# Patient Record
Sex: Male | Born: 1935 | Race: White | Hispanic: No | Smoking: Former smoker
Health system: Southern US, Community
[De-identification: ages and names within clinical notes are randomized; demographics above are authoritative.]

## PROBLEM LIST (undated history)

## (undated) DIAGNOSIS — H9190 Unspecified hearing loss, unspecified ear: Secondary | ICD-10-CM

## (undated) DIAGNOSIS — T182XXA Foreign body in stomach, initial encounter: Secondary | ICD-10-CM

## (undated) DIAGNOSIS — K219 Gastro-esophageal reflux disease without esophagitis: Secondary | ICD-10-CM

## (undated) DIAGNOSIS — E43 Unspecified severe protein-calorie malnutrition: Secondary | ICD-10-CM

## (undated) DIAGNOSIS — I429 Cardiomyopathy, unspecified: Secondary | ICD-10-CM

## (undated) DIAGNOSIS — Z86010 Personal history of colonic polyps: Secondary | ICD-10-CM

## (undated) DIAGNOSIS — I4891 Unspecified atrial fibrillation: Secondary | ICD-10-CM

## (undated) DIAGNOSIS — G250 Essential tremor: Secondary | ICD-10-CM

## (undated) DIAGNOSIS — K2990 Gastroduodenitis, unspecified, without bleeding: Secondary | ICD-10-CM

## (undated) DIAGNOSIS — K5730 Diverticulosis of large intestine without perforation or abscess without bleeding: Secondary | ICD-10-CM

## (undated) DIAGNOSIS — I428 Other cardiomyopathies: Secondary | ICD-10-CM

## (undated) DIAGNOSIS — E785 Hyperlipidemia, unspecified: Secondary | ICD-10-CM

## (undated) DIAGNOSIS — R198 Other specified symptoms and signs involving the digestive system and abdomen: Secondary | ICD-10-CM

## (undated) DIAGNOSIS — I10 Essential (primary) hypertension: Secondary | ICD-10-CM

## (undated) DIAGNOSIS — K561 Intussusception: Secondary | ICD-10-CM

## (undated) DIAGNOSIS — G252 Other specified forms of tremor: Secondary | ICD-10-CM

## (undated) DIAGNOSIS — N182 Chronic kidney disease, stage 2 (mild): Secondary | ICD-10-CM

## (undated) DIAGNOSIS — K2970 Gastritis, unspecified, without bleeding: Secondary | ICD-10-CM

## (undated) DIAGNOSIS — Z5189 Encounter for other specified aftercare: Secondary | ICD-10-CM

## (undated) DIAGNOSIS — K589 Irritable bowel syndrome without diarrhea: Secondary | ICD-10-CM

## (undated) DIAGNOSIS — R197 Diarrhea, unspecified: Secondary | ICD-10-CM

## (undated) DIAGNOSIS — Z87891 Personal history of nicotine dependence: Secondary | ICD-10-CM

## (undated) DIAGNOSIS — K2270 Barrett's esophagus without dysplasia: Secondary | ICD-10-CM

## (undated) DIAGNOSIS — I5042 Chronic combined systolic (congestive) and diastolic (congestive) heart failure: Secondary | ICD-10-CM

## (undated) DIAGNOSIS — K449 Diaphragmatic hernia without obstruction or gangrene: Principal | ICD-10-CM

## (undated) DIAGNOSIS — K921 Melena: Secondary | ICD-10-CM

## (undated) DIAGNOSIS — S32020A Wedge compression fracture of second lumbar vertebra, initial encounter for closed fracture: Secondary | ICD-10-CM

## (undated) DIAGNOSIS — E039 Hypothyroidism, unspecified: Secondary | ICD-10-CM

## (undated) DIAGNOSIS — J690 Pneumonitis due to inhalation of food and vomit: Secondary | ICD-10-CM

## (undated) DIAGNOSIS — E876 Hypokalemia: Secondary | ICD-10-CM

## (undated) DIAGNOSIS — B354 Tinea corporis: Secondary | ICD-10-CM

## (undated) HISTORY — DX: Cardiomyopathy, unspecified: I42.9

## (undated) HISTORY — DX: Hypomagnesemia: E83.42

## (undated) HISTORY — DX: Pneumonitis due to inhalation of food and vomit: J69.0

## (undated) HISTORY — DX: Unspecified severe protein-calorie malnutrition: E43

## (undated) HISTORY — DX: Melena: K92.1

## (undated) HISTORY — DX: Hypokalemia: E87.6

## (undated) HISTORY — DX: Other specified forms of tremor: G25.2

## (undated) HISTORY — DX: Diarrhea, unspecified: R19.7

## (undated) HISTORY — DX: Personal history of colonic polyps: Z86.010

## (undated) HISTORY — PX: HERNIA REPAIR: SHX51

## (undated) HISTORY — PX: TONSILLECTOMY: SUR1361

## (undated) HISTORY — DX: Other specified symptoms and signs involving the digestive system and abdomen: R19.8

## (undated) HISTORY — DX: Unspecified atrial fibrillation: I48.91

## (undated) HISTORY — DX: Tinea corporis: B35.4

## (undated) HISTORY — DX: Gastritis, unspecified, without bleeding: K29.70

## (undated) HISTORY — DX: Chronic kidney disease, stage 2 (mild): N18.2

## (undated) HISTORY — DX: Gastro-esophageal reflux disease without esophagitis: K21.9

## (undated) HISTORY — DX: Hyperlipidemia, unspecified: E78.5

## (undated) HISTORY — DX: Wedge compression fracture of second lumbar vertebra, initial encounter for closed fracture: S32.020A

## (undated) HISTORY — DX: Diaphragmatic hernia without obstruction or gangrene: K44.9

## (undated) HISTORY — DX: Encounter for other specified aftercare: Z51.89

## (undated) HISTORY — DX: Intussusception: K56.1

## (undated) HISTORY — DX: Irritable bowel syndrome, unspecified: K58.9

## (undated) HISTORY — DX: Gastroduodenitis, unspecified, without bleeding: K29.90

## (undated) HISTORY — DX: Essential (primary) hypertension: I10

## (undated) HISTORY — DX: Essential tremor: G25.0

## (undated) HISTORY — DX: Diverticulosis of large intestine without perforation or abscess without bleeding: K57.30

## (undated) HISTORY — DX: Personal history of nicotine dependence: Z87.891

## (undated) HISTORY — PX: UMBILICAL HERNIA REPAIR: SHX196

## (undated) HISTORY — PX: CHOLECYSTECTOMY: SHX55

## (undated) HISTORY — DX: Unspecified hearing loss, unspecified ear: H91.90

## (undated) HISTORY — DX: Barrett's esophagus without dysplasia: K22.70

## (undated) HISTORY — PX: VASECTOMY: SHX75

## (undated) HISTORY — DX: Foreign body in stomach, initial encounter: T18.2XXA

## (undated) HISTORY — DX: Hypothyroidism, unspecified: E03.9

---

## 1982-03-26 HISTORY — PX: LUNG SURGERY: SHX703

## 1997-07-08 ENCOUNTER — Other Ambulatory Visit: Admission: RE | Admit: 1997-07-08 | Discharge: 1997-07-08 | Payer: Self-pay | Admitting: *Deleted

## 1997-10-14 ENCOUNTER — Other Ambulatory Visit: Admission: RE | Admit: 1997-10-14 | Discharge: 1997-10-14 | Payer: Self-pay | Admitting: *Deleted

## 1999-02-13 ENCOUNTER — Other Ambulatory Visit: Admission: RE | Admit: 1999-02-13 | Discharge: 1999-02-13 | Payer: Self-pay | Admitting: Gastroenterology

## 1999-02-13 ENCOUNTER — Encounter (INDEPENDENT_AMBULATORY_CARE_PROVIDER_SITE_OTHER): Payer: Self-pay

## 2001-08-25 ENCOUNTER — Ambulatory Visit (HOSPITAL_COMMUNITY): Admission: RE | Admit: 2001-08-25 | Discharge: 2001-08-25 | Payer: Self-pay | Admitting: *Deleted

## 2002-09-02 ENCOUNTER — Ambulatory Visit (HOSPITAL_COMMUNITY): Admission: RE | Admit: 2002-09-02 | Discharge: 2002-09-02 | Payer: Self-pay | Admitting: Family Medicine

## 2002-09-02 ENCOUNTER — Encounter: Payer: Self-pay | Admitting: Family Medicine

## 2004-02-24 ENCOUNTER — Ambulatory Visit: Payer: Self-pay | Admitting: Gastroenterology

## 2004-03-13 ENCOUNTER — Ambulatory Visit: Payer: Self-pay | Admitting: Gastroenterology

## 2004-11-09 ENCOUNTER — Ambulatory Visit: Payer: Self-pay | Admitting: Cardiology

## 2005-01-25 ENCOUNTER — Ambulatory Visit: Payer: Self-pay | Admitting: Gastroenterology

## 2005-03-02 ENCOUNTER — Ambulatory Visit: Payer: Self-pay | Admitting: Gastroenterology

## 2006-01-15 ENCOUNTER — Ambulatory Visit: Payer: Self-pay | Admitting: Gastroenterology

## 2006-02-18 ENCOUNTER — Ambulatory Visit: Payer: Self-pay | Admitting: Gastroenterology

## 2006-02-18 ENCOUNTER — Encounter (INDEPENDENT_AMBULATORY_CARE_PROVIDER_SITE_OTHER): Payer: Self-pay | Admitting: Specialist

## 2006-03-25 ENCOUNTER — Ambulatory Visit: Payer: Self-pay | Admitting: Gastroenterology

## 2006-08-01 ENCOUNTER — Inpatient Hospital Stay (HOSPITAL_COMMUNITY): Admission: EM | Admit: 2006-08-01 | Discharge: 2006-08-07 | Payer: Self-pay | Admitting: Family Medicine

## 2006-08-01 ENCOUNTER — Ambulatory Visit: Payer: Self-pay | Admitting: Family Medicine

## 2007-02-05 ENCOUNTER — Ambulatory Visit: Payer: Self-pay | Admitting: Gastroenterology

## 2007-02-05 ENCOUNTER — Encounter: Payer: Self-pay | Admitting: Gastroenterology

## 2007-10-16 ENCOUNTER — Telehealth: Payer: Self-pay | Admitting: Gastroenterology

## 2007-11-19 DIAGNOSIS — K2970 Gastritis, unspecified, without bleeding: Secondary | ICD-10-CM

## 2007-11-19 DIAGNOSIS — K449 Diaphragmatic hernia without obstruction or gangrene: Secondary | ICD-10-CM

## 2007-11-19 DIAGNOSIS — K2990 Gastroduodenitis, unspecified, without bleeding: Secondary | ICD-10-CM

## 2007-11-19 DIAGNOSIS — K2270 Barrett's esophagus without dysplasia: Secondary | ICD-10-CM

## 2007-11-19 DIAGNOSIS — K219 Gastro-esophageal reflux disease without esophagitis: Secondary | ICD-10-CM

## 2007-11-19 DIAGNOSIS — K5730 Diverticulosis of large intestine without perforation or abscess without bleeding: Secondary | ICD-10-CM

## 2007-11-19 HISTORY — DX: Diverticulosis of large intestine without perforation or abscess without bleeding: K57.30

## 2007-11-19 HISTORY — DX: Gastro-esophageal reflux disease without esophagitis: K21.9

## 2007-11-19 HISTORY — DX: Barrett's esophagus without dysplasia: K22.70

## 2007-11-19 HISTORY — DX: Gastritis, unspecified, without bleeding: K29.70

## 2007-11-19 HISTORY — DX: Diaphragmatic hernia without obstruction or gangrene: K44.9

## 2007-11-19 HISTORY — DX: Gastroduodenitis, unspecified, without bleeding: K29.90

## 2007-11-20 ENCOUNTER — Ambulatory Visit: Payer: Self-pay | Admitting: Gastroenterology

## 2008-02-09 ENCOUNTER — Ambulatory Visit: Payer: Self-pay | Admitting: Internal Medicine

## 2008-02-09 LAB — CONVERTED CEMR LAB
CO2: 30 meq/L (ref 19–32)
Chloride: 104 meq/L (ref 96–112)
Creatinine, Ser: 1.5 mg/dL (ref 0.4–1.5)
Glucose, Bld: 95 mg/dL (ref 70–99)
TSH: 2.11 microintl units/mL (ref 0.35–5.50)

## 2008-02-20 ENCOUNTER — Encounter: Payer: Self-pay | Admitting: Internal Medicine

## 2008-02-20 ENCOUNTER — Ambulatory Visit: Payer: Self-pay

## 2008-04-05 ENCOUNTER — Ambulatory Visit: Payer: Self-pay | Admitting: Internal Medicine

## 2008-04-05 ENCOUNTER — Ambulatory Visit: Payer: Self-pay

## 2008-04-05 LAB — CONVERTED CEMR LAB
AST: 19 units/L (ref 0–37)
Alkaline Phosphatase: 86 units/L (ref 39–117)
Basophils Absolute: 0.1 10*3/uL (ref 0.0–0.1)
Chloride: 109 meq/L (ref 96–112)
Eosinophils Absolute: 0.4 10*3/uL (ref 0.0–0.7)
Eosinophils Relative: 5.5 % — ABNORMAL HIGH (ref 0.0–5.0)
GFR calc non Af Amer: 61 mL/min
HDL: 28.9 mg/dL — ABNORMAL LOW (ref >39.0)
LDL Cholesterol: 97 mg/dL (ref 0–99)
Lymphocytes Relative: 22.2 % (ref 12.0–46.0)
MCHC: 34.4 g/dL (ref 30.0–36.0)
MCV: 94.7 fL (ref 78.0–100.0)
Neutrophils Relative %: 60.4 % (ref 43.0–77.0)
Platelets: 153 10*3/uL (ref 150–400)
Potassium: 4.6 meq/L (ref 3.5–5.1)
Total Bilirubin: 1 mg/dL (ref 0.3–1.2)
VLDL: 22 mg/dL (ref 0–40)
WBC: 7.9 10*3/uL (ref 4.5–10.5)

## 2008-05-06 ENCOUNTER — Ambulatory Visit: Payer: Self-pay | Admitting: Internal Medicine

## 2008-05-18 ENCOUNTER — Ambulatory Visit: Payer: Self-pay | Admitting: Internal Medicine

## 2008-05-18 ENCOUNTER — Ambulatory Visit: Payer: Self-pay

## 2008-05-18 LAB — CONVERTED CEMR LAB
BUN: 20 mg/dL (ref 6–23)
CO2: 31 meq/L (ref 19–32)
Chloride: 105 meq/L (ref 96–112)
Creatinine, Ser: 1.1 mg/dL (ref 0.4–1.5)
Glucose, Bld: 79 mg/dL (ref 70–99)

## 2008-07-01 ENCOUNTER — Ambulatory Visit (HOSPITAL_COMMUNITY): Admission: RE | Admit: 2008-07-01 | Discharge: 2008-07-02 | Payer: Self-pay | Admitting: Surgery

## 2008-07-01 ENCOUNTER — Ambulatory Visit: Payer: Self-pay | Admitting: Internal Medicine

## 2008-07-23 ENCOUNTER — Encounter: Payer: Self-pay | Admitting: Internal Medicine

## 2008-08-02 ENCOUNTER — Ambulatory Visit: Payer: Self-pay | Admitting: Internal Medicine

## 2008-08-02 DIAGNOSIS — I5022 Chronic systolic (congestive) heart failure: Secondary | ICD-10-CM

## 2008-08-10 ENCOUNTER — Ambulatory Visit: Payer: Self-pay | Admitting: Internal Medicine

## 2008-08-10 ENCOUNTER — Telehealth (INDEPENDENT_AMBULATORY_CARE_PROVIDER_SITE_OTHER): Payer: Self-pay | Admitting: *Deleted

## 2008-08-10 DIAGNOSIS — I5043 Acute on chronic combined systolic (congestive) and diastolic (congestive) heart failure: Secondary | ICD-10-CM | POA: Insufficient documentation

## 2008-08-11 ENCOUNTER — Encounter: Payer: Self-pay | Admitting: Internal Medicine

## 2008-08-11 LAB — CONVERTED CEMR LAB
CO2: 33 meq/L — ABNORMAL HIGH (ref 19–32)
Calcium: 9.6 mg/dL (ref 8.4–10.5)
Creatinine, Ser: 1.3 mg/dL (ref 0.4–1.5)
Glucose, Bld: 113 mg/dL — ABNORMAL HIGH (ref 70–99)
HDL: 28.3 mg/dL — ABNORMAL LOW (ref >39.00)
Pro B Natriuretic peptide (BNP): 45 pg/mL (ref 0.0–100.0)
Sodium: 143 meq/L (ref 135–145)
Total CHOL/HDL Ratio: 6

## 2008-08-12 ENCOUNTER — Ambulatory Visit: Payer: Self-pay | Admitting: Internal Medicine

## 2008-08-12 ENCOUNTER — Telehealth: Payer: Self-pay | Admitting: Internal Medicine

## 2008-08-13 LAB — CONVERTED CEMR LAB
Chloride: 103 meq/L (ref 96–112)
Creatinine, Ser: 1.3 mg/dL (ref 0.4–1.5)

## 2008-10-07 ENCOUNTER — Telehealth: Payer: Self-pay | Admitting: Internal Medicine

## 2008-10-07 ENCOUNTER — Ambulatory Visit: Payer: Self-pay | Admitting: Internal Medicine

## 2008-10-08 ENCOUNTER — Ambulatory Visit: Payer: Self-pay | Admitting: Internal Medicine

## 2008-10-11 ENCOUNTER — Telehealth (INDEPENDENT_AMBULATORY_CARE_PROVIDER_SITE_OTHER): Payer: Self-pay | Admitting: *Deleted

## 2008-10-15 ENCOUNTER — Encounter: Payer: Self-pay | Admitting: Internal Medicine

## 2008-10-15 LAB — CONVERTED CEMR LAB
AST: 15 units/L (ref 0–37)
Cholesterol: 122 mg/dL (ref 0–200)
VLDL: 16.6 mg/dL (ref 0.0–40.0)

## 2008-12-24 ENCOUNTER — Telehealth: Payer: Self-pay | Admitting: Internal Medicine

## 2008-12-27 ENCOUNTER — Ambulatory Visit: Payer: Self-pay | Admitting: Internal Medicine

## 2009-01-04 LAB — CONVERTED CEMR LAB
Albumin: 3.4 g/dL — ABNORMAL LOW (ref 3.5–5.2)
Alkaline Phosphatase: 108 units/L (ref 39–117)
HDL: 23.1 mg/dL — ABNORMAL LOW (ref >39.00)
LDL Cholesterol: 61 mg/dL (ref 0–99)
Total CHOL/HDL Ratio: 5
Triglycerides: 126 mg/dL (ref 0.0–149.0)

## 2009-01-05 ENCOUNTER — Telehealth: Payer: Self-pay | Admitting: Internal Medicine

## 2009-01-11 ENCOUNTER — Encounter: Payer: Self-pay | Admitting: Internal Medicine

## 2009-01-31 ENCOUNTER — Ambulatory Visit: Payer: Self-pay | Admitting: Internal Medicine

## 2009-01-31 ENCOUNTER — Telehealth: Payer: Self-pay | Admitting: Gastroenterology

## 2009-01-31 DIAGNOSIS — R197 Diarrhea, unspecified: Secondary | ICD-10-CM

## 2009-01-31 HISTORY — DX: Diarrhea, unspecified: R19.7

## 2009-02-01 ENCOUNTER — Ambulatory Visit: Payer: Self-pay | Admitting: Internal Medicine

## 2009-02-01 ENCOUNTER — Encounter: Payer: Self-pay | Admitting: Gastroenterology

## 2009-02-01 DIAGNOSIS — R198 Other specified symptoms and signs involving the digestive system and abdomen: Secondary | ICD-10-CM

## 2009-02-01 DIAGNOSIS — Z860100 Personal history of colon polyps, unspecified: Secondary | ICD-10-CM | POA: Insufficient documentation

## 2009-02-01 HISTORY — DX: Personal history of colonic polyps: Z86.010

## 2009-02-01 HISTORY — DX: Personal history of colon polyps, unspecified: Z86.0100

## 2009-02-01 HISTORY — DX: Other specified symptoms and signs involving the digestive system and abdomen: R19.8

## 2009-02-07 ENCOUNTER — Telehealth: Payer: Self-pay | Admitting: Nurse Practitioner

## 2009-02-21 ENCOUNTER — Ambulatory Visit: Payer: Self-pay | Admitting: Gastroenterology

## 2009-02-25 ENCOUNTER — Encounter: Payer: Self-pay | Admitting: Gastroenterology

## 2009-02-28 ENCOUNTER — Telehealth: Payer: Self-pay | Admitting: Gastroenterology

## 2009-03-01 ENCOUNTER — Ambulatory Visit: Payer: Self-pay | Admitting: Gastroenterology

## 2009-03-01 LAB — CONVERTED CEMR LAB
Ferritin: 19.1 ng/mL — ABNORMAL LOW (ref 22.0–322.0)
Folate: >20 ng/mL
Iron: 62 ug/dL (ref 42–165)
Vitamin B-12: 460 pg/mL (ref 211–911)

## 2009-03-14 ENCOUNTER — Telehealth: Payer: Self-pay | Admitting: Gastroenterology

## 2009-04-04 ENCOUNTER — Telehealth: Payer: Self-pay | Admitting: Internal Medicine

## 2009-04-13 ENCOUNTER — Telehealth: Payer: Self-pay | Admitting: Internal Medicine

## 2009-04-27 ENCOUNTER — Telehealth: Payer: Self-pay | Admitting: Gastroenterology

## 2009-05-03 ENCOUNTER — Encounter: Payer: Self-pay | Admitting: Internal Medicine

## 2009-05-03 LAB — CONVERTED CEMR LAB
ALT: 15 units/L
AST: 15 units/L
Alkaline Phosphatase: 117 units/L
CO2: 29 meq/L
Calcium: 9.5 mg/dL
Chloride: 101 meq/L
Creatinine, Ser: 1.24 mg/dL
Hgb A1c MFr Bld: 6.7 %
LDL Cholesterol: 99 mg/dL
PSA: 1 ng/mL
Potassium: 4.6 meq/L
Sodium: 139 meq/L
TSH: 1.8656 microintl units/mL
Total Bilirubin: 0.6 mg/dL
Total Protein: 6.5 g/dL

## 2009-06-27 ENCOUNTER — Ambulatory Visit: Payer: Self-pay | Admitting: Internal Medicine

## 2009-06-27 DIAGNOSIS — G252 Other specified forms of tremor: Secondary | ICD-10-CM

## 2009-06-27 DIAGNOSIS — E039 Hypothyroidism, unspecified: Secondary | ICD-10-CM | POA: Insufficient documentation

## 2009-06-27 DIAGNOSIS — E785 Hyperlipidemia, unspecified: Secondary | ICD-10-CM | POA: Insufficient documentation

## 2009-06-27 DIAGNOSIS — G250 Essential tremor: Secondary | ICD-10-CM

## 2009-06-27 DIAGNOSIS — B354 Tinea corporis: Secondary | ICD-10-CM

## 2009-06-27 DIAGNOSIS — I10 Essential (primary) hypertension: Secondary | ICD-10-CM | POA: Insufficient documentation

## 2009-06-27 DIAGNOSIS — Z87891 Personal history of nicotine dependence: Secondary | ICD-10-CM

## 2009-06-27 HISTORY — DX: Tinea corporis: B35.4

## 2009-06-27 HISTORY — DX: Personal history of nicotine dependence: Z87.891

## 2009-06-27 HISTORY — DX: Hyperlipidemia, unspecified: E78.5

## 2009-06-27 HISTORY — DX: Essential tremor: G25.0

## 2009-06-27 HISTORY — DX: Essential tremor: G25.2

## 2009-06-27 HISTORY — DX: Hypothyroidism, unspecified: E03.9

## 2009-06-29 ENCOUNTER — Telehealth: Payer: Self-pay | Admitting: Gastroenterology

## 2009-07-19 ENCOUNTER — Ambulatory Visit: Payer: Self-pay | Admitting: Internal Medicine

## 2009-08-01 ENCOUNTER — Ambulatory Visit: Payer: Self-pay | Admitting: Internal Medicine

## 2009-08-01 DIAGNOSIS — K921 Melena: Secondary | ICD-10-CM

## 2009-08-01 HISTORY — DX: Melena: K92.1

## 2009-08-02 ENCOUNTER — Emergency Department (HOSPITAL_COMMUNITY): Admission: EM | Admit: 2009-08-02 | Discharge: 2009-08-02 | Payer: Self-pay | Admitting: Emergency Medicine

## 2009-08-02 LAB — CONVERTED CEMR LAB
BUN: 25 mg/dL — ABNORMAL HIGH (ref 6–23)
Basophils Absolute: 0 10*3/uL (ref 0.0–0.1)
Basophils Relative: 0.6 % (ref 0.0–3.0)
CO2: 26 meq/L (ref 19–32)
Chloride: 102 meq/L (ref 96–112)
Eosinophils Absolute: 0.1 10*3/uL (ref 0.0–0.7)
Lymphocytes Relative: 18.8 % (ref 12.0–46.0)
MCHC: 34.3 g/dL (ref 30.0–36.0)
Monocytes Relative: 9.7 % (ref 3.0–12.0)
Neutrophils Relative %: 69.3 % (ref 43.0–77.0)
Potassium: 5.1 meq/L (ref 3.5–5.1)
RBC: 4.67 M/uL (ref 4.22–5.81)

## 2009-08-04 ENCOUNTER — Ambulatory Visit: Payer: Self-pay | Admitting: Gastroenterology

## 2009-08-04 LAB — CONVERTED CEMR LAB
Ferritin: 22.5 ng/mL (ref 22.0–322.0)
Iron: 93 ug/dL (ref 42–165)
Transferrin: 244.6 mg/dL (ref 212.0–360.0)
Vitamin B-12: 1285 pg/mL — ABNORMAL HIGH (ref 211–911)

## 2009-08-09 ENCOUNTER — Encounter: Payer: Self-pay | Admitting: Gastroenterology

## 2009-08-09 LAB — CONVERTED CEMR LAB: Metaneph Total, Ur: 392 ug/24hr (ref 224–832)

## 2009-08-13 ENCOUNTER — Telehealth: Payer: Self-pay | Admitting: Gastroenterology

## 2009-08-15 ENCOUNTER — Ambulatory Visit: Payer: Self-pay | Admitting: Internal Medicine

## 2009-08-16 ENCOUNTER — Ambulatory Visit: Payer: Self-pay | Admitting: Gastroenterology

## 2009-08-18 ENCOUNTER — Encounter: Payer: Self-pay | Admitting: Gastroenterology

## 2009-08-18 ENCOUNTER — Ambulatory Visit: Payer: Self-pay | Admitting: Gastroenterology

## 2009-08-19 ENCOUNTER — Telehealth: Payer: Self-pay | Admitting: Gastroenterology

## 2009-08-26 ENCOUNTER — Telehealth: Payer: Self-pay | Admitting: Internal Medicine

## 2009-08-26 ENCOUNTER — Telehealth: Payer: Self-pay | Admitting: Gastroenterology

## 2009-09-13 ENCOUNTER — Encounter: Payer: Self-pay | Admitting: Gastroenterology

## 2009-09-13 ENCOUNTER — Encounter (HOSPITAL_COMMUNITY): Admission: RE | Admit: 2009-09-13 | Discharge: 2009-12-12 | Payer: Self-pay | Admitting: Gastroenterology

## 2009-09-13 ENCOUNTER — Encounter: Payer: Self-pay | Admitting: Internal Medicine

## 2009-09-14 ENCOUNTER — Encounter: Payer: Self-pay | Admitting: Internal Medicine

## 2009-09-15 ENCOUNTER — Telehealth: Payer: Self-pay | Admitting: Gastroenterology

## 2009-09-19 ENCOUNTER — Ambulatory Visit: Payer: Self-pay | Admitting: Internal Medicine

## 2009-09-19 LAB — CONVERTED CEMR LAB
CO2: 30 meq/L (ref 19–32)
Chloride: 103 meq/L (ref 96–112)
Sodium: 141 meq/L (ref 135–145)

## 2009-09-23 ENCOUNTER — Ambulatory Visit: Payer: Self-pay | Admitting: Gastroenterology

## 2009-09-23 ENCOUNTER — Telehealth: Payer: Self-pay | Admitting: Gastroenterology

## 2009-09-23 LAB — CONVERTED CEMR LAB
ALT: 20 units/L (ref 0–53)
AST: 17 units/L (ref 0–37)
Alkaline Phosphatase: 101 units/L (ref 39–117)
Bilirubin, Direct: 0.1 mg/dL (ref 0.0–0.3)
Total Protein: 6.7 g/dL (ref 6.0–8.3)

## 2009-09-29 ENCOUNTER — Telehealth: Payer: Self-pay | Admitting: Gastroenterology

## 2009-10-06 ENCOUNTER — Ambulatory Visit: Payer: Self-pay | Admitting: Internal Medicine

## 2009-10-06 LAB — CONVERTED CEMR LAB
BUN: 24 mg/dL — ABNORMAL HIGH (ref 6–23)
Creatinine, Ser: 1.4 mg/dL (ref 0.4–1.5)
GFR calc non Af Amer: 49.84 mL/min (ref >60)
TSH: 0.96 microintl units/mL (ref 0.35–5.50)

## 2009-10-10 ENCOUNTER — Ambulatory Visit: Payer: Self-pay | Admitting: Internal Medicine

## 2009-10-12 ENCOUNTER — Encounter (INDEPENDENT_AMBULATORY_CARE_PROVIDER_SITE_OTHER): Payer: Self-pay | Admitting: *Deleted

## 2009-10-26 ENCOUNTER — Encounter: Payer: Self-pay | Admitting: Gastroenterology

## 2009-10-26 ENCOUNTER — Encounter: Payer: Self-pay | Admitting: Internal Medicine

## 2009-10-28 ENCOUNTER — Telehealth: Payer: Self-pay | Admitting: Internal Medicine

## 2009-11-23 ENCOUNTER — Encounter: Payer: Self-pay | Admitting: Gastroenterology

## 2009-11-30 ENCOUNTER — Encounter: Payer: Self-pay | Admitting: Internal Medicine

## 2009-12-21 ENCOUNTER — Encounter: Payer: Self-pay | Admitting: Gastroenterology

## 2010-01-12 ENCOUNTER — Telehealth: Payer: Self-pay | Admitting: Internal Medicine

## 2010-02-15 ENCOUNTER — Encounter: Payer: Self-pay | Admitting: Gastroenterology

## 2010-04-05 ENCOUNTER — Encounter: Payer: Self-pay | Admitting: Internal Medicine

## 2010-04-27 ENCOUNTER — Encounter: Payer: Self-pay | Admitting: Internal Medicine

## 2010-04-27 ENCOUNTER — Ambulatory Visit (INDEPENDENT_AMBULATORY_CARE_PROVIDER_SITE_OTHER): Payer: Medicare Other | Admitting: Internal Medicine

## 2010-04-27 ENCOUNTER — Ambulatory Visit: Admit: 2010-04-27 | Payer: Self-pay | Admitting: Internal Medicine

## 2010-04-27 DIAGNOSIS — E7800 Pure hypercholesterolemia, unspecified: Secondary | ICD-10-CM

## 2010-04-27 DIAGNOSIS — I509 Heart failure, unspecified: Secondary | ICD-10-CM

## 2010-04-27 DIAGNOSIS — I10 Essential (primary) hypertension: Secondary | ICD-10-CM

## 2010-04-27 NOTE — Procedures (Signed)
Summary: Capsule Endoscopy   Capsule Endoscopy  Procedure date:  08/18/2009  Findings:      Performing Location:  GI   Ordering Physician: Sheryn Bison, MD  Report created/read by: Claudette Head, MD  Reason for Referral:  77 y/o male with chronic diarrhea, intermittent melena  Procedure Information and Findings:  1) Incomplete study- Capsule did not reach the colon 2) multiple tiny red spots scattered which could represent minutes AVM's 3) one smooth rounded submucosal nodule at 3 hours 54 minutes 4) mild inflammatory changes with shaggy appearance of villi and mild villous erosion in segment at 5 hours 14 minutes.  Summary and Recommendations:  Per Dr Jarold Motto.  The submucosal nodule could concievably represent a carcinoid, and with chronic diarrhea would pursue further evaluation to r/o carcinoid.    This report was created from the original report, which was reviewed and signed by the above listed reading physician.

## 2010-04-27 NOTE — Progress Notes (Signed)
Summary: hospital form   Phone Note Call from Patient Call back at Home Phone [phone]   Caller: Patient Call For: Dr. Jarold Motto Reason for Call: Talk to Nurse Summary of Call: needs help with filling out a form from [facility] Initial call taken by: Vallarie Mare,  September 29, 2009 1:07 PM  Follow-up for Phone Call        Pt has received paper work for his appt [facility].  Pt asks if we can look over papers for him.  He dose not understand some of the questions asked.  He will bring by papers for nurse to look over. Follow-up by: Ashok Cordia RN,  September 29, 2009 3:15 PM  Additional Follow-up for Phone Call Additional follow up Details #1::        [facility] pt questionaire form reviewed.  Dates of proc filled out for pt.  Form at front desk for pt to pick up.  No answer at pt's home number. Additional Follow-up by: Ashok Cordia RN,  October 03, 2009 12:25 PM    Additional Follow-up for Phone Call Additional follow up Details #2::    Pt notified, form ready to be picked up. Follow-up by: Ashok Cordia RN,  October 03, 2009 3:06 PM

## 2010-04-27 NOTE — Progress Notes (Signed)
Summary: rx changed  Medications Added PANTOPRAZOLE SODIUM 40 MG TBEC (PANTOPRAZOLE SODIUM) 1 by mouth once daily       Phone Note Call from Patient Call back at Home Phone [phone]   Caller: Patient Call For: Jarold Motto Reason for Call: Talk to Nurse Summary of Call: Patient wants rx changed from Nexium to generic Initial call taken by: Tawni Levy,  June 29, 2009 11:09 AM  Follow-up for Phone Call        Pt states he has changed insurance plans and he needs to be on generic meds if possible.  Nexium changed to pantoprazole.  Pt req rx be sent to [facility]. Follow-up by: Ashok Cordia RN,  June 29, 2009 12:17 PM    New/Updated Medications: PANTOPRAZOLE SODIUM 40 MG TBEC (PANTOPRAZOLE SODIUM) 1 by mouth once daily Prescriptions: PANTOPRAZOLE SODIUM 40 MG TBEC (PANTOPRAZOLE SODIUM) 1 by mouth once daily  #90 x 3   Entered by:   Ashok Cordia RN   Authorized by:   Mardella Layman MD [facility]   Signed by:   Ashok Cordia RN on 06/29/2009   Method used:   Electronically to        [facility] (mail-order)             ,          Ph: [phone]       Fax: [phone]   RxID:   [id number]

## 2010-04-27 NOTE — Progress Notes (Signed)
Summary: Referral  Phone Note Call from Patient Call back at Home Phone [phone]   Caller: Patient Summary of Call: Pt called requesting referral to [facility] Initial call taken by: Margaret Pyle, CMA,  October 28, 2009 11:37 AM  Follow-up for Phone Call        ok - done Follow-up by: Newt Lukes MD,  October 28, 2009 11:57 AM  Additional Follow-up for Phone Call Additional follow up Details #1::        Pt informed and will expect a call from [facility] with appt info. Additional Follow-up by: Margaret Pyle, CMA,  October 28, 2009 12:07 PM

## 2010-04-27 NOTE — Progress Notes (Signed)
Summary: pt needs PCP wants [place] pls advise   Phone Note Call from Patient Call back at Home Phone [phone]   Caller: Patient Reason for Call: Talk to Nurse Summary of Call: pt is switching insurance and he wants a ref to Primary care MD on [address] Initial call taken by: Omer Jack,  April 04, 2009 10:41 AM  Follow-up for Phone Call        [facility] for call back. Follow-up by: Suzan Garibaldi RN  Additional Follow-up for Phone Call Additional follow up Details #1::        Rene Paci if she is taking medicare Additional Follow-up by: Sherrill Raring, MD, [facility],  April 13, 2009 8:25 AM     Appended Document: pt needs PCP wants [place] pls advise Set up with Dr.Valerie Leschber...patient aware.

## 2010-04-27 NOTE — Letter (Signed)
Summary: [facility]  [facility]   Imported By: Sherian Rein 01/10/2010 11:12:16  _____________________________________________________________________  External Attachment:    Type:   Image     Comment:   External Document

## 2010-04-27 NOTE — Progress Notes (Signed)
Summary: FYI: changing PCP  Phone Note Call from Patient   Caller: Patient Summary of Call: FYI:  pt states he is changing PCP to Dr. Mila Homer on [address] because he was dissatisfied with his care here. Also, Dr. Perrin Maltese is now accepting his insurance.  Initial call taken by: Lanier Prude, [facility]),  January 12, 2010 1:19 PM  Follow-up for Phone Call        ok - we can forward records when requested - thanks Follow-up by: Newt Lukes MD,  January 13, 2010 8:25 AM

## 2010-04-27 NOTE — Progress Notes (Signed)
Summary: Nexium refill   Phone Note Outgoing Call   Summary of Call: Refill requested on Nexium 40 mg #90 form [facility]. Initial call taken by: Ashok Cordia RN,  April 27, 2009 11:53 AM    Prescriptions: NEXIUM 40 MG  CPDR (ESOMEPRAZOLE MAGNESIUM) 1 capsule each day 30 minutes before meal  #90 x 3   Entered by:   Ashok Cordia RN   Authorized by:   Mardella Layman MD [facility]   Signed by:   Ashok Cordia RN on 04/27/2009   Method used:   Electronically to        [facility]* (mail-order)             ,          Ph: [phone]       Fax: [phone]   RxID:   [id number]

## 2010-04-27 NOTE — Assessment & Plan Note (Signed)
Summary: F/U xray, capsule endo/dfs    History of Present Illness Visit Type: Follow-up Visit Primary GI MD: Sheryn Bison MD FACP FAGA Primary Provider: Newt Lukes MD Requesting Provider: na Chief Complaint: F/u from x-ray and capsule endo . Pt states that he feels very good and denies any GI complaints  History of Present Illness:   [name] is a 77 year old Caucasian male followed for many years for IBS and acid reflux problems. He recently has had abdominal cramping, diarrhea, and progressive weight loss. He has mildly elevated 24-hour urine 5-HIAA A. a levels and a markedly elevated serum serotonin level. Small bowel capsule exam suggested a small small bowel nodule, but small bowel series and octreotide scans were unremarkable. He has no history of abnormal liver function test, hepatitis, or pancreatitis. He continued with abdominal cramping, watery diarrhea, mild anorexia and a 10 pound weight loss. 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 He is on multiple medications including pantoprazole, trial of digestive enzymes, probiotics, and Periactin 4 mg t.i.d. without improvement. He denies any specific food intolerances.  He was recently found to be hypokalemic and is on potassium replacement, B12 and multivitamins.   GI Review of Systems      Denies abdominal pain, acid reflux, belching, bloating, chest pain, dysphagia with liquids, dysphagia with solids, heartburn, loss of appetite, nausea, vomiting, vomiting blood, weight loss, and  weight gain.        Denies anal fissure, black tarry stools, change in bowel habit, constipation, diarrhea, diverticulosis, fecal incontinence, heme positive stool, hemorrhoids, irritable bowel syndrome, jaundice, light color stool, liver problems, rectal bleeding, and  rectal pain.    Current Medications (verified): 1)  Pantoprazole Sodium 40 Mg Tbec  (Pantoprazole Sodium) .Marland Kitchen.. 1 By Mouth Once Daily 2)  Armour Thyroid 60 Mg Tabs (Thyroid) .Marland Kitchen.. 1 Capsule Once Daily 3)  Lisinopril-Hydrochlorothiazide 10-12.5 Mg Tabs (Lisinopril-Hydrochlorothiazide) .Marland Kitchen.. 1 Tab Once Daily 4)  Multivitamins  Tabs (Multiple Vitamin) .... Take 1 Tablet By Mouth Once A Day 5)  Zocor 20 Mg Tabs (Simvastatin) .... Take One Tabled By Mouth Once A Day 6)  Flomax 0.4 Mg Xr24h-Cap (Tamsulosin Hcl) .Marland Kitchen.. 1 Tab Once Daily 7)  Fish Oil   Oil (Fish Oil) .Marland Kitchen.. 1 Tab Once Daily 8)  Niaspan 500 Mg Cr-Tabs (Niacin (Antihyperlipidemic)) .Marland Kitchen.. 1 By Mouth Every Day 9)  B-12 (Unknown Dosage) .... Take 1 Tablet By Mouth Once A Day 10)  Vitamin D (Unknown Dosage) .... Take 1 Tablet By Mouth Once A Day 11)  Bilberry 280mg  .... 1 Tab Once Daily 12)  Pure Encapsulations Vitamin .Marland Kitchen.. 1 Daily 13)  Coqmax Dietary Supplement .Marland Kitchen.. 1 Tab Once Daily 14)  Digestzymes .... Take 1 Tablet By Mouth Once A Day 15)  Soulbee Fiber Formular Vitamin .Marland Kitchen.. 1 Daily 16)  Testosterone Cypionate 200 Mg/ml Oil (Testosterone Cypionate) .... Take 1  Injection Q  Week 17)  Zenpep 20000 Unit Cpep (Pancrelipase (Lip-Prot-Amyl)) .Marland Kitchen.. 1 Three Times A Day With Meals 18)  Metoprolol Succinate 25 Mg Xr24h-Tab (Metoprolol Succinate) .Marland Kitchen.. 1 Tab Every Day 19)  Periactin 4 Mg .Marland Kitchen.. 1 By Mouth Three Times A Day 20)  Potassium Chloride 20 Meq Pack (Potassium Chloride) .... Take 2 Tabs By Mouth Today Only.then  Take One Tablet By Mouth  Daily Thereafter.  Allergies (verified): 1)  ! Cipro  Past History:  Past medical, surgical, family and social histories (including risk factors) reviewed for relevance  to current acute and chronic problems.  Past Medical History: Reviewed history from 07/19/2009 and no changes required. Cardiomyopathy  Hyperlipidemia Hypertension IBS hypothyroid GERD with HH hx diverticulosis colon  MD rooster: cards - [facility] - [facility] uro - [facility]  endo - [facility]  Past Surgical  History: Reviewed history from 06/27/2009 and no changes required. Cholecystectomy lung surgery 1984 Umbilical Hernia Repair Tonsillectomy  Family History: Reviewed history from 06/27/2009 and no changes required. Family History of Prostate Cancer:brother Family History of Heart Disease: mother Family History of Colon Cancer:father Family History of Colon Polyps:Father - died age 24  Social History: Reviewed history from 06/27/2009 and no changes required. 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 -stopped in 1964 Alcohol Use - yes-occasional Illicit Drug Use - no Patient gets regular exercise.-walk 3 miles daily  Review of Systems  The patient denies allergy/sinus, anemia, anxiety-new, arthritis/joint pain, back pain, blood in urine, breast changes/lumps, change in vision, confusion, cough, coughing up blood, depression-new, fainting, fatigue, fever, headaches-new, hearing problems, heart murmur, heart rhythm changes, itching, menstrual pain, muscle pains/cramps, night sweats, nosebleeds, pregnancy symptoms, shortness of breath, skin rash, sleeping problems, sore throat, swelling of feet/legs, swollen lymph glands, thirst - excessive , urination - excessive , urination changes/pain, urine leakage, vision changes, and voice change.    Vital Signs:  Patient profile:   77 year old male Height:      67 inches Weight:      148 pounds BMI:     23.26 BSA:     1.78 Temp:     98.2 degrees F oral Pulse rate:   76 / minute Pulse rhythm:   regular BP sitting:   128 / 64  (left arm) Cuff size:   regular  Vitals Entered By: [name] CMA (September 23, 2009 11:26 AM)  Physical Exam  General:  Well developed, well nourished, no acute distress.healthy appearing.   Head:  Normocephalic and atraumatic. Eyes:  PERRLA, no icterus. Lungs:  decreased BS on L and decreased BS on R.   Heart:  Regular rate and rhythm; no murmurs, rubs,  or  bruits. Abdomen:  Soft, nontender and nondistended. No masses, hepatosplenomegaly or hernias noted. Normal bowel sounds. Msk:  Symmetrical with no gross deformities. Normal posture. Extremities:  No clubbing, cyanosis, edema or deformities noted. Neurologic:  Alert and  oriented x4;  grossly normal neurologically. Psych:  Alert and cooperative. Normal mood and affect.   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 He may benefit from small bowel enteroscopy, perhaps surgical laparotomy, or continue cautious observation. However, I'm concerned about his continued diarrhea and weight loss. I have added Colestid 1 g twice a day to his regime with p.r.n. Imodium use pending second opinion from Dr.Koch at [facility] gastroenterology. I have ordered chest x-ray, gastrin level, and liver function tests. Orders: TLB-Hepatic/Liver Function Pnl ([id number]-[id number]) T-Acute Abdomen (2 view w/ PA & Chest ([id number]) T-Gastrin, RIA [phone])  Problem # 2:  MELENA (ICD-578.1) Assessment: Improved No further reported episodes of melena as per previous history. The patient denies abuse of alcohol, cigarettes, or NSAIDs.  Problem # 3:  HYPERTENSION (ICD-401.9) Assessment: Improved blood pressure today 128/64 and is to continue all of his medications as per cardiology.  Problem # 4:  CARDIOMYOPATHY (ICD-425.4) Assessment: Improved  Problem #  5:  GERD (ICD-530.81) Assessment: Improved Continue pantoprazole 40 mg a day. Serum gastrin level ordered.  Patient Instructions: 1)  Get your labs drawn today in the basement and then go directly to radiology in the basement.  2)  Pick up your prescription from your pharmacy.  3)  Use Imodium as needed. 4)  I will contact you about the referral to a tertiary clinic for 2nd opinion.  5)  Copy  sent to : Newt Lukes MD and Dr. Huston Foley and cardiology 6)  The medication list was reviewed and reconciled.  All changed / newly prescribed medications were explained.  A complete medication list was provided to the patient / caregiver. Prescriptions: COLESTID 1 GM TABS (COLESTIPOL HCL) one tablet by mouth once daily  #30 x 11   Entered by:   Christie Nottingham CMA (AAMA)   Authorized by:   Mardella Layman MD [facility]   Signed by:   Christie Nottingham CMA (AAMA) on 09/23/2009   Method used:   Electronically to        [facility]. [phone]* (retail)       [address]       [place], [place]  [address]       Ph: [phone]       Fax: [phone]   RxID:   [id number]   Appended Document: Orders Update    Clinical Lists Changes  Orders: Added new Test order of [facility]) - Signed

## 2010-04-27 NOTE — Procedures (Signed)
Summary: 578.1/dfs  Patient here today for capsule endoscopy for Dr. Jarold Motto .  Pt verbalized understanding of all verbal and written instructions.  Pt tolerated well.  Lot #  [id number]  exp 2012-07 .

## 2010-04-27 NOTE — Miscellaneous (Signed)
Summary: Orders Update  Clinical Lists Changes  Medications: Added new medication of POTASSIUM CHLORIDE 20 MEQ PACK (POTASSIUM CHLORIDE) take 2 tabs by mouth today only.Then  take one tablet by mouth  daily thereafter. - Signed Rx of POTASSIUM CHLORIDE 20 MEQ PACK (POTASSIUM CHLORIDE) take 2 tabs by mouth today only.Then  take one tablet by mouth  daily thereafter.;  #30 x 6;  Signed;  Entered by: Ollen Gross, RN, BSN;  Authorized by: Sherrill Raring, MD, [facility];  Method used: Electronically to [facility]. [phone]*, [address] [place], [place], [place]  [address], Ph: [phone], Fax: [phone]    Prescriptions: POTASSIUM CHLORIDE 20 MEQ PACK (POTASSIUM CHLORIDE) take 2 tabs by mouth today only.Then  take one tablet by mouth  daily thereafter.  #30 x 6   Entered by:   Ollen Gross, RN, BSN   Authorized by:   Sherrill Raring, MD, [facility]   Signed by:   Ollen Gross, RN, BSN on 09/14/2009   Method used:   Electronically to        [facility]. [phone]* (retail)       [address]       [place], [place]  [address]       Ph: [phone]       Fax: [phone]   RxID:   [id number]

## 2010-04-27 NOTE — Progress Notes (Signed)
   Phone Note Call from Patient   Caller: Patient Summary of Call: On call note @1030 . Pt has ongoing diarrhea with flecks of black material. No new symptoms, no change in diarrhea. No fevers, chills, N/V, good appetite, otherwise feels well. He is concerned that he had not received a call [facility] to schedule a CE study. I will notify Dr. Norval Gable nurse. Initial call taken by: Meryl Dare MD Clementeen Graham,  Aug 13, 2009 11:14 AM

## 2010-04-27 NOTE — Assessment & Plan Note (Signed)
Summary: melena x3 days--ch.    History of Present Illness Visit Type: Follow-up Visit Primary GI MD: Sheryn Bison MD FACP FAGA Primary Provider: Newt Lukes MD Requesting Provider: Dietrich Pates, MD Chief Complaint: Melena x 3 days History of Present Illness:   Complicated 77 year old Caucasian male that I followed for many years for acid reflux and irritable bowel syndrome. For the last several months he's had gas, bloating, diarrhea, and symptoms suggestive of malabsorption but his workup has been entirely negative including her lab data, stool exams, colonoscopy with random colon biopsies, and endoscopy with small bowel biopsy.  He does have known Barrett's because of Barrett's mucosa from chronic acid reflux, and is on daily pantoprazole 40 mg.He denies reflux symptoms or dysphasia this time. 5 days ago he had onset of melena and stopped most of his medications. He repeatedly denies use of NSAIDs or salicylates. He was seen her this week in primary care and had a normal CBC but did have guaiac positive stool. He is referred to GI for followup. His stools currently of normal color, and he denies the symptoms of hypovolemia such as palpitations, chest pain, shortness of breath dizziness etc. he is on multiple medicines for hypertension, chronic hypothyroidism, and hypogonadism. He denies abuse of alcohol or cigarettes. In fact, he works out regularly at [employer] and is very fit. He is status post cholecystectomy and also repair of an umbilical hernia.   GI Review of Systems      Denies abdominal pain, acid reflux, belching, bloating, chest pain, dysphagia with liquids, dysphagia with solids, heartburn, loss of appetite, nausea, vomiting, vomiting blood, weight loss, and  weight gain.      Reports black tarry stools.     Denies anal fissure, change in bowel habit, constipation, diarrhea, diverticulosis, fecal incontinence, heme positive stool, hemorrhoids, irritable bowel syndrome,  jaundice, light color stool, liver problems, rectal bleeding, and  rectal pain.    Current Medications (verified): 1)  Pantoprazole Sodium 40 Mg Tbec (Pantoprazole Sodium) .Marland Kitchen.. 1 By Mouth Once Daily 2)  Armour Thyroid 60 Mg Tabs (Thyroid) .Marland Kitchen.. 1 Capsule Once Daily 3)  Lisinopril-Hydrochlorothiazide 10-12.5 Mg Tabs (Lisinopril-Hydrochlorothiazide) .Marland Kitchen.. 1 Tab Once Daily 4)  Multivitamins  Tabs (Multiple Vitamin) .... Take 1 Tablet By Mouth Once A Day 5)  Zocor 20 Mg Tabs (Simvastatin) .... Take One Tabled By Mouth Once A Day 6)  Flomax 0.4 Mg Xr24h-Cap (Tamsulosin Hcl) .Marland Kitchen.. 1 Tab Once Daily 7)  Fish Oil   Oil (Fish Oil) .Marland Kitchen.. 1 Tab Once Daily 8)  Niaspan 500 Mg Cr-Tabs (Niacin (Antihyperlipidemic)) .Marland Kitchen.. 1 By Mouth Every Day 9)  B-12 (Unknown Dosage) .... Take 1 Tablet By Mouth Once A Day 10)  Vitamin D (Unknown Dosage) .... Take 1 Tablet By Mouth Once A Day 11)  Bilberry 280mg  .... 1 Tab Once Daily 12)  Pure Encapsulations Vitamin .Marland Kitchen.. 1 Daily 13)  Coqmax Dietary Supplement .Marland Kitchen.. 1 Tab Once Daily 14)  Digestzymes .... Take 1 Tablet By Mouth Once A Day 15)  Soulbee Fiber Formular Vitamin .Marland Kitchen.. 1 Daily 16)  Testosterone Cypionate 200 Mg/ml Oil (Testosterone Cypionate) .... Take 1  Injection Q  Week  Allergies (verified): 1)  ! Cipro  Past History:  Past medical, surgical, family and social histories (including risk factors) reviewed for relevance to current acute and chronic problems.  Past Medical History: Reviewed history from 07/19/2009 and no changes required. Cardiomyopathy  Hyperlipidemia Hypertension IBS hypothyroid GERD with HH hx diverticulosis colon  MD rooster:  cards - ross GI - Rollan Roger uro - wrenn  endo - watkins  Past Surgical History: Reviewed history from 06/27/2009 and no changes required. Cholecystectomy lung surgery 1984 Umbilical Hernia Repair Tonsillectomy  Family History: Reviewed history from 06/27/2009 and no changes required. Family History of  Prostate Cancer:brother Family History of Heart Disease: mother Family History of Colon Cancer:father Family History of Colon Polyps:Father - died age 57  Social History: Reviewed history from 06/27/2009 and no changes required. 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 -stopped in 1964 Alcohol Use - yes-occasional Illicit Drug Use - no Patient gets regular exercise.-walk 3 miles daily  Review of Systems       The patient complains of cough.  The patient denies allergy/sinus, anemia, anxiety-new, arthritis/joint pain, back pain, blood in urine, breast changes/lumps, change in vision, confusion, coughing up blood, depression-new, fainting, fatigue, fever, headaches-new, hearing problems, heart murmur, heart rhythm changes, itching, muscle pains/cramps, night sweats, nosebleeds, shortness of breath, skin rash, sleeping problems, sore throat, swelling of feet/legs, swollen lymph glands, thirst - excessive, urination - excessive, urination changes/pain, urine leakage, vision changes, and voice change.    Vital Signs:  Patient profile:   77 year old male Height:      67 inches Weight:      156 pounds BMI:     24.52 Pulse rate:   68 / minute Pulse rhythm:   regular BP sitting:   144 / 72  (left arm)  Vitals Entered By: Milford Cage NCMA (Aug 04, 2009 11:04 AM)  Physical Exam  General:  Well developed, well nourished, no acute distress.healthy appearing.   Head:  Normocephalic and atraumatic. Eyes:  PERRLA, no icterus. Neck:  Supple; no masses or thyromegaly. Lungs:  Clear throughout to auscultation. Heart:  Regular rate and rhythm; no murmurs, rubs,  or bruits. Abdomen:  Soft, nontender and nondistended. No masses, hepatosplenomegaly or hernias noted. Normal bowel sounds. Rectal:  Normal exam.Stool is normal color but is trace guaiac positive. Pulses:  Normal pulses noted. Extremities:  No clubbing, cyanosis, edema or deformities  noted. Neurologic:  Alert and  oriented x4;  grossly normal neurologically. Skin:  Intact without significant lesions or rashes. Cervical Nodes:  No significant cervical adenopathy. Psych:  Alert and cooperative. Normal mood and affect.   Impression & Recommendations:  Problem # 1:  MELENA (ICD-578.1) Assessment Improved Unusual presentation and constellation of symptomatology with negative GI workup to date. It is possible that this patient has small bowel malignancy or tumor such as a carcinoid lesion. I have set him up for small bowel camera exam, 24-hour urines for metanephrines and 5-HIAA, repeat labs, and we'll restart Protonix with avoidance of aspirin and salicylates and NSAIDs. Also I have empirically placed him on pancreatic extract therapy for his symptoms suggestive of malabsorption-maldigestion. There is no history of recurrent pancreatitis or hepatitis. He also does not have significant coronary artery disease or peripheral vascular disease to suggest ischemic bowel problems.He is followed by Dr. Lorenza Evangelist in cardiologyfor apparently a nonischemic cardiomyopathy. Orders: TLB-B12, Serum-Total ONLY (60454-U98) TLB-Ferritin (82728-FER) TLB-Folic Acid (Folate) (82746-FOL) TLB-IBC Pnl (Iron/FE;Transferrin) (83550-IBC) TLB-CRP-High Sensitivity (C-Reactive Protein) (86140-FCRP) TLB-Sedimentation Rate (ESR) (85652-ESR) T-Urine 24 Hr. 5 HIAA ([id number]) T-Urine 24 Hr. Metanephrines [phone])  Problem # 2:  HYPERTENSION (ICD-401.9) Assessment: Improved blood pressure today is 144/72 and pulse is 68 and regular. He is to resume his cardiac medications as listed.  Problem # 3:  CARDIOMYOPATHY (ICD-425.4) Assessment: Improved  Problem # 4:  DIVERTICULOSIS, COLON (ICD-562.10) Assessment: Unchanged Endoscopy and colonoscopy 6 months ago unremarkable except for chronic Barrett's mucosa associated with GERD. Examination for Helicobacter was negative.  Problem # 5:  GERD  (ICD-530.81) Assessment: Improved Resume daily PPI therapy.  Patient Instructions: 1)  Please go to the basement for lab work. 2)  Resume protonix. 3)  Being Zenpep before meals. 4)  Do not take any aspirin or other arthritis medications. 5)  You are scheduled for pill camera. 6)  The medication list was reviewed and reconciled.  All changed / newly prescribed medications were explained.  A complete medication list was provided to the patient / caregiver. 7)  Copy sent to : Dr. Dietrich Pates and Dr. Rene Paci 8)  Please continue current medications.   Appended Document: melena x3 days--ch.    Clinical Lists Changes  Medications: Added new medication of ZENPEP 20000 UNIT CPEP (PANCRELIPASE (LIP-PROT-AMYL)) 1 three times a day with meals - Signed Rx of ZENPEP 20000 UNIT CPEP (PANCRELIPASE (LIP-PROT-AMYL)) 1 three times a day with meals;  #90 x 6;  Signed;  Entered by: Ashok Cordia RN;  Authorized by: Mardella Layman MD [facility];  Method used: Electronically to [facility] [phone]*, [address] [place], [place]  [address], Ph: [phone], Fax: [phone] Orders: Added new Test order of Capsule Endoscopy (Capsule Endoscopy) - Signed    Prescriptions: ZENPEP 20000 UNIT CPEP (PANCRELIPASE (LIP-PROT-AMYL)) 1 three times a day with meals  #90 x 6   Entered by:   Ashok Cordia RN   Authorized by:   Mardella Layman MD [facility]   Signed by:   Ashok Cordia RN on 08/04/2009   Method used:   Electronically to        [facility] [address] [phone]* (retail)       [address] [place], [place]  [address]       Ph: [phone]       Fax: [phone]   RxID:   [id number]

## 2010-04-27 NOTE — Letter (Signed)
Summary: GI/[facility]  GI/[facility]   Imported By: Lester [place] 12/08/2009 11:36:06  _____________________________________________________________________  External Attachment:    Type:   Image     Comment:   External Document

## 2010-04-27 NOTE — Progress Notes (Signed)
Summary: Condition update   Phone Note Call from Patient Call back at Home Phone [phone]   Caller: Patient Call For: Dr. Jarold Motto Reason for Call: Talk to Nurse Summary of Call: Update: Pt. passed the capsule through his BM Initial call taken by: Karna Christmas,  Aug 19, 2009 9:11 AM  Follow-up for Phone Call        LM for pt to call.  Lupita Leash Surface RN  Aug 19, 2009 10:18 AM   Pt just reporting that he passed capsule.    No problems. Follow-up by: Ashok Cordia RN,  Aug 19, 2009 12:38 PM

## 2010-04-27 NOTE — Progress Notes (Signed)
Summary: wrong meds   Phone Note Call from Patient Call back at Home Phone [phone]   Caller: Patient Call For: Jarold Motto Reason for Call: Talk to Nurse Summary of Call: Patient wants to speak to nurse regarding wrong rx sent to the pharmacy, states that he was given colesterol meds. Initial call taken by: Tawni Levy,  September 23, 2009 2:52 PM  Follow-up for Phone Call        I explained to the patient that colestid was filled for diarrhea.  Patient  verbalized understanding and he will return to the pharmacy to pick it up. Follow-up by: Darcey Nora RN, CGRN,  September 23, 2009 3:01 PM

## 2010-04-27 NOTE — Assessment & Plan Note (Signed)
Summary: 3-6 MTH FU  STC   Vital Signs:  Patient profile:   77 year old male Height:      67 inches (170.18 cm) Weight:      150.0 pounds (68.18 kg) O2 Sat:      90 % on Room air Temp:     98.7 degrees F (37.06 degrees C) oral Pulse rate:   73 / minute BP sitting:   110 / 60  (left arm) Cuff size:   regular  Vitals Entered By: Orlan Leavens (October 06, 2009 8:15 AM)  O2 Flow:  Room air CC: 3 month follow-up Is Patient Diabetic? No Pain Assessment Patient in pain? no        Primary Care Provider:  Newt Lukes MD  CC:  3 month follow-up.  History of Present Illness: here for followup -  diarrhea - now dx with carcinoid syndrome - ongoing eval by GI with pending appt at [facility] 10/26/09 as unable to localize tumor to date - loose stool has improved  hypothyroid - reports compliance with ongoing medical treatment and no changes in medication dose or frequency. denies adverse side effects related to current therapy.   HTN - reports compliance with ongoing medical treatment and no changes in medication dose or frequency. denies adverse side effects related to current therapy. no CP, Ha or vision changes  hx CHF/CM - sees cards for same - no SOB or edema - no med changes - planning preop clearance for poss carcinoid syndrome  dyslipidemia - no longer taking fish oil, otherwise, reports compliance with ongoing medical treatment and no changes in medication dose or frequency. denies adverse side effects related to current therapy. no n/v or muscle pain/weakness    Clinical Review Panels:  CBC   WBC:  7.9 (08/01/2009)   RBC:  4.67 (08/01/2009)   Hgb:  14.4 (08/01/2009)   Hct:  42.0 (08/01/2009)   Platelets:  240.0 (08/01/2009)   MCV  89.9 (08/01/2009)   MCHC  34.3 (08/01/2009)   RDW  14.7 (08/01/2009)   PMN:  69.3 (08/01/2009)   Lymphs:  18.8 (08/01/2009)   Monos:  9.7 (08/01/2009)   Eosinophils:  1.6 (08/01/2009)   Basophil:  0.6 (08/01/2009)  Complete Metabolic  Panel   Glucose:  94 (09/19/2009)   Sodium:  141 (09/19/2009)   Potassium:  3.6 (09/19/2009)   Chloride:  103 (09/19/2009)   CO2:  30 (09/19/2009)   BUN:  26 (09/19/2009)   Creatinine:  1.4 (09/19/2009)   Albumin:  3.4 (09/23/2009)   Total Protein:  6.7 (09/23/2009)   Calcium:  8.7 (09/19/2009)   Total Bili:  0.9 (09/23/2009)   Alk Phos:  101 (09/23/2009)   SGPT (ALT):  20 (09/23/2009)   SGOT (AST):  17 (09/23/2009)   Current Medications (verified): 1)  Pantoprazole Sodium 40 Mg Tbec (Pantoprazole Sodium) .Marland Kitchen.. 1 By Mouth Once Daily 2)  Armour Thyroid 60 Mg Tabs (Thyroid) .Marland Kitchen.. 1 Capsule Once Daily 3)  Lisinopril-Hydrochlorothiazide 10-12.5 Mg Tabs (Lisinopril-Hydrochlorothiazide) .Marland Kitchen.. 1 Tab Once Daily 4)  Multivitamins  Tabs (Multiple Vitamin) .... Take 1 Tablet By Mouth Once A Day 5)  Zocor 20 Mg Tabs (Simvastatin) .... Take One Tabled By Mouth Once A Day 6)  Flomax 0.4 Mg Xr24h-Cap (Tamsulosin Hcl) .Marland Kitchen.. 1 Tab Once Daily 7)  Fish Oil   Oil (Fish Oil) .Marland Kitchen.. 1 Tab Once Daily 8)  Niaspan 500 Mg Cr-Tabs (Niacin (Antihyperlipidemic)) .Marland Kitchen.. 1 By Mouth Every Day 9)  B-12 (Unknown Dosage) .... Take  1 Tablet By Mouth Once A Day 10)  Vitamin D (Unknown Dosage) .... Take 1 Tablet By Mouth Once A Day 11)  Bilberry 280mg  .... 1 Tab Once Daily 12)  Pure Encapsulations Vitamin .Marland Kitchen.. 1 Daily 13)  Coqmax Dietary Supplement .Marland Kitchen.. 1 Tab Once Daily 14)  Digestzymes .... Take 1 Tablet By Mouth Once A Day 15)  Soulbee Fiber Formular Vitamin .Marland Kitchen.. 1 Daily 16)  Testosterone Cypionate 200 Mg/ml Oil (Testosterone Cypionate) .... Take 1  Injection Q  Week 17)  Zenpep 20000 Unit Cpep (Pancrelipase (Lip-Prot-Amyl)) .Marland Kitchen.. 1 Three Times A Day With Meals 18)  Metoprolol Succinate 25 Mg Xr24h-Tab (Metoprolol Succinate) .Marland Kitchen.. 1 Tab Every Day 19)  Periactin 4 Mg .Marland Kitchen.. 1 By Mouth Three Times A Day 20)  Potassium Chloride 20 Meq Pack (Potassium Chloride) .... Take 2 Tabs By Mouth Today Only.then  Take One Tablet By Mouth   Daily Thereafter. 21)  Colestid 1 Gm Tabs (Colestipol Hcl) .... One Tablet By Mouth Once Daily 22)  Imodium A-D 2 Mg Tabs (Loperamide Hcl) .... One Tablet By Mouth As Needed  Allergies (verified): 1)  ! Cipro  Past History:  Past Medical History: carcinoid syndrome (diarrhea), tumor location unclear - pending [facility] eval 10/26/09 Cardiomyopathy  Hyperlipidemia Hypertension IBS hypothyroid GERD with HH hx diverticulosis colon  MD roster: cards - ross GI - patterson uro - wrenn  endo - watkins  Review of Systems       The patient complains of depression.  The patient denies anorexia, fever, chest pain, syncope, dyspnea on exertion, and headaches.    Physical Exam  General:  thin, fit, alert, well-developed, well-nourished, and cooperative to examination.   nontoxic Lungs:  normal respiratory effort, no intercostal retractions or use of accessory muscles; normal breath sounds bilaterally - no crackles and no wheezes.    Heart:  normal rate, regular rhythm, no murmur, and no rub. BLE without edema. Abdomen:  firm but non-tender, normal bowel sounds, no distention; no masses and no appreciable hepatomegaly or splenomegaly.   Psych:  Oriented X3, memory intact for recent and remote, normally interactive but flat and subdued affect, good eye contact, not anxious appearing, mildly depressed appearing, but not agitated.      Impression & Recommendations:  Problem # 1:  CARCINOID SYNDROME (ICD-259.2)  He has carcinoid syndrome probably from a lung or liver but have not been able to localize to date.  continue cautious observation until eval at [facility] complete. concerned about his continued diarrhea and weight loss.  continue Colestid and p.r.n. Imodium use pending second opinion from Dr.Koch at [facility] gastroenterology.  I have reviewed chest x-ray, gastrin level, and liver function tests and other imaging and labs since last OV with me.  Problem # 2:  HYPERTENSION  (ICD-401.9)  His updated medication list for this problem includes:    Lisinopril-hydrochlorothiazide 10-12.5 Mg Tabs (Lisinopril-hydrochlorothiazide) .Marland Kitchen... 1 tab once daily    Metoprolol Succinate 25 Mg Xr24h-tab (Metoprolol succinate) .Marland Kitchen... 1 tab every day  Orders: TLB-BMP (Basic Metabolic Panel-BMET) (80048-METABOL)  BP today: 110/60 Prior BP: 128/64 (09/23/2009)  Labs Reviewed: K+: 3.6 (09/19/2009) Creat: : 1.4 (09/19/2009)   Chol: 145 (05/03/2009)   HDL: 31 (05/03/2009)   LDL: 99 (05/03/2009)   TG: 95 (05/03/2009)  Problem # 3:  HYPOTHYROIDISM (ICD-244.9)  His updated medication list for this problem includes:    Armour Thyroid 60 Mg Tabs (Thyroid) .Marland Kitchen... 1 capsule once daily  Orders: TLB-BMP (Basic Metabolic Panel-BMET) (80048-METABOL) TLB-TSH (Thyroid Stimulating  Hormone) (84443-TSH)  Labs Reviewed: TSH: 1.8656 (05/03/2009)    HgBA1c: 6.7 (05/03/2009) Chol: 145 (05/03/2009)   HDL: 31 (05/03/2009)   LDL: 99 (05/03/2009)   TG: 95 (05/03/2009)  Problem # 4:  HYPERLIPIDEMIA (ICD-272.4)  His updated medication list for this problem includes:    Zocor 20 Mg Tabs (Simvastatin) .Marland Kitchen... Take one tabled by mouth once a day    Niaspan 500 Mg Cr-tabs (Niacin (antihyperlipidemic)) .Marland Kitchen... 1 by mouth every day    Colestid 1 Gm Tabs (Colestipol hcl) ..... One tablet by mouth once daily  Labs Reviewed: SGOT: 17 (09/23/2009)   SGPT: 20 (09/23/2009)   HDL:31 (05/03/2009), 23.10 (12/27/2008)  LDL:99 (05/03/2009), 61 (12/27/2008)  Chol:145 (05/03/2009), 109 (12/27/2008)  Trig:95 (05/03/2009), 126.0 (12/27/2008)  Problem # 5:  CHF (ICD-428.0)  His updated medication list for this problem includes:    Lisinopril-hydrochlorothiazide 10-12.5 Mg Tabs (Lisinopril-hydrochlorothiazide) .Marland Kitchen... 1 tab once daily    Metoprolol Succinate 25 Mg Xr24h-tab (Metoprolol succinate) .Marland Kitchen... 1 tab every day  Probable nonischemic cardiomyopathy.  Doing well   for followup next week also for clearance  anticipating poss surg at [facility] -   Echocardiogram:   SUMMARY   -  Overall left ventricular systolic function was markedly         decreased. Left ventricular ejection fraction was estimated ,         range being 25 % to 30 % in the setting of frequent ectopy.         Although no diagnostic left ventricular regional wall motion         abnormality was identified, this possibility cannot be         completely excluded on the basis of this study. There was         moderate diffuse left ventricular hypokinesis. Left         ventricular wall thickness was mildly increased. Features         were consistent with mild diastolic dysfunction.   -  The aortic valve was mildly calcified.   -  There was mild aortic root dilatation.   -  There was mild mitral valvular regurgitation.   -  The left atrium was mildly dilated.   -  Right ventricular systolic function was reduced.    ---------------------------------------------------------------  (02/20/2008)  Complete Medication List: 1)  Pantoprazole Sodium 40 Mg Tbec (Pantoprazole sodium) .Marland Kitchen.. 1 by mouth once daily 2)  Armour Thyroid 60 Mg Tabs (Thyroid) .Marland Kitchen.. 1 capsule once daily 3)  Lisinopril-hydrochlorothiazide 10-12.5 Mg Tabs (Lisinopril-hydrochlorothiazide) .Marland Kitchen.. 1 tab once daily 4)  Multivitamins Tabs (Multiple vitamin) .... Take 1 tablet by mouth once a day 5)  Zocor 20 Mg Tabs (Simvastatin) .... Take one tabled by mouth once a day 6)  Flomax 0.4 Mg Xr24h-cap (Tamsulosin hcl) .Marland Kitchen.. 1 tab once daily 7)  Fish Oil Oil (Fish oil) .Marland Kitchen.. 1 tab once daily 8)  Niaspan 500 Mg Cr-tabs (Niacin (antihyperlipidemic)) .Marland Kitchen.. 1 by mouth every day 9)  Bilberry 280mg   .... 1 tab once daily 10)  Testosterone Cypionate 200 Mg/ml Oil (Testosterone cypionate) .... Take 1 injection every other week 11)  Zenpep 20000 Unit Cpep (Pancrelipase (lip-prot-amyl)) .Marland Kitchen.. 1 three times a day with meals 12)  Metoprolol Succinate 25 Mg Xr24h-tab (Metoprolol succinate) .Marland Kitchen.. 1  tab every day 13)  Potassium Chloride 20 Meq Pack (Potassium chloride) .... One tablet by mouth once daily 14)  Colestid 1 Gm Tabs (Colestipol hcl) .... One tablet by mouth once daily 15)  Imodium A-d 2 Mg Tabs (Loperamide hcl) .... One tablet by mouth as needed  Patient Instructions: 1)  it was good to see you today.  2)  test(s) ordered today - your results will be posted on the phone tree for review in 48-72 hours from the time of test completion; call [phone] and enter your 9 digit MRN (listed above on this page, just below your name); if any changes need to be made or there are abnormal results, you will be contacted directly.  3)  good luck with your evaluation at [facility] and any possible surgery - let us know if any information is needed from [place] for this! 4)  medications reviewed - no changes in your prescriptions - continue to use the immodium as needed  5)  Please schedule a follow-up appointment in 3 months, sooner if problems.

## 2010-04-27 NOTE — Consult Note (Signed)
Summary: Gastroenterology/[facility]  Gastroenterology[facility]   Imported By: Sherian Rein 11/11/2009 10:48:30  _____________________________________________________________________  External Attachment:    Type:   Image     Comment:   External Document

## 2010-04-27 NOTE — Assessment & Plan Note (Signed)
Summary: dark stool/patient has sample in hand/lb   Vital Signs:  Patient profile:   77 year old male Height:      67 inches (170.18 cm) Weight:      152.4 pounds (69.27 kg) BMI:     23.96 O2 Sat:      94 % on Room air Temp:     97.5 degrees F (36.39 degrees C) oral Pulse rate:   62 / minute BP sitting:   128 / 70  (left arm) Cuff size:   regular  Vitals Entered By: Orlan Leavens (Aug 01, 2009 10:45 AM)  O2 Flow:  Room air CC: Blood in stool, Diarrhea Is Patient Diabetic? No Pain Assessment Patient in pain? no        Primary Care Provider:  Newt Lukes MD  CC:  Blood in stool and Diarrhea.  History of Present Illness:  Diarrhea      This is an 77 year old man who presents with Diarrhea.  The symptoms began 3 ago.  The severity is described as moderate.  black thin stools preceeded by extreme gas and bloating 0.  The patient reports 3 stools or less per day, blood in stool, malodorous stools, fecal soiling, and bloating.  The patient denies fever, abdominal pain, nausea, vomiting, lightheadedness, and weight loss.  The symptoms are worse with any food and specific medication use.  The symptoms are better with fasting and medication discontinuation.  Patient's risk factors for diarrhea include laxative use, recent antibiotic use, and international travel.  Patient has a  history of diverticulitis.    Clinical Review Panels:  Prevention   Last Colonoscopy:  DONE (02/21/2009)   Last PSA:  1.00 (05/03/2009)  CBC   WBC:  7.9 (04/05/2008)   RBC:  4.90 (04/05/2008)   Hgb:  16.0 (04/05/2008)   Hct:  46.4 (04/05/2008)   Platelets:  153 (04/05/2008)   MCV  94.7 (04/05/2008)   MCHC  34.4 (04/05/2008)   RDW  12.9 (04/05/2008)   PMN:  60.4 (04/05/2008)   Lymphs:  22.2 (04/05/2008)   Monos:  10.9 (04/05/2008)   Eosinophils:  5.5 (04/05/2008)   Basophil:  1.0 (04/05/2008)   Current Medications (verified): 1)  Pantoprazole Sodium 40 Mg Tbec (Pantoprazole Sodium) .Marland Kitchen.. 1  By Mouth Once Daily 2)  Armour Thyroid 60 Mg Tabs (Thyroid) .Marland Kitchen.. 1 Capsule Once Daily 3)  Lisinopril-Hydrochlorothiazide 10-12.5 Mg Tabs (Lisinopril-Hydrochlorothiazide) .Marland Kitchen.. 1 Tab Once Daily 4)  Multivitamins  Tabs (Multiple Vitamin) .... Take 1 Tablet By Mouth Once A Day 5)  Zocor 20 Mg Tabs (Simvastatin) .... Take One Tabled By Mouth Once A Day 6)  Flomax 0.4 Mg Xr24h-Cap (Tamsulosin Hcl) .Marland Kitchen.. 1 Tab Once Daily 7)  Fish Oil   Oil (Fish Oil) .Marland Kitchen.. 1 Tab Once Daily 8)  Niaspan 500 Mg Cr-Tabs (Niacin (Antihyperlipidemic)) .Marland Kitchen.. 1 By Mouth Every Day 9)  B-12 (Unknown Dosage) .... Take 1 Tablet By Mouth Once A Day 10)  Vitamin D (Unknown Dosage) .... Take 1 Tablet By Mouth Once A Day 11)  Bilberry 280mg  .... 1 Tab Once Daily 12)  Pure Encapsulations Vitamin .Marland Kitchen.. 1 Daily 13)  Coqmax Dietary Supplement .Marland Kitchen.. 1 Tab Once Daily 14)  Digestzymes .... Take 1 Tablet By Mouth Once A Day 15)  Soulbee Fiber Formular Vitamin .Marland Kitchen.. 1 Daily 16)  Testosterone Cypionate 200 Mg/ml Oil (Testosterone Cypionate) .... Take 1  Injection Q  Week 17)  Imodium A-D 2 Mg Tabs (Loperamide Hcl) .... Take 1-2 By  Mouth Once Daily As Needed 18)  Lomotil 2.5-0.025 Mg Tabs (Diphenoxylate-Atropine) .Marland Kitchen.. 1 By Mouth Every 4 Hours As Needed For Diarrhea - Max 8/24h  Allergies (verified): 1)  ! Cipro  Past History:  Past Medical History: Reviewed history from 07/19/2009 and no changes required. Cardiomyopathy  Hyperlipidemia Hypertension IBS hypothyroid GERD with HH hx diverticulosis colon  MD rooster: cards - [facility] - [facility] - wrenn  endo - [facility]  Review of Systems       The patient complains of melena and hematochezia.  The patient denies anorexia, weight loss, chest pain, headaches, and severe indigestion/heartburn.    Physical Exam  General:  thin, fit, alert, well-developed, well-nourished, and cooperative to examination.   nontoxic Lungs:  normal respiratory effort, no intercostal retractions or use  of accessory muscles; normal breath sounds bilaterally - no crackles and no wheezes.    Heart:  normal rate, regular rhythm, no murmur, and no rub. BLE without edema. Abdomen:  firm but non-tender, normal bowel sounds, no distention; no masses and no appreciable hepatomegaly or splenomegaly.   Rectal:  no external abnormalities, no hemorrhoids, normal sphincter tone, no masses, and stool positive for occult blood.  (melena)   Impression & Recommendations:  Problem # 1:  MELENA (ICD-578.1)  onset 3 days ago - preceeded by long hx of diarrhea for which we tx with lomotil last OV - no fever, nontox and HD stable - not on NSAIDs or blood thinners labs now and refer back to GI -  Orders: TLB-CBC Platelet - w/Differential (85025-CBCD) TLB-BMP (Basic Metabolic Panel-BMET) (80048-METABOL) Hemoccult Guaiac-1 spec.(in office) (78295) Gastroenterology Referral (GI)  Complete Medication List: 1)  Pantoprazole Sodium 40 Mg Tbec (Pantoprazole sodium) .Marland Kitchen.. 1 by mouth once daily 2)  Armour Thyroid 60 Mg Tabs (Thyroid) .Marland Kitchen.. 1 capsule once daily 3)  Lisinopril-hydrochlorothiazide 10-12.5 Mg Tabs (Lisinopril-hydrochlorothiazide) .Marland Kitchen.. 1 tab once daily 4)  Multivitamins Tabs (Multiple vitamin) .... Take 1 tablet by mouth once a day 5)  Zocor 20 Mg Tabs (Simvastatin) .... Take one tabled by mouth once a day 6)  Flomax 0.4 Mg Xr24h-cap (Tamsulosin hcl) .Marland Kitchen.. 1 tab once daily 7)  Fish Oil Oil (Fish oil) .Marland Kitchen.. 1 tab once daily 8)  Niaspan 500 Mg Cr-tabs (Niacin (antihyperlipidemic)) .Marland Kitchen.. 1 by mouth every day 9)  B-12 (unknown Dosage)  .... Take 1 tablet by mouth once a day 10)  Vitamin D (unknown Dosage)  .... Take 1 tablet by mouth once a day 11)  Bilberry 280mg   .... 1 tab once daily 12)  Pure Encapsulations Vitamin  .Marland Kitchen.. 1 daily 13)  Coqmax Dietary Supplement  .Marland Kitchen.. 1 tab once daily 14)  Digestzymes  .... Take 1 tablet by mouth once a day 15)  Soulbee Fiber Formular Vitamin  .Marland Kitchen.. 1 daily 16)   Testosterone Cypionate 200 Mg/ml Oil (Testosterone cypionate) .... Take 1  injection q  week 17)  Imodium A-d 2 Mg Tabs (Loperamide hcl) .... Take 1-2 by mouth once daily as needed 18)  Lomotil 2.5-0.025 Mg Tabs (Diphenoxylate-atropine) .Marland Kitchen.. 1 by mouth every 4 hours as needed for diarrhea - max 8/24h  Patient Instructions: 1)  it was good to see you today. 2)  test(s) ordered today - your results will be posted on the phone tree for review in 48-72 hours from the time of test completion; call [phone] and enter your 9 digit MRN (listed above on this page, just below your name); if any changes need to be made or there  are abnormal results, you will be contacted directly.  3)  we'll make referral to GI ([facility]). Our office will contact you regarding this appointment once made.  4)  if your symptoms continue to worsen (bleeding, abdminal pain, fever, etc), or if you are unable take anything by mouth (pills, fluids, etc), you should go to the emergency room for further evaluation and treatment.

## 2010-04-27 NOTE — Letter (Signed)
Summary: [facility]   Imported By: Sherian Rein 01/10/2010 12:10:51  _____________________________________________________________________  External Attachment:    Type:   Image     Comment:   External Document

## 2010-04-27 NOTE — Progress Notes (Signed)
Summary: med question   Phone Note Call from Patient Call back at Home Phone [phone]   Caller: Patient Reason for Call: Talk to Nurse Summary of Call: request to speak to nurse about meds Initial call taken by: Migdalia Dk,  August 26, 2009 11:20 AM  Follow-up for Phone Call        Called patient back ..he wanted to know if taking Metoprolol would cause his stool to be black in color. Advised him probably not and he should call GI and report this finding to them. He verbalized understanding. Follow-up by: Suzan Garibaldi RN

## 2010-04-27 NOTE — Letter (Signed)
[facility]: [facility]   Imported By: Sherian Rein 12/06/2009 09:46:40  _____________________________________________________________________  External Attachment:    Type:   Image     Comment:   External Document

## 2010-04-27 NOTE — Progress Notes (Signed)
Summary: Triage   Phone Note Call from Patient Call back at Home Phone [phone]   Caller: Patient Call For: Dr. Jarold Motto Reason for Call: Talk to Nurse, Lab or Test Results Summary of Call: pt. is asking to speak directly to nurse Initial call taken by: Karna Christmas,  September 15, 2009 2:14 PM  Follow-up for Phone Call        Pt asks if his problems could be coming form C-diff.  Pt has follow up appt next week.  He will discuss this with Dr. Jarold Motto at [facility]. FYI Follow-up by: Ashok Cordia RN,  September 15, 2009 2:43 PM  Additional Follow-up for Phone Call Additional follow up Details #1::       Additional Follow-up by: Mardella Layman MD Clementeen Graham,  September 16, 2009 8:27 AM

## 2010-04-27 NOTE — Assessment & Plan Note (Signed)
Summary: IRRIT BOWEL  SYNDROME---STC   Vital Signs:  Patient profile:   77 year old male Height:      67 inches (170.18 cm) Weight:      152.4 pounds (69.27 kg) O2 Sat:      93 % on Room air Temp:     97.7 degrees F (36.50 degrees C) rectal Pulse rate:   76 / minute BP sitting:   120 / 62  (left arm) Cuff size:   regular  Vitals Entered By: Orlan Leavens (July 19, 2009 1:23 PM)  O2 Flow:  Room air CC: Irritable bowel flare-up, Diarrhea Is Patient Diabetic? No Pain Assessment Patient in pain? no        Primary Care Provider:  Newt Lukes MD  CC:  Irritable bowel flare-up and Diarrhea.  History of Present Illness:  Diarrhea      This is an 77 year old man who presents with Diarrhea.  The symptoms began 3 months ago.  The severity is described as severe.  The patient reports 4-6 stools per day, watery/unformed stools, bloating, and gassiness, but denies >6 stools per day, blood in stool, mucus in stool, greasy stools, malodorous stools, fecal soiling, alternating diarrhea/constipation, nocturnal diarrhea, fasting diarrhea, and abrupt onset of symptoms.  The patient denies fever, abdominal pain, abdominal cramps, nausea, vomiting, weight loss, and mouth ulcers.  The symptoms are worse with any food.  The symptoms are better with fasting and hypomotility agents.  Patient's risk factors for diarrhea include laxative use.  Patient has a  history of irritable bowel syndrome.    Clinical Review Panels:  Prevention   Last Colonoscopy:  DONE (02/21/2009)   Last PSA:  1.00 (05/03/2009)  Lipid Management   Cholesterol:  145 (05/03/2009)   LDL (bad choesterol):  99 (05/03/2009)   HDL (good cholesterol):  31 (05/03/2009)   Triglycerides:  95 (05/03/2009)  CBC   WBC:  7.9 (04/05/2008)   RBC:  4.90 (04/05/2008)   Hgb:  16.0 (04/05/2008)   Hct:  46.4 (04/05/2008)   Platelets:  153 (04/05/2008)   MCV  94.7 (04/05/2008)   MCHC  34.4 (04/05/2008)   RDW  12.9 (04/05/2008)  PMN:  60.4 (04/05/2008)   Lymphs:  22.2 (04/05/2008)   Monos:  10.9 (04/05/2008)   Eosinophils:  5.5 (04/05/2008)   Basophil:  1.0 (04/05/2008)  Complete Metabolic Panel   Glucose:  97 (05/03/2009)   Sodium:  139 (05/03/2009)   Potassium:  4.6 (05/03/2009)   Chloride:  101 (05/03/2009)   CO2:  29 (05/03/2009)   BUN:  18 (05/03/2009)   Creatinine:  1.24 (05/03/2009)   Albumin:  3.9 (05/03/2009)   Total Protein:  6.5 (05/03/2009)   Calcium:  9.5 (05/03/2009)   Total Bili:  0.6 (05/03/2009)   Alk Phos:  117 (05/03/2009)   SGPT (ALT):  15 (05/03/2009)   SGOT (AST):  15 (05/03/2009)   -  Date:  05/03/2009    BG Random: 97    BUN: 18    Creatinine: 1.24    Sodium: 139    Potassium: 4.6    Chloride: 101    CO2 Total: 29    SGOT (AST): 15    SGPT (ALT): 15    T. Bilirubin: 0.6    Alk Phos: 117    Calcium: 9.5    Total Protein: 6.5    Albumin: 3.9    Cholesterol: 145    LDL: 99    HDL: 31    Triglycerides: 95  HgbA1c: 6.7    TSH: 1.8656    PSA 1.00  Current Medications (verified): 1)  Pantoprazole Sodium 40 Mg Tbec (Pantoprazole Sodium) .Marland Kitchen.. 1 By Mouth Once Daily 2)  Armour Thyroid 60 Mg Tabs (Thyroid) .Marland Kitchen.. 1 Capsule Once Daily 3)  Lisinopril-Hydrochlorothiazide 10-12.5 Mg Tabs (Lisinopril-Hydrochlorothiazide) .Marland Kitchen.. 1 Tab Once Daily 4)  Multivitamins  Tabs (Multiple Vitamin) .... Take 1 Tablet By Mouth Once A Day 5)  Zocor 20 Mg Tabs (Simvastatin) .... Take One Tabled By Mouth Once A Day 6)  Flomax 0.4 Mg Xr24h-Cap (Tamsulosin Hcl) .Marland Kitchen.. 1 Tab Once Daily 7)  Fish Oil   Oil (Fish Oil) .Marland Kitchen.. 1 Tab Once Daily 8)  Niaspan 500 Mg Cr-Tabs (Niacin (Antihyperlipidemic)) .Marland Kitchen.. 1 By Mouth Every Day 9)  B-12 (Unknown Dosage) .... Take 1 Tablet By Mouth Once A Day 10)  Vitamin D (Unknown Dosage) .... Take 1 Tablet By Mouth Once A Day 11)  Bilberry 280mg  .... 1 Tab Once Daily 12)  Pure Encapsulations Vitamin .Marland Kitchen.. 1 Daily 13)  Coqmax Dietary Supplement .Marland Kitchen.. 1 Tab Once Daily 14)   Digestzymes .... Take 1 Tablet By Mouth Once A Day 15)  Soulbee Fiber Formular Vitamin .Marland Kitchen.. 1 Daily 16)  Testosterone Cypionate 200 Mg/ml Oil (Testosterone Cypionate) .... Take 1  Injection Q  Week 17)  Imodium A-D 2 Mg Tabs (Loperamide Hcl) .... Take 1-2 By Mouth Once Daily As Needed  Allergies (verified): 1)  ! Cipro  Past History:  Past Medical History: Cardiomyopathy  Hyperlipidemia Hypertension IBS hypothyroid GERD with HH hx diverticulosis colon  MD rooster: cards - [facility] - [facility] - wrenn  endo - [facility]  Review of Systems  The patient denies weight loss, chest pain, syncope, headaches, melena, hematochezia, and severe indigestion/heartburn.    Physical Exam  General:  thin, fit, alert, well-developed, well-nourished, and cooperative to examination.    Lungs:  normal respiratory effort, no intercostal retractions or use of accessory muscles; normal breath sounds bilaterally - no crackles and no wheezes.    Heart:  normal rate, regular rhythm, no murmur, and no rub. BLE without edema. Abdomen:  soft, non-tender, normal bowel sounds, no distention; no masses and no appreciable hepatomegaly or splenomegaly.     Impression & Recommendations:  Problem # 1:  DIARRHEA (ICD-787.91) add lomtil and stop fiber supplements - f/u GI if not resolving  His updated medication list for this problem includes:    Imodium A-d 2 Mg Tabs (Loperamide hcl) .Marland Kitchen... Take 1-2 by mouth once daily as needed    Lomotil 2.5-0.025 Mg Tabs (Diphenoxylate-atropine) .Marland Kitchen... 1 by mouth every 4 hours as needed for diarrhea - max 8/24h  prior GI notes from 02/2009 reviewed: I suspect he has bacterial overgrowth syndrome and low-grade malabsorption. I will treat him with metronidazole 250 mg t.i.d. for 10 days and continue probiotic therapy. Screening labs are been ordered. She not respond to this treatment, we'll consider empiric trial of pancreatic extract therapy. I've asked him to hold his  Niaspan. Celiac profile has been ordered despite a negative biopsy of the small bowel Orders: TLB-B12, Serum-Total ONLY (16109-U04) TLB-Ferritin (82728-FER) TLB-Folic Acid (Folate) (82746-FOL) TLB-IBC Pnl (Iron/FE;Transferrin) (83550-IBC) T-Beta Carotene [phone]) T-Sprue Panel (Celiac Disease Aby Eval) (83516x3/[address]) T-Tissue Transglutamase Ab IgA (78295-62130) TLB-IgA (Immunoglobulin A) (82784-IGA)  Complete Medication List: 1)  Pantoprazole Sodium 40 Mg Tbec (Pantoprazole sodium) .Marland Kitchen.. 1 by mouth once daily 2)  Armour Thyroid 60 Mg Tabs (Thyroid) .Marland Kitchen.. 1 capsule once daily 3)  Lisinopril-hydrochlorothiazide 10-12.5 Mg  Tabs (Lisinopril-hydrochlorothiazide) .Marland Kitchen.. 1 tab once daily 4)  Multivitamins Tabs (Multiple vitamin) .... Take 1 tablet by mouth once a day 5)  Zocor 20 Mg Tabs (Simvastatin) .... Take one tabled by mouth once a day 6)  Flomax 0.4 Mg Xr24h-cap (Tamsulosin hcl) .Marland Kitchen.. 1 tab once daily 7)  Fish Oil Oil (Fish oil) .Marland Kitchen.. 1 tab once daily 8)  Niaspan 500 Mg Cr-tabs (Niacin (antihyperlipidemic)) .Marland Kitchen.. 1 by mouth every day 9)  B-12 (unknown Dosage)  .... Take 1 tablet by mouth once a day 10)  Vitamin D (unknown Dosage)  .... Take 1 tablet by mouth once a day 11)  Bilberry 280mg   .... 1 tab once daily 12)  Pure Encapsulations Vitamin  .Marland Kitchen.. 1 daily 13)  Coqmax Dietary Supplement  .Marland Kitchen.. 1 tab once daily 14)  Digestzymes  .... Take 1 tablet by mouth once a day 15)  Soulbee Fiber Formular Vitamin  .Marland Kitchen.. 1 daily 16)  Testosterone Cypionate 200 Mg/ml Oil (Testosterone cypionate) .... Take 1  injection q  week 17)  Imodium A-d 2 Mg Tabs (Loperamide hcl) .... Take 1-2 by mouth once daily as needed 18)  Lomotil 2.5-0.025 Mg Tabs (Diphenoxylate-atropine) .Marland Kitchen.. 1 by mouth every 4 hours as needed for diarrhea - max 8/24h  Patient Instructions: 1)  it was good to see you today. 2)  stop the fiber and Align - 3)  continue the Immodium and diet shake supplements 4)  Add Lomitil as  needed - prescription provided 5)  followup with Dr. Jarold Motto if continued diarrhea and gas - Prescriptions: LOMOTIL 2.5-0.025 MG TABS (DIPHENOXYLATE-ATROPINE) 1 by mouth every 4 hours as needed for diarrhea - max 8/24h  #40 x 1   Entered and Authorized by:   Newt Lukes MD   Signed by:   Newt Lukes MD on 07/19/2009   Method used:   Print then Give to Patient   RxID:   [id number]

## 2010-04-27 NOTE — Assessment & Plan Note (Signed)
Summary: NEW PT / [employer] MEDICARE / [place]   Vital Signs:  Patient profile:   77 year old male Height:      67 inches (170.18 cm) Weight:      154.6 pounds (70.27 kg) O2 Sat:      93 % on Room air Temp:     98.1 degrees F (36.72 degrees C) oral Pulse rate:   60 / minute BP sitting:   138 / 62  (left arm) Cuff size:   regular  Vitals Entered By: Orlan Leavens (June 27, 2009 9:20 AM)  O2 Flow:  Room air CC: New patient Is Patient Diabetic? No Pain Assessment Patient in pain? no        Primary Care Provider:  Newt Lukes MD  CC:  New patient.  History of Present Illness: new pt to me and our division - her to est care prev followed with dr. Perrin Maltese at [facility] care - also multiple specialists reviewed  1) hypothyroid - reports compliance with ongoing medical treatment and no changes in medication dose or frequency. denies adverse side effects related to current therapy.   2) concern about RUE tremor - ?if parkinsons -  opnset >2 years ago, gradually progressive course - symptoms not present everyday but most days symptoms worst when writing, holiding object in right hand - no weakness -  no head tremor or leg symptoms - no palpitations, no trouble walking or talking/swallowing  3) notes discoloration of inner thighs - no rash, no itch, no burn no new sexual contacts or skin exposures- no other skin areas affected by discoloration - not improved with use of OTC "lotion" -  4) HTN - reports compliance with ongoing medical treatment and no changes in medication dose or frequency. denies adverse side effects related to current therapy. no CP, Ha or vision changes  5) hx CHF/CM - sees cards for same - no SOB or edema - no med changes   Preventive Screening-Counseling & Management  Alcohol-Tobacco     Alcohol drinks/day: 0     Alcohol Counseling: not indicated; patient does not drink     Smoking Status: quit > 6 months     Year Quit: 1961     Tobacco Counseling: to remain  off tobacco products  Caffeine-Diet-Exercise     Does Patient Exercise: yes     Type of exercise: walking     Depression Counseling: not indicated; screening negative for depression  Safety-Violence-Falls     Seat Belt Use: yes     Firearms in the Home: no firearms in the home     Violence in the Home: no risk noted     Fall Risk Counseling: not indicated; no significant falls noted  Clinical Review Panels:  Prevention   Last Colonoscopy:  DONE (02/21/2009)  Immunizations   Last Tetanus Booster:  Historical (03/27/2007)   Last Flu Vaccine:  Historical (02/23/2009)   Last Pneumovax:  Historical (03/27/2007)  Lipid Management   Cholesterol:  109 (12/27/2008)   LDL (bad choesterol):  61 (12/27/2008)   HDL (good cholesterol):  23.10 (12/27/2008)  CBC   WBC:  7.9 (04/05/2008)   RBC:  4.90 (04/05/2008)   Hgb:  16.0 (04/05/2008)   Hct:  46.4 (04/05/2008)   Platelets:  153 (04/05/2008)   MCV  94.7 (04/05/2008)   MCHC  34.4 (04/05/2008)   RDW  12.9 (04/05/2008)   PMN:  60.4 (04/05/2008)   Lymphs:  22.2 (04/05/2008)   Monos:  10.9 (04/05/2008)  Eosinophils:  5.5 (04/05/2008)   Basophil:  1.0 (04/05/2008)  Complete Metabolic Panel   Glucose:  121 (08/12/2008)   Sodium:  139 (08/12/2008)   Potassium:  4.8 (08/12/2008)   Chloride:  103 (08/12/2008)   CO2:  29 (08/12/2008)   BUN:  25 (08/12/2008)   Creatinine:  1.3 (08/12/2008)   Albumin:  3.4 (12/27/2008)   Total Protein:  6.3 (12/27/2008)   Calcium:  9.5 (08/12/2008)   Total Bili:  1.0 (12/27/2008)   Alk Phos:  108 (12/27/2008)   SGPT (ALT):  16 (12/27/2008)   SGOT (AST):  18 (12/27/2008)   Current Medications (verified): 1)  Nexium 40 Mg  Cpdr (Esomeprazole Magnesium) .Marland Kitchen.. 1 Capsule Each Day 30 Minutes Before Meal 2)  Armour Thyroid 60 Mg Tabs (Thyroid) .Marland Kitchen.. 1 Capsule Once Daily 3)  Lisinopril-Hydrochlorothiazide 10-12.5 Mg Tabs (Lisinopril-Hydrochlorothiazide) .Marland Kitchen.. 1 Tab Once Daily 4)  Multivitamins  Tabs  (Multiple Vitamin) .... Take 1 Tablet By Mouth Once A Day 5)  Zocor 20 Mg Tabs (Simvastatin) .... Take One Tabled By Mouth Once A Day 6)  Flomax 0.4 Mg Xr24h-Cap (Tamsulosin Hcl) .Marland Kitchen.. 1 Tab Once Daily 7)  Fish Oil   Oil (Fish Oil) .Marland Kitchen.. 1 Tab Once Daily 8)  Niaspan 500 Mg Cr-Tabs (Niacin (Antihyperlipidemic)) .Marland Kitchen.. 1 By Mouth Every Day 9)  B-12 (Unknown Dosage) .... Take 1 Tablet By Mouth Once A Day 10)  Vitamin D (Unknown Dosage) .... Take 1 Tablet By Mouth Once A Day 11)  Bilberry 280mg  .... 1 Tab Once Daily 12)  Pure Encapsulations Vitamin .Marland Kitchen.. 1 Daily 13)  Coqmax Dietary Supplement .Marland Kitchen.. 1 Tab Once Daily 14)  Digestzymes .... Take 1 Tablet By Mouth Once A Day 15)  Soulbee Fiber Formular Vitamin .Marland Kitchen.. 1 Daily 16)  Testosterone Cypionate 200 Mg/ml Oil (Testosterone Cypionate) .... Take 1  Injection Q  Week  Allergies (verified): 1)  ! Cipro  Past History:  Past medical, surgical, family and social histories (including risk factors) reviewed, and no changes noted (except as noted below).  Past Medical History: Cardiomyopathy  Hyperlipidemia Hypertension IBS hypothyroid GERD with HH hx diverticulosis colon   MD rooster: cards - ross GI - patterson uro - wrenn  endo - watkins  Past Surgical History: Cholecystectomy lung surgery 1984 Umbilical Hernia Repair Tonsillectomy  Family History: Reviewed history from 02/01/2009 and no changes required. Family History of Prostate Cancer:brother Family History of Heart Disease: mother Family History of Colon Cancer:father Family History of Colon Polyps:Father - died age 72  Social History: Reviewed history from 02/01/2009 and no changes required. 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 -stopped in 1964 Alcohol Use - yes-occasional Illicit Drug Use - no Patient gets regular exercise.-walk 3 miles daily Smoking Status:  quit > 6 months Seat Belt Use:  yes  Review of  Systems       see HPI above. I have reviewed all other systems and they were negative.   Physical Exam  General:  thin, fit, alert, well-developed, well-nourished, and cooperative to examination.    Eyes:  vision grossly intact; pupils equal, round and reactive to light.  conjunctiva and lids normal.    Ears:  normal pinnae bilaterally, without erythema, swelling, or tenderness to palpation. TMs clear, without effusion, or cerumen impaction. Hearing grossly normal bilaterally  Mouth:  teeth and gums in good repair; mucous membranes moist, without lesions or ulcers. oropharynx clear without exudate, no erythema.  Neck:  supple, full ROM,  no masses, no thyromegaly; no thyroid nodules or tenderness. no JVD or carotid bruits.   Lungs:  normal respiratory effort, no intercostal retractions or use of accessory muscles; normal breath sounds bilaterally - no crackles and no wheezes.    Heart:  normal rate, regular rhythm, no murmur, and no rub. BLE without edema. normal DP pulses and normal cap refill in all 4 extremities    Abdomen:  soft, non-tender, normal bowel sounds, no distention; no masses and no appreciable hepatomegaly or splenomegaly.   Genitalia:  Testes bilaterally descended without nodularity, tenderness or masses. No scrotal masses or lesions. No penis lesions or urethral discharge. Prostate:  defer to uro Msk:  No deformity or scoliosis noted of thoracic or lumbar spine.   Neurologic:  alert & oriented X3 and cranial nerves II-XII symetrically intact.  strength normal in all extremities, sensation intact to light touch, and gait normal. speech fluent without dysarthria or aphasia; follows commands with good comprehension. benign intention tremor of right hand when extended Skin:  tinea changes bilateral groin R>L, not imflammed - otherwise, no rashes, vesicles, ulcers, or erythema. No nodules or irregularity to palpation.  Psych:  Oriented X3, memory intact for recent and remote, normally  interactive, good eye contact, not anxious appearing, not depressed appearing, and not agitated.      Impression & Recommendations:  Problem # 1:  TINEA CORPORIS (ICD-110.5) dx and information provided -  offered rx cream/lotion and pt declines tx- "its not bothering me" -  Problem # 2:  HYPERTENSION (ICD-401.9)  His updated medication list for this problem includes:    Lisinopril-hydrochlorothiazide 10-12.5 Mg Tabs (Lisinopril-hydrochlorothiazide) .Marland Kitchen... 1 tab once daily  BP today: 138/62 Prior BP: 108/62 (03/01/2009)  Labs Reviewed: K+: 4.8 (08/12/2008) Creat: : 1.3 (08/12/2008)   Chol: 109 (12/27/2008)   HDL: 23.10 (12/27/2008)   LDL: 61 (12/27/2008)   TG: 126.0 (12/27/2008)  Problem # 3:  HYPERLIPIDEMIA (ICD-272.4)  His updated medication list for this problem includes:    Zocor 20 Mg Tabs (Simvastatin) .Marland Kitchen... Take one tabled by mouth once a day    Niaspan 500 Mg Cr-tabs (Niacin (antihyperlipidemic)) .Marland Kitchen... 1 by mouth every day  Labs Reviewed: SGOT: 18 (12/27/2008)   SGPT: 16 (12/27/2008)   HDL:23.10 (12/27/2008), 25.30 (10/08/2008)  LDL:61 (12/27/2008), 80 (10/08/2008)  Chol:109 (12/27/2008), 122 (10/08/2008)  Trig:126.0 (12/27/2008), 83.0 (10/08/2008)  Problem # 4:  CHF (ICD-428.0)  His updated medication list for this problem includes:    Lisinopril-hydrochlorothiazide 10-12.5 Mg Tabs (Lisinopril-hydrochlorothiazide) .Marland Kitchen... 1 tab once daily  continue cardiac medications as per Dr. Huston Foley  Problem # 5:  CARDIOMYOPATHY (ICD-425.4) Assessment: Unchanged  Followed by Dr. Tenny Craw  Problem # 6:  GERD (ICD-530.81)  His updated medication list for this problem includes:    Nexium 40 Mg Cpdr (Esomeprazole magnesium) .Marland Kitchen... 1 capsule each day 30 minutes before meal  EGD: DONE (02/21/2009)  Labs Reviewed: Hgb: 16.0 (04/05/2008)   Hct: 46.4 (04/05/2008)  Problem # 7:  TREMOR, ESSENTIAL (ICD-333.1) reassurance and dz provided -   Time spent with patient (45) minutes,  more than 50% of this time was spent counseling patient on his skin rash, tremor and review of medications including supplements - will send for ROI and records from prior PCP/endo to review re: thryoid status but no labs ordered by me today  Complete Medication List: 1)  Nexium 40 Mg Cpdr (Esomeprazole magnesium) .Marland Kitchen.. 1 capsule each day 30 minutes before meal 2)  Armour Thyroid 60 Mg Tabs (Thyroid) .Marland KitchenMarland KitchenMarland Kitchen  1 capsule once daily 3)  Lisinopril-hydrochlorothiazide 10-12.5 Mg Tabs (Lisinopril-hydrochlorothiazide) .Marland Kitchen.. 1 tab once daily 4)  Multivitamins Tabs (Multiple vitamin) .... Take 1 tablet by mouth once a day 5)  Zocor 20 Mg Tabs (Simvastatin) .... Take one tabled by mouth once a day 6)  Flomax 0.4 Mg Xr24h-cap (Tamsulosin hcl) .Marland Kitchen.. 1 tab once daily 7)  Fish Oil Oil (Fish oil) .Marland Kitchen.. 1 tab once daily 8)  Niaspan 500 Mg Cr-tabs (Niacin (antihyperlipidemic)) .Marland Kitchen.. 1 by mouth every day 9)  B-12 (unknown Dosage)  .... Take 1 tablet by mouth once a day 10)  Vitamin D (unknown Dosage)  .... Take 1 tablet by mouth once a day 11)  Bilberry 280mg   .... 1 tab once daily 12)  Pure Encapsulations Vitamin  .Marland Kitchen.. 1 daily 13)  Coqmax Dietary Supplement  .Marland Kitchen.. 1 tab once daily 14)  Digestzymes  .... Take 1 tablet by mouth once a day 15)  Soulbee Fiber Formular Vitamin  .Marland Kitchen.. 1 daily 16)  Testosterone Cypionate 200 Mg/ml Oil (Testosterone cypionate) .... Take 1  injection q  week  Patient Instructions: 1)  it was good to see you today.  2)  will send for blood work results from dr. watkin's office to review and incorporate into our records - 3)  medications reviewed - no recomendations for changes- 4)  If the groin rash becomes bothersome, please let us know and we can treat this with a prescription cream 5)  Please schedule a follow-up appointment 3-6 months, sooner if problems.    Immunization History:  Tetanus/Td Immunization History:    Tetanus/Td:  historical (03/27/2007)  Influenza Immunization  History:    Influenza:  historical (02/23/2009)  Pneumovax Immunization History:    Pneumovax:  historical (03/27/2007)

## 2010-04-27 NOTE — Miscellaneous (Signed)
Summary: update meds  Clinical Lists Changes  Medications: Changed medication from POTASSIUM CHLORIDE 20 MEQ PACK (POTASSIUM CHLORIDE) one tablet by mouth once daily to POTASSIUM CHLORIDE CRYS CR 20 MEQ CR-TABS (POTASSIUM CHLORIDE CRYS CR) Take one tablet by mouth daily Changed medication from ARMOUR THYROID 60 MG TABS (THYROID) 1 capsule once daily to ARMOUR THYROID 60 MG TABS (THYROID) Take 1 tablet by mouth once a day

## 2010-04-27 NOTE — Assessment & Plan Note (Signed)
Summary: per check out/sf    Visit Type:  Follow-up Referring Provider:  na Primary Provider:  Newt Lukes MD  CC:  No complains.  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  I In the interval he has undergone extensive eval for diarrhea ahd is being evaulated for carcinoid syndrome. From a cardiac standpoint his breathing is OK.  He is not doing as much becuase of diarrhea.  No chest pain.  NO PND.  Current Medications (verified): 1)  Pantoprazole Sodium 40 Mg Tbec (Pantoprazole Sodium) .Marland Kitchen.. 1 By Mouth Once Daily 2)  Armour Thyroid 60 Mg Tabs (Thyroid) .Marland Kitchen.. 1 Capsule Once Daily 3)  Lisinopril-Hydrochlorothiazide 10-12.5 Mg Tabs (Lisinopril-Hydrochlorothiazide) .Marland Kitchen.. 1 Tab Once Daily 4)  Multivitamins  Tabs (Multiple Vitamin) .... Take 1 Tablet By Mouth Once A Day 5)  Zocor 20 Mg Tabs (Simvastatin) .... Take One Tabled By Mouth Once A Day 6)  Flomax 0.4 Mg Xr24h-Cap (Tamsulosin Hcl) .Marland Kitchen.. 1 Tab Once Daily 7)  Fish Oil   Oil (Fish Oil) .Marland Kitchen.. 1 Tab Once Daily 8)  Niaspan 500 Mg Cr-Tabs (Niacin (Antihyperlipidemic)) .Marland Kitchen.. 1 By Mouth Every Day 9)  Bilberry 280mg  .... 1 Tab Once Daily 10)  Testosterone Cypionate 200 Mg/ml Oil (Testosterone Cypionate) .... Take 1 Injection Every Other Week 11)  Zenpep 20000 Unit Cpep (Pancrelipase (Lip-Prot-Amyl)) .Marland Kitchen.. 1 Three Times A Day With Meals 12)  Metoprolol Succinate 25 Mg Xr24h-Tab (Metoprolol Succinate) .Marland Kitchen.. 1 Tab Every Day 13)  Potassium Chloride 20 Meq Pack (Potassium Chloride) .... One Tablet By Mouth Once Daily 14)  Colestid 1 Gm Tabs (Colestipol Hcl) .... One Tablet By Mouth Once Daily 15)  Imodium A-D 2 Mg Tabs (Loperamide Hcl) .... One Tablet By Mouth As Needed  Allergies: 1)  ! Cipro  Past History:  Past medical, surgical, family and social histories (including risk factors) reviewed, and no  changes noted (except as noted below).  Past Medical History: Reviewed history from 10/06/2009 and no changes required. carcinoid syndrome (diarrhea), tumor location unclear - pending [facility] eval 10/26/09 Cardiomyopathy  Hyperlipidemia Hypertension IBS hypothyroid GERD with HH hx diverticulosis colon  MD roster: cards - Sanae Willetts GI - patterson uro - wrenn  endo - watkins  Past Surgical History: Reviewed history from 06/27/2009 and no changes required. Cholecystectomy lung surgery 1984 Umbilical Hernia Repair Tonsillectomy  Family History: Reviewed history from 06/27/2009 and no changes required. Family History of Prostate Cancer:brother Family History of Heart Disease: mother Family History of Colon Cancer:father Family History of Colon Polyps:Father - died age 71  Social History: Reviewed history from 06/27/2009 and no changes required. 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 -stopped in 1964 Alcohol Use - yes-occasional Illicit Drug Use - no Patient gets regular exercise.-walk 3 miles daily  Vital Signs:  Patient profile:   77 year old male Height:      67 inches Weight:      150.50 pounds BMI:     23.66 Pulse rate:   59 / minute Pulse rhythm:   regular Resp:     18 per minute BP sitting:   110 / 66  (left arm) Cuff size:   large  Vitals Entered By: Vikki Ports (October 10, 2009 3:27 PM)  Physical Exam  Additional Exam:  Patient is in NAD HEENT:  Normocephalic, atraumatic.  EOMI, PERRLA.  Neck: JVP is normal. No thyromegaly. No bruits.  Lungs: clear to auscultation. No rales no wheezes.  Heart: Regular rate and rhythm. Normal S1, S2. No S3.   No significant murmurs. PMI not displaced.  Abdomen:  Supple, nontender. Normal bowel sounds. No masses. No hepatomegaly.  Extremities:   Good distal pulses throughout. No lower extremity edema.  Musculoskeletal :moving all extremities.  Neuro:   alert and oriented  x3.    Impression & Recommendations:  Problem # 1:  CARDIOMYOPATHY (ICD-425.4) Patient continues to do well from a volume standpoint.  He has a presumed nonischemic cardiomyopathy as a myoview showed no ischemia or scar.  I would keep im on the same regimen.  From a cardiac standpoint he is at an acceptably low risk for possible surgery and is ok to proceed without further testing.  Problem # 2:  HYPERTENSION (ICD-401.9) Continue meds.  Problem # 3:  HYPERLIPIDEMIA (ICD-272.4) Keep on meds fro nowe. His updated medication list for this problem includes:    Zocor 20 Mg Tabs (Simvastatin) .Marland Kitchen... Take one tabled by mouth once a day    Niaspan 500 Mg Cr-tabs (Niacin (antihyperlipidemic)) .Marland Kitchen... 1 by mouth every day    Colestid 1 Gm Tabs (Colestipol hcl) ..... One tablet by mouth once daily  Other Orders: EKG w/ Interpretation (93000) Prescriptions: ARMOUR THYROID 60 MG TABS (THYROID) 1 capsule once daily  #90 x 3   Entered by:   Layne Benton, RN, BSN   Authorized by:   Sherrill Raring, MD, [facility]   Signed by:   Layne Benton, RN, BSN on 10/10/2009   Method used:   Faxed to ...       [facility] (mail-order)             , [place]         Ph: [phone]       Fax: [phone]   RxID:   [id number] POTASSIUM CHLORIDE 20 MEQ PACK (POTASSIUM CHLORIDE) one tablet by mouth once daily  #90 x 3   Entered by:   Layne Benton, RN, BSN   Authorized by:   Sherrill Raring, MD, [facility]   Signed by:   Layne Benton, RN, BSN on 10/10/2009   Method used:   Faxed to ...       [facility] (mail-order)             , [place]         Ph: [phone]       Fax: [phone]   RxID:   [id number] METOPROLOL SUCCINATE 25 MG XR24H-TAB (METOPROLOL SUCCINATE) 1 tab every day  #90 x 3   Entered by:   Layne Benton, RN, BSN   Authorized by:   Sherrill Raring, MD, [facility]   Signed by:   Layne Benton, RN, BSN on 10/10/2009   Method used:   Faxed to ...       [facility] (mail-order)             , [place]          Ph: [phone]       Fax: [phone]   RxID:   [id number] NIASPAN 500 MG CR-TABS (NIACIN (ANTIHYPERLIPIDEMIC)) 1 by mouth every day  #90 x 3   Entered by:   Layne Benton, RN, BSN   Authorized by:   Sherrill Raring, MD, [facility]   Signed by:   Layne Benton, RN, BSN on 10/10/2009   Method used:   Arneta Cliche  to .Marland KitchenMarland Kitchen       [facility] (mail-order)             , [place]         Ph: [phone]       Fax: [phone]   RxID:   [id number] ZOCOR 20 MG TABS (SIMVASTATIN) take one tabled by mouth once a day  #90 x 3   Entered by:   Layne Benton, RN, BSN   Authorized by:   Sherrill Raring, MD, [facility]   Signed by:   Layne Benton, RN, BSN on 10/10/2009   Method used:   Faxed to ...       [facility] (mail-order)             , [place]         Ph: [phone]       Fax: [phone]   RxID:   [id number] LISINOPRIL-HYDROCHLOROTHIAZIDE 10-12.5 MG TABS (LISINOPRIL-HYDROCHLOROTHIAZIDE) 1 tab once daily  #90 x 3   Entered by:   Layne Benton, RN, BSN   Authorized by:   Sherrill Raring, MD, [facility]   Signed by:   Layne Benton, RN, BSN on 10/10/2009   Method used:   Faxed to ...       [facility] (mail-order)             , [place]         Ph: [phone]       Fax: [phone]   RxID:   [id number]   Appended Document: per check out/sf EKG:  Normal sinus bradycardia.  59 bpm.  LAFB.

## 2010-04-27 NOTE — Letter (Signed)
Summary: [facility]  [facility]   Imported By: Lester Garber 02/27/2010 09:22:06  _____________________________________________________________________  External Attachment:    Type:   Image     Comment:   External Document

## 2010-04-27 NOTE — Progress Notes (Signed)
Summary: black stools   Phone Note Call from Patient Call back at Home Phone [phone]   Caller: Patient Call For: Jarold Motto Reason for Call: Talk to Nurse Summary of Call: Patient wants ot speak to nurse because stools are black again Initial call taken by: Tawni Levy,  August 26, 2009 4:19 PM  Follow-up for Phone Call        Pt states noticed stool were darker than usual today.  He believes it is from something he ate.  States he feels fine.  Per Dr. Jarold Motto observe for now.  GO to Er if worsens.  Pt given these instructions.   Follow-up by: Ashok Cordia RN,  August 26, 2009 4:38 PM

## 2010-04-27 NOTE — Assessment & Plan Note (Signed)
Summary: rov in june/sl  Medications Added METOPROLOL SUCCINATE 25 MG XR24H-TAB (METOPROLOL SUCCINATE) 1 tab every day      Allergies Added:   Visit Type:  Follow-up Referring Provider:  Dietrich Pates, MD Primary Provider:  Newt Lukes MD  CC:  no complaints.  History of Present Illness:  Mr. Pfarr is a 77 year old gentleman with a history of presumed nonischemic cardiomyopathy. I last saw him in November.  since then he denies chest pains, no shortness of breath, no palpitations.  he is undergoing an extensive GI eval for possible melena.  Due to have pill endoscopy later this wk  Current Medications (verified): 1)  Pantoprazole Sodium 40 Mg Tbec (Pantoprazole Sodium) .Marland Kitchen.. 1 By Mouth Once Daily 2)  Armour Thyroid 60 Mg Tabs (Thyroid) .Marland Kitchen.. 1 Capsule Once Daily 3)  Lisinopril-Hydrochlorothiazide 10-12.5 Mg Tabs (Lisinopril-Hydrochlorothiazide) .Marland Kitchen.. 1 Tab Once Daily 4)  Multivitamins  Tabs (Multiple Vitamin) .... Take 1 Tablet By Mouth Once A Day 5)  Zocor 20 Mg Tabs (Simvastatin) .... Take One Tabled By Mouth Once A Day 6)  Flomax 0.4 Mg Xr24h-Cap (Tamsulosin Hcl) .Marland Kitchen.. 1 Tab Once Daily 7)  Fish Oil   Oil (Fish Oil) .Marland Kitchen.. 1 Tab Once Daily 8)  Niaspan 500 Mg Cr-Tabs (Niacin (Antihyperlipidemic)) .Marland Kitchen.. 1 By Mouth Every Day 9)  B-12 (Unknown Dosage) .... Take 1 Tablet By Mouth Once A Day 10)  Vitamin D (Unknown Dosage) .... Take 1 Tablet By Mouth Once A Day 11)  Bilberry 280mg  .... 1 Tab Once Daily 12)  Pure Encapsulations Vitamin .Marland Kitchen.. 1 Daily 13)  Coqmax Dietary Supplement .Marland Kitchen.. 1 Tab Once Daily 14)  Digestzymes .... Take 1 Tablet By Mouth Once A Day 15)  Soulbee Fiber Formular Vitamin .Marland Kitchen.. 1 Daily 16)  Testosterone Cypionate 200 Mg/ml Oil (Testosterone Cypionate) .... Take 1  Injection Q  Week 17)  Zenpep 20000 Unit Cpep (Pancrelipase (Lip-Prot-Amyl)) .Marland Kitchen.. 1 Three Times A Day With Meals  Allergies (verified): 1)  ! Cipro  Past History:  Past medical, surgical, family and  social histories (including risk factors) reviewed, and no changes noted (except as noted below).  Past Medical History: Reviewed history from 07/19/2009 and no changes required. Cardiomyopathy  Hyperlipidemia Hypertension IBS hypothyroid GERD with HH hx diverticulosis colon  MD rooster: cards - Sherial Ebrahim GI - patterson uro - wrenn  endo - watkins  Past Surgical History: Reviewed history from 06/27/2009 and no changes required. Cholecystectomy lung surgery 1984 Umbilical Hernia Repair Tonsillectomy  Family History: Reviewed history from 06/27/2009 and no changes required. Family History of Prostate Cancer:brother Family History of Heart Disease: mother Family History of Colon Cancer:father Family History of Colon Polyps:Father - died age 63  Social History: Reviewed history from 06/27/2009 and no changes required. 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 -stopped in 1964 Alcohol Use - yes-occasional Illicit Drug Use - no Patient gets regular exercise.-walk 3 miles daily  Vital Signs:  Patient profile:   77 year old male Height:      67 inches Weight:      154 pounds BMI:     24.21 Pulse rate:   71 / minute BP sitting:   126 / 76  (left arm)  Vitals Entered By: Burnett Kanaris, CNA (Aug 15, 2009 2:45 PM)  Physical Exam  Additional Exam:  Patient is in NAD> HEENT:  Normocephalic, atraumatic. EOMI, PERRLA.  Neck: JVP is normal. No thyromegaly. No bruits.  Lungs: clear to auscultation. No  rales no wheezes.  Heart: Regular rate and rhythm. Normal S1, S2. No S3.   No significant murmurs. PMI not displaced.  Abdomen:  Supple, nontender. Normal bowel sounds. No masses. No hepatomegaly.  Extremities:   Good distal pulses throughout. No lower extremity edema.  Musculoskeletal :moving all extremities.  Neuro:   alert and oriented x3.    Impression & Recommendations:  Problem # 1:  CHF (ICD-428.0) Probable nonischemic  cardiomyopathy.  Doing well   I would add low dose b blocker (toprol XL 12.5 to 25).  See how tolerates.  F/U later thiss summer.  Problem # 2:  HYPERLIPIDEMIA (ICD-272.4) Continue. His updated medication list for this problem includes:    Zocor 20 Mg Tabs (Simvastatin) .Marland Kitchen... Take one tabled by mouth once a day    Niaspan 500 Mg Cr-tabs (Niacin (antihyperlipidemic)) .Marland Kitchen... 1 by mouth every day  Patient Instructions: 1)  Your physician recommends that you schedule a follow-up appointment in: 6 to 7 weeks Prescriptions: METOPROLOL SUCCINATE 25 MG XR24H-TAB (METOPROLOL SUCCINATE) 1 tab every day  #90 x 3   Entered by:   Layne Benton, RN, BSN   Authorized by:   Sherrill Raring, MD, [facility]   Signed by:   Layne Benton, RN, BSN on 08/15/2009   Method used:   Electronically to        [facility]* (mail-order)             ,          Ph: [phone]       Fax: [phone]   RxID:   [id number] LISINOPRIL-HYDROCHLOROTHIAZIDE 10-12.5 MG TABS (LISINOPRIL-HYDROCHLOROTHIAZIDE) 1 tab once daily  #90 x 3   Entered by:   Layne Benton, RN, BSN   Authorized by:   Sherrill Raring, MD, [facility]   Signed by:   Layne Benton, RN, BSN on 08/15/2009   Method used:   Electronically to        [facility]* (mail-order)             ,          Ph: [phone]       Fax: [phone]   RxID:   [id number]

## 2010-04-27 NOTE — Progress Notes (Signed)
Summary: PT RETURNING CALL   Phone Note Call from Patient Call back at [facility] Phone [phone]   Caller: Patient Complaint: Urinary/GYN Problems Summary of Call: PT ERTURNING CALL Initial call taken by: Judie Grieve,  April 13, 2009 11:35 AM  Follow-up for Phone Call        Called patient back and set him up with Dr.Valerie Felicity Coyer to establish as a new patient. Follow-up by: Suzan Garibaldi RN

## 2010-05-02 ENCOUNTER — Other Ambulatory Visit: Payer: Self-pay | Admitting: Internal Medicine

## 2010-05-02 ENCOUNTER — Encounter (INDEPENDENT_AMBULATORY_CARE_PROVIDER_SITE_OTHER): Payer: Self-pay | Admitting: *Deleted

## 2010-05-02 ENCOUNTER — Other Ambulatory Visit: Payer: Medicare Other

## 2010-05-02 DIAGNOSIS — E782 Mixed hyperlipidemia: Secondary | ICD-10-CM

## 2010-05-02 DIAGNOSIS — I509 Heart failure, unspecified: Secondary | ICD-10-CM

## 2010-05-02 LAB — LIPID PANEL: VLDL: 20.4 mg/dL (ref 0.0–40.0)

## 2010-05-02 LAB — BASIC METABOLIC PANEL
CO2: 28 mEq/L (ref 19–32)
Calcium: 8.9 mg/dL (ref 8.4–10.5)
Chloride: 105 mEq/L (ref 96–112)
Sodium: 142 mEq/L (ref 135–145)

## 2010-05-02 LAB — AST: AST: 18 U/L (ref 0–37)

## 2010-05-03 NOTE — Assessment & Plan Note (Signed)
Summary: per check out, F6M/SF/PE   Vital Signs:  Patient profile:   77 year old male Height:      67 inches Weight:      161.50 pounds BMI:     25.39 Pulse rate:   50 / minute Pulse rhythm:   regular Resp:     18 per minute BP sitting:   130 / 68  (left arm) Cuff size:   large  Vitals Entered By: Vikki Ports (April 27, 2010 9:30 AM)  Visit Type:  6 months follow up Referring Provider:  na Primary Provider:  Newt Lukes MD  CC:  No complaints.  History of Present Illness: Patient is an 77 year old with a history of presumed NICM (norml perfusion on myoview), hypertension and dyslipidemia.  I saw him last in July SInce I saw him he has been doing fairly well.  He walks 1 1/2 miles 3x per wk.  Works out at [employer] with [employer].  Notes some slowing over the past year which he attrib to age.  Denies CP.   Was seen by C. Guest in Dec.  LAsix every other day was added with K for some LE edema.  No record. He denies excess SOB. Diarrheaa is gone.  Due to bacterial overgrowth Notes son 11 had cardiac arrest.  Resuscitated.  Stent.  Neuro is good. 2nd son after hearing about this had a cath.  Also has a stent  Current Medications (verified): 1)  Pantoprazole Sodium 40 Mg Tbec (Pantoprazole Sodium) .Marland Kitchen.. 1 By Mouth Once Daily 2)  Armour Thyroid 60 Mg Tabs (Thyroid) .... Take 1 Tablet By Mouth Once A Day 3)  Lisinopril-Hydrochlorothiazide 10-12.5 Mg Tabs (Lisinopril-Hydrochlorothiazide) .Marland Kitchen.. 1 Tab Once Daily 4)  Multivitamins  Tabs (Multiple Vitamin) .... Take 1 Tablet By Mouth Once A Day 5)  Zocor 20 Mg Tabs (Simvastatin) .... Take One Tabled By Mouth Once A Day 6)  Flomax 0.4 Mg Xr24h-Cap (Tamsulosin Hcl) .Marland Kitchen.. 1 Tab Once Daily 7)  Fish Oil   Oil (Fish Oil) .Marland Kitchen.. 1 Tab Once Daily 8)  Bilberry 280mg  .... 1 Tab Once Daily 9)  Testosterone Cypionate 200 Mg/ml Oil (Testosterone Cypionate) .... Take 1 Injection Every Other Week 10)  Metoprolol Succinate 25 Mg Xr24h-Tab  (Metoprolol Succinate) .Marland Kitchen.. 1 Tab Every Day 11)  Imodium A-D 2 Mg Tabs (Loperamide Hcl) .... One Tablet By Mouth As Needed 12)  Furosemide 20 Mg Tabs (Furosemide) .... Every Other Day 13)  Soluble Fiber Formula .Marland Kitchen.. 1 Tsp Daily  Allergies: 1)  ! Cipro  Past History:  Past medical, surgical, family and social histories (including risk factors) reviewed, and no changes noted (except as noted below).  Past Medical History:  Cardiomyopathy  Hyperlipidemia Hypertension IBS hypothyroid GERD with HH hx diverticulosis colon Hx diarrhea  MD roster: cards - Daneka Lantigua GI - patterson uro - wrenn  endo - watkins  Past Surgical History: Reviewed history from 06/27/2009 and no changes required. Cholecystectomy lung surgery 1984 Umbilical Hernia Repair Tonsillectomy  Family History: Reviewed history from 06/27/2009 and no changes required. Family History of Prostate Cancer:brother Family History of Heart Disease: mother Family History of Colon Cancer:father Family History of Colon Polyps:Father - died age 92  Social History: Reviewed history from 06/27/2009 and no changes required. 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 -stopped in 1964 Alcohol Use - yes-occasional Illicit Drug Use - no Patient gets regular exercise.-walk 3 miles daily  Review of  Systems       Systems reviewed.  Neg tothe above problme except as noted above.  Physical Exam  Additional Exam:  Patient is in NAD HEENT:  Normocephalic, atraumatic. EOMI, PERRLA.  Neck: JVP is normal. No thyromegaly. No bruits.  Lungs: clear to auscultation. No rales no wheezes.  Heart: Regular rate and rhythm. Normal S1, S2. No S3.   No significant murmurs. PMI not displaced.  Abdomen:  Supple, nontender. Normal bowel sounds. No masses. No hepatomegaly.  Extremities:   Good distal pulses throughout. Trace. lower extremity edema.  Musculoskeletal :moving all extremities.    Neuro:   alert and oriented x3.    Impression & Recommendations:  Problem # 1:  CHF (ICD-428.0) Volume status looks good.  Is on a K supplement.   Will get records for C Guest.   Will check labs.   Discussed sons.  I am not convinced pt has signif CAD.  Active.  No signif problems.  Would need severe 3 V dz to explain LV decrease and would not be able to do the things he does if he had this.  Myoview is normal.  Will review but not sched anything else for now. Stay active.  Call if symptoms change. His updated medication list for this problem includes:    Lisinopril-hydrochlorothiazide 10-12.5 Mg Tabs (Lisinopril-hydrochlorothiazide) .Marland Kitchen... 1 tab once daily    Metoprolol Succinate 25 Mg Xr24h-tab (Metoprolol succinate) .Marland Kitchen... 1 tab every day    Furosemide 20 Mg Tabs (Furosemide) ..... Every other day  Problem # 2:  HYPERLIPIDEMIA (ICD-272.4) Chek lipids. The following medications were removed from the medication list:    Niaspan 500 Mg Cr-tabs (Niacin (antihyperlipidemic)) .Marland Kitchen... 1 by mouth every day    Colestid 1 Gm Tabs (Colestipol hcl) ..... One tablet by mouth once daily His updated medication list for this problem includes:    Zocor 20 Mg Tabs (Simvastatin) .Marland Kitchen... Take one tabled by mouth once a day  Problem # 3:  HYPERTENSION (ICD-401.9) Adequate. His updated medication list for this problem includes:    Lisinopril-hydrochlorothiazide 10-12.5 Mg Tabs (Lisinopril-hydrochlorothiazide) .Marland Kitchen... 1 tab once daily    Metoprolol Succinate 25 Mg Xr24h-tab (Metoprolol succinate) .Marland Kitchen... 1 tab every day    Furosemide 20 Mg Tabs (Furosemide) ..... Every other day  Other Orders: EKG w/ Interpretation (93000)  Patient Instructions: 1)  Your physician recommends that you return for a FASTING lipid profile: lipid ast bmet bnp 428.0 272.2 schedule on Tuesday 2/7 at [facility]. 2)  Your physician wants you to follow-up in:6 months   You will receive a reminder letter in the mail two months in  advance. If you don't receive a letter, please call our office to schedule the follow-up appointment.   Orders Added: 1)  EKG w/ Interpretation [93000]  Appended Document: per check out, F6M/SF/PE EKG:  SB  51 bpm.  LAFB.  LVH.

## 2010-05-09 ENCOUNTER — Telehealth: Payer: Self-pay | Admitting: Internal Medicine

## 2010-05-17 NOTE — Progress Notes (Signed)
Summary: blood work results  Phone Note Call from Patient Call back at [employer] [phone]   Caller: Patient Reason for Call: Talk to Nurse Summary of Call: pt wants to know if we can fax blood work results to dr. guess office at [phone].  Initial call taken by: Roe Coombs,  May 09, 2010 11:44 AM  Follow-up for Phone Call        pt aware results faxed to Dr Perrin Maltese as requested Follow-up by: Charolotte Capuchin, RN,  May 09, 2010 12:30 PM

## 2010-06-26 ENCOUNTER — Other Ambulatory Visit: Payer: Self-pay | Admitting: Diagnostic Neuroimaging

## 2010-06-26 DIAGNOSIS — J3489 Other specified disorders of nose and nasal sinuses: Secondary | ICD-10-CM

## 2010-06-27 ENCOUNTER — Ambulatory Visit
Admission: RE | Admit: 2010-06-27 | Discharge: 2010-06-27 | Disposition: A | Discharge status: Home / Self Care | Payer: Medicare Other | Source: Ambulatory Visit | Attending: Diagnostic Neuroimaging | Admitting: Diagnostic Neuroimaging

## 2010-06-27 DIAGNOSIS — J3489 Other specified disorders of nose and nasal sinuses: Secondary | ICD-10-CM

## 2010-07-05 LAB — COMPREHENSIVE METABOLIC PANEL
ALT: 22 U/L (ref 0–53)
AST: 23 U/L (ref 0–37)
Albumin: 3.7 g/dL (ref 3.5–5.2)
Alkaline Phosphatase: 125 U/L — ABNORMAL HIGH (ref 39–117)
BUN: 19 mg/dL (ref 6–23)
GFR calc Af Amer: >60 mL/min (ref >60)
Potassium: 5.1 mEq/L (ref 3.5–5.1)
Sodium: 142 mEq/L (ref 135–145)
Total Protein: 6.3 g/dL (ref 6.0–8.3)

## 2010-07-05 LAB — CBC
MCV: 93.2 fL (ref 78.0–100.0)
RBC: 4.73 MIL/uL (ref 4.22–5.81)
WBC: 8.5 10*3/uL (ref 4.0–10.5)

## 2010-07-05 LAB — DIFFERENTIAL
Basophils Relative: 1 % (ref 0–1)
Eosinophils Relative: 2 % (ref 0–5)
Monocytes Absolute: 0.8 10*3/uL (ref 0.1–1.0)
Monocytes Relative: 9 % (ref 3–12)
Neutro Abs: 6 10*3/uL (ref 1.7–7.7)

## 2010-07-05 LAB — URINALYSIS, ROUTINE W REFLEX MICROSCOPIC
Hgb urine dipstick: NEGATIVE
Specific Gravity, Urine: 1.03 (ref 1.005–1.030)
Urobilinogen, UA: 0.2 mg/dL (ref 0.0–1.0)

## 2010-07-06 ENCOUNTER — Other Ambulatory Visit: Payer: Self-pay | Admitting: *Deleted

## 2010-07-06 ENCOUNTER — Other Ambulatory Visit: Payer: Self-pay | Admitting: Gastroenterology

## 2010-07-06 MED ORDER — PANTOPRAZOLE SODIUM 40 MG PO TBEC: 40.0000 mg | DELAYED_RELEASE_TABLET | Freq: Every day | ORAL | Status: DC

## 2010-07-06 NOTE — Telephone Encounter (Signed)
rx sent to [facility] 

## 2010-08-08 NOTE — H&P (Signed)
[name], SCOTT NO.:  [id number]   MEDICAL RECORD NO.:  [id number]          PATIENT TYPE:  INP   LOCATION:  3302                         FACILITY:  [facility]   PHYSICIAN:  Alanda Amass, M.D.   DATE OF BIRTH:  1921/04/14   DATE OF ADMISSION:  08/01/2006  DATE OF DISCHARGE:                              HISTORY & PHYSICAL   PRIMARY CARE PHYSICIAN:  [facility], Dr. Perrin Maltese.   The patient's wife's contact information is [phone].  Her name is  [place].   CHIEF COMPLAINT:  Fevers and chills.   HISTORY OF PRESENT ILLNESS:  The patient is an 77 year old previously  healthy male admitted from [place] with pneumonia, UTI, and failed  outpatient treatment with ceftriaxone x2 injections and Cipro.  He was  healthy until Jul 26, 2006, when he developed fevers and chills and  shakes.  He went to [place] on Jul 28, 2006, and had a UA suspicious for a  UTI.  Culture showed less than 10,000 colonies and mixture of general  flora at that time.  White blood cell count was 14.3 with an ANC of  11.5, a left shift, 80.5% neutrophils.  He also had a normal PSA.  He  had a creatinine of 1.53.  He had some slightly increased LFTs.  He was  started on Cipro 2 times a day, as well as given an injection of  ceftriaxone at this time and another one 24 hours later.  He then went  to Dr. Annabell Howells, the urologist, today and demanded a referral through  [facility] to see him.  He had an ultrasound of his bladder and kidneys that  was normal, but his creatinine was increased at 3.8.  His BUN was 75.  AST was 41, ALT was 46, ALP was 218, which these were better LFTs than  prior.  He was then sent back to [place], given 1500 mL bolus of IV  fluids because of his increased creatinine.  Repeat UA was done, showing  moderate LE, trace bacteria, 20-25 white blood cells, specific gravity  1.030, trace blood, 0-2 red blood cells, small bili, protein 100.  White  blood cell count was 12.5, ANC was 9.1.  A  chest x-ray was done, which  showed pulmonary infiltrates.  Also, the patient had some decreased  blood pressure at 89/52 prior to the fluids.  That improved to 108/62.  Heart rate was 70s.  The patient had a morbilliform rash on his chest  and back, which was also noticed at [place] today.  He denies any itching  or pain of this rash.  Denies any cough.  No chest pain.  No shortness  of breath.  No congestion.  Has been complaining of dribbling urine for  1 week.  There has been some improvement since he started the  antibiotics.  He is not having fevers now, and he does feel a little bit  better.   PAST MEDICAL HISTORY:  The patient goes to see Dr. Boris Sharper on [facility],  who is, I believe, a homeopathic doctor of some sort.  He does  not  remember the medications he prescribes to him, and last saw this  physician in March.   Medications that he does remember include:  1. Cipro 500 mg 1 tablet p.o. b.i.d. was started on Jul 28, 2006.  2. Tylenol and ibuprofen, which he has been alternating for fevers and      pretty much scheduling this dose.  3. Nexium 1 tablet p.o. daily, but has not taken any all week.  4. Some kind of red rice supplement for cholesterol.  5. Multivitamin.  6. Takes a lot of supplements he does not remember.  7. Some kind of thyroid medication.  He has not taken it in 2 weeks.   ALLERGIES:  No known drug allergies.   PAST MEDICAL HISTORY:  1. Umbilical hernia.  2. History of thyroid problems.  3. Hiatal hernia/reflux.  4. He sees Dr. Jarold Motto with GI.  He had a normal colonoscopy 2 years      ago.   FAMILY HISTORY:  Dad had a colon cancer.  Died at 20 years old.  Mom  died in 8.  No problems.  Younger brother:  Prostate cancer, which  led to bone cancer.  Died at 20 years old.  Sisters:  Arthritis in one.   SOCIAL HISTORY:  Not smoking now.  Stopped 40 years ago.  Smoked for 24  years greater than 3-1/2 packs per day.  Alcohol:  One-half bottle of  beer  about 1-2 times per week, 2 bottles per week total.  No illegal  drugs.  Lives in [place] with his wife.  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  Exercises daily, routine stuff around the  house, and walks at the [facility].   REVIEW OF SYSTEMS:  Positive for fevers.  Some decreased weight,  approximately 10 pounds in the last week/month, but he has been trying  to lose some weight.  Decreased appetite for 1 week.  Cold at nights,  but leaves the windows open.  No nausea and vomiting now.  Did have a  slight bit of vomiting earlier this week.  No dysuria.  No hematuria.  No bright red blood per rectum.  Does have some weakness that was mainly  in his legs before.  No back pain.  Some decreased urine output, as  well.  Wife feels like she has also noticed a change in his temperament,  especially given the fact he was demanding to see the urologist.   PHYSICAL EXAM:  VITAL SIGNS:  Temperature is 98.8.  Respiratory rate 20.  Blood pressure 107/54.  Heart rate 76, and normal sinus rhythm; 94% on  room air.  GENERAL:  The patient is alert, in no acute distress.  HEENT:  Pupils equal, round and reactive to light and accommodation.  Extraocular muscles intact.  Oropharynx without exudates or erythema.  Dry mucous membranes and tongue.  NECK:  No lymphadenopathy.  CARDIOVASCULAR:  Heart regular rate and rhythm.  No murmurs, rubs or  gallops.  PULMONARY:  Crackles, right greater than left, posterior fields.  No  accessory muscle use.  No retractions.  No increased work of breathing.  ABDOMEN:  Soft, nondistended, nontender.  No masses.  Positive for bowel  sounds.  EXTREMITIES:  Radial and pedal pulses, +2.  No edema.  Strength 5/5.  SKIN:  Red, confluent, raised, morbilliform rash on chest and back.  Decreased skin turgor.  Cap refill greater than 3 seconds.   LABS/STUDIES:  Chest x-ray:  Infiltrate  possibly in the left lower lobe. Blunting of the costophrenic angles.  Patchy  infiltrates throughout.   ASSESSMENT AND PLAN:  The patient is an 77 year old male with pneumonia  likely and probable urinary tract infection and dehydration and acute  renal insufficiency who failed an outpatient treatment of urinary tract  infection.  Also, with a past history of thyroid problems.  1. Urinary tract infection:  We will check another UA here, Gram stain      and a culture since there was no identification in the last urine      culture at [facility].  The patient is not complaining of any dysuria,      back pain, and his bladder and kidney scans were normal.  We will      give Zosyn to cover Gram negatives, as well as his pneumonia.  We      are treating him for a urinary tract infection since his urine did      appear very much like there was an infection on his UA, as well as      in the setting of fevers and chills.  Given his history of      dribbling urine, as he is unable to urinate, we will place a Foley.  2. Pneumonia, community acquired:  Since not adequately treated with      Cipro and ceftriaxone, we will start Zosyn to broaden coverage to      get Gram-negative and -positive organisms and cover the urinary      tract infection, as well, with this.  We will also add azithromycin      to cover atypical bugs.  We will repeat a chest x-ray in the      morning since hydration may change the appearance of this.  We will      also follow CBCs and his fever curve.  We will give Tylenol p.r.n.      for fever.  3. Rash:  We will discontinue his ceftriaxone and his Cipro since this      could be a drug reaction to these.  4. Acute renal insufficiency/dehydration:  The patient appears very      dry and is likely having some acute renal insufficiency due to      this.  We will hydrate him with 500 mL of normal saline bolus, and      then run it 200 mL per hour of D-5 half normal saline.  His kidney      ultrasound looked okay.  It is still possible that his urinary       tract infection could have caused some kidney issues, as well as      urinary retention could also be causing this.  5. History of thyroid troubles:  We will check a TSH, a free T3 and a      free T4, as this thyroid problem could also have to do with his hot      and cold flashes.  6. Decreased blood pressure:  The patient is stable and being given      hydration.  We will continue to monitor this.  7. Reflux:  We will continue him on Protonix.  8. Prophylaxis:  Sequential compression devices if in bed.  9. Increased LFTs:  These are resolving.  We will recheck a CMP in the      morning.  10.Disposition:  We will likely transition him to a floor bed in the  morning if his blood pressure improves with hydration.           ______________________________  Alanda Amass, M.D.     JH/MEDQ  D:  08/02/2006  T:  08/02/2006  Job:  [id number]

## 2010-08-08 NOTE — Assessment & Plan Note (Signed)
[facility]                            CARDIOLOGY OFFICE NOTE   SIE, FORMISANO                      MRN:          [id number]  DATE:05/06/2008                            DOB:          1921-10-29    IDENTIFICATION:  Mr. Gladu is an 77 year old gentleman.  I last saw him  back in November for skips and edema.  He was referred by Dr. Perrin Maltese.   When I saw the patient, I had scheduled an echocardiogram.  This was  done on November 27, it showed an LVEF of 25-30%.  Note, there were  frequent skips during this study.  He also had mild mitral  regurgitation.   With his decreased LVEF and his age, I scheduled a Myoview study.  He  exercised on the Bruce protocol for 5-1/2 minutes.  There were frequent  PVCs noted, couplets.  There was normal perfusion with a very small  apical defect, scar versus thinning.  No ischemia.  Blood work at that  time also was unremarkable.  TSHs have been done previously in December  was normal.  His lipids did show an LDL of 97, but his HDL was low at  29.  BMP was minimally increased at 289.   The patient comes in for today for return.  Note, there is some  consideration to have abdominal ventral hernia repair.   CURRENT MEDICINES:  1. Niaspan 500 daily.  2. HCTZ 12.5.  3. Nexium 40.  4. Flomax 0.4.  5. Synthroid 60 daily.  6. __________ plus B12.  7. Multivitamin.  8. Vitamin D.  9. Glucan nutritional supplements.  10.Red yeast rice.  11.DHEA enzymes?   PHYSICAL EXAMINATION:  GENERAL:  On exam, the patient is in no distress.  VITAL SIGNS:  Blood pressure is elevated 170/95 on my check 164/70,  pulse 45 and irregular, weight 163 up 3 pounds.  NECK:  JVP appears normal.  LUNGS:  Clear.  CARDIAC:  Regular rate and rhythm with frequent skips S1-S2.  No  definite S3.  ABDOMEN:  Benign.  EXTREMITIES:  No edema.   A 12-lead EKG was done that showed sinus rhythm 60s with PVCs on  monitor.  There were some  skips.   IMPRESSION:  Mr. Mccauley is an 77 year old gentleman with probable  nonischemic cardiomyopathy, clinically doing very well.  He is active,  continues to go with [employer] few times per week.  On exam, his fluid status  overall looks pretty good.  Blood pressure that was high.   What I would recommend is beginning lisinopril 10 mg daily and have him  set up for BMET and the blood pressure check.  He is due to go on a  cruise and I do not want to push things further reluctant to add a beta  blockers as his resting pulse is low, though I think it is missed  because of the skips.  This can be assessed later.   I talked to the patient about his cardiomyopathy, again most likely  nonischemic.  I would not pursue cardiac catheterization.  I discussed  with  him possible risk for malignant arrhythmias and ICD.  We will  rediscuss this when I see him again, he is reflecting.  For now continue  medical therapy.   I think it is okay to go ahead with his planned cruise.  I will see him  after.  I also think from a surgical standpoint, he is at low risk for  major cardiac event, though he has had some risk for increased volume  and this will need to be watched during the ventral hernia repair.  Again, Myoview showed no ischemia.     Pricilla Riffle, MD, [facility]  Electronically Signed    PVR/MedQ  DD: 05/06/2008  DT: 05/07/2008  Job #: [id number]

## 2010-08-08 NOTE — Consult Note (Signed)
[name], [name]               ACCOUNT NO.:  [id number]   MEDICAL RECORD NO.:  [id number]          PATIENT TYPE:  OIB   LOCATION:  2603                         FACILITY:  [facility]   PHYSICIAN:  Hillis Range, MD       DATE OF BIRTH:  March 29, 1921   DATE OF CONSULTATION:  07/01/2008  DATE OF DISCHARGE:                                 CONSULTATION   REQUESTING PHYSICIAN:  Currie Paris, MD   REASON FOR CONSULTATION:  Bradycardia.   HISTORY OF PRESENT ILLNESS:  [name] is a pleasant 77 year old  gentleman with a nonischemic cardiomyopathy (ejection fraction 25-30%),  New York Heart Association class I/II heart failure, and chronic benign  premature ventricular contractions who is recovering status post  umbilical hernia repair earlier today.  During the postop recovery  phase, he was noted to have some bradycardia with heart rates as low as  42 beats per minute.  He remained hemodynamically stable with a blood  pressure in 140s/50s throughout.  He has done well since that time.  The  patient's heart rate is now improved to the 60s though he continues to  have occasional premature ventricular contractions.  He has longstanding  PVCs for which he has been followed in the office by Dr. Tenny Craw in the  past.  He presently is recovering nicely from his procedure.  He denies  chest pain, short of breath, palpitations, presyncope, syncope, fatigue,  or any other symptoms.   PAST MEDICAL HISTORY:  1. Nonischemic cardiomyopathy.  2. New York Heart Association class I/II heart failure.  3. PVCs.  4. GERD.  5. BPH.  6. Hypothyroidism.  7. Umbilical hernia status post repair today.   ALLERGIES:  No known drug allergies.   HOME MEDICATIONS:  1. Nexium 40 mg daily.  2. Flomax 0.4 mg daily.  3. Lisinopril 10 mg daily.  4. Vitamin B12.  5. Multivitamin daily.  6. Fish oil daily.  7. Vitamin D daily.  8. Aspirin 81 mg daily.   SOCIAL HISTORY:  The patient lives in [place].  He  has a history of  tobacco but quit 40 years ago.  He drinks one-half bottle of beer  several times per week.  He denies drug use.   FAMILY HISTORY:  Colon cancer and prostate cancer.   REVIEW OF SYSTEMS:  All systems are reviewed and negative except as  outlined in the HPI above.   PHYSICAL EXAMINATION:  VITAL SIGNS:  Blood pressure 165/73, heart rate  59, respirations 18, saturations 95% on room air, and afebrile.  GENERAL:  The patient is a well-appearing gentleman in no acute  distress.  He is alert and oriented x3.  HEENT:  Normocephalic and atraumatic.  Sclerae clear.  Conjunctivae  pink.  Oropharynx is clear.  NECK:  Supple.  No JVD, thyromegaly, or bruits.  LUNGS:  Clear to auscultation bilaterally.  HEART:  Regular rate and rhythm.  No murmurs, rubs, or gallops.  GI:  Soft, nontender, and nondistended.  Positive bowel sounds.  EXTREMITIES:  No clubbing, cyanosis, or edema.  NEUROLOGIC:  Strength and sensation are  intact.  SKIN:  No ecchymosis or lacerations.  MUSCULOSKELETAL:  No deformity or atrophy.  PSYCH:  Euthymic mood.  Full affect.   Telemetry reveals sinus rhythm at 62 beats per minute with occasional  PVCs.   Labs are reviewed from April 6.   IMPRESSION:  [name] is a pleasant 77 year old gentleman who is  recovering nicely status post umbilical hernia repair.  He was noted to  have asymptomatic sinus bradycardia in the postoperative phase which  appears to be resolving.  He has also had occasional premature  ventricular contractions which are his baseline.  The patient is  completely without any symptoms of ischemia, heart failure, or  arrhythmia at this time.  I therefore do not feel that further cardiac  evaluation is necessary.  I will continue the patient on his home  medications at the time of discharge.  Should any new problems arise,  please feel free to call.      Hillis Range, MD  Electronically Signed     JA/MEDQ  D:  07/01/2008  T:   07/02/2008  Job:  [id number]   cc:   Pricilla Riffle, MD, [facility]  Currie Paris, M.D.

## 2010-08-08 NOTE — Op Note (Signed)
[name], [name]               ACCOUNT NO.:  [id number]   MEDICAL RECORD NO.:  [id number]          PATIENT TYPE:  OIB   LOCATION:  2603                         FACILITY:  [facility]   PHYSICIAN:  Currie Paris, M.D.DATE OF BIRTH:  [birth year]   DATE OF PROCEDURE:  DATE OF DISCHARGE:                               OPERATIVE REPORT   PREOPERATIVE DIAGNOSIS:  Umbilical hernia.   POSTOPERATIVE DIAGNOSIS:  Umbilical hernia.   OPERATION:  Repair of umbilical hernia with mesh.   SURGEON:  Currie Paris, MD   ANESTHESIA:  General endotracheal.   CLINICAL HISTORY:  This is an 77 year old gentleman who has an umbilical  hernia that has been getting larger and has developed some very small  ischemic changes of the overlying skin.  We elected to proceed to repair  because of the impending skin changes.   DESCRIPTION OF PROCEDURE:  The patient was seen in the holding area and  he had no further questions.  We confirmed umbilical hernia repair as  the planned procedure.   The patient was taken to the operating room, and after satisfactory  general endotracheal anesthesia had been obtained, the abdomen was  prepped and draped.  The time-out was done.   To help with postoperative pain control, I injected about 10 mL of 0.25%  plain Marcaine into the skin and subcutaneous tissues around the  umbilical hernia.  I then made a curvilinear incision inferior to the  umbilicus.   The skin was elevated and the hernia sac entered.  There was omentum  protruding out.  This was freed up, and the hernia sac identified  circumferentially and measured about 1.5 cm across.  I was able to free  up the omentum where it was stuck to the peritoneum just underneath so I  could put a piece of mesh in.   We then placed the small PROCEED ventral patch in and this laid nicely  and covered the defect well.  The straps were sutured to the fascia with  0 Prolene.  Then, the fascia was closed over the  mesh with 0 Prolene  interrupted sutures.   Everything appeared to be dry.  I put some additional local into the  deeper tissues.  I closed the subcu with 3-0 Vicryl and the skin with 4-  0 Monocryl subcuticular plus Dermabond.   The patient tolerated the procedure well.  There were no operative  complications.  All counts were correct.      Currie Paris, M.D.  Electronically Signed     CJS/MEDQ  D:  07/01/2008  T:  07/01/2008  Job:  [id number]   cc:   Jonita Albee, M.D.  Pricilla Riffle, MD, [facility]

## 2010-08-08 NOTE — Assessment & Plan Note (Signed)
[facility]                            CARDIOLOGY OFFICE NOTE   DARVELL, MONTEFORTE                      MRN:          [id number]  DATE:02/09/2008                            DOB:          Aug 22, 1921    IDENTIFICATION:  Mr. Hedberg is an 77 year old gentleman who was referred  for evaluation of edema and also skips.   HISTORY OF PRESENT ILLNESS:  The patient has no known history of  coronary artery disease.  On talking to him, he said he was active until  his wife got sick.  He started caring for her.  She died several weeks  ago.  Since that time, he has begun increasing his physical activity.  [place] is now going to [employer], he walks a mile, bikes.  He is also swimming four  lengths at the pole.  With all this, he denies chest pain.  He does get  some short of breath, but he attributed that to being out of shape.   He was [facility] by Dr. Perrin Maltese,  complained about some ankle swelling.  EKG was done that also showed  some PVCs and he was referred here.   He denies again any chest pain.  Denies any palpitations, dizziness, or  syncope.   ALLERGIES:  None.   CURRENT MEDICATIONS:  1. Niaspan 500.  2. Hydrochlorothiazide 12.5 daily (just started by Dr. Perrin Maltese.)  3. Nexium 40 daily.  4. Flomax 0.4.  5. Thyroid hormone 60 mg.  6. Lutein plus B12.  7. Multivitamin.  8. Vitamin D.  9. Glyconutritional supplement.  10.Red yeast rice.  11.THEA.  12.__________.   PAST MEDICAL HISTORY:  History of Barrett esophagus.  He has been seen  in GI in the past.   SOCIAL HISTORY:  The patient is recently widowed, quit tobacco in 1968,  and does not drink.   FAMILY HISTORY:  Not remarkable for premature CAD.   REVIEW OF SYSTEMS:  All systems reviewed, negative to the above problem  except as noted does note some reflux.   PHYSICAL EXAMINATION:  GENERAL:  The patient is in no acute distress.  VITAL SIGNS:  Blood pressure is  142/78, pulse 65 and regular, and weight  160.  HEENT:  Normocephalic and atraumatic.  EOMI.  PERRL.  NECK:  JVP is normal.  No bruits.  LUNGS:  Clear.  No wheezes or rales.  CARDIAC:  Regular rate and rhythm.  S1 and S2.  No S3, S4, murmurs, or  clicks.  ABDOMEN:  Supple and nontender.  No hepatomegaly.  EXTREMITIES:  Good distal pulses throughout.  Trivial edema with sock  line showing, 2+ pulses.   A 12-lead EKG, normal sinus rhythm, 65 beats per minute, left anterior  fascicular block.  LVH.  ST-T wave changes, cannot exclude strain.  PAC  and occasional PVC noted.   IMPRESSION:  Mr. Wieman is an 77 year old gentleman who was seen for  lower extremity swelling noted to have some ectopy.  On examination  today, I did not see any evidence of significant volume overload.  I  will check a BMET given that he was just placed on hydrochlorothiazide.  We will also set him up for an echocardiogram.   If the echocardiogram shows normal left ventricular function, he is  asymptomatic, I would follow.  He is doing well physically denying any  symptoms.   I have encouraged him to stay active and I will be in touch with him  regarding the test results.     Pricilla Riffle, MD, [facility]  Electronically Signed    PVR/MedQ  DD: 02/09/2008  DT: 02/10/2008  Job #: [id number]   cc:   Jonita Albee, M.D.

## 2010-08-11 NOTE — Assessment & Plan Note (Signed)
[facility]                           GASTROENTEROLOGY OFFICE NOTE   BLADIMIR, AUMAN                      MRN:          [id number]  DATE:01/15/2006                            DOB:          1921-08-27    Sheikh is having no general medical or GI problems except for excessive  belching.  I had previously placed him on Reglan at bedtime which he  apparently is not taking at this time, but he is taking Nexium 40 mg a day.  On reviewing his chart, I cannot see why he did not stick with Reglan  previously.  He has no Barrett's mucosa.  His last endoscopy 2 years ago and  he is in need for followup exam.  He is status post cholecystectomy.   He denies any general medical, cardiac or pulmonary complaints.  He is age  77.  He is up-to-date on his colonoscopy exam which he has had on routine  screening per recurrent colon polyps.   Vital signs today are all normal with blood pressure 120/76.  Pulse was 60  and regular.  General physical exam was not performed.   RECOMMENDATIONS:  1. Outpatient endoscopy and dysplasia screening biopsies.  2. Continue Nexium 40 mg q.a.m. for the first meal of the day, along with      Reglan 10 mg at bedtime as tolerated.  3. Colonoscopy followup in 2 years' time.       Vania Rea. Jarold Motto, MD, [facility], FAGA      DRP/MedQ  DD:  01/15/2006  DT:  01/16/2006  Job #:  [id number]   cc:   Jonita Albee, M.D.

## 2010-08-11 NOTE — Discharge Summary (Signed)
[name], [name]               ACCOUNT NO.:  [id number]   MEDICAL RECORD NO.:  [id number]          PATIENT TYPE:  INP   LOCATION:  5524                         FACILITY:  [facility]   PHYSICIAN:  Levander Campion, M.D.  DATE OF BIRTH:  [birth year]   DATE OF ADMISSION:  08/01/2006  DATE OF DISCHARGE:  08/07/2006                               DISCHARGE SUMMARY   PRIMARY CARE PHYSICIAN:  Dr. Perrin Maltese at [facility].   DISCHARGE DIAGNOSES:  1. Urinary tract infection/prostatitis.  2. Pneumonia versus bronchiectasis.  3. Thyroid nodule.  4. Lung scarring and pulmonary  lymphadenopathy.  5. Atypical lymphocytes.  6. Fecal occult blood positive.  7. Acute renal failure.  8. Urinary retention.  9. Gastroesophageal reflux disease.   DISCHARGE MEDICATIONS:  1. Nexium 40 mg daily.  2. Armour Thyroid 30 mg daily.  3. Flomax 0.4 mg daily.   CONSULTS:  Urology.   PROCEDURES:  1. Postvoid residual bladder scan done after a void of 100, showed a      residual of 192 mL.  2. A renal ultrasound that showed no hydronephrosis.  3. A chest CT without contrast that showed possible asbestos related      pleural disease with extensive bilateral calcified pleurae plaques.  4. Bronchiectasis in the left lower lobe scarring throughout both      lungs.  No convincing evidence of acute cardiopulmonary disease.  5. Mediastinal and right hilar  lymphadenopathy.  Some of the right      hilar nodes are calcified.  This is nonspecific, but may also be      related to the asbestos exposure in the past.  6. A 2 cm thyroid isthmus nodule.  7. Small hiatal hernia.  8. Old compression deformities at the upper end plate of T7 and T12.   ADMISSION LABORATORIES:  Include a white blood count of 11.7, a  hemoglobin of 11.5, a hematocrit of 33.9 with an MCV of 88.5, platelet  count of 237 and an ANC of 8.8.  A CMP was significant for a sodium of  132, potassium of 3.4, chloride of 103, bicarb of 20, glucose of  134,  BUN of 67, creatinine of 3.86, an AST of 199, an ALT of 52, a total  protein of 4.8, albumin of 2.0 and calcium of 7.5.  Urinalysis with a  specific gravity of 1.021, small blood, 30 protein, negative nitrites  and large leukocytes with 11-20 white blood cells, 0-2 red blood cells  and rare bacteria.  Patient was fecal occult blood positive, 1/3.  TSH  was within normal limits at 1.291.  Free T3 was low at 1.5 and a free T4  was normal at 1.25.  Saphena was 0.8.  An initial blood smear showed  atypical lymphocytes and upon pathologist review showed platelet clumps  noted on smear with toxic granulation, present with a mild left shift  with no evidence of malignancy.  A GGT was elevated at 111.  A protein  electrophoresis was negative for an inspike and negative for Bence-Jones  proteins.   DISCHARGE LABORATORIES:  Included a sodium  of 139, a potassium of 5.2, a  chloride of 108, a bicarb of 23, a glucose of 129, a BUN of 23 and a  creatinine of 2.03 and a calcium of 8.6.  A CBC with a white blood count  of 14.1, a hemoglobin of 12.8, a hematocrit of 38.3 and a platelet count  of 336.  A urine culture was no growth, final.   HOSPITAL COURSE:  [name] is an 77 year old male who was previously  healthy, who was admitted from [facility] with a several week  history of fever and chills.  On Jul 28, 2006 he had a UA suspicious for  a UTI and he was begun on Cipro and had ceftriaxone injections x2.  A  culture, at that time, showed less than 10,000 colonies with a general  urine floor at that time.  At that time, he also had a creatinine of  1.53.  He also had a PSA that was normal at that time; however, his  fevers and chills continued and he presented to Dr. Annabell Howells, urologist, on  Aug 01, 2006.  He had an ultrasound of his bladder and kidneys that were  normal, but on a CMET, he was found to have a creatinine of 3.8, a BUN  of 75 and elevated alkaline phosphatase at 218.  He  was sent back to  [facility] from the urologist's office, given a 1500 mL bolus of  fluids where a urinalysis was done that showed moderate LE, trace  bacteria, 20-25 white blood cells and a specific gravity of 1.03, trace  blood and 0-2 red blood cells, small bili and protein of 100.  Chest x-  ray was done at this time, which showed bilateral pulmonary infiltrates.  Also, the patient had hypotension at 89/52 prior to his IV fluids.  Blood pressure improved to 108/62.  He was asymptomatic from the lung  infiltrates.  He denied any cough, chest pain, shortness of breath or  congestion.  He did have a complaint of dribbling urine for about 1  week.   1. Fever and chills.  The patient was admitted and begun on IV Zosyn      for treatment of both presumed UTI that failed outpatient treatment      with Cipro and possible pneumonia.  However, he did not have any      symptoms of pneumonia and his urinalysis did not grow any      organisms.  He had no documented fevers while in the hospital,      although his white count remained consistently elevated in the 12.0-      14.0 range.  A smear did show atypical lymphocytes that showed      toxic granulation.  It is possible that his fever and chills and      dehydration were secondary to viral illness.  This would also      account for atypical lymphocytes on smear.  The patient had no      respiratory distress or pulmonary symptoms or urinary symptoms      while in the hospital and finished a full course of treatment for      presumed UTIs and community acquired pneumonia.  Also, chest CT      showed mild bronchiectasis, which could be responsible for fever      and chills, although unlikely because the patient did not have any      sputum change or any sputum  at all or any pulmonary symptoms and      was afebrile throughout the hospital stay.  Additionally, the     patient could have had a prostatitis; however, he was afebrile       during the hospital course, was not tender on rectal exam and      finished a sufficient course of antibiotics.  2. Abnormal chest CT.  We did obtain a chest CT after seeing the      atypical appearance of his infiltrates on chest x-ray.  This did      show extensive scarring in both lung lobes and extensive plaques of      the pleura, consistent with possible asbestos exposure in the past.      Also showed a mediastinal and perihilar  lymphadenopathy, some      calcified that could also be related to asbestos exposure.  The      patient stated that he has had a biopsy of his scarring of his      lungs about 20 years ago that also showed scarring.  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  3. Thyroid nodule.  A thyroid nodule was incidentally found on chest      CT and measured 2 cm in the isthmus.  The patient had normal      thyroid studies during this hospitalization and was asymptomatic.      This will need to be followed up as an outpatient.  4. Acute renal failure.  The patient presented with a creatinine of      3.86 with saphena of 0.8, indicating a prerenal etiology, which is      supported by the fact that the patient was hypotensive on admission      with significant dehydration.  The patient was started on      aggressive IV fluid hydration and over the course of the hospital      admission, his creatinine dropped from 3.8 to 2.0 and was trending      down at the time of discharge.  This will need to be followed up as      an outpatient.  5. Urinary retention.  The patient was complaining of some urinary      dribbling and difficulty emptying his bladder completely.  Postvoid      residual  bladder scan showed a retention of 192 mL of urine after a      void of 100.  The renal ultrasound was negative for hydronephrosis.      The patient was started on Flomax 0.4 mg daily for likely BPH and      urology was consulted and they will follow up with the patient in      their office.  6. GERD.  The patient was continued on Protonix in the hospital      without problems.   FOLLOWUP:  The patient is to follow up with Dr. Perrin Maltese on May 15 from  anytime between 8 and 3 p.m.  Patient is also to follow up with Dr.  Annabell Howells, the  urologist, on May 22 at 11 a.m.           ______________________________  Levander Campion, M.D.     JH/MEDQ  D:  08/13/2006  T:  08/13/2006  Job:  [id number]   cc:   Jonita Albee, M.D.  Excell Seltzer. Annabell Howells, M.D.

## 2010-08-11 NOTE — Assessment & Plan Note (Signed)
[facility]                         GASTROENTEROLOGY OFFICE NOTE   [name], [name]                      MRN:          [id number]  DATE:03/25/2006                            DOB:          09-03-1921    [name] continues to complain of belching and burping unresponsive to a 2-  week trial of Reglan 10 mg at bedtime.  He denies dysphagia or other  gastrointestinal problems.   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  He currently is taking  Nexium 40 mg a day and denies reflux symptoms.  Additional medications  are aspirin 325 mg a half a tablet a day, multivitamins and garlic  tablets.   His weight today is 176 pounds and blood pressure is 130/78.  Pulse was  82 and regular.  General physical exam was not repeated at this time.   ASSESSMENT:  [name] has a hiatal hernia with acid reflux and  Barrett's mucosa.  His belching and burping seem refractory to medical  therapy, is really not a major problem for him at this time.   RECOMMENDATIONS:  1. Yearly endoscopy with biopsies for his dysplasia.  2. Nexium 40 mg a day and twice a day as needed.  3. Information concerning abdominal gas and belching which he is to      review and make appropriate changes.  4. Continue other medications as per his primary care physician, Dr.      Robert Bellow.   ADDENDUM:  The patient is status post cholecystectomy and his  colonoscopy is due in October 2009.     Vania Rea. Jarold Motto, MD, Caleen Essex, FAGA  Electronically Signed    DRP/MedQ  DD: 03/25/2006  DT: 03/25/2006  Job #: [id number]   cc:   Jonita Albee, M.D.

## 2010-09-18 ENCOUNTER — Encounter: Payer: Self-pay | Admitting: Internal Medicine

## 2010-09-18 DIAGNOSIS — K449 Diaphragmatic hernia without obstruction or gangrene: Secondary | ICD-10-CM

## 2010-10-23 ENCOUNTER — Telehealth: Payer: Self-pay | Admitting: Internal Medicine

## 2010-10-23 DIAGNOSIS — E782 Mixed hyperlipidemia: Secondary | ICD-10-CM

## 2010-10-23 NOTE — Telephone Encounter (Signed)
Pt wants to get lab work done prior to appt

## 2010-10-23 NOTE — Telephone Encounter (Signed)
Called patient back. He wanted to schedule his follow up appointment with Dr.Ross and set up fasting labs.

## 2010-11-21 ENCOUNTER — Other Ambulatory Visit (INDEPENDENT_AMBULATORY_CARE_PROVIDER_SITE_OTHER): Payer: Medicare Other | Admitting: *Deleted

## 2010-11-21 DIAGNOSIS — I428 Other cardiomyopathies: Secondary | ICD-10-CM

## 2010-11-21 DIAGNOSIS — E782 Mixed hyperlipidemia: Secondary | ICD-10-CM

## 2010-11-21 LAB — BASIC METABOLIC PANEL
BUN: 34 mg/dL — ABNORMAL HIGH (ref 6–23)
Calcium: 9.2 mg/dL (ref 8.4–10.5)
GFR: 58.06 mL/min — ABNORMAL LOW (ref >60.00)
Glucose, Bld: 101 mg/dL — ABNORMAL HIGH (ref 70–99)
Sodium: 141 mEq/L (ref 135–145)

## 2010-11-21 LAB — LIPID PANEL
Cholesterol: 173 mg/dL (ref 0–200)
LDL Cholesterol: 112 mg/dL — ABNORMAL HIGH (ref 0–99)
Triglycerides: 140 mg/dL (ref 0.0–149.0)
VLDL: 28 mg/dL (ref 0.0–40.0)

## 2010-11-23 ENCOUNTER — Ambulatory Visit (INDEPENDENT_AMBULATORY_CARE_PROVIDER_SITE_OTHER): Payer: Medicare Other | Admitting: Internal Medicine

## 2010-11-23 ENCOUNTER — Encounter: Payer: Self-pay | Admitting: Internal Medicine

## 2010-11-23 DIAGNOSIS — I10 Essential (primary) hypertension: Secondary | ICD-10-CM

## 2010-11-23 DIAGNOSIS — E785 Hyperlipidemia, unspecified: Secondary | ICD-10-CM

## 2010-11-23 DIAGNOSIS — I509 Heart failure, unspecified: Secondary | ICD-10-CM

## 2010-11-23 DIAGNOSIS — I2510 Atherosclerotic heart disease of native coronary artery without angina pectoris: Secondary | ICD-10-CM

## 2010-11-23 LAB — CBC WITH DIFFERENTIAL/PLATELET
Basophils Absolute: 0 10*3/uL (ref 0.0–0.1)
Lymphocytes Relative: 24.4 % (ref 12.0–46.0)
Monocytes Relative: 13.6 % — ABNORMAL HIGH (ref 3.0–12.0)
Neutrophils Relative %: 59 % (ref 43.0–77.0)
Platelets: 187 10*3/uL (ref 150.0–400.0)
RDW: 13.3 % (ref 11.5–14.6)

## 2010-11-23 LAB — TSH: TSH: 0.97 u[IU]/mL (ref 0.35–5.50)

## 2010-11-23 LAB — BASIC METABOLIC PANEL
Chloride: 106 mEq/L (ref 96–112)
Creatinine, Ser: 1.4 mg/dL (ref 0.4–1.5)
Potassium: 4.1 mEq/L (ref 3.5–5.1)

## 2010-11-23 MED ORDER — METOPROLOL SUCCINATE ER 25 MG PO TB24: 25.0000 mg | ORAL_TABLET | Freq: Every day | ORAL | Status: DC

## 2010-11-23 MED ORDER — LISINOPRIL-HYDROCHLOROTHIAZIDE 10-12.5 MG PO TABS: 1.0000 | ORAL_TABLET | Freq: Every day | ORAL | Status: DC

## 2010-11-23 NOTE — Patient Instructions (Signed)
Lab work today We will call you with results.  Your physician wants you to follow-up in: 6 months You will receive a reminder letter in the mail two months in advance. If you don't receive a letter, please call our office to schedule the follow-up appointment.  

## 2010-11-23 NOTE — Progress Notes (Signed)
HPI Patient is an 77 year old with a history of presumed NICM (norml perfusion on myoview), hypertension and dyslipidemia. I saw him last in February. Since I saw him he has done OK. He denies signif SOB>  No edema.  No chest pains.  He is not swimmiing like he did but is walking. He does say he is cold a lot.   Allergies  Allergen Reactions  . Ciprofloxacin     REACTION: rash    Current Outpatient Prescriptions  Medication Sig Dispense Refill  . esomeprazole (NEXIUM) 40 MG capsule Take 40 mg by mouth daily before breakfast.        . lisinopril-hydrochlorothiazide (PRINZIDE,ZESTORETIC) 10-12.5 MG per tablet Take 1 tablet by mouth daily.        . metoprolol succinate (TOPROL-XL) 25 MG 24 hr tablet Take 25 mg by mouth daily.        . pantoprazole (PROTONIX) 40 MG tablet Take 1 tablet (40 mg total) by mouth daily.  90 tablet  3  . thyroid (ARMOUR) 60 MG tablet Take 60 mg by mouth daily.        . Bilberry 150 MG CAPS Take by mouth daily.        . furosemide (LASIX) 20 MG tablet Take 20 mg by mouth every other day.        . Multiple Vitamin (MULTIVITAMIN) tablet Take 1 tablet by mouth daily.        . Omega-3 Fatty Acids (FISH OIL) 1000 MG CAPS Take by mouth daily.        . simvastatin (ZOCOR) 20 MG tablet Take 20 mg by mouth daily.        . Tamsulosin HCl (FLOMAX) 0.4 MG CAPS Take by mouth daily.        Marland Kitchen testosterone cypionate (DEPO-TESTOSTERONE) 200 MG/ML injection Inject into the muscle once a week.          Past Medical History  Diagnosis Date  . BARRETTS ESOPHAGUS 11/19/2007  . CARDIOMYOPATHY 08/02/2008  . CHANGE IN BOWELS 02/01/2009  . CHF 08/10/2008  . Diarrhea 01/31/2009  . DIVERTICULOSIS, COLON 11/19/2007  . GASTRITIS 11/19/2007  . GERD 11/19/2007  . HIATAL HERNIA 11/19/2007  . HYPERLIPIDEMIA 06/27/2009  . HYPOTHYROIDISM 06/27/2009  . MELENA 08/01/2009  . PERSONAL HX COLONIC POLYPS 02/01/2009  . TINEA CORPORIS 06/27/2009  . TOBACCO USE, QUIT 06/27/2009  . TREMOR, ESSENTIAL 06/27/2009     Past Surgical History  Procedure Date  . Cholecystectomy   . Lung surgery 1984  . Hernia repair   . Tonsillectomy     Family History  Problem Relation Age of Onset  . Heart disease Mother   . Colon cancer Father   . Prostate cancer Brother     History   Social History  . Marital Status: Widowed    Spouse Name: N/A    Number of Children: N/A  . Years of Education: N/A   Occupational History  . Not on file.   Social History Main Topics  . Smoking status: Former Smoker    Types: Cigarettes    Quit date: 03/26/1962  . Smokeless tobacco: Not on file   Comment: Widowed x 2, has lady friend  . Alcohol Use: Yes     occassional  . Drug Use: No  . Sexually Active:    Other Topics Concern  . Not on file   Social History Narrative  . No narrative on file    Review of Systems:  All systems reviewed.  They are negative to the above problem except as previously stated.  Vital Signs: BP 124/62  Pulse 58  Ht 5\' 7"  (1.702 m)  Wt 161 lb (73.029 kg)  BMI 25.22 kg/m2  Physical Exam  HEENT:  Normocephalic, atraumatic. EOMI, PERRLA.  Neck: JVP is normal. No thyromegaly. No bruits.  Lungs: clear to auscultation. No rales no wheezes.  Heart: Regular rate and rhythm. Normal S1, S2. No S3.   No significant murmurs. PMI not displaced.  Abdomen:  Supple, nontender. Normal bowel sounds. No masses. No hepatomegaly.  Extremities:   Good distal pulses throughout. No lower extremity edema.  Musculoskeletal :moving all extremities.  Neuro:   alert and oriented x3.  CN II-XII grossly intact.  EKG:  SInus bradycardia.  58 bpm.  Assessment and Plan:

## 2010-11-28 ENCOUNTER — Telehealth: Payer: Self-pay | Admitting: Internal Medicine

## 2010-11-28 DIAGNOSIS — E039 Hypothyroidism, unspecified: Secondary | ICD-10-CM

## 2010-11-28 DIAGNOSIS — E782 Mixed hyperlipidemia: Secondary | ICD-10-CM

## 2010-11-28 MED ORDER — THYROID 60 MG PO TABS: 60.0000 mg | ORAL_TABLET | Freq: Every day | ORAL | Status: DC

## 2010-11-28 MED ORDER — SIMVASTATIN 20 MG PO TABS: 20.0000 mg | ORAL_TABLET | Freq: Every day | ORAL | Status: DC

## 2010-11-28 NOTE — Telephone Encounter (Signed)
Called patient back. He wanted to know about his lab results from last week. I gave him the results. He wanted [place] to send in refills to [facility] for Simvastatin and Thyroid medication. Will send in for a 90 day supply.

## 2010-11-28 NOTE — Telephone Encounter (Signed)
Pt has a question about medication he was told to pick up from [facility] last Thursday at office visit. He would like results from his labwork.

## 2010-12-04 ENCOUNTER — Encounter (INDEPENDENT_AMBULATORY_CARE_PROVIDER_SITE_OTHER): Payer: Medicare Other | Admitting: Ophthalmology

## 2010-12-04 DIAGNOSIS — H43819 Vitreous degeneration, unspecified eye: Secondary | ICD-10-CM

## 2010-12-04 DIAGNOSIS — H3530 Unspecified macular degeneration: Secondary | ICD-10-CM

## 2010-12-04 NOTE — Assessment & Plan Note (Signed)
Adequate control. 

## 2010-12-04 NOTE — Assessment & Plan Note (Signed)
Admits to taking statin erratically.  Encouraged compliance.  Goal to prevent future events.

## 2010-12-04 NOTE — Assessment & Plan Note (Signed)
Volume status looks good.  I would recomm continuing current regimen.  Recomm he continue to stay activie.

## 2010-12-06 ENCOUNTER — Encounter (INDEPENDENT_AMBULATORY_CARE_PROVIDER_SITE_OTHER): Payer: Medicare Other | Admitting: Ophthalmology

## 2010-12-06 DIAGNOSIS — H3530 Unspecified macular degeneration: Secondary | ICD-10-CM

## 2010-12-11 ENCOUNTER — Encounter (INDEPENDENT_AMBULATORY_CARE_PROVIDER_SITE_OTHER): Payer: Medicare Other | Admitting: Ophthalmology

## 2010-12-11 DIAGNOSIS — H353290 Exudative age-related macular degeneration, unspecified eye, stage unspecified: Secondary | ICD-10-CM

## 2010-12-11 DIAGNOSIS — H3530 Unspecified macular degeneration: Secondary | ICD-10-CM

## 2010-12-11 DIAGNOSIS — H43819 Vitreous degeneration, unspecified eye: Secondary | ICD-10-CM

## 2010-12-12 ENCOUNTER — Telehealth: Payer: Self-pay | Admitting: *Deleted

## 2010-12-12 NOTE — Telephone Encounter (Signed)
Pt called to ask when his next COLON and EGD are due; pt thinks it may be this year.  Informed pt his last EGD was in November 2010 and the report stated his recall should be in Nov., 2013. His COLON was in November 2010, but a recall date was not mentioned. Pt was referred to [facility] but stated he didn't think they were helping him, so he doesn't go there anymore. He stated he is doing better on fiber; the med they gave him at [facility] didn't help him. Informed pt I will check to see if his procedures will be paid for and let him know; pt stated understanding.

## 2010-12-19 NOTE — Telephone Encounter (Signed)
lmom for pt to call back. I spoke with Morrie Sheldon in pre cert who stated a COLON or ECL will not be covered if pt isn't having any symptoms.

## 2010-12-19 NOTE — Telephone Encounter (Signed)
Notified pt that his procedure will not be covered for this year unless he's having problems; pt will call for further problems and stated understanding.

## 2011-01-03 ENCOUNTER — Encounter (INDEPENDENT_AMBULATORY_CARE_PROVIDER_SITE_OTHER): Payer: Medicare Other | Admitting: Ophthalmology

## 2011-01-03 DIAGNOSIS — H353290 Exudative age-related macular degeneration, unspecified eye, stage unspecified: Secondary | ICD-10-CM

## 2011-01-03 DIAGNOSIS — H43819 Vitreous degeneration, unspecified eye: Secondary | ICD-10-CM

## 2011-01-03 DIAGNOSIS — H3530 Unspecified macular degeneration: Secondary | ICD-10-CM

## 2011-01-31 ENCOUNTER — Encounter (INDEPENDENT_AMBULATORY_CARE_PROVIDER_SITE_OTHER): Payer: Medicare Other | Admitting: Ophthalmology

## 2011-01-31 DIAGNOSIS — H43819 Vitreous degeneration, unspecified eye: Secondary | ICD-10-CM

## 2011-01-31 DIAGNOSIS — H353290 Exudative age-related macular degeneration, unspecified eye, stage unspecified: Secondary | ICD-10-CM

## 2011-01-31 DIAGNOSIS — H3530 Unspecified macular degeneration: Secondary | ICD-10-CM

## 2011-02-21 ENCOUNTER — Encounter (INDEPENDENT_AMBULATORY_CARE_PROVIDER_SITE_OTHER): Payer: Medicare Other | Admitting: Ophthalmology

## 2011-03-04 ENCOUNTER — Ambulatory Visit (INDEPENDENT_AMBULATORY_CARE_PROVIDER_SITE_OTHER): Payer: Medicare Other

## 2011-03-04 DIAGNOSIS — K589 Irritable bowel syndrome without diarrhea: Secondary | ICD-10-CM

## 2011-03-04 DIAGNOSIS — B9789 Other viral agents as the cause of diseases classified elsewhere: Secondary | ICD-10-CM

## 2011-03-07 ENCOUNTER — Encounter (INDEPENDENT_AMBULATORY_CARE_PROVIDER_SITE_OTHER): Payer: Medicare Other | Admitting: Ophthalmology

## 2011-03-07 DIAGNOSIS — H3530 Unspecified macular degeneration: Secondary | ICD-10-CM

## 2011-03-07 DIAGNOSIS — H353290 Exudative age-related macular degeneration, unspecified eye, stage unspecified: Secondary | ICD-10-CM

## 2011-03-07 DIAGNOSIS — H43819 Vitreous degeneration, unspecified eye: Secondary | ICD-10-CM

## 2011-04-18 ENCOUNTER — Encounter (INDEPENDENT_AMBULATORY_CARE_PROVIDER_SITE_OTHER): Payer: Medicare Other | Admitting: Ophthalmology

## 2011-04-18 DIAGNOSIS — H26499 Other secondary cataract, unspecified eye: Secondary | ICD-10-CM

## 2011-04-18 DIAGNOSIS — H353290 Exudative age-related macular degeneration, unspecified eye, stage unspecified: Secondary | ICD-10-CM

## 2011-04-18 DIAGNOSIS — H3530 Unspecified macular degeneration: Secondary | ICD-10-CM

## 2011-04-18 DIAGNOSIS — H43819 Vitreous degeneration, unspecified eye: Secondary | ICD-10-CM

## 2011-05-23 ENCOUNTER — Encounter (INDEPENDENT_AMBULATORY_CARE_PROVIDER_SITE_OTHER): Payer: Medicare Other | Admitting: Ophthalmology

## 2011-05-23 DIAGNOSIS — H3530 Unspecified macular degeneration: Secondary | ICD-10-CM

## 2011-05-23 DIAGNOSIS — H43819 Vitreous degeneration, unspecified eye: Secondary | ICD-10-CM

## 2011-05-23 DIAGNOSIS — H10219 Acute toxic conjunctivitis, unspecified eye: Secondary | ICD-10-CM

## 2011-05-23 DIAGNOSIS — H353290 Exudative age-related macular degeneration, unspecified eye, stage unspecified: Secondary | ICD-10-CM

## 2011-05-23 DIAGNOSIS — H26499 Other secondary cataract, unspecified eye: Secondary | ICD-10-CM

## 2011-07-02 ENCOUNTER — Encounter: Payer: Self-pay | Admitting: Internal Medicine

## 2011-07-02 ENCOUNTER — Ambulatory Visit (INDEPENDENT_AMBULATORY_CARE_PROVIDER_SITE_OTHER): Payer: Medicare Other | Admitting: Internal Medicine

## 2011-07-02 VITALS — BP 140/79 | HR 61 | Temp 97.8°F | Resp 16 | Ht 66.0 in | Wt 163.0 lb

## 2011-07-02 DIAGNOSIS — Z0000 Encounter for general adult medical examination without abnormal findings: Secondary | ICD-10-CM

## 2011-07-02 DIAGNOSIS — E785 Hyperlipidemia, unspecified: Secondary | ICD-10-CM

## 2011-07-02 DIAGNOSIS — E7889 Other lipoprotein metabolism disorders: Secondary | ICD-10-CM

## 2011-07-02 DIAGNOSIS — I10 Essential (primary) hypertension: Secondary | ICD-10-CM

## 2011-07-02 DIAGNOSIS — Z7189 Other specified counseling: Secondary | ICD-10-CM

## 2011-07-02 DIAGNOSIS — Z79899 Other long term (current) drug therapy: Secondary | ICD-10-CM

## 2011-07-02 DIAGNOSIS — I429 Cardiomyopathy, unspecified: Secondary | ICD-10-CM

## 2011-07-02 LAB — CBC WITH DIFFERENTIAL/PLATELET
Lymphs Abs: 1.4 10*3/uL (ref 0.7–4.0)
MCH: 29.8 pg (ref 26.0–34.0)
Neutrophils Relative %: 66 % (ref 43–77)
Platelets: 229 10*3/uL (ref 150–400)
RBC: 5.03 MIL/uL (ref 4.22–5.81)
RDW: 13.9 % (ref 11.5–15.5)

## 2011-07-02 LAB — COMPREHENSIVE METABOLIC PANEL
ALT: 14 U/L (ref 0–53)
Albumin: 4.1 g/dL (ref 3.5–5.2)
Alkaline Phosphatase: 116 U/L (ref 39–117)
CO2: 28 mEq/L (ref 19–32)
Glucose, Bld: 108 mg/dL — ABNORMAL HIGH (ref 70–99)
Potassium: 4 mEq/L (ref 3.5–5.3)
Sodium: 140 mEq/L (ref 135–145)
Total Protein: 6.9 g/dL (ref 6.0–8.3)

## 2011-07-02 LAB — POCT URINALYSIS DIPSTICK
Bilirubin, UA: NEGATIVE
Blood, UA: NEGATIVE
Glucose, UA: NEGATIVE
Ketones, UA: NEGATIVE
Spec Grav, UA: 1.025

## 2011-07-02 LAB — POCT UA - MICROSCOPIC ONLY: RBC, urine, microscopic: NEGATIVE

## 2011-07-02 MED ORDER — FUROSEMIDE 20 MG PO TABS: 20.0000 mg | ORAL_TABLET | ORAL | Status: DC

## 2011-07-02 NOTE — Progress Notes (Signed)
  Subjective:    Patient ID: [name], male    DOB: 21-Jun-1921, 78 y.o.   MRN: [id number]  HPI CPE, has new macular degeneration working with Dr. Jerolyn Center. See scanned hx, walks everyday and works out daily. HTN , lipid disorder, all controlled.  Review of Systems See scanned ROS    Objective:   Physical Exam  Constitutional: He is oriented to person, place, and time. He appears well-developed and well-nourished. No distress.  HENT:  Head: Normocephalic.  Mouth/Throat: Oropharynx is clear and moist.  Eyes: EOM are normal. Pupils are equal, round, and reactive to light. Left eye exhibits no discharge.  Neck: Normal range of motion. Neck supple. No thyromegaly present.  Cardiovascular: Normal rate, regular rhythm, normal heart sounds and intact distal pulses.   No murmur heard. Pulmonary/Chest: Effort normal and breath sounds normal.  Abdominal: Soft. Bowel sounds are normal.  Genitourinary: Penis normal.  Musculoskeletal: Normal range of motion. He exhibits no edema.  Lymphadenopathy:    He has no cervical adenopathy.  Neurological: He is alert and oriented to person, place, and time.  Skin: Skin is warm and dry.  Psychiatric: He has a normal mood and affect.   Results for orders placed in visit on 07/02/11  POCT UA - MICROSCOPIC ONLY      Component Value Range   WBC, Ur, HPF, POC 0-1     RBC, urine, microscopic neg     Bacteria, U Microscopic neg     Mucus, UA trace     Epithelial cells, urine per micros 0-1     Crystals, Ur, HPF, POC neg     Casts, Ur, LPF, POC neg     Yeast, UA neg    POCT URINALYSIS DIPSTICK      Component Value Range   Color, UA yellow     Clarity, UA clear     Glucose, UA neg     Bilirubin, UA neg     Ketones, UA neg     Spec Grav, UA 1.025     Blood, UA neg     pH, UA 5.5     Protein, UA neg     Urobilinogen, UA 0.2     Nitrite, UA neg     Leukocytes, UA Negative            Assessment & Plan:  Healthy Refill meds 1 year

## 2011-07-03 ENCOUNTER — Other Ambulatory Visit (INDEPENDENT_AMBULATORY_CARE_PROVIDER_SITE_OTHER): Payer: Medicare Other | Admitting: Ophthalmology

## 2011-07-03 DIAGNOSIS — H3530 Unspecified macular degeneration: Secondary | ICD-10-CM

## 2011-07-03 DIAGNOSIS — H353290 Exudative age-related macular degeneration, unspecified eye, stage unspecified: Secondary | ICD-10-CM

## 2011-07-03 DIAGNOSIS — H43819 Vitreous degeneration, unspecified eye: Secondary | ICD-10-CM

## 2011-07-03 DIAGNOSIS — H26499 Other secondary cataract, unspecified eye: Secondary | ICD-10-CM

## 2011-07-04 ENCOUNTER — Encounter: Payer: Self-pay | Admitting: *Deleted

## 2011-07-05 ENCOUNTER — Encounter (INDEPENDENT_AMBULATORY_CARE_PROVIDER_SITE_OTHER): Payer: Medicare Other | Admitting: Ophthalmology

## 2011-07-05 DIAGNOSIS — H3530 Unspecified macular degeneration: Secondary | ICD-10-CM

## 2011-07-05 DIAGNOSIS — H353290 Exudative age-related macular degeneration, unspecified eye, stage unspecified: Secondary | ICD-10-CM

## 2011-07-06 ENCOUNTER — Telehealth: Payer: Self-pay | Admitting: Internal Medicine

## 2011-07-06 ENCOUNTER — Other Ambulatory Visit: Payer: Self-pay | Admitting: Internal Medicine

## 2011-07-06 LAB — POC HEMOCCULT BLD/STL (HOME/3-CARD/SCREEN): Fecal Occult Blood, POC: NEGATIVE

## 2011-07-06 NOTE — Telephone Encounter (Signed)
Patient states that his insurance does not cover the Armor Thyroid medication anymore. Patient was made aware that he needs to call his PCP for this medication, pt verbalized understanding. A  6 months F/U visit was made for pt on 09/17/11 at 3:15 PM with Dr. Tenny Craw. Patient aware.

## 2011-07-06 NOTE — Progress Notes (Signed)
Addended by: Johnnette Litter on: 07/06/2011 04:07 PM   Modules accepted: Orders

## 2011-07-06 NOTE — Telephone Encounter (Signed)
  Patient request return call regarding thyroid medication, he can be reached at [phone].  Patient would like to discuss medication change.

## 2011-07-06 NOTE — Telephone Encounter (Signed)
Pt calling back to make sure pt gets a call back today

## 2011-07-25 ENCOUNTER — Ambulatory Visit (INDEPENDENT_AMBULATORY_CARE_PROVIDER_SITE_OTHER): Payer: Medicare Other | Admitting: Family Medicine

## 2011-07-25 ENCOUNTER — Ambulatory Visit: Payer: Medicare Other

## 2011-07-25 ENCOUNTER — Encounter: Payer: Self-pay | Admitting: Family Medicine

## 2011-07-25 VITALS — BP 144/69 | HR 62 | Temp 98.1°F | Resp 16

## 2011-07-25 DIAGNOSIS — M79609 Pain in unspecified limb: Secondary | ICD-10-CM

## 2011-07-25 DIAGNOSIS — S9781XA Crushing injury of right foot, initial encounter: Secondary | ICD-10-CM

## 2011-07-25 NOTE — Progress Notes (Signed)
This is an 78 year old gentleman who comes in with right foot pain, having [place] [employer] [place] right dorsal foot this morning at 9 AM. He's had some progressive swelling in that area although he is able to move his toes normally.  Objective: Mildly antalgic gait which is stable  Inspection of right foot: No ecchymosis, minimal swelling, diffuse onychomycosis  Palpation: Tenderness over dorsal mid foot, good pules pulses are   range of motion: Excellent ankle and toe range of motion  [facility] reading (PRIMARY) by  Dr. Milus Glazier:  Right foot.

## 2011-07-25 NOTE — Patient Instructions (Signed)
Crush Injury, Fingers or Toes A crush injury means the fingers or toes are hurt by being squeezed (compressed). HOME CARE  Raise (elevate) the injured part above the level of your heart. Do this as much as you can for the first few days.   Put ice on the injured area.   Put ice in a plastic bag.   Place a towel between your skin and the bag.   Leave the ice on for 15 to 20 minutes, 3 to 4 times a day for the first 2 days.   Only take medicine as told by your doctor.   Use the injured part only as told by your doctor.   Change bandages (dressings) as told by your doctor.   Keep all doctor visits as told.  GET HELP RIGHT AWAY IF:   There is redness, puffiness (swelling), or more pain in the injured finger or toe.   Yellowish-white fluid (pus) comes from the wound.   You have a fever.   A bad smell comes from the wound or bandage.   The wound breaks open.   You cannot move the injured finger or toe.  MAKE SURE YOU:   Understand these instructions.   Will watch your condition.   Will get help right away if you are not doing well or get worse.  Document Released: 08/30/2009 Document Revised: 03/01/2011 Document Reviewed: 07/28/2010 Exit[employer] Patient Information 2012 [employer]. 

## 2011-08-16 ENCOUNTER — Encounter (INDEPENDENT_AMBULATORY_CARE_PROVIDER_SITE_OTHER): Payer: Medicare Other | Admitting: Ophthalmology

## 2011-08-16 DIAGNOSIS — H353290 Exudative age-related macular degeneration, unspecified eye, stage unspecified: Secondary | ICD-10-CM

## 2011-08-16 DIAGNOSIS — I10 Essential (primary) hypertension: Secondary | ICD-10-CM

## 2011-08-16 DIAGNOSIS — H3530 Unspecified macular degeneration: Secondary | ICD-10-CM

## 2011-08-16 DIAGNOSIS — H43819 Vitreous degeneration, unspecified eye: Secondary | ICD-10-CM

## 2011-09-13 ENCOUNTER — Other Ambulatory Visit: Payer: Self-pay

## 2011-09-13 DIAGNOSIS — I2510 Atherosclerotic heart disease of native coronary artery without angina pectoris: Secondary | ICD-10-CM

## 2011-09-13 MED ORDER — METOPROLOL SUCCINATE ER 25 MG PO TB24: 25.0000 mg | ORAL_TABLET | Freq: Every day | ORAL | Status: DC

## 2011-09-17 ENCOUNTER — Encounter: Payer: Self-pay | Admitting: Internal Medicine

## 2011-09-17 ENCOUNTER — Ambulatory Visit (INDEPENDENT_AMBULATORY_CARE_PROVIDER_SITE_OTHER): Payer: Medicare Other | Admitting: Internal Medicine

## 2011-09-17 VITALS — BP 126/76 | HR 69 | Ht 66.0 in | Wt 162.0 lb

## 2011-09-17 DIAGNOSIS — E782 Mixed hyperlipidemia: Secondary | ICD-10-CM

## 2011-09-17 NOTE — Patient Instructions (Signed)
Lab work today We will call you with results. 

## 2011-09-17 NOTE — Progress Notes (Signed)
HPIPatient is an 78 year old with a history of presumed NICM (norml perfusion on myoview), hypertension and dyslipidemia I saw him in clinic last summer. Since seen he has done well from a cardiac standpoint.  Breathing is OK.  He still works out at [employer] several times per week with some wt lifting and he walks a mile each time. He has had 3 episodes of light CP.  Not associated to a particular activity.  Last one was 1 month ago. No edema.   Allergies  Allergen Reactions  . Ciprofloxacin     REACTION: rash    Current Outpatient Prescriptions  Medication Sig Dispense Refill  . lisinopril-hydrochlorothiazide (PRINZIDE,ZESTORETIC) 10-12.5 MG per tablet Take 1 tablet by mouth daily.  90 tablet  3  . metoprolol succinate (TOPROL-XL) 25 MG 24 hr tablet Take 1 tablet (25 mg total) by mouth daily.  90 tablet  3  . Multiple Vitamins-Minerals (PRESERVISION AREDS 2 PO) Take by mouth. 1 TAB TWICE A DAY      . Omega-3 Fatty Acids (FISH OIL) 1000 MG CAPS Take by mouth daily.        . Probiotic Product (ALIGN PO) Take by mouth. 1 TAB DAILY      . simvastatin (ZOCOR) 20 MG tablet Take 1 tablet (20 mg total) by mouth daily.  90 tablet  3  . Tamsulosin HCl (FLOMAX) 0.4 MG CAPS Take by mouth daily.        . pantoprazole (PROTONIX) 40 MG tablet Take 1 tablet (40 mg total) by mouth daily.  90 tablet  3    Past Medical History  Diagnosis Date  . BARRETTS ESOPHAGUS 11/19/2007  . CARDIOMYOPATHY 08/02/2008  . CHANGE IN BOWELS 02/01/2009  . CHF 08/10/2008  . Diarrhea 01/31/2009  . DIVERTICULOSIS, COLON 11/19/2007  . GASTRITIS 11/19/2007  . GERD 11/19/2007  . HIATAL HERNIA 11/19/2007  . HYPERLIPIDEMIA 06/27/2009  . HYPOTHYROIDISM 06/27/2009  . MELENA 08/01/2009  . PERSONAL HX COLONIC POLYPS 02/01/2009  . TINEA CORPORIS 06/27/2009  . TOBACCO USE, QUIT 06/27/2009  . TREMOR, ESSENTIAL 06/27/2009    Past Surgical History  Procedure Date  . Cholecystectomy   . Lung surgery 1984  . Hernia repair   . Tonsillectomy      Family History  Problem Relation Age of Onset  . Heart disease Mother   . Colon cancer Father   . Prostate cancer Brother     History   Social History  . Marital Status: Widowed    Spouse Name: N/A    Number of Children: N/A  . Years of Education: N/A   Occupational History  . Not on file.   Social History Main Topics  . Smoking status: Former Smoker    Types: Cigarettes    Quit date: 03/26/1962  . Smokeless tobacco: Not on file   Comment: Widowed x 2, has lady friend  . Alcohol Use: No     occassional  . Drug Use: No  . Sexually Active: Not on file   Other Topics Concern  . Not on file   Social History Narrative  . No narrative on file    Review of Systems:  All systems reviewed.  They are negative to the above problem except as previously stated.  Vital Signs: BP 126/76  Pulse 69  Ht 5\' 6"  (1.676 m)  Wt 162 lb (73.483 kg)  BMI 26.15 kg/m2  Physical Exam Patient is in NAD HEENT:  Normocephalic, atraumatic. EOMI, PERRLA.  Neck: JVP is  normal. No thyromegaly. No bruits.  Lungs: clear to auscultation. No rales no wheezes.  Heart: Regular rate and rhythm. Normal S1, S2. No S3.   No significant murmurs. PMI not displaced.  Abdomen:  Supple, nontender. Normal bowel sounds. No masses. No hepatomegaly.  Extremities:   Good distal pulses throughout. No lower extremity edema.  Musculoskeletal :moving all extremities.  Neuro:   alert and oriented x3.  CN II-XII grossly intact.  EKG:  SB.  59 bpm.  Low voltage.  Assessment and Plan: 1.  NICM.  Normal myoview in past.Volume status looks good.  Does not appear to be limited by symptoms 2.  CP  I am not convinced cardiac.  Atypical.  Follow.  Call if recurs 3.  HTN.  Good control   4.  Dyslipidemia  Will check lipids today.  He ate 2 apples in early AM

## 2011-09-18 LAB — LIPID PANEL
Cholesterol: 135 mg/dL (ref 0–200)
HDL: 32 mg/dL — ABNORMAL LOW (ref >39.00)
Triglycerides: 155 mg/dL — ABNORMAL HIGH (ref 0.0–149.0)
VLDL: 31 mg/dL (ref 0.0–40.0)

## 2011-09-19 ENCOUNTER — Telehealth: Payer: Self-pay | Admitting: Internal Medicine

## 2011-09-19 DIAGNOSIS — I2510 Atherosclerotic heart disease of native coronary artery without angina pectoris: Secondary | ICD-10-CM

## 2011-09-19 NOTE — Telephone Encounter (Signed)
Patient needs refill for Prinizied sent in to [facility].

## 2011-09-20 ENCOUNTER — Other Ambulatory Visit: Payer: Self-pay | Admitting: Gastroenterology

## 2011-09-20 MED ORDER — LISINOPRIL-HYDROCHLOROTHIAZIDE 10-12.5 MG PO TABS: 1.0000 | ORAL_TABLET | Freq: Every day | ORAL | Status: DC

## 2011-09-20 NOTE — Addendum Note (Signed)
Addended by: Meda Klinefelter D on: 09/20/2011 08:25 AM   Modules accepted: Orders

## 2011-09-20 NOTE — Telephone Encounter (Signed)
Patient advised that we can not give him a prescription without him seeing Dr Jarold Motto in the office first. He has scheduled a follow up appointment with Dr Jarold Motto.

## 2011-09-20 NOTE — Telephone Encounter (Signed)
Patient called was told prinzide prescription sent to [facility].

## 2011-09-21 ENCOUNTER — Other Ambulatory Visit: Payer: Self-pay | Admitting: *Deleted

## 2011-09-21 DIAGNOSIS — E782 Mixed hyperlipidemia: Secondary | ICD-10-CM

## 2011-09-21 DIAGNOSIS — I2510 Atherosclerotic heart disease of native coronary artery without angina pectoris: Secondary | ICD-10-CM

## 2011-09-21 MED ORDER — SIMVASTATIN 20 MG PO TABS: 20.0000 mg | ORAL_TABLET | Freq: Every day | ORAL | Status: DC

## 2011-09-21 MED ORDER — LISINOPRIL-HYDROCHLOROTHIAZIDE 10-12.5 MG PO TABS: 1.0000 | ORAL_TABLET | Freq: Every day | ORAL | Status: DC

## 2011-09-24 ENCOUNTER — Encounter (INDEPENDENT_AMBULATORY_CARE_PROVIDER_SITE_OTHER): Payer: Medicare Other | Admitting: Ophthalmology

## 2011-09-24 DIAGNOSIS — H43819 Vitreous degeneration, unspecified eye: Secondary | ICD-10-CM

## 2011-09-24 DIAGNOSIS — H35039 Hypertensive retinopathy, unspecified eye: Secondary | ICD-10-CM

## 2011-09-24 DIAGNOSIS — H43399 Other vitreous opacities, unspecified eye: Secondary | ICD-10-CM

## 2011-09-24 DIAGNOSIS — I10 Essential (primary) hypertension: Secondary | ICD-10-CM

## 2011-09-24 DIAGNOSIS — H3530 Unspecified macular degeneration: Secondary | ICD-10-CM

## 2011-09-24 DIAGNOSIS — H353290 Exudative age-related macular degeneration, unspecified eye, stage unspecified: Secondary | ICD-10-CM

## 2011-09-24 DIAGNOSIS — H35369 Drusen (degenerative) of macula, unspecified eye: Secondary | ICD-10-CM

## 2011-09-24 DIAGNOSIS — H3540 Unspecified peripheral retinal degeneration: Secondary | ICD-10-CM

## 2011-10-18 ENCOUNTER — Ambulatory Visit (INDEPENDENT_AMBULATORY_CARE_PROVIDER_SITE_OTHER): Payer: Medicare Other | Admitting: Gastroenterology

## 2011-10-18 ENCOUNTER — Other Ambulatory Visit: Payer: Medicare Other

## 2011-10-18 ENCOUNTER — Encounter: Payer: Self-pay | Admitting: Gastroenterology

## 2011-10-18 VITALS — BP 128/60 | HR 68 | Ht 65.0 in | Wt 165.4 lb

## 2011-10-18 DIAGNOSIS — R197 Diarrhea, unspecified: Secondary | ICD-10-CM

## 2011-10-18 MED ORDER — COLESEVELAM HCL 625 MG PO TABS: 1875.0000 mg | ORAL_TABLET | Freq: Every day | ORAL | Status: DC

## 2011-10-18 NOTE — Progress Notes (Signed)
This is a difficult and complex 78 year old white male who is never satisfied with his medical care. I have sent him to [facility] for GI evaluation, and he quit going because of again his unhappiness with his medical care. He has chronic, chronic GI complaints of gas, bloating, periodic diarrhea and urgency, gas, bloating, acid reflux, etc. Etc. Multiple workups have failed to uncover any specific diagnosis. Workup for carcinoid syndrome is been entirely negative. He currently is on Prevacid 30 mg a day for acid reflux, daily align, Zocor, Flomax, and hypertension medications he's had no anorexia, weight loss, hepatobiliary or general medical symptoms otherwise such as fever chills. He is status post cholecystectomy. He denies a previous history of pancreatitis, hepatitis, and apparently does not abuse alcohol or cigarettes.  Current Medications, Allergies, Past Medical History, Past Surgical History, Family History and Social History were reviewed in [employer] record.  Pertinent Review of Systems Negative  Allergies  Allergen Reactions  . Ciprofloxacin     REACTION: rash   Outpatient Prescriptions Prior to Visit  Medication Sig Dispense Refill  . lisinopril-hydrochlorothiazide (PRINZIDE,ZESTORETIC) 10-12.5 MG per tablet Take 1 tablet by mouth daily.  90 tablet  3  . metoprolol succinate (TOPROL-XL) 25 MG 24 hr tablet Take 1 tablet (25 mg total) by mouth daily.  90 tablet  3  . Multiple Vitamins-Minerals (PRESERVISION AREDS 2 PO) Take by mouth. 1 TAB TWICE A DAY      . Probiotic Product (ALIGN PO) Take by mouth. 1 TAB DAILY      . simvastatin (ZOCOR) 20 MG tablet Take 1 tablet (20 mg total) by mouth daily.  90 tablet  3  . Tamsulosin HCl (FLOMAX) 0.4 MG CAPS Take by mouth daily.        . Omega-3 Fatty Acids (FISH OIL) 1000 MG CAPS Take by mouth daily.        . pantoprazole (PROTONIX) 40 MG tablet Take 1 tablet (40 mg total) by mouth daily.  90 tablet  3    Past Medical History  Diagnosis Date  . BARRETTS ESOPHAGUS 11/19/2007  . CARDIOMYOPATHY 08/02/2008  . CHANGE IN BOWELS 02/01/2009  . CHF 08/10/2008  . Diarrhea 01/31/2009  . DIVERTICULOSIS, COLON 11/19/2007  . GASTRITIS 11/19/2007  . GERD 11/19/2007  . HIATAL HERNIA 11/19/2007  . HYPERLIPIDEMIA 06/27/2009  . HYPOTHYROIDISM 06/27/2009  . MELENA 08/01/2009  . PERSONAL HX COLONIC POLYPS 02/01/2009  . TINEA CORPORIS 06/27/2009  . TOBACCO USE, QUIT 06/27/2009  . TREMOR, ESSENTIAL 06/27/2009  . HTN (hypertension)   . IBS (irritable bowel syndrome)    Past Surgical History  Procedure Date  . Cholecystectomy   . Lung surgery 1984  . Umbilical hernia repair   . Tonsillectomy    History   Social History  . Marital Status: Widowed    Spouse Name: N/A    Number of Children: N/A  . Years of Education: N/A   Occupational History  . retired Personnel officer    Social History Main Topics  . Smoking status: Former Smoker    Types: Cigarettes    Quit date: 03/26/1962  . Smokeless tobacco: Never Used   Comment: Widowed x 2, has lady friend  . Alcohol Use: Yes     occassional  . Drug Use: No  . Sexually Active: None   Other Topics Concern  . None   Social History Narrative  . None   Family History  Problem Relation Age of Onset  . Heart disease Mother   .  Colon cancer Father   . Prostate cancer Brother        Physical Exam: Blood pressure 120/60, pulse 60 and regular, weight under and 65 pounds with BMI of 27.52. I cannot appreciate stigmata of chronic liver disease. Chest is clear and he is in a regular rhythm without murmurs gallops or rubs. There is no hepatosplenomegaly, abdominal masses, tenderness, and bowel sounds are normal. Mental status is normal and peripheral extremities are unremarkable.    Assessment and Plan: Evaluation at [facility] did not confirm any evidence of carcinoid syndrome. He in the past has been treated for bacterial overgrowth with temporary response. I  suspect he has IBS with an element of bile salt enteropathy related to his cholecystectomy. I have placed him on Welchol to bind up excessive bile salts,FODMAP-IBS diet, and have ordered celiac profile and stool Elastase-1 analysis. I do not think repeated extensive GI evaluation is indicated at this time. This patient very time consuming, repetitive, and always seems somewhat nonchalant about multiple physicians attempts to help his problems. After known this patient for several years, I doubt we will see much positive improvement in his symptomatology.  Please copy her primary care physician, referring physician, and pertinent subspecialists. Encounter Diagnosis  Name Primary?  . Diarrhea Yes

## 2011-10-18 NOTE — Patient Instructions (Addendum)
Your physician has requested that you go to the basement for the following lab work before leaving today: Pancreatic fecal elastase, Celiac panel, Gastrin level. We have sent the following medications to your pharmacy for you to pick up at your convenience:Welchol. We have also given you a Fodmap diet. cc: Robert Bellow, MD

## 2011-10-19 ENCOUNTER — Other Ambulatory Visit: Payer: Medicare Other

## 2011-10-19 DIAGNOSIS — R197 Diarrhea, unspecified: Secondary | ICD-10-CM

## 2011-10-19 LAB — CELIAC PANEL 10
Endomysial Screen: NEGATIVE
IgA: 249 mg/dL (ref 68–379)

## 2011-11-01 ENCOUNTER — Ambulatory Visit (INDEPENDENT_AMBULATORY_CARE_PROVIDER_SITE_OTHER): Payer: Medicare Other | Admitting: Internal Medicine

## 2011-11-01 ENCOUNTER — Telehealth: Payer: Self-pay | Admitting: Gastroenterology

## 2011-11-01 VITALS — BP 148/82 | HR 81 | Temp 97.8°F | Resp 20 | Ht 65.75 in | Wt 158.6 lb

## 2011-11-01 DIAGNOSIS — Z719 Counseling, unspecified: Secondary | ICD-10-CM

## 2011-11-01 DIAGNOSIS — R197 Diarrhea, unspecified: Secondary | ICD-10-CM

## 2011-11-01 DIAGNOSIS — E161 Other hypoglycemia: Secondary | ICD-10-CM

## 2011-11-01 DIAGNOSIS — E164 Increased secretion of gastrin: Secondary | ICD-10-CM

## 2011-11-01 LAB — PANCREATIC ELASTASE, FECAL: Pancreatic Elastase-1, Stool: 318 mcg/g

## 2011-11-01 MED ORDER — COLESEVELAM HCL 625 MG PO TABS: 625.0000 mg | ORAL_TABLET | Freq: Every day | ORAL | Status: DC

## 2011-11-01 NOTE — Progress Notes (Signed)
  Subjective:    Patient ID: [name], male    DOB: [birth year], 78 y.o.   MRN: [id number]  HPI Needs advice. Pt to discuss abnormal high level of gastrin. Has been experiencing regular episodes of diarrhea.  Has decreased food intake, coffee intake has changed to water.  Experiences regular diarrhea.  Will be seeing david patterson at [facility] for abnormally high gastrin results to determine plan for these results.  No weight loss, no anorexia. Dr. Jarold Motto has been his Gi over 10 years.Gastrin of 770 needs to be explained and treated .  Review of Systems     Objective:   Physical Exam  Nursing note and vitals reviewed. Constitutional: He is oriented to person, place, and time. He appears well-developed and well-nourished.  Neck: No thyromegaly present.  Abdominal: Soft. Bowel sounds are normal. He exhibits no mass. There is no tenderness.  Neurological: He is alert and oriented to person, place, and time.  Skin: Skin is warm and dry.   Normal and unchanged       Assessment & Plan:  GI Dr. Jarold Motto to further evaluate and tx Gastrin 770. To f/up with me after GI eval

## 2011-11-01 NOTE — Telephone Encounter (Signed)
Advised pharmacist that per paper order, script should be for welchol 1 tablet daily. Will send corrected rx.

## 2011-11-05 ENCOUNTER — Encounter (INDEPENDENT_AMBULATORY_CARE_PROVIDER_SITE_OTHER): Payer: Medicare Other | Admitting: Ophthalmology

## 2011-11-05 DIAGNOSIS — H43819 Vitreous degeneration, unspecified eye: Secondary | ICD-10-CM

## 2011-11-05 DIAGNOSIS — H3530 Unspecified macular degeneration: Secondary | ICD-10-CM

## 2011-11-05 DIAGNOSIS — I10 Essential (primary) hypertension: Secondary | ICD-10-CM

## 2011-11-05 DIAGNOSIS — H35039 Hypertensive retinopathy, unspecified eye: Secondary | ICD-10-CM

## 2011-11-05 DIAGNOSIS — H353290 Exudative age-related macular degeneration, unspecified eye, stage unspecified: Secondary | ICD-10-CM

## 2011-11-07 ENCOUNTER — Telehealth: Payer: Self-pay | Admitting: Gastroenterology

## 2011-11-07 NOTE — Telephone Encounter (Signed)
Informed pt he is to f/u when he has GI issues or questions; pt stated understanding.

## 2011-12-17 ENCOUNTER — Encounter (INDEPENDENT_AMBULATORY_CARE_PROVIDER_SITE_OTHER): Payer: Medicare Other | Admitting: Ophthalmology

## 2011-12-17 DIAGNOSIS — H3530 Unspecified macular degeneration: Secondary | ICD-10-CM

## 2011-12-17 DIAGNOSIS — I10 Essential (primary) hypertension: Secondary | ICD-10-CM

## 2011-12-17 DIAGNOSIS — H43819 Vitreous degeneration, unspecified eye: Secondary | ICD-10-CM

## 2011-12-17 DIAGNOSIS — H353290 Exudative age-related macular degeneration, unspecified eye, stage unspecified: Secondary | ICD-10-CM

## 2011-12-17 DIAGNOSIS — H35039 Hypertensive retinopathy, unspecified eye: Secondary | ICD-10-CM

## 2011-12-31 ENCOUNTER — Ambulatory Visit: Payer: Medicare Other | Admitting: Internal Medicine

## 2012-02-04 ENCOUNTER — Ambulatory Visit: Payer: Medicare Other

## 2012-02-04 ENCOUNTER — Ambulatory Visit (INDEPENDENT_AMBULATORY_CARE_PROVIDER_SITE_OTHER): Payer: Medicare Other | Admitting: Internal Medicine

## 2012-02-04 ENCOUNTER — Encounter: Payer: Self-pay | Admitting: Internal Medicine

## 2012-02-04 VITALS — BP 110/68 | HR 86 | Temp 98.1°F | Resp 16 | Ht 65.0 in | Wt 155.0 lb

## 2012-02-04 DIAGNOSIS — Z23 Encounter for immunization: Secondary | ICD-10-CM

## 2012-02-04 DIAGNOSIS — I429 Cardiomyopathy, unspecified: Secondary | ICD-10-CM

## 2012-02-04 DIAGNOSIS — I10 Essential (primary) hypertension: Secondary | ICD-10-CM

## 2012-02-04 DIAGNOSIS — N400 Enlarged prostate without lower urinary tract symptoms: Secondary | ICD-10-CM | POA: Insufficient documentation

## 2012-02-04 DIAGNOSIS — Z79899 Other long term (current) drug therapy: Secondary | ICD-10-CM

## 2012-02-04 DIAGNOSIS — E789 Disorder of lipoprotein metabolism, unspecified: Secondary | ICD-10-CM

## 2012-02-04 LAB — BASIC METABOLIC PANEL
CO2: 27 mEq/L (ref 19–32)
Calcium: 9 mg/dL (ref 8.4–10.5)
Chloride: 103 mEq/L (ref 96–112)
Potassium: 4.1 mEq/L (ref 3.5–5.3)
Sodium: 137 mEq/L (ref 135–145)

## 2012-02-04 NOTE — Patient Instructions (Signed)

## 2012-02-04 NOTE — Progress Notes (Signed)
  Subjective:    Patient ID: [name], male    DOB: 12/10/1921, 78 y.o.   MRN: [id number]  HPI Doing well All issues stable  See problem list No meds problems Phx heavy smoker, quit 40 years Exercises regularly, hx of lung surgery long ago Dr. Nedra Hai   Review of Systems unchanged    Objective:   Physical Exam  Constitutional: He is oriented to person, place, and time. He appears well-developed and well-nourished. No distress.  HENT:  Right Ear: External ear normal.  Left Ear: External ear normal.  Nose: Nose normal.  Mouth/Throat: Oropharynx is clear and moist.  Eyes: EOM are normal. No scleral icterus.  Neck: Neck supple. No tracheal deviation present.  Cardiovascular: Normal rate, regular rhythm and normal heart sounds.   Pulmonary/Chest: Effort normal. No accessory muscle usage. Not tachypneic and not bradypneic. He has no decreased breath sounds. He has no wheezes. He has no rhonchi. He has no rales.  Abdominal: Soft. He exhibits no mass. There is no tenderness.  Musculoskeletal: Normal range of motion.  Lymphadenopathy:    He has no cervical adenopathy.  Neurological: He is alert and oriented to person, place, and time. No cranial nerve deficit. Coordination normal.  Psychiatric: He has a normal mood and affect. His behavior is normal. Judgment and thought content normal.    [facility] reading (PRIMARY) by  Dr Perrin Maltese chronic changes        Assessment & Plan:  RF meds 1 yr

## 2012-02-11 ENCOUNTER — Encounter (INDEPENDENT_AMBULATORY_CARE_PROVIDER_SITE_OTHER): Payer: Medicare Other | Admitting: Ophthalmology

## 2012-02-11 DIAGNOSIS — H3530 Unspecified macular degeneration: Secondary | ICD-10-CM

## 2012-02-11 DIAGNOSIS — H35039 Hypertensive retinopathy, unspecified eye: Secondary | ICD-10-CM

## 2012-02-11 DIAGNOSIS — H43819 Vitreous degeneration, unspecified eye: Secondary | ICD-10-CM

## 2012-02-11 DIAGNOSIS — H353290 Exudative age-related macular degeneration, unspecified eye, stage unspecified: Secondary | ICD-10-CM

## 2012-02-11 DIAGNOSIS — I10 Essential (primary) hypertension: Secondary | ICD-10-CM

## 2012-04-03 ENCOUNTER — Telehealth: Payer: Self-pay | Admitting: Internal Medicine

## 2012-04-03 NOTE — Telephone Encounter (Signed)
Called patient back. Not having any problems. States he received a letter in the mail stating he needs to set up an appointment for Feb. To see Dr.Ross. He is leaving for a long trip on 04/28/2012. Set appointment for 2/3 at 2 pm.

## 2012-04-03 NOTE — Telephone Encounter (Signed)
New Problem:    Patient called in wanting to speak with you in reference to his last visit.  Please call back.

## 2012-04-21 ENCOUNTER — Encounter (INDEPENDENT_AMBULATORY_CARE_PROVIDER_SITE_OTHER): Payer: Medicare Other | Admitting: Ophthalmology

## 2012-04-21 DIAGNOSIS — H35039 Hypertensive retinopathy, unspecified eye: Secondary | ICD-10-CM

## 2012-04-21 DIAGNOSIS — H43819 Vitreous degeneration, unspecified eye: Secondary | ICD-10-CM

## 2012-04-21 DIAGNOSIS — H3530 Unspecified macular degeneration: Secondary | ICD-10-CM

## 2012-04-21 DIAGNOSIS — H353290 Exudative age-related macular degeneration, unspecified eye, stage unspecified: Secondary | ICD-10-CM

## 2012-04-21 DIAGNOSIS — I10 Essential (primary) hypertension: Secondary | ICD-10-CM

## 2012-04-28 ENCOUNTER — Encounter: Payer: Self-pay | Admitting: Internal Medicine

## 2012-04-28 ENCOUNTER — Ambulatory Visit (INDEPENDENT_AMBULATORY_CARE_PROVIDER_SITE_OTHER): Payer: Medicare Other | Admitting: Internal Medicine

## 2012-04-28 VITALS — BP 138/75 | HR 57 | Ht 65.0 in | Wt 163.8 lb

## 2012-04-28 DIAGNOSIS — I10 Essential (primary) hypertension: Secondary | ICD-10-CM

## 2012-04-28 NOTE — Progress Notes (Signed)
HPI HPIPatient is an 79 year old with a history of presumed NICM (norml perfusion on myoview), hypertension and dyslipidemia  I saw him in clinic last summer. Since seen he denies CP  NO signif SOB  Active walking daily. Going on cruise in 1 wk. ] Allergies  Allergen Reactions  . Ciprofloxacin     REACTION: rash    Current Outpatient Prescriptions  Medication Sig Dispense Refill  . lisinopril-hydrochlorothiazide (PRINZIDE,ZESTORETIC) 10-12.5 MG per tablet Take 1 tablet by mouth daily.  90 tablet  3  . metoprolol succinate (TOPROL-XL) 25 MG 24 hr tablet Take 1 tablet (25 mg total) by mouth daily.  90 tablet  3  . Multiple Vitamins-Minerals (PRESERVISION AREDS 2 PO) Take by mouth. 1 TAB TWICE A DAY      . pantoprazole (PROTONIX) 40 MG tablet Take 40 mg by mouth daily.      . Probiotic Product (ALIGN PO) Take by mouth. 1 TAB DAILY      . simvastatin (ZOCOR) 20 MG tablet Take 1 tablet (20 mg total) by mouth daily.  90 tablet  3  . Tamsulosin HCl (FLOMAX) 0.4 MG CAPS Take by mouth daily.          Past Medical History  Diagnosis Date  . BARRETTS ESOPHAGUS 11/19/2007  . CARDIOMYOPATHY 08/02/2008  . CHANGE IN BOWELS 02/01/2009  . CHF 08/10/2008  . Diarrhea 01/31/2009  . DIVERTICULOSIS, COLON 11/19/2007  . GASTRITIS 11/19/2007  . GERD 11/19/2007  . HIATAL HERNIA 11/19/2007  . HYPERLIPIDEMIA 06/27/2009  . HYPOTHYROIDISM 06/27/2009  . MELENA 08/01/2009  . PERSONAL HX COLONIC POLYPS 02/01/2009  . TINEA CORPORIS 06/27/2009  . TOBACCO USE, QUIT 06/27/2009  . TREMOR, ESSENTIAL 06/27/2009  . HTN (hypertension)   . IBS (irritable bowel syndrome)     Past Surgical History  Procedure Date  . Cholecystectomy   . Lung surgery 1984  . Umbilical hernia repair   . Tonsillectomy     Family History  Problem Relation Age of Onset  . Heart disease Mother   . Colon cancer Father   . Prostate cancer Brother     History   Social History  . Marital Status: Widowed    Spouse Name: N/A    Number of  Children: N/A  . Years of Education: N/A   Occupational History  . retired Personnel officer    Social History Main Topics  . Smoking status: Former Smoker    Types: Cigarettes    Quit date: 03/26/1962  . Smokeless tobacco: Never Used     Comment: Widowed x 2, has lady friend  . Alcohol Use: Yes     Comment: occassional  . Drug Use: No  . Sexually Active: Not on file   Other Topics Concern  . Not on file   Social History Narrative  . No narrative on file    Review of Systems:  All systems reviewed.  They are negative to the above problem except as previously stated.  Vital Signs: BP 138/75  Pulse 57  Ht 5\' 5"  (1.651 m)  Wt 163 lb 12.8 oz (74.299 kg)  BMI 27.26 kg/m2  Physical Exam Patient is in NAD HEENT:  Normocephalic, atraumatic. EOMI, PERRLA.  Neck: JVP is normal.  No bruits.  Lungs: clear to auscultation. No rales no wheezes.  Heart: Regular rate and rhythm. Normal S1, S2. No S3.   No significant murmurs. PMI not displaced.  Abdomen:  Supple, nontender. Normal bowel sounds. No masses. No hepatomegaly.  Extremities:  Good distal pulses throughout. No lower extremity edema.  Musculoskeletal :moving all extremities.  Neuro:   alert and oriented x3.  CN II-XII grossly intact.  EKG  SB 57 bpm.   Assessment and Plan:   1  Cardiomyopathy  Volume status looks good  I would keep on same regimen  Remains active  2.  HTN  BP is adequate.  3.  HL  LDL was 95 on last check.    Stay active  F/U in Fall.

## 2012-04-28 NOTE — Patient Instructions (Signed)
Your physician wants you to follow-up in: November 2014 with Dr. Tenny Craw. You will receive a reminder letter in the mail two months in advance. If you don't receive a letter, please call our office to schedule the follow-up appointment.

## 2012-06-24 ENCOUNTER — Encounter (INDEPENDENT_AMBULATORY_CARE_PROVIDER_SITE_OTHER): Payer: Medicare Other | Admitting: Ophthalmology

## 2012-06-26 ENCOUNTER — Encounter (INDEPENDENT_AMBULATORY_CARE_PROVIDER_SITE_OTHER): Payer: Medicare Other | Admitting: Ophthalmology

## 2012-06-26 DIAGNOSIS — H3530 Unspecified macular degeneration: Secondary | ICD-10-CM

## 2012-06-26 DIAGNOSIS — I10 Essential (primary) hypertension: Secondary | ICD-10-CM

## 2012-06-26 DIAGNOSIS — H35039 Hypertensive retinopathy, unspecified eye: Secondary | ICD-10-CM

## 2012-06-26 DIAGNOSIS — H353290 Exudative age-related macular degeneration, unspecified eye, stage unspecified: Secondary | ICD-10-CM

## 2012-06-26 DIAGNOSIS — H43819 Vitreous degeneration, unspecified eye: Secondary | ICD-10-CM

## 2012-07-12 ENCOUNTER — Ambulatory Visit (INDEPENDENT_AMBULATORY_CARE_PROVIDER_SITE_OTHER): Payer: Medicare Other | Admitting: Family Medicine

## 2012-07-12 ENCOUNTER — Ambulatory Visit: Payer: Medicare Other

## 2012-07-12 VITALS — BP 137/69 | HR 61 | Temp 97.6°F | Resp 16 | Ht 66.0 in | Wt 165.0 lb

## 2012-07-12 DIAGNOSIS — M79609 Pain in unspecified limb: Secondary | ICD-10-CM

## 2012-07-12 DIAGNOSIS — M25559 Pain in unspecified hip: Secondary | ICD-10-CM

## 2012-07-12 DIAGNOSIS — M79604 Pain in right leg: Secondary | ICD-10-CM

## 2012-07-12 DIAGNOSIS — J029 Acute pharyngitis, unspecified: Secondary | ICD-10-CM

## 2012-07-12 DIAGNOSIS — M25551 Pain in right hip: Secondary | ICD-10-CM

## 2012-07-12 MED ORDER — METHOCARBAMOL 500 MG PO TABS: 500.0000 mg | ORAL_TABLET | Freq: Three times a day (TID) | ORAL | Status: DC

## 2012-07-12 MED ORDER — MELOXICAM 7.5 MG PO TABS: 7.5000 mg | ORAL_TABLET | Freq: Every day | ORAL | Status: DC

## 2012-07-12 NOTE — Patient Instructions (Addendum)
Take Zyrtec (citirazine) one every evening for allergy  Take the muscle relaxant one 3 times daily as needed for the upper thigh pain for a muscle relaxant.   Take the meloxicam one daily with food for pain and inflammation. You may take Tylenol in addition to this if you're hurting more.

## 2012-07-12 NOTE — Progress Notes (Signed)
Subjective: Patient is here for a couple of things he has had a sore throat in the mornings. He has some postnasal drainage. He has a little cough. He used to smoke many years ago. He also has been having pain in his right upper thigh. He feels like it's tightness in the muscles. Has been going on for some time. Bothers him some days worse than others   objective: Throat clear. Neck supple without significant nodes. Chest clear. Heart regular without murmurs. Right hip is a little tighter on range of motion than the left, no point tenderness. No real tenderness of the upper thigh the muscles do feel tight  Assessment: Allergic pharyngitis and cough Right hip pain  Plan: [facility] reading (PRIMARY) by  Dr. Alwyn Ren Minimal arthritic changes of hip, essentially normal  Mobic 7.5 one daily for 10 days Robaxin 500 3 times a day when necessary for hip tightness Zyrtec one each evening  Return if not doing better.

## 2012-08-04 ENCOUNTER — Ambulatory Visit (INDEPENDENT_AMBULATORY_CARE_PROVIDER_SITE_OTHER): Payer: Medicare Other | Admitting: Internal Medicine

## 2012-08-04 ENCOUNTER — Encounter: Payer: Self-pay | Admitting: Internal Medicine

## 2012-08-04 VITALS — BP 152/73 | HR 54 | Temp 97.5°F | Resp 16 | Ht 66.0 in | Wt 162.0 lb

## 2012-08-04 DIAGNOSIS — Z79899 Other long term (current) drug therapy: Secondary | ICD-10-CM

## 2012-08-04 DIAGNOSIS — Z0000 Encounter for general adult medical examination without abnormal findings: Secondary | ICD-10-CM

## 2012-08-04 DIAGNOSIS — I10 Essential (primary) hypertension: Secondary | ICD-10-CM

## 2012-08-04 DIAGNOSIS — E789 Disorder of lipoprotein metabolism, unspecified: Secondary | ICD-10-CM

## 2012-08-04 LAB — COMPREHENSIVE METABOLIC PANEL
Alkaline Phosphatase: 115 U/L (ref 39–117)
BUN: 27 mg/dL — ABNORMAL HIGH (ref 6–23)
Creat: 1.16 mg/dL (ref 0.50–1.35)
Glucose, Bld: 92 mg/dL (ref 70–99)
Total Bilirubin: 0.8 mg/dL (ref 0.3–1.2)

## 2012-08-04 LAB — POCT UA - MICROSCOPIC ONLY
Bacteria, U Microscopic: NEGATIVE
Casts, Ur, LPF, POC: NEGATIVE
Crystals, Ur, HPF, POC: NEGATIVE
Mucus, UA: NEGATIVE

## 2012-08-04 LAB — LIPID PANEL
Cholesterol: 156 mg/dL (ref 0–200)
Total CHOL/HDL Ratio: 5.2 Ratio
Triglycerides: 149 mg/dL (ref <150)
VLDL: 30 mg/dL (ref 0–40)

## 2012-08-04 LAB — CBC WITH DIFFERENTIAL/PLATELET
Basophils Relative: 0 % (ref 0–1)
Eosinophils Absolute: 0.2 10*3/uL (ref 0.0–0.7)
Eosinophils Relative: 3 % (ref 0–5)
HCT: 44.6 % (ref 39.0–52.0)
Hemoglobin: 15.1 g/dL (ref 13.0–17.0)
MCH: 30.1 pg (ref 26.0–34.0)
MCHC: 33.9 g/dL (ref 30.0–36.0)
MCV: 89 fL (ref 78.0–100.0)
Monocytes Absolute: 0.7 10*3/uL (ref 0.1–1.0)
Monocytes Relative: 11 % (ref 3–12)
Neutrophils Relative %: 70 % (ref 43–77)

## 2012-08-04 LAB — POCT URINALYSIS DIPSTICK
Bilirubin, UA: NEGATIVE
Blood, UA: NEGATIVE
Ketones, UA: NEGATIVE
pH, UA: 5.5

## 2012-08-04 LAB — TSH: TSH: 2.906 u[IU]/mL (ref 0.350–4.500)

## 2012-08-04 NOTE — Progress Notes (Signed)
  Subjective:    Patient ID: [name], male    DOB: 1921/07/31, 79 y.o.   MRN: [id number]  HPI Doing well. UTD on shots. HTN, Lipids, BPH all controlled. Very active, happy, has girl friend and lives in his home.  Review of Systems  Constitutional: Negative.   HENT: Positive for hearing loss and tinnitus.   Eyes: Negative.   Respiratory: Negative.   Cardiovascular: Positive for leg swelling.  Gastrointestinal: Positive for diarrhea.  Endocrine: Negative.   Genitourinary: Positive for difficulty urinating.  Musculoskeletal: Positive for arthralgias.  Skin: Negative.   Allergic/Immunologic: Negative.   Neurological: Negative.   Hematological: Negative.   Psychiatric/Behavioral: Negative.        Objective:   Physical Exam  Vitals reviewed. Constitutional: He is oriented to person, place, and time. He appears well-developed and well-nourished.  HENT:  Right Ear: External ear normal.  Left Ear: External ear normal.  Nose: Nose normal.  Mouth/Throat: Oropharynx is clear and moist.  Eyes: Conjunctivae and EOM are normal. Pupils are equal, round, and reactive to light.  Neck: Normal range of motion. Neck supple. No tracheal deviation present. No thyromegaly present.  Cardiovascular: Normal rate, regular rhythm, normal heart sounds and intact distal pulses.  Exam reveals no gallop.   No murmur heard. Pulmonary/Chest: Effort normal and breath sounds normal.  Abdominal: Soft. Bowel sounds are normal. He exhibits no mass.  Genitourinary: Rectum normal, prostate normal and penis normal.  Musculoskeletal: Normal range of motion. He exhibits no edema and no tenderness.  Lymphadenopathy:    He has no cervical adenopathy.  Neurological: He is alert and oriented to person, place, and time. He has normal reflexes. No cranial nerve deficit. He exhibits normal muscle tone. Coordination normal.  Skin: Skin is warm. No rash noted.  Psychiatric: He has a normal mood and affect. His  behavior is normal. Judgment and thought content normal.   Results for orders placed in visit on 08/04/12  POCT URINALYSIS DIPSTICK      Result Value Range   Color, UA yellow     Clarity, UA clear     Glucose, UA neg     Bilirubin, UA neg     Ketones, UA neg     Spec Grav, UA <=1.005     Blood, UA neg     pH, UA 5.5     Protein, UA neg     Urobilinogen, UA 0.2     Nitrite, UA neg     Leukocytes, UA Negative    POCT UA - MICROSCOPIC ONLY      Result Value Range   WBC, Ur, HPF, POC neg     RBC, urine, microscopic 0-1     Bacteria, U Microscopic neg     Mucus, UA neg     Epithelial cells, urine per micros 0-4     Crystals, Ur, HPF, POC neg     Casts, Ur, LPF, POC neg     Yeast, UA neg            Assessment & Plan:  Healthy rf meds 1 yr

## 2012-08-12 ENCOUNTER — Encounter: Payer: Self-pay | Admitting: *Deleted

## 2012-09-04 ENCOUNTER — Encounter (INDEPENDENT_AMBULATORY_CARE_PROVIDER_SITE_OTHER): Payer: Medicare Other | Admitting: Ophthalmology

## 2012-09-04 DIAGNOSIS — H43819 Vitreous degeneration, unspecified eye: Secondary | ICD-10-CM

## 2012-09-04 DIAGNOSIS — H353290 Exudative age-related macular degeneration, unspecified eye, stage unspecified: Secondary | ICD-10-CM

## 2012-09-04 DIAGNOSIS — H3530 Unspecified macular degeneration: Secondary | ICD-10-CM

## 2012-09-04 DIAGNOSIS — H35039 Hypertensive retinopathy, unspecified eye: Secondary | ICD-10-CM

## 2012-09-04 DIAGNOSIS — I10 Essential (primary) hypertension: Secondary | ICD-10-CM

## 2012-09-29 ENCOUNTER — Telehealth: Payer: Self-pay | Admitting: Internal Medicine

## 2012-09-29 DIAGNOSIS — E782 Mixed hyperlipidemia: Secondary | ICD-10-CM

## 2012-09-29 DIAGNOSIS — I2510 Atherosclerotic heart disease of native coronary artery without angina pectoris: Secondary | ICD-10-CM

## 2012-09-29 NOTE — Telephone Encounter (Signed)
Refill Request     METOPROLOL, SIMVASTATIN, and LISINOPRIL

## 2012-10-01 ENCOUNTER — Telehealth: Payer: Self-pay

## 2012-10-01 MED ORDER — SIMVASTATIN 20 MG PO TABS: 20.0000 mg | ORAL_TABLET | Freq: Every day | ORAL | Status: DC

## 2012-10-01 MED ORDER — LISINOPRIL-HYDROCHLOROTHIAZIDE 10-12.5 MG PO TABS: 1.0000 | ORAL_TABLET | Freq: Every day | ORAL | Status: DC

## 2012-10-01 MED ORDER — METOPROLOL SUCCINATE ER 25 MG PO TB24: 25.0000 mg | ORAL_TABLET | Freq: Every day | ORAL | Status: DC

## 2012-10-01 NOTE — Telephone Encounter (Signed)
Waiting for pt to return call to verify what phar -pt uses.Marland Kitchen

## 2012-10-01 NOTE — Telephone Encounter (Signed)
Pt requesting rx refills from [facility]. For lisinopril/hctz-simvastation-toprol xl . I sent these rx's in but pt will be leaving to go to [place]   on July 17,2014 . So if pt has not received his meds . He is to call the office the day  before the his trip  So we can call it into a local pharm

## 2012-10-06 ENCOUNTER — Telehealth: Payer: Self-pay

## 2012-11-13 ENCOUNTER — Encounter (INDEPENDENT_AMBULATORY_CARE_PROVIDER_SITE_OTHER): Payer: Medicare Other | Admitting: Ophthalmology

## 2012-11-13 DIAGNOSIS — H3530 Unspecified macular degeneration: Secondary | ICD-10-CM

## 2012-11-13 DIAGNOSIS — H353290 Exudative age-related macular degeneration, unspecified eye, stage unspecified: Secondary | ICD-10-CM

## 2012-11-13 DIAGNOSIS — H35039 Hypertensive retinopathy, unspecified eye: Secondary | ICD-10-CM

## 2012-11-13 DIAGNOSIS — I10 Essential (primary) hypertension: Secondary | ICD-10-CM

## 2012-12-24 ENCOUNTER — Encounter: Payer: Self-pay | Admitting: Gastroenterology

## 2013-01-20 ENCOUNTER — Encounter: Payer: Self-pay | Admitting: Gastroenterology

## 2013-01-20 ENCOUNTER — Ambulatory Visit (INDEPENDENT_AMBULATORY_CARE_PROVIDER_SITE_OTHER): Payer: Medicare Other | Admitting: Gastroenterology

## 2013-01-20 VITALS — BP 120/70 | HR 59 | Ht 65.0 in | Wt 158.2 lb

## 2013-01-20 DIAGNOSIS — K2270 Barrett's esophagus without dysplasia: Secondary | ICD-10-CM

## 2013-01-20 DIAGNOSIS — K589 Irritable bowel syndrome without diarrhea: Secondary | ICD-10-CM

## 2013-01-20 DIAGNOSIS — Z9049 Acquired absence of other specified parts of digestive tract: Secondary | ICD-10-CM

## 2013-01-20 DIAGNOSIS — Z9089 Acquired absence of other organs: Secondary | ICD-10-CM

## 2013-01-20 DIAGNOSIS — K219 Gastro-esophageal reflux disease without esophagitis: Secondary | ICD-10-CM

## 2013-01-20 MED ORDER — ESOMEPRAZOLE MAGNESIUM 40 MG PO CPDR: 40.0000 mg | DELAYED_RELEASE_CAPSULE | Freq: Every day | ORAL | Status: DC

## 2013-01-20 NOTE — Patient Instructions (Addendum)
It has been recommended to you by your physician that you have a(n) Endoscopy  completed. Per your request, we did not schedule the procedure(s) today. Please contact our office at [phone] should you decide to have the procedure completed.  We have given you samples of the following medication to take: Nexium 40 mg and a prescription was sent to [facility]

## 2013-01-20 NOTE — Progress Notes (Signed)
This is a 79 year old Caucasian male who has chronic diarrhea.  He was felt that he possibly had carcinoid syndrome, but there are [facility] was negative.  His had several extensive workups for his diarrhea, he currently is doing well on homeopathic therapy consisting of ginger, yogurt, and a variety of other fruits and vegetables.  His physician is Dr.Wayneck.  The patient has Barrett's esophagus and is on lansoprazole 15 mg a day.  He denies dysphagia or any significant acid reflux symptoms, but wants his Barrett's screening done.  He has one to 2 formed bowel movements a day without melena or hematochezia, anorexia or weight loss or other lower GI or hepatobiliary or systemic complaints.  Current Medications, Allergies, Past Medical History, Past Surgical History, Family History and Social History were reviewed in [employer] record.  ROS: All systems were reviewed and are negative unless otherwise stated in the HPI.          Physical Exam: Blood pressure 120/70, pulse 59 and regular and weight 158 with a BMI of 26.3.  I cannot appreciate stigmata of chronic liver disease.  His chest is clear he appears to be in a regular rhythm without murmurs gallops or rubs.  There is no organomegaly, abdominal masses or tenderness.  Bowel sounds are normal.  Peripheral extremities are unremarkable and mental status is normal.    Assessment and Plan: Chronic IBS diarrhea controlled with dietary adjustments.  He has chronic GERD, amylase and over-the-counter Prevacid does not take care of his symptoms, therefore we will try Dexilant 60 mg a day.  I have scheduled followup endoscopy with mucosal biopsies.  This patient is up-to-date on his colonoscopy exam and does not need colonoscopy at this time.  Labs done in May by Dr. Robert Bellow are entirely normal including CBC, metabolic profile, TSH and PSA.  He is status post cholecystectomy.  I've asked continue his other  medications as per his other multiple physicians.

## 2013-01-22 ENCOUNTER — Encounter (INDEPENDENT_AMBULATORY_CARE_PROVIDER_SITE_OTHER): Payer: Medicare Other | Admitting: Ophthalmology

## 2013-01-22 DIAGNOSIS — I10 Essential (primary) hypertension: Secondary | ICD-10-CM

## 2013-01-22 DIAGNOSIS — H35039 Hypertensive retinopathy, unspecified eye: Secondary | ICD-10-CM

## 2013-01-22 DIAGNOSIS — H3530 Unspecified macular degeneration: Secondary | ICD-10-CM

## 2013-01-22 DIAGNOSIS — H353290 Exudative age-related macular degeneration, unspecified eye, stage unspecified: Secondary | ICD-10-CM

## 2013-01-22 DIAGNOSIS — H43819 Vitreous degeneration, unspecified eye: Secondary | ICD-10-CM

## 2013-01-23 ENCOUNTER — Ambulatory Visit: Payer: Medicare Other | Admitting: Internal Medicine

## 2013-01-30 ENCOUNTER — Ambulatory Visit (INDEPENDENT_AMBULATORY_CARE_PROVIDER_SITE_OTHER): Payer: Medicare Other | Admitting: Internal Medicine

## 2013-01-30 VITALS — BP 148/68 | HR 59 | Ht 65.0 in | Wt 159.0 lb

## 2013-01-30 DIAGNOSIS — I509 Heart failure, unspecified: Secondary | ICD-10-CM

## 2013-01-30 DIAGNOSIS — I5022 Chronic systolic (congestive) heart failure: Secondary | ICD-10-CM

## 2013-01-30 DIAGNOSIS — I10 Essential (primary) hypertension: Secondary | ICD-10-CM

## 2013-01-30 NOTE — Patient Instructions (Signed)
Your physician wants you to follow-up in: 9 months You will receive a reminder letter in the mail two months in advance. If you don't receive a letter, please call our office to schedule the follow-up appointment.  

## 2013-01-30 NOTE — Progress Notes (Signed)
HPI HPIPatient is an 79 year old with a history of presumed NICM (norml perfusion on myoview), hypertension and dyslipidemia  Since he was last seen he has done well  No CP  Active  No SOB No edema  Walks 1 mile in 15 min.   Walks daily  No longer swimming   ] Allergies  Allergen Reactions  . Ciprofloxacin     REACTION: rash    Current Outpatient Prescriptions  Medication Sig Dispense Refill  . lansoprazole (PREVACID) 15 MG capsule Take 15 mg by mouth daily.      Marland Kitchen lisinopril-hydrochlorothiazide (PRINZIDE,ZESTORETIC) 10-12.5 MG per tablet Take 1 tablet by mouth daily.  90 tablet  3  . metoprolol succinate (TOPROL-XL) 25 MG 24 hr tablet Take 1 tablet (25 mg total) by mouth daily.  90 tablet  3  . Multiple Vitamins-Minerals (PRESERVISION AREDS 2 PO) Take by mouth. 1 TAB TWICE A DAY      . Probiotic Product (ALIGN PO) Take by mouth. 1 TAB DAILY      . simvastatin (ZOCOR) 20 MG tablet Take 1 tablet (20 mg total) by mouth daily.  90 tablet  3  . Tamsulosin HCl (FLOMAX) 0.4 MG CAPS Take by mouth daily.         No current facility-administered medications for this visit.    Past Medical History  Diagnosis Date  . BARRETTS ESOPHAGUS 11/19/2007  . CARDIOMYOPATHY 08/02/2008  . CHANGE IN BOWELS 02/01/2009  . CHF 08/10/2008  . Diarrhea 01/31/2009  . DIVERTICULOSIS, COLON 11/19/2007  . GASTRITIS 11/19/2007  . GERD 11/19/2007  . HIATAL HERNIA 11/19/2007  . HYPERLIPIDEMIA 06/27/2009  . HYPOTHYROIDISM 06/27/2009  . MELENA 08/01/2009  . PERSONAL HX COLONIC POLYPS 02/01/2009  . TINEA CORPORIS 06/27/2009  . TOBACCO USE, QUIT 06/27/2009  . TREMOR, ESSENTIAL 06/27/2009  . HTN (hypertension)   . IBS (irritable bowel syndrome)   . Heart murmur   . Blood transfusion without reported diagnosis     Past Surgical History  Procedure Laterality Date  . Cholecystectomy    . Lung surgery  1984  . Umbilical hernia repair    . Tonsillectomy    . Vasectomy      Family History  Problem Relation Age of Onset  .  Heart disease Mother   . Colon cancer Father   . Prostate cancer Brother     History   Social History  . Marital Status: Widowed    Spouse Name: N/A    Number of Children: N/A  . Years of Education: N/A   Occupational History  . retired Personnel officer    Social History Main Topics  . Smoking status: Former Smoker    Types: Cigarettes    Quit date: 03/26/1962  . Smokeless tobacco: Never Used     Comment: Widowed x 2, has lady friend  . Alcohol Use: No     Comment: occassional  . Drug Use: No  . Sexual Activity: No   Other Topics Concern  . Not on file   Social History Narrative  . No narrative on file    Review of Systems:  All systems reviewed.  They are negative to the above problem except as previously stated.  Vital Signs: BP 148/68  Pulse 59  Ht 5\' 5"  (1.651 m)  Wt 159 lb (72.122 kg)  BMI 26.46 kg/m2  Physical Exam Patient is in NAD HEENT:  Normocephalic, atraumatic. EOMI, PERRLA.  Neck: JVP is normal.  No bruits.  Lungs: clear to auscultation. No  rales no wheezes.  Heart: Regular rate and rhythm. Normal S1, S2. No S3.   No significant murmurs. PMI not displaced.  Abdomen:  Supple, nontender. Normal bowel sounds. No masses. No hepatomegaly.  Extremities:   Good distal pulses throughout. No lower extremity edema.  Musculoskeletal :moving all extremities.  Neuro:   alert and oriented x3.  CN II-XII grossly intact.  EKG  SB 57 bpm.   Assessment and Plan:   1  Cardiomyopathy  Volume status looks good  I would keep on same regimen  Remains active  2.  HTN  BP is adequate for his age  66.  HL  LDL was 95 on last check.    Encouraged him to keep walking  Follow up next summer

## 2013-02-09 ENCOUNTER — Ambulatory Visit (INDEPENDENT_AMBULATORY_CARE_PROVIDER_SITE_OTHER): Payer: Medicare Other | Admitting: Internal Medicine

## 2013-02-09 ENCOUNTER — Encounter: Payer: Self-pay | Admitting: Internal Medicine

## 2013-02-09 VITALS — BP 140/70 | HR 61 | Temp 97.8°F | Resp 16 | Ht 65.5 in | Wt 156.0 lb

## 2013-02-09 DIAGNOSIS — Z23 Encounter for immunization: Secondary | ICD-10-CM

## 2013-02-09 DIAGNOSIS — R141 Gas pain: Secondary | ICD-10-CM

## 2013-02-09 DIAGNOSIS — Z79899 Other long term (current) drug therapy: Secondary | ICD-10-CM

## 2013-02-09 DIAGNOSIS — K219 Gastro-esophageal reflux disease without esophagitis: Secondary | ICD-10-CM

## 2013-02-09 DIAGNOSIS — I10 Essential (primary) hypertension: Secondary | ICD-10-CM

## 2013-02-09 DIAGNOSIS — R142 Eructation: Secondary | ICD-10-CM

## 2013-02-09 NOTE — Progress Notes (Signed)
  Subjective:    Patient ID: [name], male    DOB: 1922-01-20, 79 y.o.   MRN: [id number]  HPI Doing well no problems with meds. Has lipoma on back no problems. Exercises daily. Tolerates meds.   Review of Systems stable    Objective:   Physical Exam  Vitals reviewed. Constitutional: He is oriented to person, place, and time. He appears well-developed and well-nourished. No distress.  HENT:  Head: Normocephalic.  Mouth/Throat: Oropharynx is clear and moist.  Eyes: EOM are normal.  Neck: Normal range of motion. No thyromegaly present.  Cardiovascular: Normal rate, regular rhythm and normal heart sounds.   Pulmonary/Chest: Effort normal and breath sounds normal.  Abdominal: Soft. Bowel sounds are normal. He exhibits no distension and no mass. There is no tenderness. There is no rebound and no guarding.  Musculoskeletal: Normal range of motion.  Lymphadenopathy:    He has no cervical adenopathy.  Neurological: He is alert and oriented to person, place, and time. He exhibits normal muscle tone. Coordination normal.  Psychiatric: He has a normal mood and affect. His behavior is normal.          Assessment & Plan:  Healthy Controlled HTN/GERD

## 2013-02-17 ENCOUNTER — Telehealth: Payer: Self-pay | Admitting: *Deleted

## 2013-02-17 NOTE — Telephone Encounter (Signed)
Spoke with pt and scheduled him to see Dr Rhea Belton on 03/25/13 as a new pt. Pt will need an EGD at [facility] in the future for Barrett's surveillance. Mailed pt a reminder. Pt stated understanding.

## 2013-02-17 NOTE — Telephone Encounter (Signed)
Left message for pt and pt's son to call back to discuss need for rescheduling Endoscopy at hospital

## 2013-02-17 NOTE — Telephone Encounter (Signed)
Dr Jarold Motto: pt is scheduled for EGD on Monday 12/1 for Barrett's surveillance.  Pt is 79 years old and his last EF in 2009 was 25 to 30%.  I talked with Cathlyn Parsons and he said pt would need to have procedure at hospital.  Please advise.  Thanks, Olegario Messier

## 2013-02-17 NOTE — Telephone Encounter (Signed)
Office visit with me 1st at some point in the next several months is appropriate

## 2013-02-17 NOTE — Telephone Encounter (Signed)
Rescheduled for March in hospital with Dr.Pyrtle for one of his hospital days

## 2013-02-17 NOTE — Telephone Encounter (Signed)
Cynthia: Talked with pt.  Explained that Dr Rhea Belton may be the doctor to perform Endoscopy and that procedure would need to be at hospital.  Pt understands that Endoscopy for 12/1 with Dr. Jarold Motto has been cancelled.  I also explained that Dr. Rhea Belton may want to see him in the office before scheduling procedure.  Thanks, Olegario Messier

## 2013-02-23 ENCOUNTER — Encounter: Payer: Medicare Other | Admitting: Gastroenterology

## 2013-03-17 ENCOUNTER — Encounter: Payer: Self-pay | Admitting: Internal Medicine

## 2013-03-25 ENCOUNTER — Encounter: Payer: Self-pay | Admitting: Internal Medicine

## 2013-03-25 ENCOUNTER — Ambulatory Visit (INDEPENDENT_AMBULATORY_CARE_PROVIDER_SITE_OTHER): Payer: Medicare Other | Admitting: Internal Medicine

## 2013-03-25 VITALS — BP 136/60 | HR 64 | Ht 65.0 in | Wt 161.1 lb

## 2013-03-25 DIAGNOSIS — K219 Gastro-esophageal reflux disease without esophagitis: Secondary | ICD-10-CM

## 2013-03-25 DIAGNOSIS — I428 Other cardiomyopathies: Secondary | ICD-10-CM

## 2013-03-25 DIAGNOSIS — K2270 Barrett's esophagus without dysplasia: Secondary | ICD-10-CM

## 2013-03-25 NOTE — Progress Notes (Signed)
Patient ID: [name], male   DOB: [birth year], 79 y.o.   MRN: [id number] HPI: [name] is an 79 yo male with PMH of GERD with Barrett's esophagus, chronic diarrhea felt to be IBS related, cardiomyopathy with EF of approximately 25%, hyperlipidemia, hypothyroidism, hypertension who is seen to discuss repeat EGD for Barrett surveillance. He is here alone today. He was previously seen by Dr. Jarold Motto. He reports he is feeling well and is without specific complaint. He reports his heartburn is well controlled on daily Nexium. He denies dysphagia or odynophagia. He has a history of diarrhea, which was previously worked up at [facility] and reportedly carcinoid was excluded. He has been doing well on homeopathic therapy consisting of ginger, yogurt, and fiber. He is having mostly formed stools now. He denies blood in his stool or melena. He also denies anorexia or weight loss. No fevers or chills. He is still driving and living alone. He reports his wife died 5 years ago but he has become involved with a "lady friend", which makes him very happy.  He is very much instructed and repeat EGD for Barrett surveillance. He reports he's read about Barrett's esophagus on like to know and make sure this is not progressing. He is aware of the low but possible risk of esophageal cancer associated with Barrett  Past Medical History  Diagnosis Date  . BARRETTS ESOPHAGUS 11/19/2007  . CARDIOMYOPATHY 08/02/2008  . CHANGE IN BOWELS 02/01/2009  . CHF 08/10/2008  . Diarrhea 01/31/2009  . DIVERTICULOSIS, COLON 11/19/2007  . GASTRITIS 11/19/2007  . GERD 11/19/2007  . HIATAL HERNIA 11/19/2007  . HYPERLIPIDEMIA 06/27/2009  . HYPOTHYROIDISM 06/27/2009  . MELENA 08/01/2009  . PERSONAL HX COLONIC POLYPS 02/01/2009  . TINEA CORPORIS 06/27/2009  . TOBACCO USE, QUIT 06/27/2009  . TREMOR, ESSENTIAL 06/27/2009  . HTN (hypertension)   . IBS (irritable bowel syndrome)   . Heart murmur   . Blood transfusion without reported  diagnosis     Past Surgical History  Procedure Laterality Date  . Cholecystectomy    . Lung surgery  1984  . Umbilical hernia repair    . Tonsillectomy    . Vasectomy      Current Outpatient Prescriptions  Medication Sig Dispense Refill  . esomeprazole (NEXIUM) 40 MG capsule Take 40 mg by mouth daily at 12 noon.      Marland Kitchen lisinopril-hydrochlorothiazide (PRINZIDE,ZESTORETIC) 10-12.5 MG per tablet Take 1 tablet by mouth daily.  90 tablet  3  . metoprolol succinate (TOPROL-XL) 25 MG 24 hr tablet Take 1 tablet (25 mg total) by mouth daily.  90 tablet  3  . Multiple Vitamins-Minerals (PRESERVISION AREDS 2 PO) Take by mouth. 1 TAB TWICE A DAY      . Probiotic Product (ALIGN PO) Take by mouth. 1 TAB DAILY      . simvastatin (ZOCOR) 20 MG tablet Take 1 tablet (20 mg total) by mouth daily.  90 tablet  3  . Tamsulosin HCl (FLOMAX) 0.4 MG CAPS Take by mouth daily.         No current facility-administered medications for this visit.    Allergies  Allergen Reactions  . Ciprofloxacin     REACTION: rash    Family History  Problem Relation Age of Onset  . Heart disease Mother   . Colon cancer Father   . Prostate cancer Brother     History  Substance Use Topics  . Smoking status: Former Smoker    Types: Cigarettes  Quit date: 03/26/1962  . Smokeless tobacco: Never Used     Comment: Widowed x 2, has lady friend  . Alcohol Use: No     Comment: occassional    ROS: As per history of present illness, otherwise negative  BP 136/60  Pulse 64  Ht 5\' 5"  (1.651 m)  Wt 161 lb 2 oz (73.086 kg)  BMI 26.81 kg/m2 Constitutional: Well-developed and well-nourished. No distress. HEENT: Normocephalic and atraumatic. Oropharynx is clear and moist. No oropharyngeal exudate. Conjunctivae are normal.  No scleral icterus. Pinpoint pupils Neck: Neck supple. Trachea midline. Cardiovascular: Normal rate, regular rhythm and intact distal pulses. 2/6 sem Pulmonary/chest: Effort normal and breath sounds  normal. No wheezing, rales or rhonchi. Abdominal: Soft, nontender, nondistended. Bowel sounds active throughout.  Extremities: no clubbing, cyanosis, or edema Neurological: Alert and oriented to person place and time. Skin: Skin is warm and dry. No rashes noted. Psychiatric: Normal mood and affect. Behavior is normal.  RELEVANT LABS AND IMAGING: CBC    Component Value Date/Time   WBC 6.9 08/04/2012 0921   RBC 5.01 08/04/2012 0921   HGB 15.1 08/04/2012 0921   HCT 44.6 08/04/2012 0921   PLT 255 08/04/2012 0921   MCV 89.0 08/04/2012 0921   MCH 30.1 08/04/2012 0921   MCHC 33.9 08/04/2012 0921   RDW 13.5 08/04/2012 0921   LYMPHSABS 1.1 08/04/2012 0921   MONOABS 0.7 08/04/2012 0921   EOSABS 0.2 08/04/2012 0921   BASOSABS 0.0 08/04/2012 0921    CMP     Component Value Date/Time   NA 138 08/04/2012 0921   K 4.4 08/04/2012 0921   CL 103 08/04/2012 0921   CO2 24 08/04/2012 0921   GLUCOSE 92 08/04/2012 0921   BUN 27* 08/04/2012 0921   CREATININE 1.16 08/04/2012 0921   CREATININE 1.4 11/23/2010 0935   CALCIUM 9.3 08/04/2012 0921   PROT 6.7 08/04/2012 0921   ALBUMIN 4.0 08/04/2012 0921   AST 11 08/04/2012 0921   ALT 9 08/04/2012 0921   ALKPHOS 115 08/04/2012 0921   BILITOT 0.8 08/04/2012 0921   GFRNONAA 49.84 10/06/2009 0856   GFRAA  Value: >60        The eGFR has been calculated using the MDRD equation. This calculation has not been validated in all clinical situations. eGFR's persistently <60 mL/min signify possible Chronic Kidney Disease. 06/29/2008 1003   Labs reviewed performed on 03/03/2013 by Dr. Coral Spikes, MD Iron studies normal, CBC normal, CMP normal except for mild elevation in BUN at 26 and alkaline phosphatase at 128 TSH normal, free T4 and T3 normal A1c 6.0 A.m. cortisol 10.8 Vitamin D normal  EGD -- Nov 2010 Barrett's esophagus was found. DISTAL5CM. BIOPSIED.NO ACUTE  LESIONS,STRICTURE,ETC. NOTED A hiatal hernia was found. 5CM. HH  NOTED. Normal duodenal folds were noted. SMALL  BOWEL BIOPSIES  DONE. The stomach was entered and closely examined. The antrum,  angularis, and lesser curvature were well visualized, including a  retroflexed view of the cardia and fundus. The stomach wall was  normally distensable. The scope passed easily through the pylorus  into the duodenum. Retroflexed views revealed a hiatal hernia.  The scope was then withdrawn from the patient and the procedure  completed.  COMPLICATIONS: None  ENDOSCOPIC IMPRESSION:  1) Barrett's esophagus  2) Hiatal hernia  3) Normal duodenal folds  4) Normal stomach  1.R/O DYSPLASIA  2.R/O CELIAC DISEASE.DOUBT.  3.PROBABLE CHRONIC MALABSORPTION ?? ETIOLOGY.  Path - Barrett's without dysplasia  ASSESSMENT/PLAN: 79 yo male with  PMH of GERD with Barrett's esophagus, chronic diarrhea felt to be IBS related, cardiomyopathy with EF of approximately 25%, hyperlipidemia, hypothyroidism, hypertension who is seen to discuss repeat EGD for Barrett surveillance.  1.  GERD with Barrett's -- he is very much interested and desires repeat upper endoscopy to examine his Barrett's. We have discussed the low rate of progression for nondysplastic Barrett's and the possibility of stopping surveillance, but he prefers to proceed with repeat EGD.  Based on his EF and cardiomyopathy, this test needs to be performed in the outpatient hospital setting. He understands this recommendation and the test will be performed with monitored anesthesia care. We discussed the test today including the risks and benefits and is agreeable to proceed. Surveillance biopsies are planned.  We discussed recommendation for indefinite PPI in the setting of Barrett's esophagus and he understands this recommendation. He will continue Nexium 40 mg daily. It is well-controlling his GERD symptoms.

## 2013-03-25 NOTE — Patient Instructions (Signed)
You have been scheduled for an endoscopy with propofol. Please follow written instructions given to you at your visit today. If you use inhalers (even only as needed), please bring them with you on the day of your procedure. Your physician has requested that you go to [email or web address] and enter the access code given to you at your visit today. This web site gives a general overview about your procedure. However, you should still follow specific instructions given to you by our office regarding your preparation for the procedure.                                                We are excited to introduce MyChart, a new best-in-class service that provides you online access to important information in your electronic medical record. We want to make it easier for you to view your health information - all in one secure location - when and where you need it. We expect MyChart will enhance the quality of care and service we provide.  When you register for MyChart, you can:    View your test results.    Request appointments and receive appointment reminders via email.    Request medication renewals.    View your medical history, allergies, medications and immunizations.    Communicate with your physician's office through a password-protected site.    Conveniently print information such as your medication lists.  To find out if MyChart is right for you, please talk to a member of our clinical staff today. We will gladly answer your questions about this free health and wellness tool.  If you are age 18 or older and want a member of your family to have access to your record, you must provide written consent by completing a proxy form available at our office. Please speak to our clinical staff about guidelines regarding accounts for patients younger than age 18.  As you activate your MyChart account and need any technical assistance, please call the MyChart technical support line at [phone]  ([phone]) or email your question to [email or web address]. If you email your question(s), please include your name, a return phone number and the best time to reach you.  If you have non-urgent health-related questions, you can send a message to our office through MyChart at [email or web address]. If you have a medical emergency, call 911.  Thank you for using MyChart as your new health and wellness resource!   MyChart licensed from [employer],  1999-2010. Patents Pending.   

## 2013-04-02 ENCOUNTER — Encounter (INDEPENDENT_AMBULATORY_CARE_PROVIDER_SITE_OTHER): Payer: Medicare Other | Admitting: Ophthalmology

## 2013-04-02 DIAGNOSIS — H35039 Hypertensive retinopathy, unspecified eye: Secondary | ICD-10-CM

## 2013-04-02 DIAGNOSIS — I10 Essential (primary) hypertension: Secondary | ICD-10-CM

## 2013-04-02 DIAGNOSIS — H43819 Vitreous degeneration, unspecified eye: Secondary | ICD-10-CM

## 2013-04-02 DIAGNOSIS — H3530 Unspecified macular degeneration: Secondary | ICD-10-CM

## 2013-04-02 DIAGNOSIS — H353290 Exudative age-related macular degeneration, unspecified eye, stage unspecified: Secondary | ICD-10-CM

## 2013-04-06 ENCOUNTER — Encounter (HOSPITAL_COMMUNITY): Payer: Self-pay | Admitting: Pharmacy Technician

## 2013-04-13 ENCOUNTER — Ambulatory Visit (INDEPENDENT_AMBULATORY_CARE_PROVIDER_SITE_OTHER): Payer: Medicare Other | Admitting: Internal Medicine

## 2013-04-13 ENCOUNTER — Ambulatory Visit: Payer: Medicare Other

## 2013-04-13 ENCOUNTER — Encounter (HOSPITAL_COMMUNITY): Payer: Self-pay | Admitting: *Deleted

## 2013-04-13 VITALS — BP 120/70 | HR 58 | Temp 97.9°F | Resp 16 | Ht 65.0 in | Wt 158.0 lb

## 2013-04-13 DIAGNOSIS — R059 Cough, unspecified: Secondary | ICD-10-CM

## 2013-04-13 DIAGNOSIS — T7840XA Allergy, unspecified, initial encounter: Secondary | ICD-10-CM

## 2013-04-13 DIAGNOSIS — R05 Cough: Secondary | ICD-10-CM

## 2013-04-13 LAB — POCT CBC
Granulocyte percent: 71.2 %G (ref 37–80)
HCT, POC: 45 % (ref 43.5–53.7)
Hemoglobin: 14 g/dL — AB (ref 14.1–18.1)
Lymph, poc: 1.7 (ref 0.6–3.4)
MCH: 29.7 pg (ref 27–31.2)
MCHC: 31.1 g/dL — AB (ref 31.8–35.4)
MCV: 95.6 fL (ref 80–97)
MID (CBC): 0.9 (ref 0–0.9)
MPV: 9.4 fL (ref 0–99.8)
PLATELET COUNT, POC: 263 10*3/uL (ref 142–424)
POC Granulocyte: 6.3 (ref 2–6.9)
POC LYMPH PERCENT: 19.2 %L (ref 10–50)
POC MID %: 9.6 %M (ref 0–12)
RBC: 4.71 M/uL (ref 4.69–6.13)
RDW, POC: 14.2 %
WBC: 8.9 10*3/uL (ref 4.6–10.2)

## 2013-04-13 LAB — POCT SEDIMENTATION RATE: POCT SED RATE: 35 mm/hr — AB (ref 0–22)

## 2013-04-13 MED ORDER — AZITHROMYCIN 500 MG PO TABS: 500.0000 mg | ORAL_TABLET | Freq: Every day | ORAL | Status: DC

## 2013-04-13 MED ORDER — CETIRIZINE HCL 10 MG PO TABS: 10.0000 mg | ORAL_TABLET | Freq: Every day | ORAL | Status: DC

## 2013-04-13 MED ORDER — HYDROCODONE-ACETAMINOPHEN 7.5-325 MG/15ML PO SOLN: 5.0000 mL | Freq: Four times a day (QID) | ORAL | Status: DC | PRN

## 2013-04-13 NOTE — Progress Notes (Signed)
   Subjective:    Patient ID: [name], male    DOB: [birth year], 80 y.o.   MRN: [id number]  HPI    Review of Systems     Objective:   Physical Exam        Assessment & Plan:

## 2013-04-13 NOTE — Progress Notes (Signed)
   Subjective:    Patient ID: [name], male    DOB: 29-May-1921, 80 y.o.   MRN: [id number]  HPI pt here c/o of cough for 1 month that comes and go, with phlegm (dark yellow color). Has cough, persistent  Denies drainage, otalgia, body aches, nausea or fever.  With some wheezing.  No medicine.      Review of Systems     Objective:   Physical Exam  Vitals reviewed. Constitutional: He is oriented to person, place, and time. He appears well-developed and well-nourished. No distress.  HENT:  Head: Normocephalic.  Right Ear: External ear normal.  Left Ear: External ear normal.  Nose: Mucosal edema, rhinorrhea and sinus tenderness present. Right sinus exhibits no maxillary sinus tenderness and no frontal sinus tenderness. Left sinus exhibits no maxillary sinus tenderness and no frontal sinus tenderness.  Mouth/Throat: Oropharynx is clear and moist.  Eyes: Conjunctivae are normal. Pupils are equal, round, and reactive to light.  Neck: Normal range of motion. Neck supple.  Cardiovascular: Normal rate, regular rhythm and normal heart sounds.   Pulmonary/Chest: Effort normal. Not tachypneic. He has no decreased breath sounds. He has no wheezes. He has rhonchi. He has no rales.  Musculoskeletal: Normal range of motion.  Lymphadenopathy:    He has no cervical adenopathy.  Neurological: He is alert and oriented to person, place, and time. No cranial nerve deficit. He exhibits normal muscle tone. Coordination normal.  Psychiatric: He has a normal mood and affect.   Results for orders placed in visit on 04/13/13  POCT CBC      Result Value Range   WBC 8.9  4.6 - 10.2 K/uL   Lymph, poc 1.7  0.6 - 3.4   POC LYMPH PERCENT 19.2  10 - 50 %L   MID (cbc) 0.9  0 - 0.9   POC MID % 9.6  0 - 12 %M   POC Granulocyte 6.3  2 - 6.9   Granulocyte percent 71.2  37 - 80 %G   RBC 4.71  4.69 - 6.13 M/uL   Hemoglobin 14.0 (*) 14.1 - 18.1 g/dL   HCT, POC 45.0  43.5 - 53.7 %   MCV 95.6  80 - 97 fL   MCH, POC 29.7  27 - 31.2 pg   MCHC 31.1 (*) 31.8 - 35.4 g/dL   RDW, POC 14.2     Platelet Count, POC 263  142 - 424 K/uL   MPV 9.4  0 - 99.8 fL   [facility] reading (PRIMARY) by  Dr.Kalan Yeley cxr no change, no acute process         Assessment & Plan:  Cough Remove feather piullows Zithromax 500mg /Lortab Cepacol lozenges/zyrtec

## 2013-04-13 NOTE — Patient Instructions (Signed)
Allergic Rhinitis Allergic rhinitis is when the mucous membranes in the nose respond to allergens. Allergens are particles in the air that cause your body to have an allergic reaction. This causes you to release allergic antibodies. Through a chain of events, these eventually cause you to release histamine into the blood stream. Although meant to protect the body, it is this release of histamine that causes your discomfort, such as frequent sneezing, congestion, and an itchy, runny nose.  CAUSES  Seasonal allergic rhinitis (hay fever) is caused by pollen allergens that may come from grasses, trees, and weeds. Year-round allergic rhinitis (perennial allergic rhinitis) is caused by allergens such as house dust mites, pet dander, and mold spores.  SYMPTOMS   Nasal stuffiness (congestion).  Itchy, runny nose with sneezing and tearing of the eyes. DIAGNOSIS  Your health care provider can help you determine the allergen or allergens that trigger your symptoms. If you and your health care provider are unable to determine the allergen, skin or blood testing may be used. TREATMENT  Allergic Rhinitis does not have a cure, but it can be controlled by:  Medicines and allergy shots (immunotherapy).  Avoiding the allergen. Hay fever may often be treated with antihistamines in pill or nasal spray forms. Antihistamines block the effects of histamine. There are over-the-counter medicines that may help with nasal congestion and swelling around the eyes. Check with your health care provider before taking or giving this medicine.  If avoiding the allergen or the medicine prescribed do not work, there are many new medicines your health care provider can prescribe. Stronger medicine may be used if initial measures are ineffective. Desensitizing injections can be used if medicine and avoidance does not work. Desensitization is when a patient is given ongoing shots until the body becomes less sensitive to the allergen.  Make sure you follow up with your health care provider if problems continue. HOME CARE INSTRUCTIONS It is not possible to completely avoid allergens, but you can reduce your symptoms by taking steps to limit your exposure to them. It helps to know exactly what you are allergic to so that you can avoid your specific triggers. SEEK MEDICAL CARE IF:   You have a fever.  You develop a cough that does not stop easily (persistent).  You have shortness of breath.  You start wheezing.  Symptoms interfere with normal daily activities. Document Released: 12/05/2000 Document Revised: 12/31/2012 Document Reviewed: 11/17/2012 [employer] Patient Information 2014 [employer].  Cough, Adult  A cough is a reflex that helps clear your throat and airways. It can help heal the body or may be a reaction to an irritated airway. A cough may only last 2 or 3 weeks (acute) or may last more than 8 weeks (chronic).  CAUSES Acute cough:  Viral or bacterial infections. Chronic cough:  Infections.  Allergies.  Asthma.  Post-nasal drip.  Smoking.  Heartburn or acid reflux.  Some medicines.  Chronic lung problems (COPD).  Cancer. SYMPTOMS   Cough.  Fever.  Chest pain.  Increased breathing rate.  High-pitched whistling sound when breathing (wheezing).  Colored mucus that you cough up (sputum). TREATMENT   A bacterial cough may be treated with antibiotic medicine.  A viral cough must run its course and will not respond to antibiotics.  Your caregiver may recommend other treatments if you have a chronic cough. HOME CARE INSTRUCTIONS   Only take over-the-counter or prescription medicines for pain, discomfort, or fever as directed by your caregiver. Use cough suppressants only   as directed by your caregiver.  Use a cold steam vaporizer or humidifier in your bedroom or home to help loosen secretions.  Sleep in a semi-upright position if your cough is worse at night.  Rest as  needed.  Stop smoking if you smoke. SEEK IMMEDIATE MEDICAL CARE IF:   You have pus in your sputum.  Your cough starts to worsen.  You cannot control your cough with suppressants and are losing sleep.  You begin coughing up blood.  You have difficulty breathing.  You develop pain which is getting worse or is uncontrolled with medicine.  You have a fever. MAKE SURE YOU:   Understand these instructions.  Will watch your condition.  Will get help right away if you are not doing well or get worse. Document Released: 09/08/2010 Document Revised: 06/04/2011 Document Reviewed: 09/08/2010 Exit[employer] Patient Information 2014 [employer].  

## 2013-04-28 ENCOUNTER — Encounter (HOSPITAL_COMMUNITY)
Admission: RE | Disposition: A | Discharge status: Home / Self Care | Payer: Self-pay | Source: Ambulatory Visit | Attending: Internal Medicine

## 2013-04-28 ENCOUNTER — Encounter (HOSPITAL_COMMUNITY): Payer: Self-pay

## 2013-04-28 ENCOUNTER — Ambulatory Visit (HOSPITAL_COMMUNITY): Payer: Medicare Other | Admitting: Anesthesiology

## 2013-04-28 ENCOUNTER — Encounter (HOSPITAL_COMMUNITY): Payer: Medicare Other | Admitting: Anesthesiology

## 2013-04-28 ENCOUNTER — Ambulatory Visit (HOSPITAL_COMMUNITY)
Admission: RE | Admit: 2013-04-28 | Discharge: 2013-04-28 | Disposition: A | Discharge status: Home / Self Care | Payer: Medicare Other | Source: Ambulatory Visit | Attending: Internal Medicine | Admitting: Internal Medicine

## 2013-04-28 DIAGNOSIS — K219 Gastro-esophageal reflux disease without esophagitis: Secondary | ICD-10-CM | POA: Insufficient documentation

## 2013-04-28 DIAGNOSIS — K449 Diaphragmatic hernia without obstruction or gangrene: Secondary | ICD-10-CM | POA: Insufficient documentation

## 2013-04-28 DIAGNOSIS — K589 Irritable bowel syndrome without diarrhea: Secondary | ICD-10-CM | POA: Insufficient documentation

## 2013-04-28 DIAGNOSIS — I10 Essential (primary) hypertension: Secondary | ICD-10-CM | POA: Insufficient documentation

## 2013-04-28 DIAGNOSIS — D131 Benign neoplasm of stomach: Secondary | ICD-10-CM | POA: Insufficient documentation

## 2013-04-28 DIAGNOSIS — Z860100 Personal history of colon polyps, unspecified: Secondary | ICD-10-CM | POA: Insufficient documentation

## 2013-04-28 DIAGNOSIS — Z87891 Personal history of nicotine dependence: Secondary | ICD-10-CM | POA: Insufficient documentation

## 2013-04-28 DIAGNOSIS — E785 Hyperlipidemia, unspecified: Secondary | ICD-10-CM | POA: Insufficient documentation

## 2013-04-28 DIAGNOSIS — E039 Hypothyroidism, unspecified: Secondary | ICD-10-CM | POA: Insufficient documentation

## 2013-04-28 DIAGNOSIS — I509 Heart failure, unspecified: Secondary | ICD-10-CM | POA: Insufficient documentation

## 2013-04-28 DIAGNOSIS — K2270 Barrett's esophagus without dysplasia: Secondary | ICD-10-CM | POA: Insufficient documentation

## 2013-04-28 DIAGNOSIS — K5710 Diverticulosis of small intestine without perforation or abscess without bleeding: Secondary | ICD-10-CM | POA: Insufficient documentation

## 2013-04-28 DIAGNOSIS — I428 Other cardiomyopathies: Secondary | ICD-10-CM | POA: Insufficient documentation

## 2013-04-28 DIAGNOSIS — Z9089 Acquired absence of other organs: Secondary | ICD-10-CM | POA: Insufficient documentation

## 2013-04-28 HISTORY — PX: ESOPHAGOGASTRODUODENOSCOPY: SHX5428

## 2013-04-28 SURGERY — EGD (ESOPHAGOGASTRODUODENOSCOPY)
Anesthesia: Monitor Anesthesia Care

## 2013-04-28 SURGERY — Surgical Case
Anesthesia: *Unknown

## 2013-04-28 MED ORDER — PROPOFOL 10 MG/ML IV BOLUS
INTRAVENOUS | Status: AC
  Filled 2013-04-28: qty 20

## 2013-04-28 MED ORDER — KETAMINE HCL 10 MG/ML IJ SOLN
INTRAMUSCULAR | Status: AC
  Filled 2013-04-28: qty 1

## 2013-04-28 MED ORDER — KETAMINE HCL 10 MG/ML IJ SOLN
INTRAMUSCULAR | Status: DC | PRN
  Administered 2013-04-28: 5 mg via INTRAVENOUS
  Administered 2013-04-28: 15 mg via INTRAVENOUS

## 2013-04-28 MED ORDER — BUTAMBEN-TETRACAINE-BENZOCAINE 2-2-14 % EX AERO
INHALATION_SPRAY | CUTANEOUS | Status: DC | PRN
  Administered 2013-04-28: 2 via TOPICAL

## 2013-04-28 MED ORDER — SODIUM CHLORIDE 0.9 % IV SOLN: INTRAVENOUS | Status: DC

## 2013-04-28 MED ORDER — LACTATED RINGERS IV SOLN
INTRAVENOUS | Status: DC
  Administered 2013-04-28: 09:00:00 via INTRAVENOUS

## 2013-04-28 MED ORDER — PROPOFOL INFUSION 10 MG/ML OPTIME
INTRAVENOUS | Status: DC | PRN
  Administered 2013-04-28: 75 ug/kg/min via INTRAVENOUS

## 2013-04-28 MED ORDER — ONDANSETRON HCL 4 MG/2ML IJ SOLN
INTRAMUSCULAR | Status: DC | PRN
  Administered 2013-04-28 (×2): 2 mg via INTRAVENOUS

## 2013-04-28 NOTE — Discharge Instructions (Signed)
YOU HAD AN ENDOSCOPIC PROCEDURE TODAY: Refer to the procedure report that was given to you for any specific questions about what was found during the examination.  If the procedure report does not answer your questions, please call your gastroenterologist to clarify.  YOU SHOULD EXPECT: Some feelings of bloating in the abdomen. Passage of more gas than usual.  Walking can help get rid of the air that was put into your GI tract during the procedure and reduce the bloating. If you had a lower endoscopy (such as a colonoscopy or flexible sigmoidoscopy) you may notice spotting of blood in your stool or on the toilet paper.   DIET: Your first meal following the procedure should be a light meal and then it is ok to progress to your normal diet.  A half-sandwich or bowl of soup is an example of a good first meal.  Heavy or fried foods are harder to digest and may make you feel nasueas or bloated.  Drink plenty of fluids but you should avoid alcoholic beverages for 24 hours.  ACTIVITY: Your care partner should take you home directly after the procedure.  You should plan to take it easy, moving slowly for the rest of the day.  You can resume normal activity the day after the procedure however you should NOT DRIVE or use heavy machinery for 24 hours (because of the sedation medicines used during the test).    SYMPTOMS TO REPORT IMMEDIATELY  A gastroenterologist can be reached at any hour.  Please call your doctor's office for any of the following symptoms:   Following lower endoscopy (colonoscopy, flexible sigmoidoscopy)  Excessive amounts of blood in the stool  Significant tenderness, worsening of abdominal pains  Swelling of the abdomen that is new, acute  Fever of 100 or higher  Following upper endoscopy (EGD, EUS, ERCP)  Vomiting of blood or coffee ground material  New, significant abdominal pain  New, significant chest pain or pain under the shoulder blades  Painful or persistently difficult  swallowing  New shortness of breath  Black, tarry-looking stools  FOLLOW UP: If any biopsies were taken you will be contacted by phone or by letter within the next 1-3 weeks.  Call your gastroenterologist if you have not heard about the biopsies in 3 weeks.  Please also call your gastroenterologist's office with any specific questions about appointments or follow up tests. Esophagogastroduodenoscopy Care After Refer to this sheet in the next few weeks. These instructions provide you with information on caring for yourself after your procedure. Your caregiver may also give you more specific instructions. Your treatment has been planned according to current medical practices, but problems sometimes occur. Call your caregiver if you have any problems or questions after your procedure.  HOME CARE INSTRUCTIONS  Do not eat or drink anything until the numbing medicine (local anesthetic) has worn off and your gag reflex has returned. You will know that the local anesthetic has worn off when you can swallow comfortably.  Do not drive for 12 hours after the procedure or as directed by your caregiver.  Only take medicines as directed by your caregiver. SEEK MEDICAL CARE IF:   You cannot stop coughing.  You are not urinating at all or less than usual. SEEK IMMEDIATE MEDICAL CARE IF:  You have difficulty swallowing.  You cannot eat or drink.  You have worsening throat or chest pain.  You have dizziness, lightheadedness, or you faint.  You have nausea or vomiting.  You have chills.  You have a fever.  You have severe abdominal pain.  You have black, tarry, or bloody stools. Document Released: 02/27/2012 Document Reviewed: 02/27/2012 [facility] Patient Information 2014 [place], [place]. Barrett's Esophagus Barrett's esophagus occurs when the lining of the esophagus is damaged. The esophagus is the tube that carries food from the mouth to the stomach. With Barrett's esophagus, the lining of  the esophagus gets replaced by material that is similar to the lining in the intestines. This process is called intestinal metaplasia. A small number of people with Barrett's esophagus develop esophageal cancer. CAUSES  The exact cause of Barrett's esophagus is unknown. SYMPTOMS  Most people with Barrett's esophagus do not have symptoms. However, many patients also have gastroesophageal reflux disease (GERD). GERD can cause heartburn, trouble swallowing, and a dry cough. DIAGNOSIS Barrett's esophagus is diagnosed by an exam called upper gastrointestinal endoscopy. A thin, flexible tube (endoscope) is passed down the esophagus. The endoscope has a light and camera on the end. Your caregiver uses the endoscope to view the inside of the esophagus. A tissue sample may also be taken and examined under a microscope (biopsy). If cancer cells are found during the biopsy, this condition is called dysplasia. TREATMENT  If you have no dysplasia or low-grade dysplasia, your caregiver may recommend no treatment or only taking medicines to treat GERD. Sometimes, taking acid-blocking drugs to treat GERD helps improve the tissue affected by Barrett's esophagus. Your caregiver may also recommend periodic esophageal exams. If you have high-grade dysplasia, treatment may include removing the damaged parts of the esophagus. This can be done by heating, freezing, or surgically removing the tissue. In some cases, surgery may be done to remove most of the esophagus. The stomach is then attached to the remaining portion of the esophagus. HOME CARE INSTRUCTIONS  Take acid-blocking drugs for GERD if recommended by your caregiver.  Keep all follow-up appointments as directed by your caregiver. You may need periodic esophageal exams. SEEK IMMEDIATE MEDICAL CARE IF:  You have chest pain.  You have trouble swallowing.  You vomit blood or material that looks like coffee grounds.  Your stools are bright red or  dark. Document Released: 06/02/2003 Document Revised: 09/11/2011 Document Reviewed: 05/22/2011 [facility] Patient Information 2014 [place].

## 2013-04-28 NOTE — H&P (Signed)
HPI: [name] is an 80 yo male with PMH of GERD with Barrett's esophagus, chronic diarrhea felt to be IBS related, cardiomyopathy with EF of approximately 25%, hyperlipidemia, hypothyroidism, hypertension who is here today for surveillance EGD. He was seen in the office in late December 2014. We discussed the procedure in detail and he very much wanted to proceed. Anal PPI. He does report frequent belching but denies heartburn, dysphagia or odynophagia. He denies nausea or vomiting. No abdominal pain   He is here today with his son   Past Medical History  Diagnosis Date  . BARRETTS ESOPHAGUS 11/19/2007  . CARDIOMYOPATHY 08/02/2008  . CHANGE IN BOWELS 02/01/2009  . CHF 08/10/2008  . Diarrhea 01/31/2009  . DIVERTICULOSIS, COLON 11/19/2007  . GASTRITIS 11/19/2007  . GERD 11/19/2007  . HIATAL HERNIA 11/19/2007  . HYPERLIPIDEMIA 06/27/2009  . HYPOTHYROIDISM 06/27/2009  . MELENA 08/01/2009  . PERSONAL HX COLONIC POLYPS 02/01/2009  . TINEA CORPORIS 06/27/2009  . TOBACCO USE, QUIT 06/27/2009  . TREMOR, ESSENTIAL 06/27/2009  . HTN (hypertension)   . IBS (irritable bowel syndrome)   . Heart murmur   . Blood transfusion without reported diagnosis     Past Surgical History  Procedure Laterality Date  . Cholecystectomy    . Lung surgery  1984  . Umbilical hernia repair    . Tonsillectomy    . Vasectomy      Current Facility-Administered Medications  Medication Dose Route Frequency Provider Last Rate Last Dose  . 0.9 %  sodium chloride infusion   Intravenous Continuous Jerene Bears, MD      . lactated ringers infusion   Intravenous Continuous Ayesha Mohair, MD        Allergies  Allergen Reactions  . Ciprofloxacin     REACTION: rash  . Penicillins Rash    Family History  Problem Relation Age of Onset  . Heart disease Mother   . Colon cancer Father   . Prostate cancer Brother     History  Substance Use Topics  . Smoking status: Former Smoker -- 3.00 packs/day for 24 years    Types:  Cigarettes    Quit date: 03/27/1943  . Smokeless tobacco: Never Used     Comment: Widowed x 2, has lady friend  . Alcohol Use: No     Comment: occassional    ROS: As per history of present illness, otherwise negative  BP 148/75  Temp(Src) 95 F (35 C) (Axillary)  Resp 18  Ht $R'5\' 5"'bR$  (1.651 m)  Wt 160 lb (72.576 kg)  BMI 26.63 kg/m2  SpO2 97% Gen: awake, alert, NAD HEENT: anicteric, op clear CV: RRR Pulm: CTA b/l Abd: soft, NT/ND, +BS throughout Ext: no c/c/e Neuro: nonfocal  RELEVANT LABS AND IMAGING: CBC    Component Value Date/Time   WBC 8.9 04/13/2013 0938   WBC 6.9 08/04/2012 0921   RBC 4.71 04/13/2013 0938   RBC 5.01 08/04/2012 0921   HGB 14.0* 04/13/2013 0938   HGB 15.1 08/04/2012 0921   HCT 45.0 04/13/2013 0938   HCT 44.6 08/04/2012 0921   PLT 255 08/04/2012 0921   MCV 95.6 04/13/2013 0938   MCV 89.0 08/04/2012 0921   MCH 29.7 04/13/2013 0938   MCH 30.1 08/04/2012 0921   MCHC 31.1* 04/13/2013 0938   MCHC 33.9 08/04/2012 0921   RDW 13.5 08/04/2012 0921   LYMPHSABS 1.1 08/04/2012 0921   MONOABS 0.7 08/04/2012 0921   EOSABS 0.2 08/04/2012 0921   BASOSABS 0.0 08/04/2012 1245  CMP     Component Value Date/Time   NA 138 08/04/2012 0921   K 4.4 08/04/2012 0921   CL 103 08/04/2012 0921   CO2 24 08/04/2012 0921   GLUCOSE 92 08/04/2012 0921   BUN 27* 08/04/2012 0921   CREATININE 1.16 08/04/2012 0921   CREATININE 1.4 11/23/2010 0935   CALCIUM 9.3 08/04/2012 0921   PROT 6.7 08/04/2012 0921   ALBUMIN 4.0 08/04/2012 0921   AST 11 08/04/2012 0921   ALT 9 08/04/2012 0921   ALKPHOS 115 08/04/2012 0921   BILITOT 0.8 08/04/2012 0921   GFRNONAA 49.84 10/06/2009 0856   GFRAA  Value: >60        The eGFR has been calculated using the MDRD equation. This calculation has not been validated in all clinical situations. eGFR's persistently <60 mL/min signify possible Chronic Kidney Disease. 06/29/2008 1003    ASSESSMENT/PLAN: 80 yo male with PMH of GERD with Barrett's esophagus, chronic diarrhea  felt to be IBS related, cardiomyopathy with EF of approximately 25%, hyperlipidemia, hypothyroidism, hypertension who is here today for surveillance EGD.  1.  Barrett's esophagus -- here today for Barrett's surveillance. The nature of the procedure, as well as the risks, benefits, and alternatives were carefully and thoroughly reviewed with the patient. Ample time for discussion and questions allowed. The patient understood, was satisfied, and agreed to proceed. He will continue PPI

## 2013-04-28 NOTE — Op Note (Signed)
[facility] [place], [address]   ENDOSCOPY PROCEDURE REPORT  PATIENT: [name], [name]  MR#: [id number] BIRTHDATE: [birth year] , 89  yrs. old GENDER: Male ENDOSCOPIST: Jerene Bears, MD REFERRED BY:  Lou Miner, M.D. PROCEDURE DATE:  04/28/2013 PROCEDURE:  EGD w/ biopsy ASA CLASS:     Class III INDICATIONS:  history of Barrett's esophagus.   follow up of Barrett's esophagus. MEDICATIONS: MAC sedation, administered by CRNA and See Anesthesia Report. TOPICAL ANESTHETIC: Cetacaine Spray  DESCRIPTION OF PROCEDURE: After the risks benefits and alternatives of the procedure were thoroughly explained, informed consent was obtained.  The [place] [id number] endoscope was introduced through the mouth and advanced to the second portion of the duodenum. Without limitations.  The instrument was slowly withdrawn as the mucosa was fully examined.     ESOPHAGUS: There was evidence of Barrett's esophagus beginning 30 cm from the incisors, and extending to the top of the gastric folds located at 35 cm from the incisors.  The circumferential segment was approximately 2 cm with tongues of Barrett's mucosa extending the additional 3 cm.  There was one very small island of Barrett's mucosa above 1 tongue.  (Prague criteria C2, M5). Multiple, 4 quadrant, biopsies were performed using cold forceps (3 jars). Sample sent for histology.   A 4 cm hiatal hernia was noted, extending to the diaphragmatic hiatus at 39 cm.  STOMACH: A few small, benign-appearing, sessile polyps, likely fundic gland in nature, were found in the cardia and gastric fundus.   The stomach otherwise appeared normal.  DUODENUM: A large diverticulum was found in the 2nd part of the duodenum near the ampulla. There was some retained food debris in the diverticulum.  The duodenal mucosa showed no abnormalities in the bulb and second portion of the duodenum.  Retroflexed views revealed a  hiatal hernia as previously described.. The scope was then withdrawn from the patient and the procedure completed.  COMPLICATIONS: There were no complications.       ENDOSCOPIC IMPRESSION: 1.   There was evidence of Barrett's esophagus; multiple biopsies 2.   4 cm hiatal hernia 3.   Few small sessile polyps were found in the cardia and gastric fundus 4.   The stomach otherwise appeared normal 5.   Diverticulum was found in the 2nd part of the duodenum 6.   The duodenal mucosa showed no abnormalities in the bulb and second portion of the duodenum  RECOMMENDATIONS: 1.  Await biopsy results 2.  Continue taking your PPI (antiacid medicine) once daily.  It is best to be taken 20-30 minutes prior to breakfast meal.  eSigned:  Jerene Bears, MD 04/28/2013 11:37 AM   CC:The Patient and Lou Miner, MD  PATIENT NAME:  [name], [name] MR#: [id number]

## 2013-04-28 NOTE — Anesthesia Preprocedure Evaluation (Signed)
Anesthesia Evaluation  Patient identified by MRN, date of birth, ID band Patient awake    Reviewed: Allergy & Precautions, H&P , NPO status , Patient's Chart, lab work & pertinent test results  Airway Mallampati: II TM Distance: >3 FB Neck ROM: Full    Dental  (+) Edentulous Upper and Dental Advisory Given   Pulmonary former smoker,  breath sounds clear to auscultation  Pulmonary exam normal       Cardiovascular hypertension, +CHF + Valvular Problems/Murmurs Rhythm:Regular Rate:Normal  Cardiomyopathy   Neuro/Psych  Neuromuscular disease negative neurological ROS  negative psych ROS   GI/Hepatic Neg liver ROS, GERD-  ,  Endo/Other  Hypothyroidism   Renal/GU negative Renal ROS  negative genitourinary   Musculoskeletal negative musculoskeletal ROS (+)   Abdominal   Peds  Hematology negative hematology ROS (+)   Anesthesia Other Findings   Reproductive/Obstetrics negative OB ROS                           Anesthesia Physical Anesthesia Plan  ASA: III  Anesthesia Plan: MAC   Post-op Pain Management:    Induction: Intravenous  Airway Management Planned:   Additional Equipment:   Intra-op Plan:   Post-operative Plan: Extubation in OR  Informed Consent: I have reviewed the patients History and Physical, chart, labs and discussed the procedure including the risks, benefits and alternatives for the proposed anesthesia with the patient or authorized representative who has indicated his/her understanding and acceptance.     Plan Discussed with: Anesthesiologist  Anesthesia Plan Comments:         Anesthesia Quick Evaluation

## 2013-04-28 NOTE — Transfer of Care (Signed)
Immediate Anesthesia Transfer of Care Note  Patient: [name]  Procedure(s) Performed: Procedure(s): ESOPHAGOGASTRODUODENOSCOPY (EGD) (N/A)  Patient Location: PACU  Anesthesia Type:MAC  Level of Consciousness: awake, alert  and patient cooperative  Airway & Oxygen Therapy: Patient Spontanous Breathing and Patient connected to nasal cannula oxygen  Post-op Assessment: Report given to PACU RN and Post -op Vital signs reviewed and stable  Post vital signs: stable  Complications: No apparent anesthesia complications

## 2013-04-29 ENCOUNTER — Encounter (HOSPITAL_COMMUNITY): Payer: Self-pay | Admitting: Internal Medicine

## 2013-04-29 NOTE — Anesthesia Postprocedure Evaluation (Signed)
Anesthesia Post Note  Patient: [name]  Procedure(s) Performed: Procedure(s) (LRB): ESOPHAGOGASTRODUODENOSCOPY (EGD) (N/A)  Anesthesia type: MAC  Patient location: PACU  Post pain: Pain level controlled  Post assessment: Post-op Vital signs reviewed  Last Vitals:  Filed Vitals:   04/28/13 1235  BP: 127/95  Pulse:   Temp:   Resp: 14    Post vital signs: Reviewed  Level of consciousness: sedated  Complications: No apparent anesthesia complications

## 2013-05-03 ENCOUNTER — Encounter: Payer: Self-pay | Admitting: Internal Medicine

## 2013-05-18 ENCOUNTER — Ambulatory Visit (INDEPENDENT_AMBULATORY_CARE_PROVIDER_SITE_OTHER): Payer: Medicare Other

## 2013-05-18 ENCOUNTER — Ambulatory Visit (INDEPENDENT_AMBULATORY_CARE_PROVIDER_SITE_OTHER): Payer: Medicare Other | Admitting: Internal Medicine

## 2013-05-18 VITALS — BP 148/70 | HR 80 | Temp 98.1°F | Resp 16 | Ht 65.0 in | Wt 158.0 lb

## 2013-05-18 DIAGNOSIS — R300 Dysuria: Secondary | ICD-10-CM

## 2013-05-18 DIAGNOSIS — N390 Urinary tract infection, site not specified: Secondary | ICD-10-CM

## 2013-05-18 DIAGNOSIS — R309 Painful micturition, unspecified: Secondary | ICD-10-CM

## 2013-05-18 DIAGNOSIS — IMO0001 Reserved for inherently not codable concepts without codable children: Secondary | ICD-10-CM

## 2013-05-18 DIAGNOSIS — R350 Frequency of micturition: Secondary | ICD-10-CM

## 2013-05-18 LAB — POCT URINALYSIS DIPSTICK
Bilirubin, UA: NEGATIVE
Glucose, UA: NEGATIVE
KETONES UA: NEGATIVE
NITRITE UA: POSITIVE
Protein, UA: 30
Spec Grav, UA: 1.02
Urobilinogen, UA: 0.2
pH, UA: 5

## 2013-05-18 LAB — POCT UA - MICROSCOPIC ONLY
Crystals, Ur, HPF, POC: NEGATIVE
Epithelial cells, urine per micros: NEGATIVE
MUCUS UA: POSITIVE
Yeast, UA: NEGATIVE

## 2013-05-18 LAB — POCT CBC
Granulocyte percent: 71.4 %G (ref 37–80)
HCT, POC: 43.4 % — AB (ref 43.5–53.7)
HEMOGLOBIN: 13.3 g/dL — AB (ref 14.1–18.1)
LYMPH, POC: 2 (ref 0.6–3.4)
MCH, POC: 29.4 pg (ref 27–31.2)
MCHC: 30.6 g/dL — AB (ref 31.8–35.4)
MCV: 95.9 fL (ref 80–97)
MID (CBC): 1.3 — AB (ref 0–0.9)
MPV: 10 fL (ref 0–99.8)
PLATELET COUNT, POC: 227 10*3/uL (ref 142–424)
POC Granulocyte: 8.2 — AB (ref 2–6.9)
POC LYMPH PERCENT: 17.5 %L (ref 10–50)
POC MID %: 11.1 %M (ref 0–12)
RBC: 4.53 M/uL — AB (ref 4.69–6.13)
RDW, POC: 14.5 %
WBC: 11.5 10*3/uL — AB (ref 4.6–10.2)

## 2013-05-18 MED ORDER — SULFAMETHOXAZOLE-TRIMETHOPRIM 800-160 MG PO TABS: 1.0000 | ORAL_TABLET | Freq: Two times a day (BID) | ORAL | Status: DC

## 2013-05-18 NOTE — Progress Notes (Signed)
Subjective:    Patient ID: [name], male    DOB: [birth year], 80 y.o.   MRN: [id number]  HPI Pt states he fell at home in the middle of the night about a week ago. He states he had to crawl down the hall back to bed. He believes he fell because he lost some "flexibility" and control with walking. His son wanted to bring him in to seen. He states he started to gain control of his mobility, he believes this resolved because he had a "good bowel movement". He believes his unsteady gait was due to constipation. This problem is resolved.  He also wants to discuss the cough he was seen for about a month ago. He was prescribed zyrtec as well as an antibiotic. He wants to know if he should continue the zyrtec. He states the cough and congestion has resolved completely.    He states he started having trouble starting urination, this has been going on for 2 days. He was afraid he had something going on with his prostate, and is very concerned. He is also having some dysuria, he describes as "burning". He states he is experiencing a lot of urinary frequency. No fever or chills.    Review of Systems    no new sxs Objective:   Physical Exam  Constitutional: He appears well-developed and well-nourished. No distress.  HENT:  Head: Normocephalic.  Eyes: EOM are normal. Pupils are equal, round, and reactive to light. No scleral icterus.  Neck: Normal range of motion.  Cardiovascular: Normal rate, regular rhythm and normal heart sounds.   Pulmonary/Chest: Effort normal and breath sounds normal.  Abdominal: Soft. He exhibits no mass. There is no tenderness.  Genitourinary: Rectum normal, prostate normal and penis normal.  Musculoskeletal: Normal range of motion.  Neurological: He is alert. He exhibits normal muscle tone. Coordination normal.  Skin: He is not diaphoretic.  Psychiatric: He has a normal mood and affect. His behavior is normal. Thought content normal.     Results for orders  placed in visit on 05/18/13  POCT CBC      Result Value Ref Range   WBC 11.5 (*) 4.6 - 10.2 K/uL   Lymph, poc 2.0  0.6 - 3.4   POC LYMPH PERCENT 17.5  10 - 50 %L   MID (cbc) 1.3 (*) 0 - 0.9   POC MID % 11.1  0 - 12 %M   POC Granulocyte 8.2 (*) 2 - 6.9   Granulocyte percent 71.4  37 - 80 %G   RBC 4.53 (*) 4.69 - 6.13 M/uL   Hemoglobin 13.3 (*) 14.1 - 18.1 g/dL   HCT, POC 43.4 (*) 43.5 - 53.7 %   MCV 95.9  80 - 97 fL   MCH, POC 29.4  27 - 31.2 pg   MCHC 30.6 (*) 31.8 - 35.4 g/dL   RDW, POC 14.5     Platelet Count, POC 227  142 - 424 K/uL   MPV 10.0  0 - 99.8 fL  POCT UA - MICROSCOPIC ONLY      Result Value Ref Range   WBC, Ur, HPF, POC 10-15     RBC, urine, microscopic 5-7     Bacteria, U Microscopic 3+     Mucus, UA pos     Epithelial cells, urine per micros neg     Crystals, Ur, HPF, POC neg     Casts, Ur, LPF, POC broad     Yeast, UA neg  POCT URINALYSIS DIPSTICK      Result Value Ref Range   Color, UA yellow     Clarity, UA slightly cloudy     Glucose, UA neg     Bilirubin, UA neg     Ketones, UA neg     Spec Grav, UA 1.020     Blood, UA small     pH, UA 5.0     Protein, UA 30     Urobilinogen, UA 0.2     Nitrite, UA pos     Leukocytes, UA small (1+)     CS urine     Assessment & Plan:  UTI Bactrimds

## 2013-05-18 NOTE — Patient Instructions (Signed)
Urinary Tract Infection  Urinary tract infections (UTIs) can develop anywhere along your urinary tract. Your urinary tract is your body's drainage system for removing wastes and extra water. Your urinary tract includes two kidneys, two ureters, a bladder, and a urethra. Your kidneys are a pair of bean-shaped organs. Each kidney is about the size of your fist. They are located below your ribs, one on each side of your spine.  CAUSES  Infections are caused by microbes, which are microscopic organisms, including fungi, viruses, and bacteria. These organisms are so small that they can only be seen through a microscope. Bacteria are the microbes that most commonly cause UTIs.  SYMPTOMS   Symptoms of UTIs may vary by age and gender of the patient and by the location of the infection. Symptoms in young women typically include a frequent and intense urge to urinate and a painful, burning feeling in the bladder or urethra during urination. Older women and men are more likely to be tired, shaky, and weak and have muscle aches and abdominal pain. A fever may mean the infection is in your kidneys. Other symptoms of a kidney infection include pain in your back or sides below the ribs, nausea, and vomiting.  DIAGNOSIS  To diagnose a UTI, your caregiver will ask you about your symptoms. Your caregiver also will ask to provide a urine sample. The urine sample will be tested for bacteria and white blood cells. White blood cells are made by your body to help fight infection.  TREATMENT   Typically, UTIs can be treated with medication. Because most UTIs are caused by a bacterial infection, they usually can be treated with the use of antibiotics. The choice of antibiotic and length of treatment depend on your symptoms and the type of bacteria causing your infection.  HOME CARE INSTRUCTIONS   If you were prescribed antibiotics, take them exactly as your caregiver instructs you. Finish the medication even if you feel better after you  have only taken some of the medication.   Drink enough water and fluids to keep your urine clear or pale yellow.   Avoid caffeine, tea, and carbonated beverages. They tend to irritate your bladder.   Empty your bladder often. Avoid holding urine for long periods of time.   Empty your bladder before and after sexual intercourse.   After a bowel movement, women should cleanse from front to back. Use each tissue only once.  SEEK MEDICAL CARE IF:    You have back pain.   You develop a fever.   Your symptoms do not begin to resolve within 3 days.  SEEK IMMEDIATE MEDICAL CARE IF:    You have severe back pain or lower abdominal pain.   You develop chills.   You have nausea or vomiting.   You have continued burning or discomfort with urination.  MAKE SURE YOU:    Understand these instructions.   Will watch your condition.   Will get help right away if you are not doing well or get worse.  Document Released: 12/20/2004 Document Revised: 09/11/2011 Document Reviewed: 04/20/2011  [employer] Patient Information 2014 [employer].

## 2013-05-21 ENCOUNTER — Telehealth: Payer: Self-pay

## 2013-05-21 LAB — URINE CULTURE: Colony Count: >=100000

## 2013-05-21 NOTE — Telephone Encounter (Signed)
Dr. Elder Cyphers - Patient changed insurances and needs to start using a new mail order service for his prescriptions. Please call him to discuss [phone].  Patient is also coming to see you Friday so you can wait until he comes in .

## 2013-05-22 ENCOUNTER — Ambulatory Visit (INDEPENDENT_AMBULATORY_CARE_PROVIDER_SITE_OTHER): Payer: Medicare Other | Admitting: Internal Medicine

## 2013-05-22 VITALS — BP 140/68 | HR 61 | Temp 97.4°F | Resp 16 | Ht 66.0 in | Wt 162.0 lb

## 2013-05-22 DIAGNOSIS — N390 Urinary tract infection, site not specified: Secondary | ICD-10-CM

## 2013-05-22 LAB — POCT URINALYSIS DIPSTICK
Bilirubin, UA: NEGATIVE
Glucose, UA: NEGATIVE
Ketones, UA: NEGATIVE
Nitrite, UA: NEGATIVE
PH UA: 5.5
Protein, UA: NEGATIVE
RBC UA: NEGATIVE
Spec Grav, UA: 1.015
UROBILINOGEN UA: 0.2

## 2013-05-22 LAB — POCT UA - MICROSCOPIC ONLY
BACTERIA, U MICROSCOPIC: NEGATIVE
CASTS, UR, LPF, POC: NEGATIVE
CRYSTALS, UR, HPF, POC: NEGATIVE
Mucus, UA: NEGATIVE
Yeast, UA: NEGATIVE

## 2013-05-22 NOTE — Progress Notes (Signed)
   Subjective:    Patient ID: [name], male    DOB: March 12, 1922, 80 y.o.   MRN: [id number]  HPI Pt here for a follow up on a uti.  UTI was affecting his thinking last week.The symptoms have improved a lot.  Denies lower back pain, abdominal pain or dysuria. Feels back to 100%.   Review of Systems     Objective:   Physical Exam  Vitals reviewed. Constitutional: He is oriented to person, place, and time. He appears well-developed and well-nourished. No distress.  HENT:  Head: Normocephalic.  Eyes: EOM are normal. Pupils are equal, round, and reactive to light.  Neck: Normal range of motion.  Pulmonary/Chest: Effort normal.  Neurological: He is alert and oriented to person, place, and time. He exhibits normal muscle tone. Coordination normal.  Psychiatric: He has a normal mood and affect. His behavior is normal.    [facility] Policy for Prescribing Controlled Substances (Revised 01/2012) 1. Prescriptions for controlled substances will be filled by ONE provider at [facility] with whom you have established and developed a plan for your care, including follow-up. 2. You are encouraged to schedule an appointment with your prescriber at our appointment center for follow-up visits whenever possible. 3. If you request a prescription for the controlled substance while at [facility] for an acute problem (with someone other than your regular prescriber), you MAY be given a ONE-TIME prescription for a 30-day supply of the controlled substance, to allow time for you to return to see your regular prescriber for additional prescriptions.  Results for orders placed in visit on 05/22/13  POCT UA - MICROSCOPIC ONLY      Result Value Ref Range   WBC, Ur, HPF, POC 0-2     RBC, urine, microscopic 1-2     Bacteria, U Microscopic neg     Mucus, UA neg     Epithelial cells, urine per micros 0-1     Crystals, Ur, HPF, POC neg     Casts, Ur, LPF, POC neg     Yeast, UA neg    POCT URINALYSIS DIPSTICK      Result  Value Ref Range   Color, UA yellow     Clarity, UA clear     Glucose, UA neg     Bilirubin, UA neg     Ketones, UA neg     Spec Grav, UA 1.015     Blood, UA neg     pH, UA 5.5     Protein, UA neg     Urobilinogen, UA 0.2     Nitrite, UA neg     Leukocytes, UA Trace           Assessment & Plan:  Resolving E.Coli UTI Finish bactrim ds

## 2013-05-27 ENCOUNTER — Telehealth: Payer: Self-pay | Admitting: Internal Medicine

## 2013-05-27 ENCOUNTER — Other Ambulatory Visit: Payer: Self-pay

## 2013-05-27 MED ORDER — SIMVASTATIN 20 MG PO TABS: 20.0000 mg | ORAL_TABLET | Freq: Every day | ORAL | Status: DC

## 2013-05-27 MED ORDER — METOPROLOL SUCCINATE ER 25 MG PO TB24: 25.0000 mg | ORAL_TABLET | Freq: Every day | ORAL | Status: DC

## 2013-05-27 MED ORDER — LISINOPRIL-HYDROCHLOROTHIAZIDE 10-12.5 MG PO TABS: 1.0000 | ORAL_TABLET | Freq: Every day | ORAL | Status: DC

## 2013-05-28 ENCOUNTER — Other Ambulatory Visit: Payer: Self-pay | Admitting: Gastroenterology

## 2013-05-28 ENCOUNTER — Encounter (INDEPENDENT_AMBULATORY_CARE_PROVIDER_SITE_OTHER): Payer: Medicare Other | Admitting: Ophthalmology

## 2013-05-28 DIAGNOSIS — H3530 Unspecified macular degeneration: Secondary | ICD-10-CM

## 2013-05-28 DIAGNOSIS — H35039 Hypertensive retinopathy, unspecified eye: Secondary | ICD-10-CM

## 2013-05-28 DIAGNOSIS — H43819 Vitreous degeneration, unspecified eye: Secondary | ICD-10-CM

## 2013-05-28 DIAGNOSIS — H353290 Exudative age-related macular degeneration, unspecified eye, stage unspecified: Secondary | ICD-10-CM

## 2013-05-28 DIAGNOSIS — I10 Essential (primary) hypertension: Secondary | ICD-10-CM

## 2013-05-28 MED ORDER — ESOMEPRAZOLE MAGNESIUM 40 MG PO CPDR: 40.0000 mg | DELAYED_RELEASE_CAPSULE | Freq: Every day | ORAL | Status: DC

## 2013-05-28 NOTE — Telephone Encounter (Signed)
Spoke to pt. Sent in a new Rx for Esomeprazole (nexium). im sure we will get a prior auth notification, i warned the pt this might happen, and explained that process, and I told him I will call him once I know whats going on. Pt. Verbalized understanding. Also gave him two weeks of samples

## 2013-06-17 ENCOUNTER — Ambulatory Visit (INDEPENDENT_AMBULATORY_CARE_PROVIDER_SITE_OTHER): Payer: Medicare Other | Admitting: Family Medicine

## 2013-06-17 VITALS — BP 102/60 | HR 63 | Temp 97.3°F | Resp 20 | Ht 66.0 in | Wt 162.2 lb

## 2013-06-17 DIAGNOSIS — R309 Painful micturition, unspecified: Secondary | ICD-10-CM

## 2013-06-17 DIAGNOSIS — R300 Dysuria: Secondary | ICD-10-CM

## 2013-06-17 DIAGNOSIS — IMO0001 Reserved for inherently not codable concepts without codable children: Secondary | ICD-10-CM

## 2013-06-17 DIAGNOSIS — N390 Urinary tract infection, site not specified: Secondary | ICD-10-CM

## 2013-06-17 LAB — POCT URINALYSIS DIPSTICK
Bilirubin, UA: NEGATIVE
Glucose, UA: NEGATIVE
Ketones, UA: NEGATIVE
NITRITE UA: POSITIVE
PROTEIN UA: 30
Spec Grav, UA: 1.02
Urobilinogen, UA: 0.2
pH, UA: 5.5

## 2013-06-17 LAB — COMPREHENSIVE METABOLIC PANEL
ALK PHOS: 109 U/L (ref 39–117)
ALT: 10 U/L (ref 0–53)
AST: 13 U/L (ref 0–37)
Albumin: 3.8 g/dL (ref 3.5–5.2)
BUN: 17 mg/dL (ref 6–23)
CO2: 28 mEq/L (ref 19–32)
CREATININE: 1.04 mg/dL (ref 0.50–1.35)
Calcium: 8.8 mg/dL (ref 8.4–10.5)
Chloride: 106 mEq/L (ref 96–112)
Glucose, Bld: 79 mg/dL (ref 70–99)
Potassium: 4.6 mEq/L (ref 3.5–5.3)
Sodium: 142 mEq/L (ref 135–145)
Total Bilirubin: 0.7 mg/dL (ref 0.2–1.2)
Total Protein: 6.3 g/dL (ref 6.0–8.3)

## 2013-06-17 LAB — POCT CBC
GRANULOCYTE PERCENT: 66.8 % (ref 37–80)
HCT, POC: 41.2 % — AB (ref 43.5–53.7)
Hemoglobin: 13 g/dL — AB (ref 14.1–18.1)
Lymph, poc: 1.7 (ref 0.6–3.4)
MCH: 29.4 pg (ref 27–31.2)
MCHC: 31.6 g/dL — AB (ref 31.8–35.4)
MCV: 93.1 fL (ref 80–97)
MID (CBC): 0.9 (ref 0–0.9)
MPV: 10.2 fL (ref 0–99.8)
PLATELET COUNT, POC: 213 10*3/uL (ref 142–424)
POC Granulocyte: 5.1 (ref 2–6.9)
POC LYMPH PERCENT: 22 %L (ref 10–50)
POC MID %: 11.2 % (ref 0–12)
RBC: 4.42 M/uL — AB (ref 4.69–6.13)
RDW, POC: 14.8 %
WBC: 7.6 10*3/uL (ref 4.6–10.2)

## 2013-06-17 LAB — POCT UA - MICROSCOPIC ONLY
CRYSTALS, UR, HPF, POC: NEGATIVE
Casts, Ur, LPF, POC: NEGATIVE
EPITHELIAL CELLS, URINE PER MICROSCOPY: NEGATIVE
Mucus, UA: NEGATIVE
Yeast, UA: NEGATIVE

## 2013-06-17 MED ORDER — SULFAMETHOXAZOLE-TRIMETHOPRIM 800-160 MG PO TABS: 1.0000 | ORAL_TABLET | Freq: Two times a day (BID) | ORAL | Status: DC

## 2013-06-17 NOTE — Patient Instructions (Addendum)
Continue to drink plenty of fluids  Take the sulfa pill pertussis Bactrim) one twice daily with food for the urinary tract infection  Return if worse  Urinary Tract Infection Urinary tract infections (UTIs) can develop anywhere along your urinary tract. Your urinary tract is your body's drainage system for removing wastes and extra water. Your urinary tract includes two kidneys, two ureters, a bladder, and a urethra. Your kidneys are a pair of bean-shaped organs. Each kidney is about the size of your fist. They are located below your ribs, one on each side of your spine. CAUSES Infections are caused by microbes, which are microscopic organisms, including fungi, viruses, and bacteria. These organisms are so small that they can only be seen through a microscope. Bacteria are the microbes that most commonly cause UTIs. SYMPTOMS  Symptoms of UTIs may vary by age and gender of the patient and by the location of the infection. Symptoms in young women typically include a frequent and intense urge to urinate and a painful, burning feeling in the bladder or urethra during urination. Older women and men are more likely to be tired, shaky, and weak and have muscle aches and abdominal pain. A fever may mean the infection is in your kidneys. Other symptoms of a kidney infection include pain in your back or sides below the ribs, nausea, and vomiting. DIAGNOSIS To diagnose a UTI, your caregiver will ask you about your symptoms. Your caregiver also will ask to provide a urine sample. The urine sample will be tested for bacteria and white blood cells. White blood cells are made by your body to help fight infection. TREATMENT  Typically, UTIs can be treated with medication. Because most UTIs are caused by a bacterial infection, they usually can be treated with the use of antibiotics. The choice of antibiotic and length of treatment depend on your symptoms and the type of bacteria causing your infection. HOME CARE  INSTRUCTIONS  If you were prescribed antibiotics, take them exactly as your caregiver instructs you. Finish the medication even if you feel better after you have only taken some of the medication.  Drink enough water and fluids to keep your urine clear or pale yellow.  Avoid caffeine, tea, and carbonated beverages. They tend to irritate your bladder.  Empty your bladder often. Avoid holding urine for long periods of time.  Empty your bladder before and after sexual intercourse.  After a bowel movement, women should cleanse from front to back. Use each tissue only once. SEEK MEDICAL CARE IF:   You have back pain.  You develop a fever.  Your symptoms do not begin to resolve within 3 days. SEEK IMMEDIATE MEDICAL CARE IF:   You have severe back pain or lower abdominal pain.  You develop chills.  You have nausea or vomiting.  You have continued burning or discomfort with urination. MAKE SURE YOU:   Understand these instructions.  Will watch your condition.  Will get help right away if you are not doing well or get worse. Document Released: 12/20/2004 Document Revised: 09/11/2011 Document Reviewed: 04/20/2011 [employer] Patient Information 2014 [employer].

## 2013-06-17 NOTE — Progress Notes (Signed)
Subjective: 80 year old patient known to me who is here with urinary symptoms. He has been having frequent trips to the bathroom, at least every 3 hours. He has been on Flonase for 3 years for his prostate. He thinks that the frequency is draining him. He is getting right to go on a trip to [place] to visit his son, and would like to be well. He has not been running a fever. No other major complaint  Objective: Pleasant and alert though hard of hearing. Throat clear. Neck supple without nodes. Chest clear. Heart regular without this. Abdomen soft and nontender. No CVA tenderness.   Results for orders placed in visit on 06/17/13  POCT UA - MICROSCOPIC ONLY      Result Value Ref Range   WBC, Ur, HPF, POC 33-40     RBC, urine, microscopic 3-5     Bacteria, U Microscopic 2+     Mucus, UA neg     Epithelial cells, urine per micros neg     Crystals, Ur, HPF, POC neg     Casts, Ur, LPF, POC neg     Yeast, UA neg    POCT URINALYSIS DIPSTICK      Result Value Ref Range   Color, UA yellow     Clarity, UA cloudy     Glucose, UA neg     Bilirubin, UA neg     Ketones, UA neg     Spec Grav, UA 1.020     Blood, UA mod     pH, UA 5.5     Protein, UA 30     Urobilinogen, UA 0.2     Nitrite, UA positive     Leukocytes, UA large (3+)     Assessment: Urinary tract infection History of BPH  Plan: Bactrim DS twice a day Drink plenty of fluids Return if worse. Check CBC and C. met occurs of his infection and his upcoming travels.  Results for orders placed in visit on 06/17/13  POCT UA - MICROSCOPIC ONLY      Result Value Ref Range   WBC, Ur, HPF, POC 33-40     RBC, urine, microscopic 3-5     Bacteria, U Microscopic 2+     Mucus, UA neg     Epithelial cells, urine per micros neg     Crystals, Ur, HPF, POC neg     Casts, Ur, LPF, POC neg     Yeast, UA neg    POCT URINALYSIS DIPSTICK      Result Value Ref Range   Color, UA yellow     Clarity, UA cloudy     Glucose, UA neg     Bilirubin, UA neg     Ketones, UA neg     Spec Grav, UA 1.020     Blood, UA mod     pH, UA 5.5     Protein, UA 30     Urobilinogen, UA 0.2     Nitrite, UA positive     Leukocytes, UA large (3+)    POCT CBC      Result Value Ref Range   WBC 7.6  4.6 - 10.2 K/uL   Lymph, poc 1.7  0.6 - 3.4   POC LYMPH PERCENT 22.0  10 - 50 %L   MID (cbc) 0.9  0 - 0.9   POC MID % 11.2  0 - 12 %M   POC Granulocyte 5.1  2 - 6.9   Granulocyte percent 66.8  37 -  80 %G   RBC 4.42 (*) 4.69 - 6.13 M/uL   Hemoglobin 13.0 (*) 14.1 - 18.1 g/dL   HCT, POC 41.2 (*) 43.5 - 53.7 %   MCV 93.1  80 - 97 fL   MCH, POC 29.4  27 - 31.2 pg   MCHC 31.6 (*) 31.8 - 35.4 g/dL   RDW, POC 14.8     Platelet Count, POC 213  142 - 424 K/uL   MPV 10.2  0 - 99.8 fL

## 2013-06-17 NOTE — Addendum Note (Signed)
Addended by: Jethro Bolus A on: 06/17/2013 04:13 PM   Modules accepted: Orders

## 2013-06-18 ENCOUNTER — Encounter: Payer: Self-pay | Admitting: *Deleted

## 2013-06-19 LAB — URINE CULTURE: Colony Count: >=100000

## 2013-07-08 ENCOUNTER — Emergency Department (HOSPITAL_COMMUNITY): Payer: Medicare Other

## 2013-07-08 ENCOUNTER — Encounter (HOSPITAL_COMMUNITY): Payer: Self-pay | Admitting: Emergency Medicine

## 2013-07-08 ENCOUNTER — Ambulatory Visit: Payer: Medicare Other

## 2013-07-08 ENCOUNTER — Ambulatory Visit (INDEPENDENT_AMBULATORY_CARE_PROVIDER_SITE_OTHER): Payer: Medicare Other | Admitting: Family Medicine

## 2013-07-08 ENCOUNTER — Inpatient Hospital Stay (HOSPITAL_COMMUNITY)
Admission: EM | Admit: 2013-07-08 | Discharge: 2013-07-15 | DRG: 871 | Disposition: A | Discharge status: Home Health | Payer: Medicare Other | Attending: Internal Medicine | Admitting: Internal Medicine

## 2013-07-08 VITALS — BP 132/60 | HR 68 | Temp 97.2°F | Resp 16 | Ht 65.25 in | Wt 145.0 lb

## 2013-07-08 DIAGNOSIS — R5383 Other fatigue: Secondary | ICD-10-CM

## 2013-07-08 DIAGNOSIS — G250 Essential tremor: Secondary | ICD-10-CM | POA: Diagnosis present

## 2013-07-08 DIAGNOSIS — K589 Irritable bowel syndrome without diarrhea: Secondary | ICD-10-CM | POA: Diagnosis present

## 2013-07-08 DIAGNOSIS — E860 Dehydration: Secondary | ICD-10-CM

## 2013-07-08 DIAGNOSIS — N400 Enlarged prostate without lower urinary tract symptoms: Secondary | ICD-10-CM | POA: Diagnosis present

## 2013-07-08 DIAGNOSIS — J189 Pneumonia, unspecified organism: Secondary | ICD-10-CM

## 2013-07-08 DIAGNOSIS — G9340 Encephalopathy, unspecified: Secondary | ICD-10-CM | POA: Diagnosis present

## 2013-07-08 DIAGNOSIS — R6520 Severe sepsis without septic shock: Secondary | ICD-10-CM

## 2013-07-08 DIAGNOSIS — K219 Gastro-esophageal reflux disease without esophagitis: Secondary | ICD-10-CM | POA: Diagnosis present

## 2013-07-08 DIAGNOSIS — E46 Unspecified protein-calorie malnutrition: Secondary | ICD-10-CM | POA: Diagnosis present

## 2013-07-08 DIAGNOSIS — Z87891 Personal history of nicotine dependence: Secondary | ICD-10-CM

## 2013-07-08 DIAGNOSIS — IMO0002 Reserved for concepts with insufficient information to code with codable children: Secondary | ICD-10-CM

## 2013-07-08 DIAGNOSIS — M549 Dorsalgia, unspecified: Secondary | ICD-10-CM

## 2013-07-08 DIAGNOSIS — G9349 Other encephalopathy: Secondary | ICD-10-CM | POA: Diagnosis present

## 2013-07-08 DIAGNOSIS — Z79899 Other long term (current) drug therapy: Secondary | ICD-10-CM

## 2013-07-08 DIAGNOSIS — A419 Sepsis, unspecified organism: Principal | ICD-10-CM

## 2013-07-08 DIAGNOSIS — I5043 Acute on chronic combined systolic (congestive) and diastolic (congestive) heart failure: Secondary | ICD-10-CM

## 2013-07-08 DIAGNOSIS — R0902 Hypoxemia: Secondary | ICD-10-CM

## 2013-07-08 DIAGNOSIS — E119 Type 2 diabetes mellitus without complications: Secondary | ICD-10-CM | POA: Diagnosis present

## 2013-07-08 DIAGNOSIS — K2270 Barrett's esophagus without dysplasia: Secondary | ICD-10-CM | POA: Diagnosis present

## 2013-07-08 DIAGNOSIS — Z881 Allergy status to other antibiotic agents status: Secondary | ICD-10-CM

## 2013-07-08 DIAGNOSIS — R634 Abnormal weight loss: Secondary | ICD-10-CM

## 2013-07-08 DIAGNOSIS — D473 Essential (hemorrhagic) thrombocythemia: Secondary | ICD-10-CM

## 2013-07-08 DIAGNOSIS — J40 Bronchitis, not specified as acute or chronic: Secondary | ICD-10-CM

## 2013-07-08 DIAGNOSIS — E861 Hypovolemia: Secondary | ICD-10-CM | POA: Diagnosis present

## 2013-07-08 DIAGNOSIS — Z87440 Personal history of urinary (tract) infections: Secondary | ICD-10-CM

## 2013-07-08 DIAGNOSIS — N182 Chronic kidney disease, stage 2 (mild): Secondary | ICD-10-CM | POA: Diagnosis present

## 2013-07-08 DIAGNOSIS — I129 Hypertensive chronic kidney disease with stage 1 through stage 4 chronic kidney disease, or unspecified chronic kidney disease: Secondary | ICD-10-CM | POA: Diagnosis present

## 2013-07-08 DIAGNOSIS — G252 Other specified forms of tremor: Secondary | ICD-10-CM

## 2013-07-08 DIAGNOSIS — E039 Hypothyroidism, unspecified: Secondary | ICD-10-CM | POA: Diagnosis present

## 2013-07-08 DIAGNOSIS — D75839 Thrombocytosis, unspecified: Secondary | ICD-10-CM

## 2013-07-08 DIAGNOSIS — N390 Urinary tract infection, site not specified: Secondary | ICD-10-CM | POA: Diagnosis present

## 2013-07-08 DIAGNOSIS — E785 Hyperlipidemia, unspecified: Secondary | ICD-10-CM | POA: Diagnosis present

## 2013-07-08 DIAGNOSIS — I428 Other cardiomyopathies: Secondary | ICD-10-CM | POA: Diagnosis present

## 2013-07-08 DIAGNOSIS — I509 Heart failure, unspecified: Secondary | ICD-10-CM | POA: Diagnosis present

## 2013-07-08 DIAGNOSIS — D72829 Elevated white blood cell count, unspecified: Secondary | ICD-10-CM

## 2013-07-08 DIAGNOSIS — N179 Acute kidney failure, unspecified: Secondary | ICD-10-CM

## 2013-07-08 DIAGNOSIS — I10 Essential (primary) hypertension: Secondary | ICD-10-CM | POA: Diagnosis present

## 2013-07-08 DIAGNOSIS — Z880 Allergy status to penicillin: Secondary | ICD-10-CM

## 2013-07-08 DIAGNOSIS — E872 Acidosis, unspecified: Secondary | ICD-10-CM | POA: Diagnosis present

## 2013-07-08 DIAGNOSIS — R6521 Severe sepsis with septic shock: Secondary | ICD-10-CM

## 2013-07-08 DIAGNOSIS — I4891 Unspecified atrial fibrillation: Secondary | ICD-10-CM

## 2013-07-08 DIAGNOSIS — R011 Cardiac murmur, unspecified: Secondary | ICD-10-CM | POA: Diagnosis present

## 2013-07-08 DIAGNOSIS — E43 Unspecified severe protein-calorie malnutrition: Secondary | ICD-10-CM

## 2013-07-08 LAB — POCT URINALYSIS DIPSTICK
Bilirubin, UA: NEGATIVE
Blood, UA: NEGATIVE
Glucose, UA: NEGATIVE
Ketones, UA: NEGATIVE
Nitrite, UA: NEGATIVE
Protein, UA: NEGATIVE
Spec Grav, UA: 1.01
Urobilinogen, UA: 0.2
pH, UA: 5.5

## 2013-07-08 LAB — POCT CBC
Granulocyte percent: 79.6 % (ref 37–80)
HCT, POC: 41.7 % — AB (ref 43.5–53.7)
Hemoglobin: 13.7 g/dL — AB (ref 14.1–18.1)
Lymph, poc: 2.4 (ref 0.6–3.4)
MCH, POC: 29.2 pg (ref 27–31.2)
MCHC: 32.9 g/dL (ref 31.8–35.4)
MCV: 89 fL (ref 80–97)
MID (cbc): 0.8 (ref 0–0.9)
MPV: 9.1 fL (ref 0–99.8)
POC Granulocyte: 12.3 — AB (ref 2–6.9)
POC LYMPH PERCENT: 15.4 %L (ref 10–50)
POC MID %: 5 % (ref 0–12)
Platelet Count, POC: 650 10*3/uL — AB (ref 142–424)
RBC: 4.69 M/uL (ref 4.69–6.13)
RDW, POC: 14.7 %
WBC: 15.5 10*3/uL — AB (ref 4.6–10.2)

## 2013-07-08 LAB — POCT UA - MICROSCOPIC ONLY
Bacteria, U Microscopic: NEGATIVE
Casts, Ur, LPF, POC: NEGATIVE
Crystals, Ur, HPF, POC: NEGATIVE
Mucus, UA: NEGATIVE
RBC, urine, microscopic: NEGATIVE
Yeast, UA: NEGATIVE

## 2013-07-08 LAB — URINALYSIS, ROUTINE W REFLEX MICROSCOPIC
Bilirubin Urine: NEGATIVE
GLUCOSE, UA: NEGATIVE mg/dL
Ketones, ur: NEGATIVE mg/dL
LEUKOCYTES UA: NEGATIVE
Nitrite: NEGATIVE
Protein, ur: NEGATIVE mg/dL
Specific Gravity, Urine: 1.011 (ref 1.005–1.030)
Urobilinogen, UA: 0.2 mg/dL (ref 0.0–1.0)
pH: 5 (ref 5.0–8.0)

## 2013-07-08 LAB — COMPREHENSIVE METABOLIC PANEL
ALT: 17 U/L (ref 0–53)
AST: 18 U/L (ref 0–37)
BUN: 85 mg/dL — ABNORMAL HIGH (ref 6–23)
Calcium: 8.9 mg/dL (ref 8.4–10.5)
Chloride: 97 mEq/L (ref 96–112)
Creat: 3.11 mg/dL — ABNORMAL HIGH (ref 0.50–1.35)
Total Bilirubin: 0.5 mg/dL (ref 0.2–1.2)

## 2013-07-08 LAB — TSH: TSH: 2.658 u[IU]/mL (ref 0.350–4.500)

## 2013-07-08 LAB — CBC
HCT: 30.2 % — ABNORMAL LOW (ref 39.0–52.0)
HEMOGLOBIN: 10.6 g/dL — AB (ref 13.0–17.0)
MCH: 29.7 pg (ref 26.0–34.0)
MCHC: 35.1 g/dL (ref 30.0–36.0)
MCV: 84.6 fL (ref 78.0–100.0)
Platelets: 429 10*3/uL — ABNORMAL HIGH (ref 150–400)
RBC: 3.57 MIL/uL — ABNORMAL LOW (ref 4.22–5.81)
RDW: 14.2 % (ref 11.5–15.5)
WBC: 23.8 10*3/uL — AB (ref 4.0–10.5)

## 2013-07-08 LAB — URINE MICROSCOPIC-ADD ON

## 2013-07-08 LAB — I-STAT CG4 LACTIC ACID, ED: Lactic Acid, Venous: 0.64 mmol/L (ref 0.5–2.2)

## 2013-07-08 LAB — COMPREHENSIVE METABOLIC PANEL WITH GFR
Albumin: 3.3 g/dL — ABNORMAL LOW (ref 3.5–5.2)
Alkaline Phosphatase: 95 U/L (ref 39–117)
CO2: 18 meq/L — ABNORMAL LOW (ref 19–32)
Glucose, Bld: 78 mg/dL (ref 70–99)
Potassium: 4.7 meq/L (ref 3.5–5.3)
Sodium: 131 meq/L — ABNORMAL LOW (ref 135–145)
Total Protein: 6.8 g/dL (ref 6.0–8.3)

## 2013-07-08 LAB — I-STAT TROPONIN, ED: Troponin i, poc: 0.03 ng/mL (ref 0.00–0.08)

## 2013-07-08 LAB — GLUCOSE, POCT (MANUAL RESULT ENTRY): POC Glucose: 91 mg/dL (ref 70–99)

## 2013-07-08 LAB — PRO B NATRIURETIC PEPTIDE: Pro B Natriuretic peptide (BNP): 3764 pg/mL — ABNORMAL HIGH (ref 0–450)

## 2013-07-08 MED ORDER — DEXTROSE 5 % IV SOLN: 1.0000 g | Freq: Every day | INTRAVENOUS | Status: DC

## 2013-07-08 MED ORDER — DEXTROSE 5 % IV SOLN
500.0000 mg | Freq: Once | INTRAVENOUS | Status: AC
  Administered 2013-07-08: 500 mg via INTRAVENOUS

## 2013-07-08 MED ORDER — SODIUM CHLORIDE 0.9 % IV SOLN
1000.0000 mL | Freq: Once | INTRAVENOUS | Status: AC
  Administered 2013-07-08: 1000 mL via INTRAVENOUS

## 2013-07-08 MED ORDER — CEFTRIAXONE SODIUM 1 G IJ SOLR
1.0000 g | Freq: Once | INTRAMUSCULAR | Status: AC
  Administered 2013-07-08: 1 g via INTRAMUSCULAR

## 2013-07-08 MED ORDER — CEPHALEXIN 500 MG PO CAPS: 500.0000 mg | ORAL_CAPSULE | Freq: Two times a day (BID) | ORAL | Status: DC

## 2013-07-08 MED ORDER — DEXTROSE 5 % IV SOLN: 500.0000 mg | Freq: Every day | INTRAVENOUS | Status: DC

## 2013-07-08 MED ORDER — CEFTRIAXONE SODIUM 1 G IJ SOLR
1.0000 g | Freq: Once | INTRAMUSCULAR | Status: AC
  Administered 2013-07-08: 1 g via INTRAVENOUS
  Filled 2013-07-08: qty 10

## 2013-07-08 MED ORDER — SODIUM CHLORIDE 0.9 % IV SOLN
1000.0000 mL | INTRAVENOUS | Status: DC
  Administered 2013-07-09: 1000 mL via INTRAVENOUS

## 2013-07-08 NOTE — ED Notes (Signed)
Pt denies pain.

## 2013-07-08 NOTE — ED Notes (Signed)
MD aware of Hypotension. BNP ordered.  Pt will get 555mL of last Bolus

## 2013-07-08 NOTE — ED Notes (Signed)
Raquel Sarna (daughter in [phone]. Please call if any change in status.

## 2013-07-08 NOTE — ED Notes (Addendum)
Pt from home via [facility] c/o of weakness since 4/1. He was seen at his PCP today Dx with UTI and was given IM injection of Rocephin , no RX of PO meds. Pt alert and oriented x 4. Pt has 500 mL NS enroute.

## 2013-07-08 NOTE — ED Notes (Signed)
Bed: [facility]70 Expected date:  Expected time:  Means of arrival:  Comments: EMS/weakness/hx UTI

## 2013-07-08 NOTE — Patient Instructions (Signed)
Urinary Tract Infection  Urinary tract infections (UTIs) can develop anywhere along your urinary tract. Your urinary tract is your body's drainage system for removing wastes and extra water. Your urinary tract includes two kidneys, two ureters, a bladder, and a urethra. Your kidneys are a pair of bean-shaped organs. Each kidney is about the size of your fist. They are located below your ribs, one on each side of your spine.  CAUSES  Infections are caused by microbes, which are microscopic organisms, including fungi, viruses, and bacteria. These organisms are so small that they can only be seen through a microscope. Bacteria are the microbes that most commonly cause UTIs.  SYMPTOMS   Symptoms of UTIs may vary by age and gender of the patient and by the location of the infection. Symptoms in young women typically include a frequent and intense urge to urinate and a painful, burning feeling in the bladder or urethra during urination. Older women and men are more likely to be tired, shaky, and weak and have muscle aches and abdominal pain. A fever may mean the infection is in your kidneys. Other symptoms of a kidney infection include pain in your back or sides below the ribs, nausea, and vomiting.  DIAGNOSIS  To diagnose a UTI, your caregiver will ask you about your symptoms. Your caregiver also will ask to provide a urine sample. The urine sample will be tested for bacteria and white blood cells. White blood cells are made by your body to help fight infection.  TREATMENT   Typically, UTIs can be treated with medication. Because most UTIs are caused by a bacterial infection, they usually can be treated with the use of antibiotics. The choice of antibiotic and length of treatment depend on your symptoms and the type of bacteria causing your infection.  HOME CARE INSTRUCTIONS   If you were prescribed antibiotics, take them exactly as your caregiver instructs you. Finish the medication even if you feel better after you  have only taken some of the medication.   Drink enough water and fluids to keep your urine clear or pale yellow.   Avoid caffeine, tea, and carbonated beverages. They tend to irritate your bladder.   Empty your bladder often. Avoid holding urine for long periods of time.   Empty your bladder before and after sexual intercourse.   After a bowel movement, women should cleanse from front to back. Use each tissue only once.  SEEK MEDICAL CARE IF:    You have back pain.   You develop a fever.   Your symptoms do not begin to resolve within 3 days.  SEEK IMMEDIATE MEDICAL CARE IF:    You have severe back pain or lower abdominal pain.   You develop chills.   You have nausea or vomiting.   You have continued burning or discomfort with urination.  MAKE SURE YOU:    Understand these instructions.   Will watch your condition.   Will get help right away if you are not doing well or get worse.  Document Released: 12/20/2004 Document Revised: 09/11/2011 Document Reviewed: 04/20/2011  [employer] Patient Information 2014 [employer].

## 2013-07-08 NOTE — ED Provider Notes (Signed)
CSN: [id number]     Arrival date & time 07/08/13  2132 History   First MD Initiated Contact with Patient 07/08/13 2148     Chief Complaint  Patient presents with  . Weakness  . Urinary Tract Infection     (Consider location/radiation/quality/duration/timing/severity/associated sxs/prior Treatment) Patient is a 80 y.o. male presenting with weakness, urinary tract infection, and cough. The history is provided by the patient.  Weakness This is a new problem. The current episode started more than 1 week ago. The problem occurs constantly. The problem has not changed since onset.Pertinent negatives include no chest pain and no shortness of breath. Nothing aggravates the symptoms. Nothing relieves the symptoms. He has tried nothing for the symptoms.  Urinary Tract Infection Pertinent negatives include no chest pain and no shortness of breath.  Cough Cough characteristics:  Productive Severity:  Moderate Onset quality:  Gradual Timing:  Constant Progression:  Worsening Chronicity:  New Associated symptoms: no chest pain, no chills, no fever and no shortness of breath     Past Medical History  Diagnosis Date  . BARRETTS ESOPHAGUS 11/19/2007  . CARDIOMYOPATHY 08/02/2008  . CHANGE IN BOWELS 02/01/2009  . CHF 08/10/2008  . Diarrhea 01/31/2009  . DIVERTICULOSIS, COLON 11/19/2007  . GASTRITIS 11/19/2007  . GERD 11/19/2007  . HIATAL HERNIA 11/19/2007  . HYPERLIPIDEMIA 06/27/2009  . HYPOTHYROIDISM 06/27/2009  . MELENA 08/01/2009  . PERSONAL HX COLONIC POLYPS 02/01/2009  . TINEA CORPORIS 06/27/2009  . TOBACCO USE, QUIT 06/27/2009  . TREMOR, ESSENTIAL 06/27/2009  . HTN (hypertension)   . IBS (irritable bowel syndrome)   . Heart murmur   . Blood transfusion without reported diagnosis    Past Surgical History  Procedure Laterality Date  . Cholecystectomy    . Lung surgery  1984  . Umbilical hernia repair    . Tonsillectomy    . Vasectomy    . Esophagogastroduodenoscopy N/A 04/28/2013    Procedure:  ESOPHAGOGASTRODUODENOSCOPY (EGD);  Surgeon: Jerene Bears, MD;  Location: [facility];  Service: Gastroenterology;  Laterality: N/A;   Family History  Problem Relation Age of Onset  . Heart disease Mother   . Colon cancer Father   . Prostate cancer Brother    History  Substance Use Topics  . Smoking status: Former Smoker -- 3.00 packs/day for 24 years    Types: Cigarettes    Quit date: 03/27/1943  . Smokeless tobacco: Never Used     Comment: Widowed x 2, has lady friend  . Alcohol Use: No     Comment: occassional    Review of Systems  Constitutional: Negative for fever and chills.  Respiratory: Positive for cough. Negative for shortness of breath.   Cardiovascular: Negative for chest pain and leg swelling.  Gastrointestinal: Negative for vomiting and diarrhea.  Neurological: Positive for weakness.  All other systems reviewed and are negative.     Allergies  Ciprofloxacin and Penicillins  Home Medications   Prior to Admission medications   Medication Sig Start Date End Date Taking? Authorizing Provider  cephALEXin (KEFLEX) 500 MG capsule Take 1 capsule (500 mg total) by mouth 2 (two) times daily. 07/08/13   Thao P Le, DO  esomeprazole (NEXIUM) 40 MG capsule Take 1 capsule (40 mg total) by mouth daily at 12 noon. 05/28/13   Jerene Bears, MD  HYDROcodone-acetaminophen (HYCET) 7.5-325 mg/15 ml solution Take 5 mLs by mouth every 6 (six) hours as needed (or cough). 04/13/13   Orma Flaming, MD  lisinopril-hydrochlorothiazide (PRINZIDE,ZESTORETIC) 10-12.5 MG  per tablet Take 1 tablet by mouth daily with breakfast. 05/27/13   Fay Records, MD  metoprolol succinate (TOPROL-XL) 25 MG 24 hr tablet Take 1 tablet (25 mg total) by mouth daily with breakfast. 05/27/13   Fay Records, MD  Multiple Vitamins-Minerals (PRESERVISION AREDS 2 PO) Take 1 tablet by mouth 2 (two) times daily. 1 TAB TWICE A DAY    Historical Provider, MD  Probiotic Product (ALIGN PO) Take 1 tablet by mouth daily.      Historical Provider, MD  simvastatin (ZOCOR) 20 MG tablet Take 1 tablet (20 mg total) by mouth daily. 05/27/13   Fay Records, MD  sulfamethoxazole-trimethoprim (BACTRIM DS,SEPTRA DS) 800-160 MG per tablet Take 1 tablet by mouth 2 (two) times daily. 06/17/13   Posey Boyer, MD  Tamsulosin HCl (FLOMAX) 0.4 MG CAPS Take 0.4 mg by mouth daily.     Historical Provider, MD   BP 90/46  Pulse 82  Temp(Src) 97.7 F (36.5 C) (Oral)  Resp 18  SpO2 90% Physical Exam  Nursing note and vitals reviewed. Constitutional: He is oriented to person, place, and time. He appears well-developed and well-nourished. No distress.  HENT:  Head: Normocephalic and atraumatic.  Mouth/Throat: No oropharyngeal exudate.  Eyes: EOM are normal. Pupils are equal, round, and reactive to light.  Neck: Normal range of motion. Neck supple.  Cardiovascular: Normal rate and regular rhythm.  Exam reveals no friction rub.   No murmur heard. Pulmonary/Chest: Effort normal. No respiratory distress. He has decreased breath sounds (mild, diffusely). He has no wheezes. He has no rales.  Abdominal: Soft. He exhibits no distension. There is no tenderness. There is no rebound.  Musculoskeletal: Normal range of motion. He exhibits no edema.  Neurological: He is alert and oriented to person, place, and time. No cranial nerve deficit. He exhibits normal muscle tone. Coordination normal.  Skin: No rash noted. He is not diaphoretic.    ED Course  Procedures (including critical care time) Labs Review Labs Reviewed  CULTURE, BLOOD (ROUTINE X 2)  CULTURE, BLOOD (ROUTINE X 2)  URINE CULTURE  URINALYSIS, ROUTINE W REFLEX MICROSCOPIC  CBC  I-STAT CG4 LACTIC ACID, ED    Imaging Review Dg Thoracic Spine 2 View  07/08/2013   CLINICAL DATA:  [place] on wet floor getting out of shower 3 weeks ago, back pain  EXAM: THORACIC SPINE - 2 VIEW  COMPARISON:  Chest radiographs 04/13/2013  FINDINGS: Twelve pairs of ribs.  Diffuse osseous  demineralization.  Biconvex thoracolumbar scoliosis, mild.  Scattered disc space narrowing.  Superior endplate compression fracture of T7 vertebra, less than 50% height loss, stable.  Superior endplate compression fracture T12, approximately 50% height loss, stable.  No additional fracture or subluxation.  Atherosclerotic calcification aorta.  Extensive calcified pleural plaque formation in both hemithoraces.  IMPRESSION: Stable superior endplate compression fractures of T7 and T12.  Osseous demineralization with biconvex thoracolumbar scoliosis.  No definite acute osseous abnormalities.   Electronically Signed   By: Lavonia Dana M.D.   On: 07/08/2013 14:21   Dg Lumbar Spine 2-3 Views  07/08/2013   CLINICAL DATA:  Low back pain.  EXAM: LUMBAR SPINE - 2-3 VIEW  COMPARISON:  DG THORACIC SPINE 2V dated 07/08/2013  FINDINGS: The lumbar spine demonstrates osteopenia. Compression fracture of T12 was noted on the thoracic plain films. There is mild leftward convex curvature of the lumbar spine. Mild disc space narrowing at L4-5 with associated anterolisthesis of L4 on L5 of approximately 7 mm.  No bony lesions are seen. Atherosclerotic calcifications are identified in the abdominal aorta and iliac arteries.  IMPRESSION: Spondylosis at L4-5 with 7 mm anterolisthesis of L4 on L5.   Electronically Signed   By: Aletta Edouard M.D.   On: 07/08/2013 15:17     EKG Interpretation None      Date: 07/09/2013  Rate: 82  Rhythm: normal sinus rhythm  QRS Axis: normal  Intervals: normal  ST/T Wave abnormalities: normal  Conduction Disutrbances:none  Narrative Interpretation:   Old EKG Reviewed: unchanged  CRITICAL CARE Performed by: Osvaldo Shipper   Total critical care time: 30 minutes  Critical care time was exclusive of separately billable procedures and treating other patients.  Critical care was necessary to treat or prevent imminent or life-threatening deterioration.  Critical care was time spent  personally by me on the following activities: development of treatment plan with patient and/or surrogate as well as nursing, discussions with consultants, evaluation of patient's response to treatment, examination of patient, obtaining history from patient or surrogate, ordering and performing treatments and interventions, ordering and review of laboratory studies, ordering and review of radiographic studies, pulse oximetry and re-evaluation of patient's condition.   MDM   Final diagnoses:  Sepsis  Acute renal failure  Dehydration  Bronchitis  Hypoxia    32M presents with weakness. Seen by PCP today - diagnosed with UTI - given IM Rocephin. Increased weakness today. Satting 79% on room air, satting in the low 90s with 2 L. BP here 80s/40s-50s. Sepsis protocol initiated. Blood cultures drawn, urine cultures obtained. Rocephin/Azithro given for CAP coverage - lives at home.  Lungs with no specific findings, no hx of clots, doubt PE. History seems more suspicious for indolent infectious process. CMP shows ARF. White count of 23.8. CXR with bronchitis. After 2 L, still hypotensive with BPs in the 80s. Bedside US by me with collapsible IVC, no large pericardial effusion. I spoke with Dr. Titus Mould of [place] - with reassuring lactate (<2), ARF - likely hypovolemic - does not need ICU. Dr. Humphrey Rolls with the hospitalist service consulted and will see patient and admit to [facility] unit.    Osvaldo Shipper, MD 07/09/13 [phone]

## 2013-07-08 NOTE — ED Notes (Signed)
2nd RN to attempt labs

## 2013-07-08 NOTE — ED Notes (Signed)
Pt. Placed on 2 liter oxygen. Pt. Oxygen level 80%. Notified RN, [place]. Pt. Blood pressure rechecked 82/43.

## 2013-07-08 NOTE — Progress Notes (Addendum)
Chief Complaint:  Chief Complaint  Patient presents with  . Fall  . Fatigue    HPI: [name] is a 80 y.o. male who is here for sxs of fatigue and fall which started about 2 weeks ago while visiting his son Harrington Challenger in [place], [place] He was feeling well prior to his trip  But came in for an office visit prior to his trip on 06/17/2013, at that [place] he was found to have a UTI that grew out E coli with low-medium sensitivity to bactrim.  He was given Bactrim during that OV and he went to [place]. He states that from the beginning of the trip staring on 06/24/13 he was not feeling himself. . He had  To wear depends while he was there due to increase  Urinary frequency, he does not normally wear depends pads. Additionally he had poor PO intake, he has lost weight because he would be hungry but would not have an appetite. He denied any diarrhea , nausea, vomiting, Abd pain, CP or SOB, or calf pain.  HE normally is very active and exercises so he asked his sone to take him to a gym so he can walk, he went to the gym and fell after he got out of the shower because of the wet floor. He is not on any blood thinner. He did not have any prodrome sxs prior to fall ie CP, dizziness, etc. . He states he did not hit his his head , this was a witnessed event, he had no LOC, had has not had any confusion. He fell on his back, he had minimal back pain, he has been walking and there is no neck, hip , knee, or leg pain. He has minimal back pain.  He denies any LOC, cofusion. HE has not had any fevers, chills, nausea. He denies any numbness or tingling. No CP or SOB or palptiations.    Additionally he has been using a walker to help him walk. He flew home yesterday. Prior to today whil in [place], He did not have any energy, no pep, no appetitie.  Today was the first day he had a bowel of cereal and able to keep it down, as a side not he recently had EGD and was found to have hiatal  henria per patient      3wk ago   Culture ESCHERICHIA COLI    Colony Count >=100,000 COLONIES/ML    Organism ID, Bacteria ESCHERICHIA COLI    Resulting Agency [facility]   Culture & Susceptibility    Antibiotic  Organism Organism Organism     ESCHERICHIA COLI     AMOX/CLAVULANIC  4 S Final       AMPICILLIN  4 S Final       AMPICILLIN/SULBACTAM  <=2 S Final       CEFAZOLIN  <=4 S Final       CEFEPIME  <=1 S Final       CEFTAZIDIME  <=1 S Final       CEFTRIAXONE  <=1 S Final       CIPROFLOXACIN  <=0.25 S Final       GENTAMICIN  <=1 S Final       IMIPENEM  <=0.25 S Final       LEVOFLOXACIN  <=0.12 S Final       NITROFURANTOIN  32 S Final       PIP/TAZO  <=4  S Final       TOBRAMYCIN  <=1 S Final       TRIMETH/SULFA  <=20 S Final                    Past Medical History  Diagnosis Date  . BARRETTS ESOPHAGUS 11/19/2007  . CARDIOMYOPATHY 08/02/2008  . CHANGE IN BOWELS 02/01/2009  . CHF 08/10/2008  . Diarrhea 01/31/2009  . DIVERTICULOSIS, COLON 11/19/2007  . GASTRITIS 11/19/2007  . GERD 11/19/2007  . HIATAL HERNIA 11/19/2007  . HYPERLIPIDEMIA 06/27/2009  . HYPOTHYROIDISM 06/27/2009  . MELENA 08/01/2009  . PERSONAL HX COLONIC POLYPS 02/01/2009  . TINEA CORPORIS 06/27/2009  . TOBACCO USE, QUIT 06/27/2009  . TREMOR, ESSENTIAL 06/27/2009  . HTN (hypertension)   . IBS (irritable bowel syndrome)   . Heart murmur   . Blood transfusion without reported diagnosis    Past Surgical History  Procedure Laterality Date  . Cholecystectomy    . Lung surgery  1984  . Umbilical hernia repair    . Tonsillectomy    . Vasectomy    . Esophagogastroduodenoscopy N/A 04/28/2013    Procedure: ESOPHAGOGASTRODUODENOSCOPY (EGD);  Surgeon: Jerene Bears, MD;  Location: [facility];  Service: Gastroenterology;  Laterality: N/A;   History   Social History  . Marital Status: Widowed    Spouse Name: N/A    Number of Children: N/A  . Years of Education: N/A   Occupational History  . retired Clinical biochemist     Social History Main Topics  . Smoking status: Former Smoker -- 3.00 packs/day for 24 years    Types: Cigarettes    Quit date: 03/27/1943  . Smokeless tobacco: Never Used     Comment: Widowed x 2, has lady friend  . Alcohol Use: No     Comment: occassional  . Drug Use: No  . Sexual Activity: No   Other Topics Concern  . None   Social History Narrative  . None   Family History  Problem Relation Age of Onset  . Heart disease Mother   . Colon cancer Father   . Prostate cancer Brother    Allergies  Allergen Reactions  . Ciprofloxacin     Questionable allergy as pt reports taking med without difficulty for resp infection  . Penicillins Rash   Prior to Admission medications   Medication Sig Start Date End Date Taking? Authorizing Provider  esomeprazole (NEXIUM) 40 MG capsule Take 1 capsule (40 mg total) by mouth daily at 12 noon. 05/28/13   Jerene Bears, MD  HYDROcodone-acetaminophen (HYCET) 7.5-325 mg/15 ml solution Take 5 mLs by mouth every 6 (six) hours as needed (or cough). 04/13/13   Orma Flaming, MD  lisinopril-hydrochlorothiazide (PRINZIDE,ZESTORETIC) 10-12.5 MG per tablet Take 1 tablet by mouth daily with breakfast. 05/27/13   Fay Records, MD  metoprolol succinate (TOPROL-XL) 25 MG 24 hr tablet Take 1 tablet (25 mg total) by mouth daily with breakfast. 05/27/13   Fay Records, MD  Multiple Vitamins-Minerals (PRESERVISION AREDS 2 PO) Take 1 tablet by mouth 2 (two) times daily. 1 TAB TWICE A DAY    Historical Provider, MD  Probiotic Product (ALIGN PO) Take 1 tablet by mouth daily.     Historical Provider, MD  simvastatin (ZOCOR) 20 MG tablet Take 1 tablet (20 mg total) by mouth daily. 05/27/13   Fay Records, MD  sulfamethoxazole-trimethoprim (BACTRIM DS,SEPTRA DS) 800-160 MG per tablet Take 1 tablet by mouth 2 (two) times daily. 06/17/13  Posey Boyer, MD  Tamsulosin HCl (FLOMAX) 0.4 MG CAPS Take 0.4 mg by mouth daily.     Historical Provider, MD     ROS: The patient denies  fevers, chills, night sweats, unintentional weight loss, chest pain, palpitations, wheezing, dyspnea on exertion, nausea, vomiting, abdominal pain, dysuria, hematuria, melena, numbness,  or tingling.   All other systems have been reviewed and were otherwise negative with the exception of those mentioned in the HPI and as above.    PHYSICAL EXAM: Filed Vitals:   07/08/13 1157  BP: 132/60  Pulse: 68  Temp: 97.2 F (36.2 C)  Resp: 16  spo2 93% Filed Vitals:   07/08/13 1157  Height: 5' 5.25" (1.657 m)  Weight: 145 lb (65.772 kg)   Body mass index is 23.95 kg/(m^2).  General: Alert, no acute distress, tired HEENT:  Normocephalic, atraumatic, oropharynx patent. EOMI, PERRLA, tm nl, oral mucosa dry Cardiovascular:  Regular rate and rhythm, no rubs murmurs or gallops.  No Carotid bruits, radial pulse intact. No pedal edema.  Respiratory: Clear to auscultation bilaterally.  No wheezes, rales, or rhonchi.  No cyanosis, no use of accessory musculature GI: No organomegaly, abdomen is soft and non-tender, positive bowel sounds.  No masses. Skin: No rashes. Neurologic: Facial musculature symmetric. Psychiatric: Patient is appropriate throughout our interaction. Lymphatic: No cervical lymphadenopathy Musculoskeletal: Gait slowed, 5/5 strength   LABS: Results for orders placed in visit on 07/08/13  POCT UA - MICROSCOPIC ONLY      Result Value Ref Range   WBC, Ur, HPF, POC 0-3     RBC, urine, microscopic neg     Bacteria, U Microscopic neg     Mucus, UA neg     Epithelial cells, urine per micros 0-2     Crystals, Ur, HPF, POC neg     Casts, Ur, LPF, POC neg     Yeast, UA neg    POCT URINALYSIS DIPSTICK      Result Value Ref Range   Color, UA yellow     Clarity, UA clear     Glucose, UA neg     Bilirubin, UA neg     Ketones, UA neg     Spec Grav, UA 1.010     Blood, UA neg     pH, UA 5.5     Protein, UA neg     Urobilinogen, UA 0.2     Nitrite, UA neg     Leukocytes, UA Trace     POCT CBC      Result Value Ref Range   WBC 15.5 (*) 4.6 - 10.2 K/uL   Lymph, poc 2.4  0.6 - 3.4   POC LYMPH PERCENT 15.4  10 - 50 %L   MID (cbc) 0.8  0 - 0.9   POC MID % 5.0  0 - 12 %M   POC Granulocyte 12.3 (*) 2 - 6.9   Granulocyte percent 79.6  37 - 80 %G   RBC 4.69  4.69 - 6.13 M/uL   Hemoglobin 13.7 (*) 14.1 - 18.1 g/dL   HCT, POC 41.7 (*) 43.5 - 53.7 %   MCV 89.0  80 - 97 fL   MCH, POC 29.2  27 - 31.2 pg   MCHC 32.9  31.8 - 35.4 g/dL   RDW, POC 14.7     Platelet Count, POC 650 (*) 142 - 424 K/uL   MPV 9.1  0 - 99.8 fL  GLUCOSE, POCT (MANUAL RESULT ENTRY)  Result Value Ref Range   POC Glucose 91  70 - 99 mg/dl     EKG/XRAY:   Primary read interpreted by Dr. Marin Comment at [facility]. Please comment if there is an acute T12 compression fracture  ASSESSMENT/PLAN: Encounter Diagnoses  Name Primary?  . Fatigue   . Dehydration   . Back pain   . History of UTI   . Weight loss   . Leukocytosis, unspecified   . Thrombocytosis   . UTI (urinary tract infection) Yes   Pleasant 80 y/o gentleman who is here for generalized fatigue, poor appetite and oral intake that has progressively gotten worse in the last 2-3 weeks. Prior to this he was in good general health and was active,  about 3 weeks ago he went to [place] and came in to see Dr Linna Darner for a general checkup prior to his trip because he wanted to make sure he was well for travel. He was found to have a UTI and was put on bactrim. He left for [place] to visit his son on April 1. He has  had increase frequency and poor PO intake and had to use depends pads which he still wears while he was there. He lost weight during the trip due to poor po intake. Early in the trip he wanted to walk for exercise and was taken to his son's gym and walked around the track and then went into the men;s locker room afterwards to take a shower and fell. He denies hitting his head, LOC, HAs, vision changes, or confusion. He had minimal back pain after the fall.    Etiology : UTI vs NPH vs altitude sickness ( today is the first day he has been able to eat and keep down a a whole bowel of cereal)    1. UTI-most likely etiology of his overall sxs, he was treated with Bactrim which had low sensitivity to E coli that grew his urine culture. We had a lengthy discussion about his PCN allergy and what actually happened. I gave him the choice to go to the ER but he decided that he would liek the rocephin 1 gram injection after riska nd benefits explained, ie 10 % cross reaction with PCN allergy which he does not ene remember or know he has. He tolerated Rocephin without complications. Rx Keflex 500 mg BID x 10 days. Return in 1 day for recheck  2. He was given IVF x 1 L for dehydration due to poor PO  3. Labs pending: CMP, urine cx, TSH  4. TSH was also drawn due to weight loss and h/o hypothryoid which he states he does not remember if he was on thyroid medication or if he was on it why he was taken off of it.   5. Thrombocytosis may be reactive  6. I have a low threshold to get a CT scan due to gait changes , increase frequency if patient still feels poorly for normal pressure hydrocephalus vs altitude sickness, he feels better at home Patient advise to go to ER prn  Gross sideeffects, risk and benefits, and alternatives of medications d/w patient. Patient is aware that all medications have potential sideeffects and we are unable to predict every sideeffect or drug-drug interaction that may occur.  Glenford Bayley, DO 07/08/2013 4:14 PM

## 2013-07-09 ENCOUNTER — Encounter (HOSPITAL_COMMUNITY): Payer: Self-pay | Admitting: Internal Medicine

## 2013-07-09 ENCOUNTER — Telehealth: Payer: Self-pay | Admitting: Family Medicine

## 2013-07-09 DIAGNOSIS — N179 Acute kidney failure, unspecified: Secondary | ICD-10-CM | POA: Diagnosis present

## 2013-07-09 DIAGNOSIS — A419 Sepsis, unspecified organism: Secondary | ICD-10-CM

## 2013-07-09 DIAGNOSIS — N182 Chronic kidney disease, stage 2 (mild): Secondary | ICD-10-CM | POA: Diagnosis present

## 2013-07-09 DIAGNOSIS — I5043 Acute on chronic combined systolic (congestive) and diastolic (congestive) heart failure: Secondary | ICD-10-CM

## 2013-07-09 DIAGNOSIS — J189 Pneumonia, unspecified organism: Secondary | ICD-10-CM

## 2013-07-09 DIAGNOSIS — R6521 Severe sepsis with septic shock: Secondary | ICD-10-CM

## 2013-07-09 DIAGNOSIS — E872 Acidosis, unspecified: Secondary | ICD-10-CM | POA: Diagnosis present

## 2013-07-09 LAB — LEGIONELLA ANTIGEN, URINE: LEGIONELLA ANTIGEN, URINE: NEGATIVE

## 2013-07-09 LAB — PROTIME-INR
INR: 1.21 (ref 0.00–1.49)
Prothrombin Time: 15 seconds (ref 11.6–15.2)

## 2013-07-09 LAB — COMPREHENSIVE METABOLIC PANEL
ALT: 11 U/L (ref 0–53)
AST: 12 U/L (ref 0–37)
Albumin: 2 g/dL — ABNORMAL LOW (ref 3.5–5.2)
Alkaline Phosphatase: 75 U/L (ref 39–117)
BUN: 69 mg/dL — ABNORMAL HIGH (ref 6–23)
CALCIUM: 7.6 mg/dL — AB (ref 8.4–10.5)
CO2: 17 meq/L — AB (ref 19–32)
CREATININE: 2.25 mg/dL — AB (ref 0.50–1.35)
Chloride: 105 mEq/L (ref 96–112)
GFR calc non Af Amer: 24 mL/min — ABNORMAL LOW (ref >90)
GFR, EST AFRICAN AMERICAN: 28 mL/min — AB (ref >90)
GLUCOSE: 107 mg/dL — AB (ref 70–99)
Potassium: 3.7 mEq/L (ref 3.7–5.3)
Sodium: 135 mEq/L — ABNORMAL LOW (ref 137–147)
TOTAL PROTEIN: 5.7 g/dL — AB (ref 6.0–8.3)
Total Bilirubin: 0.3 mg/dL (ref 0.3–1.2)

## 2013-07-09 LAB — CBC
HCT: 32.6 % — ABNORMAL LOW (ref 39.0–52.0)
HEMOGLOBIN: 11.3 g/dL — AB (ref 13.0–17.0)
MCH: 29 pg (ref 26.0–34.0)
MCHC: 34.7 g/dL (ref 30.0–36.0)
MCV: 83.8 fL (ref 78.0–100.0)
Platelets: 452 10*3/uL — ABNORMAL HIGH (ref 150–400)
RBC: 3.89 MIL/uL — AB (ref 4.22–5.81)
RDW: 14.6 % (ref 11.5–15.5)
WBC: 22.2 10*3/uL — ABNORMAL HIGH (ref 4.0–10.5)

## 2013-07-09 LAB — URINE CULTURE
Colony Count: NO GROWTH
Organism ID, Bacteria: NO GROWTH

## 2013-07-09 LAB — GLUCOSE, CAPILLARY: GLUCOSE-CAPILLARY: 93 mg/dL (ref 70–99)

## 2013-07-09 LAB — STREP PNEUMONIAE URINARY ANTIGEN: Strep Pneumo Urinary Antigen: NEGATIVE

## 2013-07-09 LAB — HIV ANTIBODY (ROUTINE TESTING W REFLEX): HIV: NONREACTIVE

## 2013-07-09 LAB — MRSA PCR SCREENING: MRSA by PCR: NEGATIVE

## 2013-07-09 MED ORDER — SODIUM CHLORIDE 0.9 % IV SOLN
INTRAVENOUS | Status: DC
  Administered 2013-07-09 – 2013-07-11 (×5): via INTRAVENOUS

## 2013-07-09 MED ORDER — ACETAMINOPHEN 650 MG RE SUPP: 650.0000 mg | Freq: Four times a day (QID) | RECTAL | Status: DC | PRN

## 2013-07-09 MED ORDER — PANTOPRAZOLE SODIUM 40 MG PO TBEC
80.0000 mg | DELAYED_RELEASE_TABLET | Freq: Every day | ORAL | Status: DC
  Administered 2013-07-09 – 2013-07-15 (×7): 80 mg via ORAL
  Filled 2013-07-09 (×7): qty 2

## 2013-07-09 MED ORDER — SODIUM CHLORIDE 0.9 % IJ SOLN
3.0000 mL | Freq: Two times a day (BID) | INTRAMUSCULAR | Status: DC
  Administered 2013-07-09 – 2013-07-15 (×14): 3 mL via INTRAVENOUS

## 2013-07-09 MED ORDER — ASPIRIN EC 81 MG PO TBEC
81.0000 mg | DELAYED_RELEASE_TABLET | Freq: Every day | ORAL | Status: DC
  Administered 2013-07-09 – 2013-07-15 (×7): 81 mg via ORAL
  Filled 2013-07-09 (×7): qty 1

## 2013-07-09 MED ORDER — HEPARIN SODIUM (PORCINE) 5000 UNIT/ML IJ SOLN
5000.0000 [IU] | Freq: Three times a day (TID) | INTRAMUSCULAR | Status: DC
  Administered 2013-07-09 – 2013-07-15 (×19): 5000 [IU] via SUBCUTANEOUS
  Filled 2013-07-09 (×22): qty 1

## 2013-07-09 MED ORDER — MAGNESIUM CITRATE PO SOLN: 1.0000 | Freq: Once | ORAL | Status: AC | PRN

## 2013-07-09 MED ORDER — FLORA-Q PO CAPS
1.0000 | ORAL_CAPSULE | Freq: Every day | ORAL | Status: DC
  Administered 2013-07-09 – 2013-07-15 (×7): 1 via ORAL
  Filled 2013-07-09 (×8): qty 1

## 2013-07-09 MED ORDER — ADULT MULTIVITAMIN W/MINERALS CH
1.0000 | ORAL_TABLET | Freq: Every day | ORAL | Status: DC
  Administered 2013-07-09 – 2013-07-15 (×7): 1 via ORAL
  Filled 2013-07-09 (×7): qty 1

## 2013-07-09 MED ORDER — DOCUSATE SODIUM 100 MG PO CAPS
100.0000 mg | ORAL_CAPSULE | Freq: Two times a day (BID) | ORAL | Status: DC
  Administered 2013-07-09 – 2013-07-15 (×11): 100 mg via ORAL
  Filled 2013-07-09 (×14): qty 1

## 2013-07-09 MED ORDER — ONDANSETRON HCL 4 MG/2ML IJ SOLN: 4.0000 mg | Freq: Four times a day (QID) | INTRAMUSCULAR | Status: DC | PRN

## 2013-07-09 MED ORDER — ONDANSETRON HCL 4 MG PO TABS: 4.0000 mg | ORAL_TABLET | Freq: Four times a day (QID) | ORAL | Status: DC | PRN

## 2013-07-09 MED ORDER — SORBITOL 70 % SOLN
30.0000 mL | Freq: Every day | Status: DC | PRN
  Filled 2013-07-09: qty 30

## 2013-07-09 MED ORDER — FOLIC ACID 1 MG PO TABS
1.0000 mg | ORAL_TABLET | Freq: Every day | ORAL | Status: DC
  Administered 2013-07-09 – 2013-07-15 (×7): 1 mg via ORAL
  Filled 2013-07-09 (×8): qty 1

## 2013-07-09 MED ORDER — ALUM & MAG HYDROXIDE-SIMETH 200-200-20 MG/5ML PO SUSP: 30.0000 mL | Freq: Four times a day (QID) | ORAL | Status: DC | PRN

## 2013-07-09 MED ORDER — NOREPINEPHRINE BITARTRATE 1 MG/ML IJ SOLN
2.0000 ug/min | INTRAVENOUS | Status: DC
  Filled 2013-07-09: qty 4

## 2013-07-09 MED ORDER — DEXTROSE 5 % IV SOLN
1.0000 g | INTRAVENOUS | Status: DC
  Administered 2013-07-09 – 2013-07-11 (×3): 1 g via INTRAVENOUS
  Filled 2013-07-09 (×3): qty 10

## 2013-07-09 MED ORDER — SIMVASTATIN 20 MG PO TABS
20.0000 mg | ORAL_TABLET | Freq: Every day | ORAL | Status: DC
  Administered 2013-07-09 – 2013-07-14 (×6): 20 mg via ORAL
  Filled 2013-07-09 (×7): qty 1

## 2013-07-09 MED ORDER — ENSURE COMPLETE PO LIQD
237.0000 mL | Freq: Every day | ORAL | Status: DC
  Administered 2013-07-11 – 2013-07-14 (×4): 237 mL via ORAL

## 2013-07-09 MED ORDER — DEXTROSE 5 % IV SOLN
500.0000 mg | INTRAVENOUS | Status: DC
  Administered 2013-07-09 – 2013-07-11 (×3): 500 mg via INTRAVENOUS
  Filled 2013-07-09 (×5): qty 500

## 2013-07-09 MED ORDER — HYDROCODONE-ACETAMINOPHEN 5-325 MG PO TABS: 1.0000 | ORAL_TABLET | ORAL | Status: DC | PRN

## 2013-07-09 MED ORDER — ACETAMINOPHEN 325 MG PO TABS: 650.0000 mg | ORAL_TABLET | Freq: Four times a day (QID) | ORAL | Status: DC | PRN

## 2013-07-09 MED ORDER — VITAMIN B-1 100 MG PO TABS
100.0000 mg | ORAL_TABLET | Freq: Every day | ORAL | Status: DC
  Administered 2013-07-09 – 2013-07-15 (×7): 100 mg via ORAL
  Filled 2013-07-09 (×7): qty 1

## 2013-07-09 NOTE — Telephone Encounter (Signed)
Called to check on pt, he went to the ER. Spoke with son and daughter in [employer]

## 2013-07-09 NOTE — H&P (Addendum)
[facility] History and Physical  [name] UKG:[id number] DOB: 06-Jun-1921 DOA: 07/08/2013  Referring physician: Murlean Caller PCP: Kennon Portela, MD   Chief Complaint: Weakness  HPI: [name] is a 80 y.o. male presents to the ED with increased weakness and shortness of breath. Patient apparently was recently in [place] to visit his son. He states he came back to find that he was having less energy. Today he went to urgent care and was seen in urgent care and was felt to have a UTI. In addition he was noted to be hypotensive. He has now been given a total of 3 liters of fluid. Patient was also seen by his PCP on 3/25 for a UTI. This was prior to going on his trip to [place]. Presently he is a little confused and is not able to provide a full history. He has not been having any fevers has been having some cough and also some SOB. There is no chest pain noted. He has not had any palpitations noted. In the ED he was noted to have an elevated creatinine and in addition he has been noted to have a CXR showing some increased infiltrates.   Review of Systems:  Constitutional:  No weight loss, night sweats, Fevers, chills, fatigue.  HEENT:  No headaches, Sore throat,  No nasal congestion, post nasal drip,  Cardio-vascular:  No chest pain, Orthopnea, PND, swelling in lower extremities, dizziness, palpitations  GI:  No heartburn, indigestion, abdominal pain, nausea, vomiting, diarrhea, change in bowel habits, loss of appetite  Resp:  ++shortness of breath at rest. Denies hemoptysis.  Skin:  no rash or lesions.  GU:  ++ dysuria, No flank pain.  Musculoskeletal:  No joint pain or swelling. No decreased range of motion. No back pain.  Psych:  No change in mood or affect. No depression or anxiety. No memory loss.   Past Medical History  Diagnosis Date  . BARRETTS ESOPHAGUS 11/19/2007  . CARDIOMYOPATHY 08/02/2008  . CHANGE IN BOWELS 02/01/2009  . CHF 08/10/2008  .  Diarrhea 01/31/2009  . DIVERTICULOSIS, COLON 11/19/2007  . GASTRITIS 11/19/2007  . GERD 11/19/2007  . HIATAL HERNIA 11/19/2007  . HYPERLIPIDEMIA 06/27/2009  . HYPOTHYROIDISM 06/27/2009  . MELENA 08/01/2009  . PERSONAL HX COLONIC POLYPS 02/01/2009  . TINEA CORPORIS 06/27/2009  . TOBACCO USE, QUIT 06/27/2009  . TREMOR, ESSENTIAL 06/27/2009  . HTN (hypertension)   . IBS (irritable bowel syndrome)   . Heart murmur   . Blood transfusion without reported diagnosis    Past Surgical History  Procedure Laterality Date  . Cholecystectomy    . Lung surgery  1984  . Umbilical hernia repair    . Tonsillectomy    . Vasectomy    . Esophagogastroduodenoscopy N/A 04/28/2013    Procedure: ESOPHAGOGASTRODUODENOSCOPY (EGD);  Surgeon: Jerene Bears, MD;  Location: [facility];  Service: Gastroenterology;  Laterality: N/A;   Social History:  reports that he quit smoking about 70 years ago. His smoking use included Cigarettes. He has a 72 pack-year smoking history. He has never used smokeless tobacco. He reports that he does not drink alcohol or use illicit drugs.  Allergies  Allergen Reactions  . Ciprofloxacin     Questionable allergy as pt reports taking med without difficulty for resp infection  . Penicillins Rash    Family History  Problem Relation Age of Onset  . Heart disease Mother   . Colon cancer Father   . Prostate cancer Brother  Prior to Admission medications   Medication Sig Start Date End Date Taking? Authorizing Provider  esomeprazole (NEXIUM) 40 MG capsule Take 1 capsule (40 mg total) by mouth daily at 12 noon. 05/28/13  Yes Jerene Bears, MD  lisinopril-hydrochlorothiazide (PRINZIDE,ZESTORETIC) 10-12.5 MG per tablet Take 1 tablet by mouth daily with breakfast. 05/27/13  Yes Fay Records, MD  cephALEXin (KEFLEX) 500 MG capsule Take 1 capsule (500 mg total) by mouth 2 (two) times daily. 07/08/13   Thao P Le, DO  HYDROcodone-acetaminophen (HYCET) 7.5-325 mg/15 ml solution Take 5 mLs by mouth every 6  (six) hours as needed (or cough). 04/13/13   Orma Flaming, MD  metoprolol succinate (TOPROL-XL) 25 MG 24 hr tablet Take 1 tablet (25 mg total) by mouth daily with breakfast. 05/27/13   Fay Records, MD  Multiple Vitamins-Minerals (PRESERVISION AREDS 2 PO) Take 1 tablet by mouth 2 (two) times daily. 1 TAB TWICE A DAY    Historical Provider, MD  Probiotic Product (ALIGN PO) Take 1 tablet by mouth daily.     Historical Provider, MD  simvastatin (ZOCOR) 20 MG tablet Take 1 tablet (20 mg total) by mouth daily. 05/27/13   Fay Records, MD  Tamsulosin HCl (FLOMAX) 0.4 MG CAPS Take 0.4 mg by mouth daily.     Historical Provider, MD   Physical Exam: Filed Vitals:   07/09/13 0020  BP: 84/53  Pulse: 83  Temp:   Resp: 25    BP 84/53  Pulse 83  Temp(Src) 97.7 F (36.5 C) (Oral)  Resp 25  SpO2 93%  General:  Appears calm hard of hearing Eyes: PERRL, normal lids, irises & conjunctiva ENT: grossly normal hearing, lips & tongue are dry Neck: no LAD, masses or thyromegaly Cardiovascular: RRR, no m/r/g. No LE edema. Telemetry: SR, no arrhythmias  Respiratory: CTA bilaterally, Normal respiratory effort. Abdomen: soft, ntnd Skin: no rash or induration seen on limited exam Musculoskeletal: grossly normal tone BUE/BLE Psychiatric: grossly normal Neurologic: grossly non-focal.          Labs on Admission:  Basic Metabolic Panel:  Recent Labs Lab 07/08/13 1405  NA 131*  K 4.7  CL 97  CO2 18*  GLUCOSE 78  BUN 85*  CREATININE 3.11*  CALCIUM 8.9   Liver Function Tests:  Recent Labs Lab 07/08/13 1405  AST 18  ALT 17  ALKPHOS 95  BILITOT 0.5  PROT 6.8  ALBUMIN 3.3*   No results found for this basename: LIPASE, AMYLASE,  in the last 168 hours No results found for this basename: AMMONIA,  in the last 168 hours CBC:  Recent Labs Lab 07/08/13 1407 07/08/13 2222  WBC 15.5* 23.8*  HGB 13.7* 10.6*  HCT 41.7* 30.2*  MCV 89.0 84.6  PLT  --  429*   Cardiac Enzymes: No results found  for this basename: CKTOTAL, CKMB, CKMBINDEX, TROPONINI,  in the last 168 hours  BNP (last 3 results)  Recent Labs  07/08/13 2222  PROBNP 3764.0*   CBG: No results found for this basename: GLUCAP,  in the last 168 hours  Radiological Exams on Admission: Dg Thoracic Spine 2 View  07/08/2013   CLINICAL DATA:  [place] on wet floor getting out of shower 3 weeks ago, back pain  EXAM: THORACIC SPINE - 2 VIEW  COMPARISON:  Chest radiographs 04/13/2013  FINDINGS: Twelve pairs of ribs.  Diffuse osseous demineralization.  Biconvex thoracolumbar scoliosis, mild.  Scattered disc space narrowing.  Superior endplate compression fracture of T7 vertebra,  less than 50% height loss, stable.  Superior endplate compression fracture T12, approximately 50% height loss, stable.  No additional fracture or subluxation.  Atherosclerotic calcification aorta.  Extensive calcified pleural plaque formation in both hemithoraces.  IMPRESSION: Stable superior endplate compression fractures of T7 and T12.  Osseous demineralization with biconvex thoracolumbar scoliosis.  No definite acute osseous abnormalities.   Electronically Signed   By: Lavonia Dana M.D.   On: 07/08/2013 14:21   Dg Lumbar Spine 2-3 Views  07/08/2013   CLINICAL DATA:  Low back pain.  EXAM: LUMBAR SPINE - 2-3 VIEW  COMPARISON:  DG THORACIC SPINE 2V dated 07/08/2013  FINDINGS: The lumbar spine demonstrates osteopenia. Compression fracture of T12 was noted on the thoracic plain films. There is mild leftward convex curvature of the lumbar spine. Mild disc space narrowing at L4-5 with associated anterolisthesis of L4 on L5 of approximately 7 mm. No bony lesions are seen. Atherosclerotic calcifications are identified in the abdominal aorta and iliac arteries.  IMPRESSION: Spondylosis at L4-5 with 7 mm anterolisthesis of L4 on L5.   Electronically Signed   By: Aletta Edouard M.D.   On: 07/08/2013 15:17   Dg Chest Port 1 View  07/08/2013   CLINICAL DATA:  Weakness,  shortness of breath  EXAM: PORTABLE CHEST - 1 VIEW  COMPARISON:  DG THORACIC SPINE 2V dated 07/08/2013; DG CHEST 2V dated 02/04/2012; DG CHEST 2V dated 04/13/2013  FINDINGS: Bilateral diffuse interstitial thickening and peribronchial cuffing most concerning for bronchitis. There is no pleural effusion or pneumothorax. Stable cardiomediastinal silhouette. Aortic atherosclerosis is noted.  The osseous structures are unremarkable.  IMPRESSION: Bilateral diffuse interstitial thickening and peribronchial cuffing most concerning for bronchitis.   Electronically Signed   By: Kathreen Devoid   On: 07/08/2013 23:02       Assessment/Plan Principal Problem:   Pneumonia Active Problems:   HYPERTENSION   CARDIOMYOPATHY   1. Pneumonia -will admit for IV antibiotics to stepdown unit -oxygen therapy -blood cultures have been drawn  2. Hypotension/Sepsis -patient overall appears to be dehydrated -will give IV fluids NS at 100 ml/hr -will start on pressors if he does not respond to fluids  3. Cardiomyopathy -he does have a history of CHF and so will need to watch closely for decompensation  4. Acute renal Failure -likely dehydrated and also had been having urinary symptoms prior to admission -on appropriate antibiotics -will continue with hydration   Code Status: Full code (must indicate code status--if unknown or must be presumed, indicate so) Family Communication: No family (indicate person spoken with, if applicable, with phone number if by telephone) Disposition Plan: Home (indicate anticipated LOS)  Time spent: 58min  Saadat A Khan [facility] Pager [phone]

## 2013-07-09 NOTE — Progress Notes (Signed)
INITIAL NUTRITION ASSESSMENT  Pt meets criteria for severe MALNUTRITION in the context of acute illness as evidenced by <50% estimated energy intake with 7% weight loss in the past 2 weeks per pt report.  DOCUMENTATION CODES Per approved criteria  -Severe malnutrition in the context of acute illness or injury   INTERVENTION: - Recommend MD monitor pt's potassium, phosphorus, and magnesium as PO intake improves as pt at high risk of refeeding syndrome r/t basically no PO intake x 2 weeks per pt report - Ensure Complete once daily - Encouraged increased meal intake - Will continue to monitor   NUTRITION DIAGNOSIS: Unintended weight loss related to not feeling well, not eating much as evidenced by pt report.    Goal: Pt to consume >90% of meals/supplements  Monitor:  Weights, labs, intake  Reason for Assessment: Consult for assessment   80 y.o. male  Admitting Dx: Septic shock  ASSESSMENT: Pt presents to the ED with increased weakness and shortness of breath. Patient apparently was recently in [place] to visit his son. He states he came back to find that he was having less energy. Today he went to urgent care and was seen in urgent care and was felt to have a UTI. In addition he was noted to be hypotensive.   Met with pt who reports minimal intake in the past 2 weeks with 12 pound unintended weight loss. Wasn't eating much of anything other than a few bites of oatmeal. Ate a good breakfast this morning. Was eating some chocolate ice cream so unable to perform nutrition focused physical exam.   Getting thiamine, folic acid, and multivitamin with minerals  Height: Ht Readings from Last 1 Encounters:  07/09/13 $RemoveB'5\' 9"'fmCdpZMT$  (1.753 m)    Weight: Wt Readings from Last 1 Encounters:  07/09/13 151 lb 10.8 oz (68.8 kg)    Ideal Body Weight: 160 lbs   % Ideal Body Weight: 94%  Wt Readings from Last 10 Encounters:  07/09/13 151 lb 10.8 oz (68.8 kg)  07/08/13 145 lb (65.772 kg)   06/17/13 162 lb 3.2 oz (73.573 kg)  05/22/13 162 lb (73.483 kg)  05/18/13 158 lb (71.668 kg)  04/13/13 160 lb (72.576 kg)  04/13/13 160 lb (72.576 kg)  04/13/13 158 lb (71.668 kg)  03/25/13 161 lb 2 oz (73.086 kg)  02/09/13 156 lb (70.761 kg)    Usual Body Weight: 163 lbs   % Usual Body Weight: 93%  BMI:  Body mass index is 22.39 kg/(m^2).  Estimated Nutritional Needs: Kcal: 4782-9562 Protein: 85-100g Fluid: 1.7-1.9L/day   Skin: Non-pitting RLE, LLE edema  Diet Order: Carb Control  EDUCATION NEEDS: -No education needs identified at this time   Intake/Output Summary (Last 24 hours) at 07/09/13 1637 Last data filed at 07/09/13 1600  Gross per 24 hour  Intake 1310.08 ml  Output    975 ml  Net 335.08 ml    Last BM: 4/15  Labs:   Recent Labs Lab 07/08/13 1405 07/09/13 0309  NA 131* 135*  K 4.7 3.7  CL 97 105  CO2 18* 17*  BUN 85* 69*  CREATININE 3.11* 2.25*  CALCIUM 8.9 7.6*  GLUCOSE 78 107*    CBG (last 3)   Recent Labs  07/09/13 0805  GLUCAP 93    Scheduled Meds: . aspirin EC  81 mg Oral Daily  . azithromycin  500 mg Intravenous Q24H  . cefTRIAXone (ROCEPHIN)  IV  1 g Intravenous Q24H  . docusate sodium  100 mg Oral BID  .  FLORA-Q  1 capsule Oral Daily  . folic acid  1 mg Oral Daily  . heparin  5,000 Units Subcutaneous 3 times per day  . multivitamin with minerals  1 tablet Oral Daily  . pantoprazole  80 mg Oral Q1200  . simvastatin  20 mg Oral q1800  . sodium chloride  3 mL Intravenous Q12H  . thiamine  100 mg Oral Daily    Continuous Infusions: . sodium chloride 75 mL/hr at 07/09/13 1017  . norepinephrine (LEVOPHED) Adult infusion Stopped (07/09/13 0115)    Past Medical History  Diagnosis Date  . BARRETTS ESOPHAGUS 11/19/2007  . CARDIOMYOPATHY 08/02/2008  . CHANGE IN BOWELS 02/01/2009  . CHF 08/10/2008  . Diarrhea 01/31/2009  . DIVERTICULOSIS, COLON 11/19/2007  . GASTRITIS 11/19/2007  . GERD 11/19/2007  . HIATAL HERNIA 11/19/2007   . HYPERLIPIDEMIA 06/27/2009  . HYPOTHYROIDISM 06/27/2009  . MELENA 08/01/2009  . PERSONAL HX COLONIC POLYPS 02/01/2009  . TINEA CORPORIS 06/27/2009  . TOBACCO USE, QUIT 06/27/2009  . TREMOR, ESSENTIAL 06/27/2009  . HTN (hypertension)   . IBS (irritable bowel syndrome)   . Heart murmur   . Blood transfusion without reported diagnosis     Past Surgical History  Procedure Laterality Date  . Cholecystectomy    . Lung surgery  1984  . Umbilical hernia repair    . Tonsillectomy    . Vasectomy    . Esophagogastroduodenoscopy N/A 04/28/2013    Procedure: ESOPHAGOGASTRODUODENOSCOPY (EGD);  Surgeon: Jerene Bears, MD;  Location: [facility];  Service: Gastroenterology;  Laterality: N/A;  . Tonsillectomy    . Hernia repair      Mikey College MS, RD, LDN [phone] Pager [phone] After Hours Pager

## 2013-07-09 NOTE — ED Notes (Signed)
Attempted to call report to floor.  RN will call back.

## 2013-07-09 NOTE — Progress Notes (Signed)
CARE MANAGEMENT NOTE 07/09/2013  Patient:  FOUNTAIN, DERUSHA   Account Number:  [id number]  Date Initiated:  07/09/2013  Documentation initiated by:  Jamaurie Bernier  Subjective/Objective Assessment:   uti, pna [place]  fell x3 days ago-thoracic compression  fractures     Action/Plan:   from home   Anticipated DC Date:  07/12/2013   Anticipated DC Plan:  HOME/SELF CARE  In-house referral  NA      DC Planning Services  NA      [facility] Choice  NA   Choice offered to / List presented to:  NA   DME arranged  NA      DME agency  NA     [place] arranged  NA      [place] agency  NA   Status of service:  In process, will continue to follow Medicare Important Message given?  NA - LOS <3 / Initial given by admissions (If response is "NO", the following Medicare IM given date fields will be blank) Date Medicare IM given:   Date Additional Medicare IM given:    Discharge Disposition:    Per UR Regulation:  Reviewed for med. necessity/level of care/duration of stay  If discussed at [place] of Stay Meetings, dates discussed:    Comments:  [id number]/Shavana Calder Eldridge Dace, [place], [place] [phone] Chart Reviewed for discharge and hospital needs. Discharge needs at time of review: None present will follow for needs. Review of patient progress due on [id number].

## 2013-07-09 NOTE — Progress Notes (Addendum)
[facility] PROGRESS NOTE  [name] VFI:[id number] DOB: 1922-01-18 DOA: 07/08/2013 PCP: Kennon Portela, MD  Assessment/Plan: Principal Problem:   Septic shock: Patient needs criteria given underlying infection of pneumonia, fever, hypotension requiring aggressive fluid support. Patient doing much better. Noted persistent elevated white blood cell count. Fortunately did not require pressor support  Active Problems:   HYPERLIPIDEMIA: On Zocor.  BPH: Flomax held.    HYPERTENSION: Currently hypotensive. Continue IV fluids and holding antihypertensives    Acute on chronic combined systolic and diastolic heart failure: In review previous echocardiogram, decreased ejection fraction with diastolic failure. BNP would be unrevealing at this point given acutely elevated creatinine plus need for persistent aggressive IV hydration. We'll recheck once stabilized.    CAP (community acquired pneumonia): See above. Continue IV antibiotics. Patient's oxygen saturations are stable on 2 L nasal cannula.    ARF (acute renal failure) in the setting of  CKD (chronic kidney disease) stage 2, GFR 60-89 ml/min: Continue IV fluids.   Metabolic acidosis: Secondary to sepsis. Improving anion gap near normal today.  Protein calorie malnutrition: Noted low albumin. We'll have nutrition consulted  Code Status: Full Family Communication: Spoke with son by phone today Disposition Plan: Continue step down until blood pressure stabilizes   Consultants:  None  Procedures:  None  Antibiotics:  IV Rocephin 4/15-present  IV Zithromax 4/15-present  HPI/Subjective: Patient doing okay. A little bit better than yesterday. Breathing little bit easier.  Objective: Filed Vitals:   07/09/13 1015  BP: 99/41  Pulse: 70  Temp:   Resp: 23    Intake/Output Summary (Last 24 hours) at 07/09/13 1212 Last data filed at 07/09/13 1100  Gross per 24 hour  Intake    853 ml  Output    675 ml  Net     178 ml   Filed Weights   07/09/13 0110 07/09/13 0700  Weight: 68.9 kg (151 lb 14.4 oz) 68.8 kg (151 lb 10.8 oz)    Exam:   General:  Alert and oriented x3, no acute distress  Cardiovascular: Regular rate and rhythm, S1-S2  Respiratory: Decreased breath sounds bibasilar  Abdomen: Soft, nontender, nondistended, hypoactive bowel sounds  Musculoskeletal: No clubbing or cyanosis, trace pitting edema   Data Reviewed: Basic Metabolic Panel:  Recent Labs Lab 07/08/13 1405 07/09/13 0309  NA 131* 135*  Parker 4.7 3.7  CL 97 105  CO2 18* 17*  GLUCOSE 78 107*  BUN 85* 69*  CREATININE 3.11* 2.25*  CALCIUM 8.9 7.6*   Liver Function Tests:  Recent Labs Lab 07/08/13 1405 07/09/13 0309  AST 18 12  ALT 17 11  ALKPHOS 95 75  BILITOT 0.5 0.3  PROT 6.8 5.7*  ALBUMIN 3.3* 2.0*   No results found for this basename: LIPASE, AMYLASE,  in the last 168 hours No results found for this basename: AMMONIA,  in the last 168 hours CBC:  Recent Labs Lab 07/08/13 1407 07/08/13 2222 07/09/13 0309  WBC 15.5* 23.8* 22.2*  HGB 13.7* 10.6* 11.3*  HCT 41.7* 30.2* 32.6*  MCV 89.0 84.6 83.8  PLT  --  429* 452*   Cardiac Enzymes: No results found for this basename: CKTOTAL, CKMB, CKMBINDEX, TROPONINI,  in the last 168 hours BNP (last 3 results)  Recent Labs  07/08/13 2222  PROBNP 3764.0*   CBG:  Recent Labs Lab 07/09/13 0805  GLUCAP 93    Recent Results (from the past 240 hour(s))  MRSA PCR SCREENING     Status: None  Collection Time    07/09/13  1:11 AM      Result Value Ref Range Status   MRSA by PCR NEGATIVE  NEGATIVE Final   Comment:            The GeneXpert MRSA Assay (FDA     approved for NASAL specimens     only), is one component of a     comprehensive MRSA colonization     surveillance program. It is not     intended to diagnose MRSA     infection nor to guide or     monitor treatment for     MRSA infections.     Studies: Dg Thoracic Spine 2  View  07/08/2013   CLINICAL DATA:  [address] on wet floor getting out of shower 3 weeks ago, back pain  EXAM: THORACIC SPINE - 2 VIEW  COMPARISON:  Chest radiographs 04/13/2013  FINDINGS: Twelve pairs of ribs.  Diffuse osseous demineralization.  Biconvex thoracolumbar scoliosis, mild.  Scattered disc space narrowing.  Superior endplate compression fracture of T7 vertebra, less than 50% height loss, stable.  Superior endplate compression fracture T12, approximately 50% height loss, stable.  No additional fracture or subluxation.  Atherosclerotic calcification aorta.  Extensive calcified pleural plaque formation in both hemithoraces.  IMPRESSION: Stable superior endplate compression fractures of T7 and T12.  Osseous demineralization with biconvex thoracolumbar scoliosis.  No definite acute osseous abnormalities.   Electronically Signed   By: Lavonia Dana M.D.   On: 07/08/2013 14:21   Dg Lumbar Spine 2-3 Views  07/08/2013   CLINICAL DATA:  Low back pain.  EXAM: LUMBAR SPINE - 2-3 VIEW  COMPARISON:  DG THORACIC SPINE 2V dated 07/08/2013  FINDINGS: The lumbar spine demonstrates osteopenia. Compression fracture of T12 was noted on the thoracic plain films. There is mild leftward convex curvature of the lumbar spine. Mild disc space narrowing at L4-5 with associated anterolisthesis of L4 on L5 of approximately 7 mm. No bony lesions are seen. Atherosclerotic calcifications are identified in the abdominal aorta and iliac arteries.  IMPRESSION: Spondylosis at L4-5 with 7 mm anterolisthesis of L4 on L5.   Electronically Signed   By: Aletta Edouard M.D.   On: 07/08/2013 15:17   Dg Chest Port 1 View  07/08/2013   CLINICAL DATA:  Weakness, shortness of breath  EXAM: PORTABLE CHEST - 1 VIEW  COMPARISON:  DG THORACIC SPINE 2V dated 07/08/2013; DG CHEST 2V dated 02/04/2012; DG CHEST 2V dated 04/13/2013  FINDINGS: Bilateral diffuse interstitial thickening and peribronchial cuffing most concerning for bronchitis. There is no pleural  effusion or pneumothorax. Stable cardiomediastinal silhouette. Aortic atherosclerosis is noted.  The osseous structures are unremarkable.  IMPRESSION: Bilateral diffuse interstitial thickening and peribronchial cuffing most concerning for bronchitis.   Electronically Signed   By: Kathreen Devoid   On: 07/08/2013 23:02    Scheduled Meds: . aspirin EC  81 mg Oral Daily  . azithromycin  500 mg Intravenous Q24H  . cefTRIAXone (ROCEPHIN)  IV  1 g Intravenous Q24H  . docusate sodium  100 mg Oral BID  . FLORA-Q  1 capsule Oral Daily  . folic acid  1 mg Oral Daily  . heparin  5,000 Units Subcutaneous 3 times per day  . multivitamin with minerals  1 tablet Oral Daily  . pantoprazole  80 mg Oral Q1200  . simvastatin  20 mg Oral q1800  . sodium chloride  3 mL Intravenous Q12H  . thiamine  100 mg Oral Daily  Continuous Infusions: . sodium chloride 75 mL/hr at 07/09/13 1017  . norepinephrine (LEVOPHED) Adult infusion Stopped (07/09/13 0115)    Principal Problem:   Septic shock Active Problems:   HYPERLIPIDEMIA   HYPERTENSION   Acute on chronic combined systolic and diastolic heart failure   CAP (community acquired pneumonia)   ARF (acute renal failure)   CKD (chronic kidney disease) stage 2, GFR 61-60 ml/min   Metabolic acidosis    Time spent: 35 minutes    Larkin Morelos [facility] [employer] [facility] Pager [phone]. If 7PM-7AM, please contact night-coverage at [email or web address], password [facility] 07/09/2013, 12:12 PM  LOS: 1 day

## 2013-07-10 DIAGNOSIS — G9340 Encephalopathy, unspecified: Secondary | ICD-10-CM | POA: Diagnosis present

## 2013-07-10 DIAGNOSIS — E43 Unspecified severe protein-calorie malnutrition: Secondary | ICD-10-CM | POA: Diagnosis present

## 2013-07-10 LAB — BASIC METABOLIC PANEL
BUN: 42 mg/dL — ABNORMAL HIGH (ref 6–23)
CHLORIDE: 107 meq/L (ref 96–112)
CO2: 19 mEq/L (ref 19–32)
Calcium: 7.6 mg/dL — ABNORMAL LOW (ref 8.4–10.5)
Creatinine, Ser: 1.55 mg/dL — ABNORMAL HIGH (ref 0.50–1.35)
GFR calc Af Amer: 44 mL/min — ABNORMAL LOW (ref >90)
GFR calc non Af Amer: 38 mL/min — ABNORMAL LOW (ref >90)
Glucose, Bld: 101 mg/dL — ABNORMAL HIGH (ref 70–99)
Potassium: 3.7 mEq/L (ref 3.7–5.3)
Sodium: 135 mEq/L — ABNORMAL LOW (ref 137–147)

## 2013-07-10 LAB — CBC
HEMATOCRIT: 30 % — AB (ref 39.0–52.0)
Hemoglobin: 10 g/dL — ABNORMAL LOW (ref 13.0–17.0)
MCH: 29.2 pg (ref 26.0–34.0)
MCHC: 33.3 g/dL (ref 30.0–36.0)
MCV: 87.7 fL (ref 78.0–100.0)
PLATELETS: 357 10*3/uL (ref 150–400)
RBC: 3.42 MIL/uL — ABNORMAL LOW (ref 4.22–5.81)
RDW: 14.9 % (ref 11.5–15.5)
WBC: 14.8 10*3/uL — AB (ref 4.0–10.5)

## 2013-07-10 LAB — GLUCOSE, CAPILLARY: GLUCOSE-CAPILLARY: 100 mg/dL — AB (ref 70–99)

## 2013-07-10 LAB — URINE CULTURE
COLONY COUNT: NO GROWTH
CULTURE: NO GROWTH

## 2013-07-10 LAB — HEMOGLOBIN A1C
Hgb A1c MFr Bld: 6.4 % — ABNORMAL HIGH (ref <5.7)
Mean Plasma Glucose: 137 mg/dL — ABNORMAL HIGH (ref <117)

## 2013-07-10 MED ORDER — ALBUTEROL SULFATE (2.5 MG/3ML) 0.083% IN NEBU
2.5000 mg | INHALATION_SOLUTION | RESPIRATORY_TRACT | Status: DC | PRN
  Administered 2013-07-10: 2.5 mg via RESPIRATORY_TRACT
  Filled 2013-07-10: qty 3

## 2013-07-10 NOTE — Progress Notes (Signed)
Pt complaining of rapid heart rate that woke him up. Appears that pt has gone into a fib with RVR. NT to obtain 12 lead and a set of vitals. RN following will contact NP on call and manage pt care from here.

## 2013-07-10 NOTE — Progress Notes (Signed)
Notified NP on call that EKG showed Afib with a rate of 103.  Vitals are stable and patient currently has no complaints of chest pain of SOB.  Rate ranging from mid 80's to 90's on monitor.  No new orders at this time.  Will continue to monitor patient.  [place]

## 2013-07-10 NOTE — Progress Notes (Addendum)
[facility] PROGRESS NOTE  [name] [id number] DOB: 22-Sep-1921 DOA: 07/08/2013 PCP: Kennon Portela, MD  Interim summary:  80 year old white male with past medical history of systolic/diastolic heart failure, diabetes mellitus and hypertension presented to the emergency room on the evening of 4/15 with confusion and acute renal failure in septic shock from pneumonia. Patient was markedly hypotensive and placed in the step down unit. He actually did not require pressor support and blood pressure was able to be maintained with IV fluids. Over the next 2 days, patient's renal function and blood pressure has continued to improve while his white count has gone down.  Plan is to transfer to floor on 4/17. Once white count normalizes change antibiotics to by mouth. Start ambulating, checking oxygen saturation. Once renal function normalizes, discontinue IV fluids altogether and recheck BNP in morning. Patient may need some gentle diuresis prior to discharge  Assessment/Plan: Principal Problem:   Septic shock: Patient needs criteria given underlying infection of pneumonia, fever, hypotension and acute encephalopathy requiring aggressive fluid support. Patient doing much better. Noted persistent elevated white blood cell count. Fortunately did not require pressor support  Active Problems:   HYPERLIPIDEMIA: On Zocor.  Acute encephalopathy: Secondary to hypotension and septic shock. Has fully resolved today now that blood pressure is stable.  BPH: Flomax held.    HYPERTENSION: Blood pressure improving.    Acute on chronic combined systolic and diastolic heart failure: In review previous echocardiogram, decreased ejection fraction with diastolic failure. BNP would be unrevealing at this point given acutely elevated creatinine plus need for persistent aggressive IV hydration. Recheck in morning. Noted patient's oxygen saturations on 2 L nasal cannula are around 88%, likely for some mild  volume overload.     CAP (community acquired pneumonia): See above. Continue IV antibiotics. Patient's oxygen saturations are stable on 2 L nasal cannula.    ARF (acute renal failure) in the setting of  CKD (chronic kidney disease) stage 2, GFR 60-89 ml/min: Creatinine almost back to baseline. Will decrease IV fluids    Metabolic acidosis: Secondary to sepsis. Anion gap resolved  Severe protein calorie malnutrition:Pt meets criteria for severe MALNUTRITION in the context of acute illness as evidenced by <50% estimated energy intake with 7% weight loss in the past 2 weeks per pt report. Started on Ensure complete daily. Monitor electrolytes as her by mouth intake improves to avoid refeeding syndrome   Code Status: Full Family Communication: Spoke with daughter and wife at the bedside Disposition Plan: Transfer to floor   Consultants:  None  Procedures:  None  Antibiotics:  IV Rocephin 4/15-present  IV Zithromax 4/15-present  HPI/Subjective: Patient doing much better today. Alert and oriented. Not much recollection of last few days  Objective: Filed Vitals:   07/10/13 1200  BP:   Pulse:   Temp: 97.6 F (36.4 C)  Resp:     Intake/Output Summary (Last 24 hours) at 07/10/13 1310 Last data filed at 07/10/13 0600  Gross per 24 hour  Intake   1695 ml  Output    925 ml  Net    770 ml   Filed Weights   07/09/13 0110 07/09/13 0700 07/10/13 0316  Weight: 68.9 kg (151 lb 14.4 oz) 68.8 kg (151 lb 10.8 oz) 68.9 kg (151 lb 14.4 oz)    Exam:   General:  Alert and oriented x3, no acute distress  Cardiovascular: Regular rate and rhythm, S1-S2  Respiratory: Decreased breath sounds bibasilar  Abdomen: Soft, nontender, nondistended, hypoactive bowel  sounds  Musculoskeletal: No clubbing or cyanosis, trace pitting edema   Data Reviewed: Basic Metabolic Panel:  Recent Labs Lab 07/08/13 1405 07/09/13 0309 07/10/13 0317  NA 131* 135* 135*  K 4.7 3.7 3.7  CL 97 105  107  CO2 18* 17* 19  GLUCOSE 78 107* 101*  BUN 85* 69* 42*  CREATININE 3.11* 2.25* 1.55*  CALCIUM 8.9 7.6* 7.6*   Liver Function Tests:  Recent Labs Lab 07/08/13 1405 07/09/13 0309  AST 18 12  ALT 17 11  ALKPHOS 95 75  BILITOT 0.5 0.3  PROT 6.8 5.7*  ALBUMIN 3.3* 2.0*   No results found for this basename: LIPASE, AMYLASE,  in the last 168 hours No results found for this basename: AMMONIA,  in the last 168 hours CBC:  Recent Labs Lab 07/08/13 1407 07/08/13 2222 07/09/13 0309 07/10/13 0317  WBC 15.5* 23.8* 22.2* 14.8*  HGB 13.7* 10.6* 11.3* 10.0*  HCT 41.7* 30.2* 32.6* 30.0*  MCV 89.0 84.6 83.8 87.7  PLT  --  429* 452* 357   Cardiac Enzymes: No results found for this basename: CKTOTAL, CKMB, CKMBINDEX, TROPONINI,  in the last 168 hours BNP (last 3 results)  Recent Labs  07/08/13 2222  PROBNP 3764.0*   CBG:  Recent Labs Lab 07/09/13 0805 07/10/13 0732  GLUCAP 93 100*    Recent Results (from the past 240 hour(s))  URINE CULTURE     Status: None   Collection Time    07/08/13  3:39 PM      Result Value Ref Range Status   Colony Count NO GROWTH   Final   Organism ID, Bacteria NO GROWTH   Final  CULTURE, BLOOD (ROUTINE X 2)     Status: None   Collection Time    07/08/13 10:15 PM      Result Value Ref Range Status   Specimen Description BLOOD RIGHT WRIST   Final   Special Requests BOTTLES DRAWN AEROBIC ONLY 3CC   Final   Culture  Setup Time     Final   Value: 07/09/2013 03:48     Performed at [facility]   Culture     Final   Value:        BLOOD CULTURE RECEIVED NO GROWTH TO DATE CULTURE WILL BE HELD FOR 5 DAYS BEFORE ISSUING A FINAL NEGATIVE REPORT     Performed at [facility]   Report Status PENDING   Incomplete  CULTURE, BLOOD (ROUTINE X 2)     Status: None   Collection Time    07/08/13 10:21 PM      Result Value Ref Range Status   Specimen Description BLOOD LEFT ANTECUBITAL   Final   Special Requests BOTTLES DRAWN AEROBIC AND  ANAEROBIC 5CC   Final   Culture  Setup Time     Final   Value: 07/09/2013 03:48     Performed at [facility]   Culture     Final   Value:        BLOOD CULTURE RECEIVED NO GROWTH TO DATE CULTURE WILL BE HELD FOR 5 DAYS BEFORE ISSUING A FINAL NEGATIVE REPORT     Performed at [facility]   Report Status PENDING   Incomplete  URINE CULTURE     Status: None   Collection Time    07/08/13 10:36 PM      Result Value Ref Range Status   Specimen Description URINE, CATHETERIZED   Final   Special Requests NONE  Final   Culture  Setup Time     Final   Value: 07/09/2013 05:11     Performed at [facility] Count     Final   Value: NO GROWTH     Performed at [facility]   Culture     Final   Value: NO GROWTH     Performed at [facility]   Report Status 07/10/2013 FINAL   Final  MRSA PCR SCREENING     Status: None   Collection Time    07/09/13  1:11 AM      Result Value Ref Range Status   MRSA by PCR NEGATIVE  NEGATIVE Final   Comment:            The GeneXpert MRSA Assay (FDA     approved for NASAL specimens     only), is one component of a     comprehensive MRSA colonization     surveillance program. It is not     intended to diagnose MRSA     infection nor to guide or     monitor treatment for     MRSA infections.     Studies: Dg Thoracic Spine 2 View  07/08/2013   CLINICAL DATA:  [place] on wet floor getting out of shower 3 weeks ago, back pain  EXAM: THORACIC SPINE - 2 VIEW  COMPARISON:  Chest radiographs 04/13/2013  FINDINGS: Twelve pairs of ribs.  Diffuse osseous demineralization.  Biconvex thoracolumbar scoliosis, mild.  Scattered disc space narrowing.  Superior endplate compression fracture of T7 vertebra, less than 50% height loss, stable.  Superior endplate compression fracture T12, approximately 50% height loss, stable.  No additional fracture or subluxation.  Atherosclerotic calcification aorta.  Extensive calcified pleural  plaque formation in both hemithoraces.  IMPRESSION: Stable superior endplate compression fractures of T7 and T12.  Osseous demineralization with biconvex thoracolumbar scoliosis.  No definite acute osseous abnormalities.   Electronically Signed   By: Lavonia Dana M.D.   On: 07/08/2013 14:21   Dg Lumbar Spine 2-3 Views  07/08/2013   CLINICAL DATA:  Low back pain.  EXAM: LUMBAR SPINE - 2-3 VIEW  COMPARISON:  DG THORACIC SPINE 2V dated 07/08/2013  FINDINGS: The lumbar spine demonstrates osteopenia. Compression fracture of T12 was noted on the thoracic plain films. There is mild leftward convex curvature of the lumbar spine. Mild disc space narrowing at L4-5 with associated anterolisthesis of L4 on L5 of approximately 7 mm. No bony lesions are seen. Atherosclerotic calcifications are identified in the abdominal aorta and iliac arteries.  IMPRESSION: Spondylosis at L4-5 with 7 mm anterolisthesis of L4 on L5.   Electronically Signed   By: Aletta Edouard M.D.   On: 07/08/2013 15:17   Dg Chest Port 1 View  07/08/2013   CLINICAL DATA:  Weakness, shortness of breath  EXAM: PORTABLE CHEST - 1 VIEW  COMPARISON:  DG THORACIC SPINE 2V dated 07/08/2013; DG CHEST 2V dated 02/04/2012; DG CHEST 2V dated 04/13/2013  FINDINGS: Bilateral diffuse interstitial thickening and peribronchial cuffing most concerning for bronchitis. There is no pleural effusion or pneumothorax. Stable cardiomediastinal silhouette. Aortic atherosclerosis is noted.  The osseous structures are unremarkable.  IMPRESSION: Bilateral diffuse interstitial thickening and peribronchial cuffing most concerning for bronchitis.   Electronically Signed   By: Kathreen Devoid   On: 07/08/2013 23:02    Scheduled Meds: . aspirin EC  81 mg Oral Daily  . azithromycin  500 mg Intravenous Q24H  .  cefTRIAXone (ROCEPHIN)  IV  1 g Intravenous Q24H  . docusate sodium  100 mg Oral BID  . feeding supplement (ENSURE COMPLETE)  237 mL Oral QHS  . FLORA-Q  1 capsule Oral Daily  .  folic acid  1 mg Oral Daily  . heparin  5,000 Units Subcutaneous 3 times per day  . multivitamin with minerals  1 tablet Oral Daily  . pantoprazole  80 mg Oral Q1200  . simvastatin  20 mg Oral q1800  . sodium chloride  3 mL Intravenous Q12H  . thiamine  100 mg Oral Daily   Continuous Infusions: . sodium chloride 75 mL/hr at 07/10/13 1141    Principal Problem:   Septic shock Active Problems:   HYPERLIPIDEMIA   HYPERTENSION   Acute on chronic combined systolic and diastolic heart failure   CAP (community acquired pneumonia)   ARF (acute renal failure)   CKD (chronic kidney disease) stage 2, GFR 66-29 ml/min   Metabolic acidosis    Time spent: 35 minutes    [facility] Pager [phone]. If 7PM-7AM, please contact night-coverage at [email or web address], password [facility] 07/10/2013, 1:10 PM  LOS: 2 days

## 2013-07-10 NOTE — Clinical Documentation Improvement (Signed)
INITIAL NUTRITION ASSESSMENT  Pt meets criteria for severe MALNUTRITION in the context of acute illness as evidenced by <50% estimated energy intake with 7% weight loss in the past 2 weeks per pt report. Possible Clinical Conditions?  Severe Malnutrition   Protein Calorie Malnutrition Severe Protein Calorie Malnutrition   Other Condition Cannot clinically determine  Supporting Information:reports minimal intake in the past 2 weeks with 12 pound unintended weight loss. Wasn't eating much of anything other than a few bites of oatmeal   Skin: Non-pitting RLE, LLE edema   Risk Factors:Septic shock, UTI,   Signs & Symptoms: Diagnostics:Height: 5'9"  Weight: 151 lbs    BMI: 22.39   Treatment:INTERVENTION:  - Recommend MD monitor pt's potassium, phosphorus, and magnesium as PO intake improves as pt at high risk of refeeding syndrome r/t basically no PO intake x 2 weeks per pt report  - pantoprazole  80 mg  Oral  - Ensure Complete once daily  - Encouraged increased meal intake  - Will continue to monitor Weights, labs, intake    Thank You, Philippa Chester ,RN Clinical Documentation Specialist:  [facility]

## 2013-07-10 NOTE — Progress Notes (Signed)
Received from ICU, alert and oriented , family at bedside. Agree with previous Rn's assessment.

## 2013-07-10 NOTE — Progress Notes (Signed)
NUTRITION FOLLOW UP  Intervention:   - Downgraded diet to dysphagia 3/thin as pt c/o difficulty chewing - Ensure Complete daily - Recommend as PO intake improves, MD monitor pt's potassium, magnesium, and phosphorus as pt at risk for refeeding syndrome r/t basically no PO intake x 2 weeks per pt report - Will continue to monitor   Nutrition Dx:   Unintended weight loss related to not feeling well, not eating much as evidenced by pt report - weight stable since admission  Goal:   Pt to consume >90% of meals/supplements - not met  Monitor:   Weights, labs, intake, refeeding labs  Assessment:   Pt presents to the ED with increased weakness and shortness of breath. Patient apparently was recently in [place] to visit his son. He states he came back to find that he was having less energy. Today he went to urgent care and was seen in urgent care and was felt to have a UTI. In addition he was noted to be hypotensive.   4/16 - Met with pt who reports minimal intake in the past 2 weeks with 12 pound unintended weight loss. Wasn't eating much of anything other than a few bites of oatmeal. Ate a good breakfast this morning. Was eating some chocolate ice cream so unable to perform nutrition focused physical exam. Getting thiamine, folic acid, and multivitamin with minerals  4/17 - Pt states he got a sandwich for dinner with ham, lettuce, and tomato, but he didn't eat it because he couldn't chew it. Did not get Ensure Complete, though it was ordered, will re-order. Requests diet be changed to mechanical soft, will change. Refeeding labs not ordered.   BUN/Cr elevated but trending down  GFR low but improving  Height: Ht Readings from Last 1 Encounters:  07/09/13 $RemoveB'5\' 9"'[id number]$  (1.753 m)    Weight Status:   Wt Readings from Last 1 Encounters:  07/10/13 151 lb 14.4 oz (68.9 kg)  Admit wt         151 lb 14.4 oz (68.9 kg)  Re-estimated needs:  Kcal: 1750-1950  Protein: 85-100g  Fluid: 1.7-1.9L/day     Skin: trace RLE, LLE edema  Diet Order: Dysphagia 3, thin   Intake/Output Summary (Last 24 hours) at 07/10/13 1119 Last data filed at 07/10/13 0600  Gross per 24 hour  Intake   1845 ml  Output    925 ml  Net    920 ml    Last BM: 4/16   Labs:   Recent Labs Lab 07/08/13 1405 07/09/13 0309 07/10/13 0317  NA 131* 135* 135*  K 4.7 3.7 3.7  CL 97 105 107  CO2 18* 17* 19  BUN 85* 69* 42*  CREATININE 3.11* 2.25* 1.55*  CALCIUM 8.9 7.6* 7.6*  GLUCOSE 78 107* 101*    CBG (last 3)   Recent Labs  07/09/13 0805 07/10/13 0732  GLUCAP 93 100*    Scheduled Meds: . aspirin EC  81 mg Oral Daily  . azithromycin  500 mg Intravenous Q24H  . cefTRIAXone (ROCEPHIN)  IV  1 g Intravenous Q24H  . docusate sodium  100 mg Oral BID  . feeding supplement (ENSURE COMPLETE)  237 mL Oral QHS  . FLORA-Q  1 capsule Oral Daily  . folic acid  1 mg Oral Daily  . heparin  5,000 Units Subcutaneous 3 times per day  . multivitamin with minerals  1 tablet Oral Daily  . pantoprazole  80 mg Oral Q1200  . simvastatin  20 mg  Oral q1800  . sodium chloride  3 mL Intravenous Q12H  . thiamine  100 mg Oral Daily    Continuous Infusions: . sodium chloride 75 mL/hr at 07/09/13 2217  . norepinephrine (LEVOPHED) Adult infusion Stopped (07/09/13 0115)    Mikey College MS, RD, [place] Pager [phone] After Hours Pager

## 2013-07-11 DIAGNOSIS — I4891 Unspecified atrial fibrillation: Secondary | ICD-10-CM | POA: Diagnosis present

## 2013-07-11 LAB — BASIC METABOLIC PANEL
BUN: 29 mg/dL — ABNORMAL HIGH (ref 6–23)
CALCIUM: 7.9 mg/dL — AB (ref 8.4–10.5)
CO2: 20 mEq/L (ref 19–32)
CREATININE: 1.29 mg/dL (ref 0.50–1.35)
Chloride: 109 mEq/L (ref 96–112)
GFR calc Af Amer: 55 mL/min — ABNORMAL LOW (ref >90)
GFR, EST NON AFRICAN AMERICAN: 47 mL/min — AB (ref >90)
GLUCOSE: 115 mg/dL — AB (ref 70–99)
Potassium: 3.8 mEq/L (ref 3.7–5.3)
Sodium: 140 mEq/L (ref 137–147)

## 2013-07-11 LAB — GLUCOSE, CAPILLARY: Glucose-Capillary: 94 mg/dL (ref 70–99)

## 2013-07-11 LAB — CBC
HEMATOCRIT: 30.8 % — AB (ref 39.0–52.0)
HEMOGLOBIN: 10.6 g/dL — AB (ref 13.0–17.0)
MCH: 29.4 pg (ref 26.0–34.0)
MCHC: 34.4 g/dL (ref 30.0–36.0)
MCV: 85.3 fL (ref 78.0–100.0)
Platelets: 395 10*3/uL (ref 150–400)
RBC: 3.61 MIL/uL — AB (ref 4.22–5.81)
RDW: 15 % (ref 11.5–15.5)
WBC: 11.5 10*3/uL — ABNORMAL HIGH (ref 4.0–10.5)

## 2013-07-11 LAB — MAGNESIUM: Magnesium: 1.6 mg/dL (ref 1.5–2.5)

## 2013-07-11 LAB — PHOSPHORUS: Phosphorus: 2.9 mg/dL (ref 2.3–4.6)

## 2013-07-11 LAB — PRO B NATRIURETIC PEPTIDE: Pro B Natriuretic peptide (BNP): 13127 pg/mL — ABNORMAL HIGH (ref 0–450)

## 2013-07-11 MED ORDER — METOPROLOL SUCCINATE ER 25 MG PO TB24
25.0000 mg | ORAL_TABLET | Freq: Every day | ORAL | Status: DC
  Administered 2013-07-11 – 2013-07-15 (×5): 25 mg via ORAL
  Filled 2013-07-11 (×5): qty 1

## 2013-07-11 NOTE — Progress Notes (Addendum)
At 2226 I was in the room with the pt and pt was talking to me.  CTM called and said he was in cardiac arrest.  He wasn't.  He does continue to brady down at times when he sleeps and parameters have been set for CTM to report if he stays lower than 40.  PT vss without complaints and NAD.  Pt continues to go in and out of SR to Afib and will have some PVC's as her transitions.

## 2013-07-11 NOTE — Progress Notes (Signed)
[facility] PROGRESS NOTE  JANICE SEALES VEL:[id number] DOB: 1921-11-28 DOA: 07/08/2013 PCP: Kennon Portela, MD  Interim summary:  I have seen and examined Mr. Dennard at bedside in the presence of and also reviewedhischart. Patient is a pleasant 80 year old male with past medical history of systolic/diastolic heart failure, afib on ASA, diabetes mellitus and hypertension who presented to the emergency room on the evening of 4/15 with confusion and acute renal failure in septic shock from pneumonia. Patient was markedly hypotensive and placed in the step down unit. He actually did not require pressor support and blood pressure was able to be maintained with IV fluids. Over the next 2 days, patient's renal function and blood pressure has continued to improve while his white count has gone down. He was transferred to the telemetry floor on 4/17. Apparently, his oxygen saturation declined with ambulation earlier. He is tolerating feeding. Last night he was noted to be in the [facility] with ventricular rate around 103. Patient mentions that he has an irregular heartbeat followed by Dr. Dorris Carnes. We will discontinue IV fluids, resume Toprol XL and consult physical therapy for ambulation. We will hold ACE inhibitor/diuretic for now. Assessment/Plan:  Principal Problem:  Septic shock: Patient met criteria given underlying infection of pneumonia, fever, hypotension and acute encephalopathy requiring aggressive fluid support. Patient doing much better. White blood cell count continues to improve to 11,500 today. Fortunately did not require pressor support. Will continue current IV under [place]. Active Problems:  HYPERLIPIDEMIA: On Zocor.  Acute encephalopathy: Secondary to hypotension and septic shock. Has fully resolved now that blood pressure is stable.  BPH: Flomax held.  HYPERTENSION: Blood pressure improving. Will resume Toprol-XL Acute on chronic combined systolic and diastolic heart failure: In  review previous echocardiogram, decreased ejection fraction with diastolic failure. BNP would be unrevealing at this point given acutely elevated creatinine plus need for persistent aggressive IV hydration.  Patient still has shortness of breath on exertion. Will discontinue IV fluids. CAP (community acquired pneumonia): See above. Continue IV antibiotics. Patient's oxygen saturations are stable on 2 L nasal cannula.  ARF (acute renal failure) in the setting of CKD (chronic kidney disease) stage 2, GFR 60-89 ml/min: Creatinine almost back to baseline. Will discontinue IV fluids  Metabolic acidosis: Secondary to sepsis. Anion gap resolved  Severe protein calorie malnutrition:Pt meets criteria for severe MALNUTRITION in the context of acute illness as evidenced by <50% estimated energy intake with 7% weight loss in the past 2 weeks per pt report. Started on Ensure complete daily. Monitor electrolytes as her by mouth intake improves to avoid refeeding syndrome  Code Status: Full  Family Communication: Spoke with son at the bedside  Disposition Plan: Hopefully home soon. Consultants:  None Procedures:  None Antibiotics:  IV Rocephin 4/15-present  IV Zithromax 4/15-present   HPI/Subjective: Feels better. No complaints.  Objective: Filed Vitals:   07/11/13 1319  BP: 123/64  Pulse: 61  Temp: 97.5 F (36.4 C)  Resp: 16    Intake/Output Summary (Last 24 hours) at 07/11/13 1650 Last data filed at 07/11/13 1500  Gross per 24 hour  Intake   1780 ml  Output   1175 ml  Net    605 ml   Filed Weights   07/09/13 0110 07/09/13 0700 07/10/13 0316  Weight: 68.9 kg (151 lb 14.4 oz) 68.8 kg (151 lb 10.8 oz) 68.9 kg (151 lb 14.4 oz)    Exam:   General:  Sitting up in a chair comfortably.  Cardiovascular: RRR.  No murmurs. S1S2 normal.  Respiratory: Lungs clear.  Abdomen: soft, nontender. +BS.  Musculoskeletal: Normal.  Data Reviewed: Basic Metabolic Panel:  Recent Labs Lab  07/08/13 1405 07/09/13 0309 07/10/13 0317 07/11/13 0518  NA 131* 135* 135* 140  K 4.7 3.7 3.7 3.8  CL 97 105 107 109  CO2 18* 17* 19 20  GLUCOSE 78 107* 101* 115*  BUN 85* 69* 42* 29*  CREATININE 3.11* 2.25* 1.55* 1.29  CALCIUM 8.9 7.6* 7.6* 7.9*  MG  --   --   --  1.6  PHOS  --   --   --  2.9   Liver Function Tests:  Recent Labs Lab 07/08/13 1405 07/09/13 0309  AST 18 12  ALT 17 11  ALKPHOS 95 75  BILITOT 0.5 0.3  PROT 6.8 5.7*  ALBUMIN 3.3* 2.0*   No results found for this basename: LIPASE, AMYLASE,  in the last 168 hours No results found for this basename: AMMONIA,  in the last 168 hours CBC:  Recent Labs Lab 07/08/13 1407 07/08/13 2222 07/09/13 0309 07/10/13 0317 07/11/13 0518  WBC 15.5* 23.8* 22.2* 14.8* 11.5*  HGB 13.7* 10.6* 11.3* 10.0* 10.6*  HCT 41.7* 30.2* 32.6* 30.0* 30.8*  MCV 89.0 84.6 83.8 87.7 85.3  PLT  --  429* 452* 357 395   Cardiac Enzymes: No results found for this basename: CKTOTAL, CKMB, CKMBINDEX, TROPONINI,  in the last 168 hours BNP (last 3 results)  Recent Labs  07/08/13 2222 07/11/13 0518  PROBNP 3764.0* 13127.0*   CBG:  Recent Labs Lab 07/09/13 0805 07/10/13 0732 07/11/13 0734  GLUCAP 93 100* 94    Recent Results (from the past 240 hour(s))  URINE CULTURE     Status: None   Collection Time    07/08/13  3:39 PM      Result Value Ref Range Status   Colony Count NO GROWTH   Final   Organism ID, Bacteria NO GROWTH   Final  CULTURE, BLOOD (ROUTINE X 2)     Status: None   Collection Time    07/08/13 10:15 PM      Result Value Ref Range Status   Specimen Description BLOOD RIGHT WRIST   Final   Special Requests BOTTLES DRAWN AEROBIC ONLY 3CC   Final   Culture  Setup Time     Final   Value: 07/09/2013 03:48     Performed at [facility]   Culture     Final   Value:        BLOOD CULTURE RECEIVED NO GROWTH TO DATE CULTURE WILL BE HELD FOR 5 DAYS BEFORE ISSUING A FINAL NEGATIVE REPORT     Performed at [facility]   Report Status PENDING   Incomplete  CULTURE, BLOOD (ROUTINE X 2)     Status: None   Collection Time    07/08/13 10:21 PM      Result Value Ref Range Status   Specimen Description BLOOD LEFT ANTECUBITAL   Final   Special Requests BOTTLES DRAWN AEROBIC AND ANAEROBIC 5CC   Final   Culture  Setup Time     Final   Value: 07/09/2013 03:48     Performed at [facility]   Culture     Final   Value:        BLOOD CULTURE RECEIVED NO GROWTH TO DATE CULTURE WILL BE HELD FOR 5 DAYS BEFORE ISSUING A FINAL NEGATIVE REPORT     Performed at [facility]  Report Status PENDING   Incomplete  URINE CULTURE     Status: None   Collection Time    07/08/13 10:36 PM      Result Value Ref Range Status   Specimen Description URINE, CATHETERIZED   Final   Special Requests NONE   Final   Culture  Setup Time     Final   Value: 07/09/2013 05:11     Performed at [facility] Count     Final   Value: NO GROWTH     Performed at [facility]   Culture     Final   Value: NO GROWTH     Performed at [facility]   Report Status 07/10/2013 FINAL   Final  MRSA PCR SCREENING     Status: None   Collection Time    07/09/13  1:11 AM      Result Value Ref Range Status   MRSA by PCR NEGATIVE  NEGATIVE Final   Comment:            The GeneXpert MRSA Assay (FDA     approved for NASAL specimens     only), is one component of a     comprehensive MRSA colonization     surveillance program. It is not     intended to diagnose MRSA     infection nor to guide or     monitor treatment for     MRSA infections.     Studies: No results found.  Scheduled Meds: . aspirin EC  81 mg Oral Daily  . azithromycin  500 mg Intravenous Q24H  . cefTRIAXone (ROCEPHIN)  IV  1 g Intravenous Q24H  . docusate sodium  100 mg Oral BID  . feeding supplement (ENSURE COMPLETE)  237 mL Oral QHS  . FLORA-Q  1 capsule Oral Daily  . folic acid  1 mg Oral Daily  . heparin  5,000  Units Subcutaneous 3 times per day  . metoprolol succinate  25 mg Oral Daily  . multivitamin with minerals  1 tablet Oral Daily  . pantoprazole  80 mg Oral Q1200  . simvastatin  20 mg Oral q1800  . sodium chloride  3 mL Intravenous Q12H  . thiamine  100 mg Oral Daily   Continuous Infusions:    [place] [facility] Pager [phone]. If 7PM-7AM, please contact night-coverage at [email or web address], password [facility] 07/11/2013, 4:50 PM  LOS: 3 days

## 2013-07-11 NOTE — Progress Notes (Signed)
Pt ambulated 1/2 lap around unit w/ walker and contact guard assist. 02 sat 98-100% on 2lnc on ambulation though pt did have some mild dyspnea with exertion. Pt took appropriate rest periods and recovered well. Assisted back to bed, bed alarm on. Remains on 2lnc.

## 2013-07-12 LAB — CBC
HCT: 29.1 % — ABNORMAL LOW (ref 39.0–52.0)
HEMOGLOBIN: 10.1 g/dL — AB (ref 13.0–17.0)
MCH: 29.7 pg (ref 26.0–34.0)
MCHC: 34.7 g/dL (ref 30.0–36.0)
MCV: 85.6 fL (ref 78.0–100.0)
Platelets: 445 10*3/uL — ABNORMAL HIGH (ref 150–400)
RBC: 3.4 MIL/uL — ABNORMAL LOW (ref 4.22–5.81)
RDW: 15.2 % (ref 11.5–15.5)
WBC: 10.4 10*3/uL (ref 4.0–10.5)

## 2013-07-12 LAB — BASIC METABOLIC PANEL
BUN: 26 mg/dL — AB (ref 6–23)
CO2: 22 mEq/L (ref 19–32)
Calcium: 8 mg/dL — ABNORMAL LOW (ref 8.4–10.5)
Chloride: 108 mEq/L (ref 96–112)
Creatinine, Ser: 1.12 mg/dL (ref 0.50–1.35)
GFR calc Af Amer: 65 mL/min — ABNORMAL LOW (ref >90)
GFR calc non Af Amer: 56 mL/min — ABNORMAL LOW (ref >90)
GLUCOSE: 93 mg/dL (ref 70–99)
Potassium: 4.1 mEq/L (ref 3.7–5.3)
Sodium: 139 mEq/L (ref 137–147)

## 2013-07-12 LAB — GLUCOSE, CAPILLARY: Glucose-Capillary: 103 mg/dL — ABNORMAL HIGH (ref 70–99)

## 2013-07-12 MED ORDER — POTASSIUM CHLORIDE 20 MEQ/15ML (10%) PO LIQD
10.0000 meq | Freq: Every day | ORAL | Status: DC
Start: 2013-07-12 — End: 2013-07-15
  Administered 2013-07-12 – 2013-07-15 (×4): 10 meq via ORAL
  Filled 2013-07-12 (×4): qty 7.5

## 2013-07-12 MED ORDER — CEPHALEXIN 250 MG/5ML PO SUSR
500.0000 mg | Freq: Three times a day (TID) | ORAL | Status: DC
  Administered 2013-07-12 – 2013-07-14 (×7): 500 mg via ORAL
  Filled 2013-07-12 (×12): qty 10

## 2013-07-12 MED ORDER — FUROSEMIDE 10 MG/ML IJ SOLN
20.0000 mg | Freq: Once | INTRAMUSCULAR | Status: AC
  Administered 2013-07-12: 20 mg via INTRAVENOUS
  Filled 2013-07-12: qty 2

## 2013-07-12 MED ORDER — FUROSEMIDE 20 MG PO TABS
20.0000 mg | ORAL_TABLET | Freq: Every day | ORAL | Status: DC
  Filled 2013-07-12: qty 1

## 2013-07-12 NOTE — Progress Notes (Signed)
[facility] PROGRESS NOTE  TYKEE HEIDEMAN OEH:[id number] DOB: [birth year] DOA: 07/08/2013 PCP: Kennon Portela, MD  Interim summary:  Chidera Thivierge is a pleasant 80 year old male with past medical history of systolic/diastolic heart failure, afib on ASA, diabetes mellitus and hypertension who presented to the emergency room on the evening of 4/15 with confusion and acute renal failure in septic shock from pneumonia. Patient was markedly hypotensive and placed in the step down unit. He actually did not require pressor support and blood pressure was able to be maintained with IV fluids. Patient's renal function and blood pressure has continued to improve while his white count has gone down to 10400 today. He was transferred to the telemetry floor on 4/17. He remains dyspneic with ambulation. Hopefully, this is not related to the congestion as he is BNP 13,000 on the 18th in the setting of renal failure. IV fluids were discontinued on 07/11/13. He is tolerating feeding. On the night of 07/10/13, he was noted to be in afib with ventricular rate around 103. Patient mentioned that he has an irregular heartbeat followed by Dr. Dorris Carnes. Toprol XL was resumed on 07/11/13 with heartrate now fluctuating between 60 and 68. He is on aspirin but not Coumadin apparently due to hightened fall risk.  Have continued to hold ACE inhibitor/diuretic SBP remains borderline low and patient has acute kidney injury. Added low-dose Lasix while awaiting 2-D echocardiogram. Switched antibiotics to Keflex as patient continues to improve. Assessment/Plan:  Principal Problem:  Septic shock: Patient met criteria given underlying infection of pneumonia, fever, hypotension and acute encephalopathy requiring aggressive fluid support. Patient doing much better. White blood cell count continues to improve to 10,400 today. Fortunately did not require pressor support. Will switch antibiotics to Keflex to complete another 5 days.   Active Problems:  HYPERLIPIDEMIA: On Zocor.  Acute encephalopathy: Secondary to hypotension and septic shock. Has fully resolved now that blood pressure is stable.  BPH: Flomax held.  HYPERTENSION: Blood pressure improving. Will resume Toprol-XL  Acute on chronic combined systolic and diastolic heart failure: In review previous echocardiogram, decreased ejection fraction with diastolic failure. BNP would be unrevealing at this point given acutely elevated creatinine plus need for persistent aggressive IV hydration. Patient still has shortness of breath on exertion. Will discontinue IV fluids.  CAP (community acquired pneumonia): See above. Continue antibiotics. Patient's oxygen saturations are stable on 2 L nasal cannula. Obtain 2-D echocardiogram to assess ejection fraction as patient continues to have dyspnea. Add low-dose Lasix. ARF (acute renal failure) in the setting of CKD (chronic kidney disease) stage 2, GFR 60-89 ml/min: Creatinine almost back to baseline. Will discontinue IV fluids  Metabolic acidosis: Secondary to sepsis. Anion gap resolved  Severe protein calorie malnutrition:Pt meets criteria for severe MALNUTRITION in the context of acute illness as evidenced by <50% estimated energy intake with 7% weight loss in the past 2 weeks per pt report. Started on Ensure complete daily. Monitor electrolytes as her by mouth intake improves to avoid refeeding syndrome  Code Status: Full  Family Communication: Spoke with son Belenda Cruise over the phone in the patient's room, updated him on patient's condition and discussed disposition options. Disposition Plan: Hopefully home soon(son Belenda Cruise ready to have him back home once he is medically stable).  Consultants:  None Procedures:  None Antibiotics:  IV Rocephin 4/15-4/19/15 IV Zithromax 4/15-4/19/15 Keflex 07/12/13>  HPI/Subjective: Feels better. Denies shortness of breath.  Objective: Filed Vitals:   07/12/13 0559  BP: 120/58  Pulse: 63  Temp: 97.5 F (36.4 C)  Resp: 16    Intake/Output Summary (Last 24 hours) at 07/12/13 1154 Last data filed at 07/12/13 1112  Gross per 24 hour  Intake 2797.5 ml  Output   1750 ml  Net 1047.5 ml   Filed Weights   07/09/13 0700 07/10/13 0316 07/12/13 0559  Weight: 68.8 kg (151 lb 10.8 oz) 68.9 kg (151 lb 14.4 oz) 69.3 kg (152 lb 12.5 oz)    Exam:  General: Sitting up in a chair comfortably.  Cardiovascular: RRR. No murmurs. S1S2 normal.  Respiratory: Lungs clear.  Abdomen: soft, nontender. +BS.  Musculoskeletal: Normal.  Data Reviewed: Basic Metabolic Panel:  Recent Labs Lab 07/08/13 1405 07/09/13 0309 07/10/13 0317 07/11/13 0518 07/12/13 0455  NA 131* 135* 135* 140 139  K 4.7 3.7 3.7 3.8 4.1  CL 97 105 107 109 108  CO2 18* 17* _0 GLUCOSE 78 107* 101* 115* 93  BUN 85* 69* 42* 29* 26*  CREATININE 3.11* 2.25* 1.55* 1.29 1.12  CALCIUM 8.9 7.6* 7.6* 7.9* 8.0*  MG  --   --   --  1.6  --   PHOS  --   --   --  2.9  --    Liver Function Tests:  Recent Labs Lab 07/08/13 1405 07/09/13 0309  AST 18 12  ALT 17 11  ALKPHOS 95 75  BILITOT 0.5 0.3  PROT 6.8 5.7*  ALBUMIN 3.3* 2.0*   No results found for this basename: LIPASE, AMYLASE,  in the last 168 hours No results found for this basename: AMMONIA,  in the last 168 hours CBC:  Recent Labs Lab 07/08/13 2222 07/09/13 0309 07/10/13 0317 07/11/13 0518 07/12/13 0455  WBC 23.8* 22.2* 14.8* 11.5* 10.4  HGB 10.6* 11.3* 10.0* 10.6* 10.1*  HCT 30.2* 32.6* 30.0* 30.8* 29.1*  MCV 84.6 83.8 87.7 85.3 85.6  PLT 429* 452* 357 395 445*   Cardiac Enzymes: No results found for this basename: CKTOTAL, CKMB, CKMBINDEX, TROPONINI,  in the last 168 hours BNP (last 3 results)  Recent Labs  07/08/13 2222 07/11/13 0518  PROBNP 3764.0* 13127.0*   CBG:  Recent Labs Lab 07/09/13 0805 07/10/13 0732 07/11/13 0734 07/12/13 0814  GLUCAP 93 100* 94 103*    Recent Results (from the past 240 hour(s))  URINE  CULTURE     Status: None   Collection Time    07/08/13  3:39 PM      Result Value Ref Range Status   Colony Count NO GROWTH   Final   Organism ID, Bacteria NO GROWTH   Final  CULTURE, BLOOD (ROUTINE X 2)     Status: None   Collection Time    07/08/13 10:15 PM      Result Value Ref Range Status   Specimen Description BLOOD RIGHT WRIST   Final   Special Requests BOTTLES DRAWN AEROBIC ONLY 3CC   Final   Culture  Setup Time     Final   Value: 07/09/2013 03:48     Performed at [facility]   Culture     Final   Value:        BLOOD CULTURE RECEIVED NO GROWTH TO DATE CULTURE WILL BE HELD FOR 5 DAYS BEFORE ISSUING A FINAL NEGATIVE REPORT     Performed at [facility]   Report Status PENDING   Incomplete  CULTURE, BLOOD (ROUTINE X 2)     Status: None   Collection Time    07/08/13  10:21 PM      Result Value Ref Range Status   Specimen Description BLOOD LEFT ANTECUBITAL   Final   Special Requests BOTTLES DRAWN AEROBIC AND ANAEROBIC 5CC   Final   Culture  Setup Time     Final   Value: 07/09/2013 03:48     Performed at [facility]   Culture     Final   Value:        BLOOD CULTURE RECEIVED NO GROWTH TO DATE CULTURE WILL BE HELD FOR 5 DAYS BEFORE ISSUING A FINAL NEGATIVE REPORT     Performed at [facility]   Report Status PENDING   Incomplete  URINE CULTURE     Status: None   Collection Time    07/08/13 10:36 PM      Result Value Ref Range Status   Specimen Description URINE, CATHETERIZED   Final   Special Requests NONE   Final   Culture  Setup Time     Final   Value: 07/09/2013 05:11     Performed at [facility] Count     Final   Value: NO GROWTH     Performed at [facility]   Culture     Final   Value: NO GROWTH     Performed at [facility]   Report Status 07/10/2013 FINAL   Final  MRSA PCR SCREENING     Status: None   Collection Time    07/09/13  1:11 AM      Result Value Ref Range Status   MRSA by PCR  NEGATIVE  NEGATIVE Final   Comment:            The GeneXpert MRSA Assay (FDA     approved for NASAL specimens     only), is one component of a     comprehensive MRSA colonization     surveillance program. It is not     intended to diagnose MRSA     infection nor to guide or     monitor treatment for     MRSA infections.     Studies: No results found.  Scheduled Meds: . aspirin EC  81 mg Oral Daily  . cephALEXin  500 mg Oral 3 times per day  . docusate sodium  100 mg Oral BID  . feeding supplement (ENSURE COMPLETE)  237 mL Oral QHS  . FLORA-Q  1 capsule Oral Daily  . folic acid  1 mg Oral Daily  . furosemide  20 mg Intravenous Once  . [START ON 07/13/2013] furosemide  20 mg Oral Daily  . heparin  5,000 Units Subcutaneous 3 times per day  . metoprolol succinate  25 mg Oral Daily  . multivitamin with minerals  1 tablet Oral Daily  . pantoprazole  80 mg Oral Q1200  . potassium chloride  10 mEq Oral Daily  . simvastatin  20 mg Oral q1800  . sodium chloride  3 mL Intravenous Q12H  . thiamine  100 mg Oral Daily     Jayleon Mcfarlane  [facility] Pager [phone]. If 7PM-7AM, please contact night-coverage at [email or web address], password [email or web address] 07/12/2013, 11:54 AM  LOS: 4 days

## 2013-07-12 NOTE — Evaluation (Signed)
Physical Therapy Evaluation Patient Details Name: [name] MRN: [id number] DOB: [birth year] Today's Date: 07/12/2013   History of Present Illness  80yo pt admitted with sepsis due to pna, and ARF, HTN, DM.  Clinical Impression  Pt presents with decreased ability with functional mobility and weakness. TO benefit from PT to increase safety with mobility to be able to return home alone.     Follow Up Recommendations Home health PT    Equipment Recommendations  None recommended by PT    Recommendations for Other Services       Precautions / Restrictions        Mobility  Bed Mobility                  Transfers Overall transfer level: Needs assistance Equipment used: Rolling walker (2 wheeled) Transfers: Sit to/from Stand Sit to Stand: Min guard         General transfer comment: cues for RW because this is new to him  Ambulation/Gait Ambulation/Gait assistance: Min guard Ambulation Distance (Feet): 200 Feet Assistive device: Rolling walker (2 wheeled) (first loop RW , second loop no AD ) Gait Pattern/deviations: WFL(Within Functional Limits)        Stairs            Wheelchair Mobility    Modified Rankin (Stroke Patients Only)       Balance                                             Pertinent Vitals/Pain No pain    Home Living Family/patient expects to be discharged to:: Private residence Living Arrangements: Alone;Spouse/significant other;Children (alone , but has a significant other and children who are very supportive. 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      Hand Dominance        Extremity/Trunk Assessment               Lower Extremity Assessment: Overall WFL for tasks assessed         Communication   Communication: No difficulties;HOH  Cognition Arousal/Alertness: Awake/alert Behavior During Therapy: WFL for tasks assessed/performed Overall Cognitive Status: Within Functional Limits for tasks assessed                      General Comments      Exercises        Assessment/Plan    PT Assessment Patient needs continued PT services  PT Diagnosis Generalized weakness   PT Problem List Decreased activity tolerance  PT Treatment Interventions Gait training;Functional mobility training;Therapeutic activities;Therapeutic exercise   PT Goals (Current goals can be found in the Care Plan section) Acute Rehab PT Goals Patient Stated Goal: To retun home when feeling better and be able to walk again at the [employer] PT Goal Formulation: With patient Time For Goal Achievement: 07/26/13 Potential to Achieve Goals: Good    Frequency Min 3X/week   Barriers to discharge  Co-evaluation               End of Session Equipment Utilized During Treatment: Gait belt Activity Tolerance: Patient tolerated treatment well Patient left: in chair;with call bell/phone within reach;with family/visitor present Nurse Communication: Mobility status         Time: 9604-5409 PT Time Calculation (min): 33 min   Charges:   PT Evaluation $Initial PT Evaluation Tier I: 1 Procedure PT Treatments $Gait Training: 8-22 mins $Therapeutic Activity: 8-22 mins   PT G CodesClide Dales Aug 10, 2013, 6:14 PM [name], PT Pager: [phone] Aug 10, 2013

## 2013-07-13 DIAGNOSIS — I359 Nonrheumatic aortic valve disorder, unspecified: Secondary | ICD-10-CM

## 2013-07-13 DIAGNOSIS — I4891 Unspecified atrial fibrillation: Secondary | ICD-10-CM

## 2013-07-13 LAB — CBC
HEMATOCRIT: 31.3 % — AB (ref 39.0–52.0)
HEMOGLOBIN: 10.5 g/dL — AB (ref 13.0–17.0)
MCH: 29 pg (ref 26.0–34.0)
MCHC: 33.5 g/dL (ref 30.0–36.0)
MCV: 86.5 fL (ref 78.0–100.0)
Platelets: 436 10*3/uL — ABNORMAL HIGH (ref 150–400)
RBC: 3.62 MIL/uL — ABNORMAL LOW (ref 4.22–5.81)
RDW: 15.3 % (ref 11.5–15.5)
WBC: 8.1 10*3/uL (ref 4.0–10.5)

## 2013-07-13 LAB — BASIC METABOLIC PANEL
BUN: 23 mg/dL (ref 6–23)
CALCIUM: 8.3 mg/dL — AB (ref 8.4–10.5)
CO2: 24 mEq/L (ref 19–32)
Chloride: 107 mEq/L (ref 96–112)
Creatinine, Ser: 1.11 mg/dL (ref 0.50–1.35)
GFR calc Af Amer: 66 mL/min — ABNORMAL LOW (ref >90)
GFR, EST NON AFRICAN AMERICAN: 57 mL/min — AB (ref >90)
Glucose, Bld: 118 mg/dL — ABNORMAL HIGH (ref 70–99)
Potassium: 3.8 mEq/L (ref 3.7–5.3)
Sodium: 141 mEq/L (ref 137–147)

## 2013-07-13 LAB — GLUCOSE, CAPILLARY: Glucose-Capillary: 116 mg/dL — ABNORMAL HIGH (ref 70–99)

## 2013-07-13 LAB — PRO B NATRIURETIC PEPTIDE: Pro B Natriuretic peptide (BNP): 8242 pg/mL — ABNORMAL HIGH (ref 0–450)

## 2013-07-13 MED ORDER — FUROSEMIDE 10 MG/ML IJ SOLN
20.0000 mg | Freq: Two times a day (BID) | INTRAMUSCULAR | Status: DC
  Administered 2013-07-13 – 2013-07-14 (×3): 20 mg via INTRAVENOUS
  Filled 2013-07-13 (×4): qty 2

## 2013-07-13 NOTE — Progress Notes (Signed)
NUTRITION FOLLOW UP  Intervention:   - Encouraged pt to consume all of his meals and drink Ensure Complete daily (pt requested no more than 1 nutritional supplement per day) - Will continue to monitor   Nutrition Dx:   Unintended weight loss related to not feeling well, not eating much as evidenced by pt report - ongoing, down 1 pound since admission   Goal:   Pt to consume >90% of meals/supplements - not met but improving   Monitor:   Weights, labs, intake  Assessment:   Pt presents to the ED with increased weakness and shortness of breath. Patient apparently was recently in [place] to visit his son. He states he came back to find that he was having less energy. Today he went to urgent care and was seen in urgent care and was felt to have a UTI. In addition he was noted to be hypotensive.   4/16 - Met with pt who reports minimal intake in the past 2 weeks with 12 pound unintended weight loss. Wasn't eating much of anything other than a few bites of oatmeal. Ate a good breakfast this morning. Was eating some chocolate ice cream so unable to perform nutrition focused physical exam. Getting thiamine, folic acid, and multivitamin with minerals  4/17 - Pt states he got a sandwich for dinner with ham, lettuce, and tomato, but he didn't eat it because he couldn't chew it. Did not get Ensure Complete, though it was ordered, will re-order. Requests diet be changed to mechanical soft, will change. Refeeding labs not ordered.   4/20 - Pt reports he has been eating well, ate mostly all of his cheeseburger and jello last night and all of his 2 eggs this morning. Has been drinking Ensure Complete daily which he enjoys. Reports he is chewing better with mechanical soft/dysphagia 3 diet. PO intake mostly 50-75% of meals which is great for pt.   Refeeding labs (potassium, magnesium, phosphorus) WNL  Potassium  Date/Time Value Ref Range Status  07/13/2013  4:03 AM 3.8  3.7 - 5.3 mEq/L Final  07/12/2013   4:55 AM 4.1  3.7 - 5.3 mEq/L Final  07/11/2013  5:18 AM 3.8  3.7 - 5.3 mEq/L Final    Phosphorus  Date/Time Value Ref Range Status  07/11/2013  5:18 AM 2.9  2.3 - 4.6 mg/dL Final    Magnesium  Date/Time Value Ref Range Status  07/11/2013  5:18 AM 1.6  1.5 - 2.5 mg/dL Final     Height: Ht Readings from Last 1 Encounters:  07/09/13 _0  (1.753 m)    Weight Status:   Wt Readings from Last 1 Encounters:  07/13/13 150 lb 6.4 oz (68.221 kg)  Admit wt         151 lb 14.4 oz (68.9 kg)  Re-estimated needs:  Kcal: 1750-1950  Protein: 85-100g  Fluid: 1.7-1.9L/day    Skin: Intact  Diet Order: Dysphagia 3, thin   Intake/Output Summary (Last 24 hours) at 07/13/13 1021 Last data filed at 07/13/13 0746  Gross per 24 hour  Intake   2040 ml  Output   1675 ml  Net    365 ml    Last BM: 4/19   Labs:   Recent Labs Lab 07/10/13 0317 07/11/13 0518 07/12/13 0455 07/13/13 0403  NA 135* 140 139 141  K 3.7 3.8 4.1 3.8  CL 107 109 108 107  CO2 _1 BUN 42* 29* 26* 23  CREATININE 1.55* 1.29 1.12 1.11  CALCIUM 7.6* 7.9* 8.0* 8.3*  MG  --  1.6  --   --   PHOS  --  2.9  --   --   GLUCOSE 101* 115* 93 118*    CBG (last 3)   Recent Labs  07/11/13 0734 07/12/13 0814 07/13/13 0722  GLUCAP 94 103* 116*    Scheduled Meds: . aspirin EC  81 mg Oral Daily  . cephALEXin  500 mg Oral 3 times per day  . docusate sodium  100 mg Oral BID  . feeding supplement (ENSURE COMPLETE)  237 mL Oral QHS  . FLORA-Q  1 capsule Oral Daily  . folic acid  1 mg Oral Daily  . furosemide  20 mg Intravenous Q12H  . heparin  5,000 Units Subcutaneous 3 times per day  . metoprolol succinate  25 mg Oral Daily  . multivitamin with minerals  1 tablet Oral Daily  . pantoprazole  80 mg Oral Q1200  . potassium chloride  10 mEq Oral Daily  . simvastatin  20 mg Oral q1800  . sodium chloride  3 mL Intravenous Q12H  . thiamine  100 mg Oral Daily        Mikey College MS, RD, LDN [phone]  Pager [phone] After Hours Pager

## 2013-07-13 NOTE — Progress Notes (Signed)
  Echocardiogram 2D Echocardiogram has been performed.  Valinda Hoar 07/13/2013, 9:18 AM

## 2013-07-13 NOTE — Progress Notes (Signed)
[facility] PROGRESS NOTE  FRED FRANZEN DGU:[id number] DOB: [birth year] DOA: 07/08/2013 PCP: Kennon Portela, MD  Interim summary:  [name] is a pleasant 80 year old male with past medical history of systolic/diastolic heart failure, afib on ASA, diabetes mellitus and hypertension who presented to the emergency room on the evening of 4/15 with confusion and acute renal failure in septic shock from pneumonia. Patient was markedly hypotensive and placed in the step down unit. He actually did not require pressor support and blood pressure was able to be maintained with IV fluids. Patient's renal function and blood pressure has continued to improve while his white count normalized. He was transferred to the telemetry floor on 4/17.  On the night of 07/10/13, he was noted to be in afib with ventricular rate around 103. Patient mentioned that he has an irregular heartbeat followed by Dr. Dorris Carnes. Toprol XL was resumed on 07/11/13 with heartrate now fluctuating between 60 and 68. He is on aspirin but not Coumadin apparently due to hightened fall risk.  Have continued to hold ACE inhibitor/diuretic SBP remains borderline low and patient has acute kidney injury. Added low-dose Lasix.. Switched antibiotics to Keflex as patient continues to improve.  Assessment/Plan:  Principal Problem:  Septic shock: Patient met criteria given underlying infection of pneumonia, fever, hypotension and acute encephalopathy requiring aggressive fluid support. Patient doing much better. White blood cell count continues to improve to 10,400 today. Fortunately did not require pressor support. Will switch antibiotics to Keflex to complete another 5 days.  Active Problems:  HYPERLIPIDEMIA: On Zocor.   Acute encephalopathy: Secondary to hypotension and septic shock. Has fully resolved now that blood pressure is stable.   BPH: Flomax held.   HYPERTENSION: Blood pressure improving. Will resume Toprol-XL   Acute on  chronic combined systolic and diastolic heart failure: In review previous echocardiogram, decreased ejection fraction with diastolic failure. Patient initially required aggressive IV fluid resuscitation. Once renal function had normalized, echo cardiogram done noting grade 1 diastolic dysfunction and slightly decreased ejection fraction of 45%. BNP on 4/18 at 13,000 and on 4/20 down to 8000. Patient currently IV diuresing  CAP (community acquired pneumonia): See above. Continue antibiotics. Patient's oxygen saturations are stable on 2 L nasal cannula. Obtain 2-D echocardiogram to assess ejection fraction as patient continues to have dyspnea. Add low-dose Lasix. ARF (acute renal failure) in the setting of CKD (chronic kidney disease) stage 2, GFR 60-89 ml/min: Creatinine almost back to baseline.  Metabolic acidosis: Secondary to sepsis. Anion gap resolved   Severe protein calorie malnutrition:Pt meets criteria for severe MALNUTRITION in the context of acute illness as evidenced by <50% estimated energy intake with 7% weight loss in the past 2 weeks per pt report. Started on Ensure complete daily. Monitor electrolytes as her by mouth intake improves to avoid refeeding syndrome  Code Status: Full  Family Communication: Spoke with patient's son and person.  Disposition Plan: Hopefully home soon(son Belenda Cruise ready to have him back home once he is medically stable).  Consultants:  None Procedures:  None Antibiotics:  IV Rocephin 4/15-4/19/15 IV Zithromax 4/15-4/19/15 Keflex 07/12/13-present  HPI/Subjective: Feels good. No complaints. He did get home soon.  Objective: Filed Vitals:   07/13/13 1324  BP: 95/57  Pulse: 63  Temp: 98.1 F (36.7 C)  Resp: 18    Intake/Output Summary (Last 24 hours) at 07/13/13 1527 Last data filed at 07/13/13 1451  Gross per 24 hour  Intake   1920 ml  Output   2725 ml  Net   -805 ml   Filed Weights   07/10/13 0316 07/12/13 0559 07/13/13 0600  Weight: 68.9  kg (151 lb 14.4 oz) 69.3 kg (152 lb 12.5 oz) 68.221 kg (150 lb 6.4 oz)    Exam:  General: Sitting up in a chair comfortably.  Cardiovascular: RRR. No murmurs. S1S2 normal.  Respiratory: Lungs clear.  Abdomen: soft, nontender. +BS.  Musculoskeletal: Normal.  Data Reviewed: Basic Metabolic Panel:  Recent Labs Lab 07/09/13 0309 07/10/13 0317 07/11/13 0518 07/12/13 0455 07/13/13 0403  NA 135* 135* 140 139 141  K 3.7 3.7 3.8 4.1 3.8  CL 105 107 109 108 107  CO2 17* $Remov'19 20 22 24  'mGMDkO$ GLUCOSE 107* 101* 115* 93 118*  BUN 69* 42* 29* 26* 23  CREATININE 2.25* 1.55* 1.29 1.12 1.11  CALCIUM 7.6* 7.6* 7.9* 8.0* 8.3*  MG  --   --  1.6  --   --   PHOS  --   --  2.9  --   --    Liver Function Tests:  Recent Labs Lab 07/08/13 1405 07/09/13 0309  AST 18 12  ALT 17 11  ALKPHOS 95 75  BILITOT 0.5 0.3  PROT 6.8 5.7*  ALBUMIN 3.3* 2.0*   No results found for this basename: LIPASE, AMYLASE,  in the last 168 hours No results found for this basename: AMMONIA,  in the last 168 hours CBC:  Recent Labs Lab 07/09/13 0309 07/10/13 0317 07/11/13 0518 07/12/13 0455 07/13/13 0403  WBC 22.2* 14.8* 11.5* 10.4 8.1  HGB 11.3* 10.0* 10.6* 10.1* 10.5*  HCT 32.6* 30.0* 30.8* 29.1* 31.3*  MCV 83.8 87.7 85.3 85.6 86.5  PLT 452* 357 395 445* 436*   Cardiac Enzymes: No results found for this basename: CKTOTAL, CKMB, CKMBINDEX, TROPONINI,  in the last 168 hours BNP (last 3 results)  Recent Labs  07/08/13 2222 07/11/13 0518 07/13/13 0403  PROBNP 3764.0* 13127.0* 8242.0*   CBG:  Recent Labs Lab 07/09/13 0805 07/10/13 0732 07/11/13 0734 07/12/13 0814 07/13/13 0722  GLUCAP 93 100* 94 103* 116*    Recent Results (from the past 240 hour(s))  URINE CULTURE     Status: None   Collection Time    07/08/13  3:39 PM      Result Value Ref Range Status   Colony Count NO GROWTH   Final   Organism ID, Bacteria NO GROWTH   Final  CULTURE, BLOOD (ROUTINE X 2)     Status: None   Collection  Time    07/08/13 10:15 PM      Result Value Ref Range Status   Specimen Description BLOOD RIGHT WRIST   Final   Special Requests BOTTLES DRAWN AEROBIC ONLY 3CC   Final   Culture  Setup Time     Final   Value: 07/09/2013 03:48     Performed at [facility]   Culture     Final   Value:        BLOOD CULTURE RECEIVED NO GROWTH TO DATE CULTURE WILL BE HELD FOR 5 DAYS BEFORE ISSUING A FINAL NEGATIVE REPORT     Performed at [facility]   Report Status PENDING   Incomplete  CULTURE, BLOOD (ROUTINE X 2)     Status: None   Collection Time    07/08/13 10:21 PM      Result Value Ref Range Status   Specimen Description BLOOD LEFT ANTECUBITAL   Final   Special Requests BOTTLES DRAWN AEROBIC AND ANAEROBIC 5CC  Final   Culture  Setup Time     Final   Value: 07/09/2013 03:48     Performed at [facility]   Culture     Final   Value:        BLOOD CULTURE RECEIVED NO GROWTH TO DATE CULTURE WILL BE HELD FOR 5 DAYS BEFORE ISSUING A FINAL NEGATIVE REPORT     Performed at [facility]   Report Status PENDING   Incomplete  URINE CULTURE     Status: None   Collection Time    07/08/13 10:36 PM      Result Value Ref Range Status   Specimen Description URINE, CATHETERIZED   Final   Special Requests NONE   Final   Culture  Setup Time     Final   Value: 07/09/2013 05:11     Performed at [facility] Count     Final   Value: NO GROWTH     Performed at [facility]   Culture     Final   Value: NO GROWTH     Performed at [facility]   Report Status 07/10/2013 FINAL   Final  MRSA PCR SCREENING     Status: None   Collection Time    07/09/13  1:11 AM      Result Value Ref Range Status   MRSA by PCR NEGATIVE  NEGATIVE Final   Comment:            The GeneXpert MRSA Assay (FDA     approved for NASAL specimens     only), is one component of a     comprehensive MRSA colonization     surveillance program. It is not     intended to diagnose  MRSA     infection nor to guide or     monitor treatment for     MRSA infections.     Studies: No results found.  Scheduled Meds: . aspirin EC  81 mg Oral Daily  . cephALEXin  500 mg Oral 3 times per day  . docusate sodium  100 mg Oral BID  . feeding supplement (ENSURE COMPLETE)  237 mL Oral QHS  . FLORA-Q  1 capsule Oral Daily  . folic acid  1 mg Oral Daily  . furosemide  20 mg Intravenous Q12H  . heparin  5,000 Units Subcutaneous 3 times per day  . metoprolol succinate  25 mg Oral Daily  . multivitamin with minerals  1 tablet Oral Daily  . pantoprazole  80 mg Oral Q1200  . potassium chloride  10 mEq Oral Daily  . simvastatin  20 mg Oral q1800  . sodium chloride  3 mL Intravenous Q12H  . thiamine  100 mg Oral Daily     Sendil Wynelle Link  [facility] Pager [phone]. If 7PM-7AM, please contact night-coverage at [email or web address], password [email or web address] 07/13/2013, 3:27 PM  LOS: 5 days   Time spent: 25 minutes

## 2013-07-13 NOTE — Progress Notes (Signed)
Physical Therapy Treatment Patient Details Name: [name] MRN: [id number] DOB: 1921-12-28 Today's Date: 07/13/2013    History of Present Illness 80yo pt admitted with sepsis due to pna, and ARF, HTN, DM.    PT Comments    Progressing with mobility. Pt should use walker for now-discussed with son and pt. Recommend HHPT, HHOT at discharge.   Follow Up Recommendations  Home health PT;Home Health OT;Supervision - Intermittent     Equipment Recommendations  Rolling walker with 5" wheels    Recommendations for Other Services OT consult     Precautions / Restrictions Precautions Precautions: Fall Restrictions Weight Bearing Restrictions: No    Mobility  Bed Mobility Overal bed mobility: Modified Independent             General bed mobility comments: Increased time. pt used bedrail  Transfers Overall transfer level: Needs assistance   Transfers: Sit to/from Stand Sit to Stand: Min guard         General transfer comment: close guard for safety. sit to stand x 2 during session. VCs safety, hand placement  Ambulation/Gait Ambulation/Gait assistance: Min guard Ambulation Distance (Feet): 500 Feet (125'x1, 500'x1) Assistive device: Rolling walker (2 wheeled);None Gait Pattern/deviations: Trunk flexed;Decreased stride length     General Gait Details: slow gait speed. On 1st walk pt did not use device-Min guard to Min assist (assist for small stumble x 1). Transitioned to walker-improved step length, gait speed and stability. Pt tolerated well.    Stairs            Wheelchair Mobility    Modified Rankin (Stroke Patients Only)       Balance                                    Cognition Arousal/Alertness: Awake/alert Behavior During Therapy: WFL for tasks assessed/performed Overall Cognitive Status: Within Functional Limits for tasks assessed                      Exercises General Exercises - Lower Extremity Hip  Flexion/Marching: AROM;Both;10 reps;Standing Heel Raises: AROM;Both;10 reps;Standing    General Comments        Pertinent Vitals/Pain No c/opain    Home Living                      Prior Function            PT Goals (current goals can now be found in the care plan section) Progress towards PT goals: Progressing toward goals    Frequency  Min 3X/week    PT Plan Discharge plan needs to be updated    Co-evaluation             End of Session Equipment Utilized During Treatment: Gait belt Activity Tolerance: Patient tolerated treatment well Patient left: in chair;with call bell/phone within reach;with family/visitor present     Time: 7824-2353 PT Time Calculation (min): 41 min  Charges:  $Gait Training: 23-37 mins $Therapeutic Exercise: 8-22 mins                    G Codes:      Weston Anna, MPT Pager: [phone]

## 2013-07-14 LAB — CBC
HCT: 32.3 % — ABNORMAL LOW (ref 39.0–52.0)
Hemoglobin: 10.9 g/dL — ABNORMAL LOW (ref 13.0–17.0)
MCH: 29.1 pg (ref 26.0–34.0)
MCHC: 33.7 g/dL (ref 30.0–36.0)
MCV: 86.4 fL (ref 78.0–100.0)
PLATELETS: 460 10*3/uL — AB (ref 150–400)
RBC: 3.74 MIL/uL — ABNORMAL LOW (ref 4.22–5.81)
RDW: 15.3 % (ref 11.5–15.5)
WBC: 9.3 10*3/uL (ref 4.0–10.5)

## 2013-07-14 LAB — BASIC METABOLIC PANEL
BUN: 25 mg/dL — ABNORMAL HIGH (ref 6–23)
CALCIUM: 8.6 mg/dL (ref 8.4–10.5)
CO2: 29 meq/L (ref 19–32)
Chloride: 100 mEq/L (ref 96–112)
Creatinine, Ser: 1.16 mg/dL (ref 0.50–1.35)
GFR calc Af Amer: 62 mL/min — ABNORMAL LOW (ref >90)
GFR calc non Af Amer: 54 mL/min — ABNORMAL LOW (ref >90)
GLUCOSE: 116 mg/dL — AB (ref 70–99)
Potassium: 4.3 mEq/L (ref 3.7–5.3)
Sodium: 140 mEq/L (ref 137–147)

## 2013-07-14 LAB — PRO B NATRIURETIC PEPTIDE: Pro B Natriuretic peptide (BNP): 3581 pg/mL — ABNORMAL HIGH (ref 0–450)

## 2013-07-14 LAB — GLUCOSE, CAPILLARY: Glucose-Capillary: 91 mg/dL (ref 70–99)

## 2013-07-14 MED ORDER — CEFUROXIME AXETIL 500 MG PO TABS
500.0000 mg | ORAL_TABLET | Freq: Two times a day (BID) | ORAL | Status: DC
  Administered 2013-07-14 – 2013-07-15 (×2): 500 mg via ORAL
  Filled 2013-07-14 (×4): qty 1

## 2013-07-14 MED ORDER — FUROSEMIDE 20 MG PO TABS
20.0000 mg | ORAL_TABLET | Freq: Every day | ORAL | Status: DC
  Administered 2013-07-15: 20 mg via ORAL
  Filled 2013-07-14: qty 1

## 2013-07-14 MED ORDER — ENSURE COMPLETE PO LIQD: 237.0000 mL | Freq: Every day | ORAL | Status: DC

## 2013-07-14 MED ORDER — AZITHROMYCIN 500 MG PO TABS
500.0000 mg | ORAL_TABLET | Freq: Every day | ORAL | Status: DC
  Administered 2013-07-14 – 2013-07-15 (×2): 500 mg via ORAL
  Filled 2013-07-14 (×2): qty 1

## 2013-07-14 MED ORDER — DIPHENHYDRAMINE HCL 25 MG PO CAPS
25.0000 mg | ORAL_CAPSULE | Freq: Every day | ORAL | Status: DC
  Administered 2013-07-14: 25 mg via ORAL
  Filled 2013-07-14 (×2): qty 1

## 2013-07-14 MED ORDER — FUROSEMIDE 20 MG PO TABS: 20.0000 mg | ORAL_TABLET | Freq: Every day | ORAL | Status: DC

## 2013-07-14 NOTE — Discharge Summary (Signed)
Physician Discharge Summary  [name] FIE:[id number] DOB: 01-08-1922 DOA: 07/08/2013  PCP: Kennon Portela, MD  Admit date: 07/08/2013 Anticipated Discharge date: 07/15/2013  Time spent: 25 minutes  Recommendations for Outpatient Follow-up:  1. Patient being discharged with home health PT 2. New medication: Lasix 20 mg by mouth daily 3. Medication change: Keflex patient was on prior to admission this continued  Discharge Diagnoses:  Principal Problem:   Septic shock Active Problems:   HYPERLIPIDEMIA   HYPERTENSION   Acute on chronic combined systolic and diastolic heart failure   CAP (community acquired pneumonia)   ARF (acute renal failure)   CKD (chronic kidney disease) stage 2, GFR 16-60 ml/min   Metabolic acidosis   Protein-calorie malnutrition, severe   Acute encephalopathy   A-fib   Discharge Condition: Improved, being discharged home  Diet recommendation: Heart healthy  Filed Weights   07/13/13 0600 07/14/13 0449 07/14/13 1233  Weight: 68.221 kg (150 lb 6.4 oz) 67.8 kg (149 lb 7.6 oz) 66.543 kg (146 lb 11.2 oz)    History of present illness:  [name] is a pleasant 80 year old male with past medical history of systolic/diastolic heart failure, afib on ASA, diabetes mellitus and hypertension who presented to the emergency room on the evening of 4/15 with confusion and acute renal failure in septic shock from pneumonia. Patient was markedly hypotensive and placed in the step down unit.   Hospital Course:  Principal Problem:   Septic shock: Patient met criteria given underlying infection of pneumonia, fever, hypotension and acute encephalopathy requiring aggressive fluid support.He actually did not require pressor support and blood pressure was able to be maintained with IV fluids. Patient's renal function and blood pressure has continued to improve while his white count normalized. He was able to be transferred out of the step down unit to the medical  floor on 4/17. He was changed over to by mouth antibiotics on 4/18. He completed his full antibiotic course prior to discharge.  Active Problems:   HYPERLIPIDEMIA: Stable continue on Zocor    HYPERTENSION: ACE inhibitor and initially held secondary to renal failure. Will be started upon discharge.    Acute on chronic combined systolic and diastolic heart failure: In review patient's previous echocardiogram several years ago, he was noted to have a decreased ejection fraction with diastolic dysfunction. He was noted to be tachycardic even after he was volume repleted. Once his renal function had normalized, BNP was done on 4/18 at 13,000. Echocardiogram was done and a grade 1 diastolic dysfunction and slightly decreased ejection fraction of 45%. Patient was started on IV Lasix and by 4/20, BNP down to 3500. Patient be discharged on Lasix 20 mg by mouth daily.    CAP (community acquired pneumonia): See above. Antibiotics completed by day of discharge    ARF (acute renal failure) in the setting of CKD (chronic kidney disease) stage 2, GFR 60-89 ml/min: Creatinine initially 3.11 with a BUN of 85. This is felt to be secondary to hypotension brought on by septic shock. Patient aggressively rehydrated and by 4/19, creatinine normalized. As above, Lasix IV for diuresis and creatinine has remained stable.    Metabolic acidosis: Secondary to sepsis. Anion gap resolved    Protein-calorie malnutrition, severe:Pt meets criteria for severe MALNUTRITION in the context of acute illness as evidenced by <50% estimated energy intake with 7% weight loss in the past 2 weeks per pt report. Started on Ensure complete daily. Monitor electrolytes as her by mouth intake improves to avoid  refeeding syndrome      Acute encephalopathy: Secondary to hypotension and septic shock. His blood pressure stabilized, encephalopathy resolved by hospital day 2.  BPH: Flomax initially held. Resumed on discharge.    A-fib:On the night  of 07/10/13, he was noted to be in afib with ventricular rate around 103. Patient mentioned that he has an irregular heartbeat followed by Dr. Dorris Carnes. Toprol XL was resumed on 07/11/13 with heartrate now fluctuating between 60 and 68. He is on aspirin but not Coumadin apparently due to hightened fall risk.  Procedures:  None  Consultations:  None  Discharge Exam: Filed Vitals:   07/14/13 1238  BP: 108/57  Pulse:   Temp:   Resp:     General: Alert and oriented x3, no acute distress Cardiovascular: Regular rate and rhythm, S1-S2 Respiratory: Clear auscultation bilaterally  Discharge Instructions You were cared for by a hospitalist during your hospital stay. If you have any questions about your discharge medications or the care you received while you were in the hospital after you are discharged, you can call the unit and asked to speak with the hospitalist on call if the hospitalist that took care of you is not available. Once you are discharged, your primary care physician will handle any further medical issues. Please note that NO REFILLS for any discharge medications will be authorized once you are discharged, as it is imperative that you return to your primary care physician (or establish a relationship with a primary care physician if you do not have one) for your aftercare needs so that they can reassess your need for medications and monitor your lab values.   Future Appointments Provider Department Dept Phone   08/10/2013 8:00 AM Darreld Mclean, MD [facility] [phone]       Medication List    STOP taking these medications       cephALEXin 500 MG capsule  Commonly known as:  KEFLEX      TAKE these medications       esomeprazole 40 MG capsule  Commonly known as:  NEXIUM  Take 1 capsule (40 mg total) by mouth daily at 12 noon.     feeding supplement (ENSURE COMPLETE) Liqd  Take 237 mLs by mouth at bedtime.     FLOMAX 0.4 MG Caps capsule   Generic drug:  tamsulosin  Take 0.4 mg by mouth daily.     furosemide 20 MG tablet  Commonly known as:  LASIX  Take 1 tablet (20 mg total) by mouth daily.     lisinopril-hydrochlorothiazide 10-12.5 MG per tablet  Commonly known as:  PRINZIDE,ZESTORETIC  Take 1 tablet by mouth daily with breakfast.     metoprolol succinate 25 MG 24 hr tablet  Commonly known as:  TOPROL-XL  Take 1 tablet (25 mg total) by mouth daily with breakfast.     OVER THE COUNTER MEDICATION  Take 1 tablet by mouth daily. Over the counter probiotic (not align)     PRESERVISION AREDS 2 PO  Take 1 tablet by mouth 2 (two) times daily. 1 TAB TWICE A DAY     simvastatin 20 MG tablet  Commonly known as:  ZOCOR  Take 1 tablet (20 mg total) by mouth daily.       Allergies  Allergen Reactions  . Ciprofloxacin     Questionable allergy as pt reports taking med without difficulty for resp infection  . Penicillins Rash       Follow-up Information   Follow  up with [place]. (PT, RN - [facility] will call after d/c)    Contact information:   [address] [place] [address] [phone]       Follow up with GUEST, Veneda Melter, MD In 1 month.   Specialty:  Internal Medicine   Contact information:   [place] [address] [phone]        The results of significant diagnostics from this hospitalization (including imaging, microbiology, ancillary and laboratory) are listed below for reference.    Significant Diagnostic Studies: Dg Thoracic Spine 2 View  07/08/2013   CLINICAL DATA:  [place] on wet floor getting out of shower 3 weeks ago, back pain  EXAM: THORACIC SPINE - 2 VIEW  COMPARISON:  Chest radiographs 04/13/2013  FINDINGS: Twelve pairs of ribs.  Diffuse osseous demineralization.  Biconvex thoracolumbar scoliosis, mild.  Scattered disc space narrowing.  Superior endplate compression fracture of T7 vertebra, less than 50% height loss, stable.  Superior endplate  compression fracture T12, approximately 50% height loss, stable.  No additional fracture or subluxation.  Atherosclerotic calcification aorta.  Extensive calcified pleural plaque formation in both hemithoraces.  IMPRESSION: Stable superior endplate compression fractures of T7 and T12.  Osseous demineralization with biconvex thoracolumbar scoliosis.  No definite acute osseous abnormalities.   Electronically Signed   By: Lavonia Dana M.D.   On: 07/08/2013 14:21   Dg Lumbar Spine 2-3 Views  07/08/2013   CLINICAL DATA:  Low back pain.  EXAM: LUMBAR SPINE - 2-3 VIEW  COMPARISON:  DG THORACIC SPINE 2V dated 07/08/2013  FINDINGS: The lumbar spine demonstrates osteopenia. Compression fracture of T12 was noted on the thoracic plain films. There is mild leftward convex curvature of the lumbar spine. Mild disc space narrowing at L4-5 with associated anterolisthesis of L4 on L5 of approximately 7 mm. No bony lesions are seen. Atherosclerotic calcifications are identified in the abdominal aorta and iliac arteries.  IMPRESSION: Spondylosis at L4-5 with 7 mm anterolisthesis of L4 on L5.   Electronically Signed   By: Aletta Edouard M.D.   On: 07/08/2013 15:17   Dg Chest Port 1 View  07/08/2013   CLINICAL DATA:  Weakness, shortness of breath  EXAM: PORTABLE CHEST - 1 VIEW  COMPARISON:  DG THORACIC SPINE 2V dated 07/08/2013; DG CHEST 2V dated 02/04/2012; DG CHEST 2V dated 04/13/2013  FINDINGS: Bilateral diffuse interstitial thickening and peribronchial cuffing most concerning for bronchitis. There is no pleural effusion or pneumothorax. Stable cardiomediastinal silhouette. Aortic atherosclerosis is noted.  The osseous structures are unremarkable.  IMPRESSION: Bilateral diffuse interstitial thickening and peribronchial cuffing most concerning for bronchitis.   Electronically Signed   By: Kathreen Devoid   On: 07/08/2013 23:02    Microbiology: Recent Results (from the past 240 hour(s))  URINE CULTURE     Status: None   Collection  Time    07/08/13  3:39 PM      Result Value Ref Range Status   Colony Count NO GROWTH   Final   Organism ID, Bacteria NO GROWTH   Final  CULTURE, BLOOD (ROUTINE X 2)     Status: None   Collection Time    07/08/13 10:15 PM      Result Value Ref Range Status   Specimen Description BLOOD RIGHT WRIST   Final   Special Requests BOTTLES DRAWN AEROBIC ONLY 3CC   Final   Culture  Setup Time     Final   Value: 07/09/2013 03:48  Performed at [facility]     Final   Value:        BLOOD CULTURE RECEIVED NO GROWTH TO DATE CULTURE WILL BE HELD FOR 5 DAYS BEFORE ISSUING A FINAL NEGATIVE REPORT     Performed at [facility]   Report Status PENDING   Incomplete  CULTURE, BLOOD (ROUTINE X 2)     Status: None   Collection Time    07/08/13 10:21 PM      Result Value Ref Range Status   Specimen Description BLOOD LEFT ANTECUBITAL   Final   Special Requests BOTTLES DRAWN AEROBIC AND ANAEROBIC 5CC   Final   Culture  Setup Time     Final   Value: 07/09/2013 03:48     Performed at [facility]   Culture     Final   Value:        BLOOD CULTURE RECEIVED NO GROWTH TO DATE CULTURE WILL BE HELD FOR 5 DAYS BEFORE ISSUING A FINAL NEGATIVE REPORT     Performed at [facility]   Report Status PENDING   Incomplete  URINE CULTURE     Status: None   Collection Time    07/08/13 10:36 PM      Result Value Ref Range Status   Specimen Description URINE, CATHETERIZED   Final   Special Requests NONE   Final   Culture  Setup Time     Final   Value: 07/09/2013 05:11     Performed at [facility]     Final   Value: NO GROWTH     Performed at [facility]   Culture     Final   Value: NO GROWTH     Performed at [facility]   Report Status 07/10/2013 FINAL   Final  MRSA PCR SCREENING     Status: None   Collection Time    07/09/13  1:11 AM      Result Value Ref Range Status   MRSA by PCR NEGATIVE  NEGATIVE Final   Comment:             The GeneXpert MRSA Assay (FDA     approved for NASAL specimens     only), is one component of a     comprehensive MRSA colonization     surveillance program. It is not     intended to diagnose MRSA     infection nor to guide or     monitor treatment for     MRSA infections.     Labs: Basic Metabolic Panel:  Recent Labs Lab 07/10/13 0317 07/11/13 0518 07/12/13 0455 07/13/13 0403 07/14/13 0330  NA 135* 140 139 141 140  K 3.7 3.8 4.1 3.8 4.3  CL 107 109 108 107 100  CO2 $Re'19 20 22 24 29  'UtJ$ GLUCOSE 101* 115* 93 118* 116*  BUN 42* 29* 26* 23 25*  CREATININE 1.55* 1.29 1.12 1.11 1.16  CALCIUM 7.6* 7.9* 8.0* 8.3* 8.6  MG  --  1.6  --   --   --   PHOS  --  2.9  --   --   --    Liver Function Tests:  Recent Labs Lab 07/08/13 1405 07/09/13 0309  AST 18 12  ALT 17 11  ALKPHOS 95 75  BILITOT 0.5 0.3  PROT 6.8 5.7*  ALBUMIN 3.3* 2.0*   No results found for this basename: LIPASE, AMYLASE,  in the last 168 hours No results found for this basename: AMMONIA,  in the last 168 hours CBC:  Recent Labs Lab 07/10/13 0317 07/11/13 0518 07/12/13 0455 07/13/13 0403 07/14/13 0330  WBC 14.8* 11.5* 10.4 8.1 9.3  HGB 10.0* 10.6* 10.1* 10.5* 10.9*  HCT 30.0* 30.8* 29.1* 31.3* 32.3*  MCV 87.7 85.3 85.6 86.5 86.4  PLT 357 395 445* 436* 460*   Cardiac Enzymes: No results found for this basename: CKTOTAL, CKMB, CKMBINDEX, TROPONINI,  in the last 168 hours BNP: BNP (last 3 results)  Recent Labs  07/11/13 0518 07/13/13 0403 07/14/13 0330  PROBNP 13127.0* 8242.0* 3581.0*   CBG:  Recent Labs Lab 07/10/13 0732 07/11/13 0734 07/12/13 0814 07/13/13 0722 07/14/13 0732  GLUCAP 100* 94 103* 116* 91       Signed:  Alyvia Derk K Ellyse Rotolo  [facility] 07/14/2013, 3:53 PM

## 2013-07-14 NOTE — Progress Notes (Signed)
Physical Therapy Treatment Patient Details Name: [name] MRN: [id number] DOB: 1922/02/10 Today's Date: 07/14/2013    History of Present Illness 80yo pt admitted with sepsis due to pna, and ARF, HTN, DM.    PT Comments    **Pt progressing well with mobility. He is safe to DC home with RW from PT standpoint. He plans to DC to son's home. Today he walked 600' with RW without loss of balance. *  Follow Up Recommendations  Home health PT;Supervision - Intermittent     Equipment Recommendations  Rolling walker with 5" wheels    Recommendations for Other Services OT consult     Precautions / Restrictions Precautions Precautions: Fall Restrictions Weight Bearing Restrictions: No    Mobility  Bed Mobility                  Transfers Overall transfer level: Needs assistance Equipment used: Rolling walker (2 wheeled) Transfers: Sit to/from Stand Sit to Stand: Supervision         General transfer comment: VCs for hand placement  Ambulation/Gait Ambulation/Gait assistance: Supervision Ambulation Distance (Feet): 600 Feet Assistive device: Rolling walker (2 wheeled) Gait Pattern/deviations: Trunk flexed     General Gait Details: VCs for flexed neck, steady with RW   Stairs            Wheelchair Mobility    Modified Rankin (Stroke Patients Only)       Balance                                    Cognition Arousal/Alertness: Awake/alert Behavior During Therapy: WFL for tasks assessed/performed Overall Cognitive Status: Within Functional Limits for tasks assessed                      Exercises General Exercises - Lower Extremity Hip Flexion/Marching: AROM;10 reps;Seated;Both Heel Raises: AROM;Both;10 reps;Seated    General Comments        Pertinent Vitals/Pain **0/10 pain SaO2 98% on RA at rest and with walking*    Home Living                      Prior Function            PT Goals (current  goals can now be found in the care plan section) Acute Rehab PT Goals Patient Stated Goal: To return home when feeling better and be able to walk on track and go to [employer] again at the [employer] PT Goal Formulation: With patient Time For Goal Achievement: 07/26/13 Potential to Achieve Goals: Good Progress towards PT goals: Progressing toward goals    Frequency  Min 3X/week    PT Plan Current plan remains appropriate    Co-evaluation             End of Session Equipment Utilized During Treatment: Gait belt Activity Tolerance: Patient tolerated treatment well Patient left: in chair;with call bell/phone within reach;with family/visitor present     Time: 1050-1109 PT Time Calculation (min): 19 min  Charges:  $Gait Training: 8-22 mins                    G Codes:      Lucile Crater 07/14/2013, 11:16 AM [phone]

## 2013-07-14 NOTE — Care Management Note (Signed)
    Page 1 of 2   07/14/2013     3:49:22 PM CARE MANAGEMENT NOTE 07/14/2013  Patient:  [name], [name]   Account Number:  [id number]  Date Initiated:  07/09/2013  Documentation initiated by:  DAVIS,RHONDA  Subjective/Objective Assessment:   uti, pna [place]  fell x3 days ago-thoracic compression  fractures     Action/Plan:   from home   Anticipated DC Date:  07/15/2013   Anticipated DC Plan:  [place]  In-house referral  NA      DC Planning Services  CM consult      [facility] Choice  [place]   Choice offered to / List presented to:  C-4 Adult Children   DME arranged  [place]      DME agency  [place] arranged  [place].   Status of service:  Completed, signed off Medicare Important Message given?  NA - LOS <3 / Initial given by admissions (If response is "NO", the following Medicare IM given date fields will be blank) Date Medicare IM given:   Date Additional Medicare IM given:    Discharge Disposition:  [place]  Per UR Regulation:  Reviewed for med. necessity/level of care/duration of stay  If discussed at [place] of Stay Meetings, dates discussed:   07/14/2013    Comments:  [id number] Rosana Hoes Dierks, [place], [place] [phone] Chart Reviewed for discharge and hospital needs. Discharge needs at time of review: None present will follow for needs. Review of patient progress due on [id number].

## 2013-07-14 NOTE — Progress Notes (Signed)
Came to visit patient at bedside to offer [facility] services. Explained services and patient states he has home health arranged. Explained to him that [facility] will not interfere or replace home health services. Nonetheless, he opted to decline [facility] Care Management services at this time. Left [facility] Care Management brochure with contact information at bedside for him to call in the future if he changes his mind. Will make inpatient RN case manager aware.  Marthenia Rolling, MSN- [facility] [phone]

## 2013-07-14 NOTE — Discharge Instructions (Signed)
Heart Failure Heart failure means your heart has trouble pumping blood. This makes it hard for your body to work well. Heart failure is usually a long-term (chronic) condition. You must take good care of yourself and follow your doctor's treatment plan. HOME CARE  Take your heart medicine as told by your doctor.  Do not stop taking medicine unless your doctor tells you to.  Do not skip any dose of medicine.  Refill your medicines before they run out.  Take other medicines only as told by your doctor or pharmacist.  Stay active if told by your doctor. The elderly and people with severe heart failure should talk with a doctor about physical activity.  Eat heart healthy foods. Choose foods that are without trans fat and are low in saturated fat, cholesterol, and salt (sodium). This includes fresh or frozen fruits and vegetables, fish, lean meats, fat-free or low-fat dairy foods, whole grains, and high-fiber foods. Lentils and dried peas and beans (legumes) are also good choices.  Limit salt if told by your doctor.  Cook in a healthy way. Roast, grill, broil, bake, poach, steam, or stir-fry foods.  Limit fluids as told by your doctor.  Weigh yourself every morning. Do this after you pee (urinate) and before you eat breakfast. Write down your weight to give to your doctor.  Take your blood pressure and write it down if your doctor tell you to.  Ask your doctor how to check your pulse. Check your pulse as told.  Lose weight if told by your doctor.  Stop smoking or chewing tobacco. Do not use gum or patches that help you quit without your doctor's approval.  Schedule and go to doctor visits as told.  Nonpregnant women should have no more than 1 drink a day. Men should have no more than 2 drinks a day. Talk to your doctor about drinking alcohol.  Stop illegal drug use.  Stay current with shots (immunizations).  Manage your health conditions as told by your doctor.  Learn to manage  your stress.  Rest when you are tired.  If it is really hot outside:  Avoid intense activities.  Use air conditioning or fans, or get in a cooler place.  Avoid caffeine and alcohol.  Wear loose-fitting, lightweight, and light-colored clothing.  If it is really cold outside:  Avoid intense activities.  Layer your clothing.  Wear mittens or gloves, a hat, and a scarf when going outside.  Avoid alcohol.  Learn about heart failure and get support as needed.  Get help to maintain or improve your quality of life and your ability to care for yourself as needed. GET HELP IF:   You gain 03 lb/1.4 kg or more in 1 day or 05 lb/2.3 kg in a week.  You are more short of breath than usual.  You cannot do your normal activities.  You tire easily.  You cough more than normal, especially with activity.  You have any or more puffiness (swelling) in areas such as your hands, feet, ankles, or belly (abdomen).  You cannot sleep because it is hard to breathe.  You feel like your heart is beating fast (palpitations).  You get dizzy or lightheaded when you stand up. GET HELP RIGHT AWAY IF:   You have trouble breathing.  There is a change in mental status, such as becoming less alert or not being able to focus.  You have chest pain or discomfort.  You faint. MAKE SURE YOU:   Understand these   instructions.  Will watch your condition.  Will get help right away if you are not doing well or get worse. Document Released: 12/20/2007 Document Revised: 07/07/2012 Document Reviewed: 10/11/2011 Exit[employer] Patient Information 2014 [employer].  

## 2013-07-15 LAB — CULTURE, BLOOD (ROUTINE X 2)
CULTURE: NO GROWTH
Culture: NO GROWTH

## 2013-07-15 LAB — GLUCOSE, CAPILLARY: Glucose-Capillary: 84 mg/dL (ref 70–99)

## 2013-07-15 NOTE — Progress Notes (Signed)
D/C instructions reviewed w/ pt and son, Marya Amsler.  Both verbalize understanding and all questions answered. Son, Marya Amsler, spoke w/ Claiborne Billings, [place], prior to d/c per his request. Pt d/c in w/c in stable condition by NT to son's car. Pt in possession of d/c instructions, walker, and all personal belongings. Script for Lasix called into pt's pharmacy and pt and son aware.

## 2013-07-16 ENCOUNTER — Encounter (INDEPENDENT_AMBULATORY_CARE_PROVIDER_SITE_OTHER): Payer: Medicare Other | Admitting: Ophthalmology

## 2013-07-25 ENCOUNTER — Ambulatory Visit (INDEPENDENT_AMBULATORY_CARE_PROVIDER_SITE_OTHER): Payer: Medicare Other | Admitting: Internal Medicine

## 2013-07-25 VITALS — BP 106/60 | HR 75 | Temp 97.7°F | Resp 16 | Ht 64.5 in | Wt 146.0 lb

## 2013-07-25 DIAGNOSIS — Z87448 Personal history of other diseases of urinary system: Secondary | ICD-10-CM

## 2013-07-25 DIAGNOSIS — I509 Heart failure, unspecified: Secondary | ICD-10-CM

## 2013-07-25 DIAGNOSIS — Z7189 Other specified counseling: Secondary | ICD-10-CM

## 2013-07-25 NOTE — Progress Notes (Signed)
   Subjective:    Patient ID: [name], male    DOB: 1921/06/05, 80 y.o.   MRN: [id number]  HPI 80 year old male pt presents with the need of assisted living forms to be completed. Was hospitalized about 2 weeks ago with pneumonia. He is doing a lot better. Overall he feels pretty good right now. Pt had UTI about 3 weeks ago and may not have finished full course of antibiotic. Wants clarification as to whether the UTI returned when he flew out to [place]. He started having diarrhea and felt weak after he got off the plane. When he came back from [place] he was admitted into [facility] for pneumonia. Four days after he was discharged from the hospital he stopped taking the Lasix. His son wants to know if it was a good idea to stop the Lasix. His appetite is good.  See last admission for chf,arf, due to altitude in [facility].  Review of Systems     Objective:   Physical Exam  Constitutional: He is oriented to person, place, and time. He appears well-developed and well-nourished. No distress.  HENT:  Head: Normocephalic.  Eyes: EOM are normal.  Cardiovascular: Normal rate, regular rhythm and normal heart sounds.   Pulmonary/Chest: Effort normal and breath sounds normal.  Musculoskeletal: Normal range of motion. He exhibits no edema.  Neurological: He is alert and oriented to person, place, and time.  Psychiatric: He has a normal mood and affect. His behavior is normal. Judgment and thought content normal.          Assessment & Plan:  Form completed for [facility]

## 2013-07-25 NOTE — Patient Instructions (Signed)
Benign Prostatic Hyperplasia An enlarged prostate (benign prostatic hyperplasia) is common in older men. You may experience the following:  Weak urine stream.  Dribbling.  Feeling like the bladder has not emptied completely.  Difficulty starting urination.  Getting up frequently at night to urinate.  Urinating more frequently during the day. HOME CARE INSTRUCTIONS  Monitor your prostatic hyperplasia for any changes. The following actions may help to alleviate any discomfort you are experiencing:  Give yourself time when you urinate.  Stay away from alcohol.  Avoid beverages containing caffeine, such as coffee, tea, and colas, because they can make the problem worse.  Avoid decongestants, antihistamines, and some prescription medicines that can make the problem worse.  Follow up with your health care provider for further treatment as recommended. SEEK MEDICAL CARE IF:  You are experiencing progressive difficulty voiding.  Your urine stream is progressively getting narrower.  You are awaking from sleep with the urge to void more frequently.  You are constantly feeling the need to void.  You experience loss of urine, especially in small amounts. SEEK IMMEDIATE MEDICAL CARE IF:   You develop increased pain with urination or are unable to urinate.  You develop severe abdominal pain, vomiting, a high fever, or fainting.  You develop back pain or blood in your urine. MAKE SURE YOU:   Understand these instructions.  Will watch your condition.  Will get help right away if you are not doing well or get worse. Document Released: 03/12/2005 Document Revised: 11/12/2012 Document Reviewed: 08/12/2012 [facility] Patient Information 2014 [employer]. Tamsulosin capsules What is this medicine? TAMSULOSIN (tam SOO loe sin) is used to treat enlargement of the prostate gland in men, a condition called benign prostatic hyperplasia or BPH. It is not for use in women. It works by  relaxing muscles in the prostate and bladder neck. This improves urine flow and reduces BPH symptoms. This medicine may be used for other purposes; ask your health care provider or pharmacist if you have questions. COMMON BRAND NAME(S): Flomax What should I tell my health care provider before I take this medicine? They need to know if you have any of the following conditions: -advanced kidney disease -advanced liver disease -low blood pressure -prostate cancer -an unusual or allergic reaction to tamsulosin, sulfa drugs, other medicines, foods, dyes, or preservatives -pregnant or trying to get pregnant -breast-feeding How should I use this medicine? Take this medicine by mouth about 30 minutes after the same meal every day. Follow the directions on the prescription label. Swallow the capsules whole with a glass of water. Do not crush, chew, or open capsules. Do not take your medicine more often than directed. Do not stop taking your medicine unless your doctor tells you to. Talk to your pediatrician regarding the use of this medicine in children. Special care may be needed. Overdosage: If you think you have taken too much of this medicine contact a poison control center or emergency room at once. NOTE: This medicine is only for you. Do not share this medicine with others. What if I miss a dose? If you miss a dose, take it as soon as you can. If it is almost time for your next dose, take only that dose. Do not take double or extra doses. If you stop taking your medicine for several days or more, ask your doctor or health care professional what dose you should start back on. What may interact with this medicine? -cimetidine -fluoxetine -ketoconazole -medicines for erectile disfunction like sildenafil,  tadalafil, vardenafil -medicines for high blood pressure -other alpha-blockers like alfuzosin, doxazosin, phentolamine, phenoxybenzamine, prazosin, terazosin -warfarin This list may not describe  all possible interactions. Give your health care provider a list of all the medicines, herbs, non-prescription drugs, or dietary supplements you use. Also tell them if you smoke, drink alcohol, or use illegal drugs. Some items may interact with your medicine. What should I watch for while using this medicine? Visit your doctor or health care professional for regular check ups. You will need lab work done before you start this medicine and regularly while you are taking it. Check your blood pressure as directed. Ask your health care professional what your blood pressure should be, and when you should contact him or her. This medicine may make you feel dizzy or lightheaded. This is more likely to happen after the first dose, after an increase in dose, or during hot weather or exercise. Drinking alcohol and taking some medicines can make this worse. Do not drive, use machinery, or do anything that needs mental alertness until you know how this medicine affects you. Do not sit or stand up quickly. If you begin to feel dizzy, sit down until you feel better. These effects can decrease once your body adjusts to the medicine. Contact your doctor or health care professional right away if you have an erection that lasts longer than 4 hours or if it becomes painful. This may be a sign of a serious problem and must be treated right away to prevent permanent damage. If you are thinking of having cataract surgery, tell your eye surgeon that you have taken this medicine. What side effects may I notice from receiving this medicine? Side effects that you should report to your doctor or health care professional as soon as possible: -allergic reactions like skin rash or itching, hives, swelling of the lips, mouth, tongue, or throat -breathing problems -change in vision -feeling faint or lightheaded -irregular heartbeat -prolonged or painful erection -weakness Side effects that usually do not require medical attention  (report to your doctor or health care professional if they continue or are bothersome): -back pain -change in sex drive or performance -constipation, nausea or vomiting -cough -drowsy -runny or stuffy nose -trouble sleeping This list may not describe all possible side effects. Call your doctor for medical advice about side effects. You may report side effects to FDA at 1-800-FDA-1088. Where should I keep my medicine? Keep out of the reach of children. Store at room temperature between 15 and 30 degrees C (59 and 86 degrees F). Throw away any unused medicine after the expiration date. NOTE: This sheet is a summary. It may not cover all possible information. If you have questions about this medicine, talk to your doctor, pharmacist, or health care provider.  2014, [employer]vier/Gold Standard. (2012-03-12 14:11:34)

## 2013-08-10 ENCOUNTER — Ambulatory Visit (INDEPENDENT_AMBULATORY_CARE_PROVIDER_SITE_OTHER): Payer: Medicare Other | Admitting: Family Medicine

## 2013-08-10 ENCOUNTER — Encounter: Payer: Self-pay | Admitting: Family Medicine

## 2013-08-10 VITALS — BP 110/60 | HR 50 | Temp 98.7°F | Resp 18 | Ht 64.5 in | Wt 151.0 lb

## 2013-08-10 DIAGNOSIS — Z8619 Personal history of other infectious and parasitic diseases: Secondary | ICD-10-CM

## 2013-08-10 DIAGNOSIS — Z23 Encounter for immunization: Secondary | ICD-10-CM

## 2013-08-10 DIAGNOSIS — H9190 Unspecified hearing loss, unspecified ear: Secondary | ICD-10-CM

## 2013-08-10 DIAGNOSIS — N179 Acute kidney failure, unspecified: Secondary | ICD-10-CM

## 2013-08-10 DIAGNOSIS — I4891 Unspecified atrial fibrillation: Secondary | ICD-10-CM

## 2013-08-10 DIAGNOSIS — I509 Heart failure, unspecified: Secondary | ICD-10-CM

## 2013-08-10 LAB — CBC WITH DIFFERENTIAL/PLATELET
Basophils Absolute: 0.1 10*3/uL (ref 0.0–0.1)
Basophils Relative: 1 % (ref 0–1)
EOS ABS: 0.2 10*3/uL (ref 0.0–0.7)
Eosinophils Relative: 2 % (ref 0–5)
HCT: 36.4 % — ABNORMAL LOW (ref 39.0–52.0)
HEMOGLOBIN: 12.1 g/dL — AB (ref 13.0–17.0)
LYMPHS ABS: 1.6 10*3/uL (ref 0.7–4.0)
Lymphocytes Relative: 20 % (ref 12–46)
MCH: 29.4 pg (ref 26.0–34.0)
MCHC: 33.2 g/dL (ref 30.0–36.0)
MCV: 88.6 fL (ref 78.0–100.0)
MONO ABS: 0.8 10*3/uL (ref 0.1–1.0)
MONOS PCT: 10 % (ref 3–12)
Neutro Abs: 5.3 10*3/uL (ref 1.7–7.7)
Neutrophils Relative %: 67 % (ref 43–77)
PLATELETS: 260 10*3/uL (ref 150–400)
RBC: 4.11 MIL/uL — ABNORMAL LOW (ref 4.22–5.81)
RDW: 15.2 % (ref 11.5–15.5)
WBC: 7.9 10*3/uL (ref 4.0–10.5)

## 2013-08-10 LAB — COMPREHENSIVE METABOLIC PANEL
ALT: 8 U/L (ref 0–53)
AST: 12 U/L (ref 0–37)
Albumin: 3.6 g/dL (ref 3.5–5.2)
Alkaline Phosphatase: 82 U/L (ref 39–117)
BILIRUBIN TOTAL: 0.5 mg/dL (ref 0.2–1.2)
BUN: 21 mg/dL (ref 6–23)
CALCIUM: 9.1 mg/dL (ref 8.4–10.5)
CO2: 25 meq/L (ref 19–32)
CREATININE: 1.08 mg/dL (ref 0.50–1.35)
Chloride: 103 mEq/L (ref 96–112)
GLUCOSE: 97 mg/dL (ref 70–99)
Potassium: 3.9 mEq/L (ref 3.5–5.3)
Sodium: 138 mEq/L (ref 135–145)
Total Protein: 6.2 g/dL (ref 6.0–8.3)

## 2013-08-10 NOTE — Progress Notes (Signed)
[facility] [address] [place] [address] [phone]  Date:  08/10/2013   Name:  [name]   DOB:  1922-01-19   MRN:  [id number]  PCP:  Kennon Portela, MD    Chief Complaint: Follow-up   History of Present Illness:  [name] is a 80 y.o. very pleasant male patient who presents with the following:  Here today for a recheck- seen with a UTI on 4/15 and then admitted for a week with sepsis due to pneumonia.  He also had a CHF exacerbation, and ARF at that time.  He was noted to be in a.fib then as well He was seen about 2 weeks ago for assisted living forms He states he is here for a physical today.   He is feeling "great." He is enjoying living at [facility].  His "lady friend" is going to move in with him soon and he is pleased about this.  He has been seeing this woman for about 4 years and their relationship is going well.    His cardiologist is Dr. Harrington Challenger Recent A1c at 6.4%  He has his penumovax in 2009- no prevnar yet.  Otherwise his immunizations are UTD  Patient Active Problem List   Diagnosis Date Noted  . A-fib 07/11/2013  . Protein-calorie malnutrition, severe 07/10/2013  . Acute encephalopathy 07/10/2013  . CAP (community acquired pneumonia) 07/09/2013  . ARF (acute renal failure) 07/09/2013  . CKD (chronic kidney disease) stage 2, GFR 60-89 ml/min 07/09/2013  . Metabolic acidosis 33/29/5188  . Septic shock 07/09/2013  . BPH (benign prostatic hyperplasia) 02/04/2012  . MELENA 08/01/2009  . TINEA CORPORIS 06/27/2009  . HYPERLIPIDEMIA 06/27/2009  . TREMOR, ESSENTIAL 06/27/2009  . HYPERTENSION 06/27/2009  . TOBACCO USE, QUIT 06/27/2009  . CHANGE IN BOWELS 02/01/2009  . PERSONAL HX COLONIC POLYPS 02/01/2009  . DIARRHEA 01/31/2009  . Acute on chronic combined systolic and diastolic heart failure 41/66/0630  . Chronic systolic heart failure 16/03/930  . GERD 11/19/2007  . BARRETTS ESOPHAGUS 11/19/2007  . GASTRITIS  11/19/2007  . HIATAL HERNIA 11/19/2007  . DIVERTICULOSIS, COLON 11/19/2007    Past Medical History  Diagnosis Date  . BARRETTS ESOPHAGUS 11/19/2007  . CARDIOMYOPATHY 08/02/2008  . CHANGE IN BOWELS 02/01/2009  . CHF 08/10/2008  . Diarrhea 01/31/2009  . DIVERTICULOSIS, COLON 11/19/2007  . GASTRITIS 11/19/2007  . GERD 11/19/2007  . HIATAL HERNIA 11/19/2007  . HYPERLIPIDEMIA 06/27/2009  . HYPOTHYROIDISM 06/27/2009  . MELENA 08/01/2009  . PERSONAL HX COLONIC POLYPS 02/01/2009  . TINEA CORPORIS 06/27/2009  . TOBACCO USE, QUIT 06/27/2009  . TREMOR, ESSENTIAL 06/27/2009  . HTN (hypertension)   . IBS (irritable bowel syndrome)   . Heart murmur   . Blood transfusion without reported diagnosis     Past Surgical History  Procedure Laterality Date  . Cholecystectomy    . Lung surgery  1984  . Umbilical hernia repair    . Tonsillectomy    . Vasectomy    . Esophagogastroduodenoscopy N/A 04/28/2013    Procedure: ESOPHAGOGASTRODUODENOSCOPY (EGD);  Surgeon: Jerene Bears, MD;  Location: [facility];  Service: Gastroenterology;  Laterality: N/A;  . Tonsillectomy    . Hernia repair      History  Substance Use Topics  . Smoking status: Former Smoker -- 3.00 packs/day for 24 years    Types: Cigarettes    Quit date: 03/27/1943  . Smokeless tobacco: Never Used     Comment: Widowed x 2,  has lady friend  . Alcohol Use: No     Comment: occassional    Family History  Problem Relation Age of Onset  . Heart disease Mother   . Colon cancer Father   . Prostate cancer Brother     Allergies  Allergen Reactions  . Ciprofloxacin     Questionable allergy as pt reports taking med without difficulty for resp infection  . Penicillins Rash    Medication list has been reviewed and updated.  Current Outpatient Prescriptions on File Prior to Visit  Medication Sig Dispense Refill  . esomeprazole (NEXIUM) 40 MG capsule Take 1 capsule (40 mg total) by mouth daily at 12 noon.  30 capsule  6  . feeding supplement,  ENSURE COMPLETE, (ENSURE COMPLETE) LIQD Take 237 mLs by mouth at bedtime.  30 Bottle  1  . furosemide (LASIX) 20 MG tablet Take 1 tablet (20 mg total) by mouth daily.  30 tablet  0  . lisinopril-hydrochlorothiazide (PRINZIDE,ZESTORETIC) 10-12.5 MG per tablet Take 1 tablet by mouth daily with breakfast.  90 tablet  3  . metoprolol succinate (TOPROL-XL) 25 MG 24 hr tablet Take 1 tablet (25 mg total) by mouth daily with breakfast.  90 tablet  3  . Multiple Vitamins-Minerals (PRESERVISION AREDS 2 PO) Take 1 tablet by mouth 2 (two) times daily. 1 TAB TWICE A DAY      . OVER THE COUNTER MEDICATION Take 1 tablet by mouth daily. Over the counter probiotic (not align)      . simvastatin (ZOCOR) 20 MG tablet Take 1 tablet (20 mg total) by mouth daily.  90 tablet  3  . Tamsulosin HCl (FLOMAX) 0.4 MG CAPS Take 0.4 mg by mouth daily.        No current facility-administered medications on file prior to visit.    Review of Systems:  As per HPI- otherwise negative.   Physical Examination: Filed Vitals:   08/10/13 0749  BP: 110/60  Pulse: 50  Temp: 98.7 F (37.1 C)  Resp: 18   Filed Vitals:   08/10/13 0749  Height: 5' 4.5" (1.638 m)  Weight: 151 lb (68.493 kg)   Body mass index is 25.53 kg/(m^2). Ideal Body Weight: Weight in (lb) to have BMI = 25: 147.6  GEN: WDWN, NAD, Non-toxic, A & O x 3, appears well for age.  Quite hard of hearing HEENT: Atraumatic, Normocephalic. Neck supple. No masses, No LAD. Ears and Nose: No external deformity. CV: RRR, No M/G/R. No JVD. No thrill. No extra heart sounds. PULM: CTA B, no wheezes, crackles, rhonchi. No retractions. No resp. distress. No accessory muscle use. ABD: S, NT, ND. No rebound. No HSM. EXTR: No c/c/e NEURO Normal gait.  PSYCH: Normally interactive. Conversant. Not depressed or anxious appearing.  Calm demeanor.   EKG:  Sinus bradycardia with rate around 55 bpm.  Not currently in fib  Assessment and Plan: Atrial fibrillation - Plan: EKG  12-Lead  CHF (congestive heart failure) - Plan: CBC with Differential, Brain natriuretic peptide  ARF (acute renal failure) - Plan: Comprehensive metabolic panel  Hard of hearing  History of sepsis - Plan: CBC with Differential  Immunization due - Plan: Pneumococcal conjugate vaccine 13-valent IM  History of a fib.  Today appears to be in sinus rhythm.  Check BNP, will touch base with Dr. Harrington Challenger regarding his follow-up.   Follow up renal function today Recent sepsis- recheck CBC Updated pneumonia vaccinations with prevnar today Unable to give a urine sample today- this is  ok    Signed Lamar Blinks, MD

## 2013-08-10 NOTE — Patient Instructions (Addendum)
I will email Dr. Harrington Challenger and see if you need to be seen sooner.   I will be in touch with your labs.  You got your second pneumonia shot today.   Take care and let us know if you have any problems!

## 2013-08-11 ENCOUNTER — Encounter: Payer: Self-pay | Admitting: Family Medicine

## 2013-08-11 ENCOUNTER — Encounter (INDEPENDENT_AMBULATORY_CARE_PROVIDER_SITE_OTHER): Payer: Medicare Other | Admitting: Ophthalmology

## 2013-08-11 DIAGNOSIS — H353290 Exudative age-related macular degeneration, unspecified eye, stage unspecified: Secondary | ICD-10-CM

## 2013-08-11 DIAGNOSIS — H35039 Hypertensive retinopathy, unspecified eye: Secondary | ICD-10-CM

## 2013-08-11 DIAGNOSIS — I10 Essential (primary) hypertension: Secondary | ICD-10-CM

## 2013-08-11 DIAGNOSIS — H43819 Vitreous degeneration, unspecified eye: Secondary | ICD-10-CM

## 2013-08-11 DIAGNOSIS — H3530 Unspecified macular degeneration: Secondary | ICD-10-CM

## 2013-08-11 LAB — BRAIN NATRIURETIC PEPTIDE: BRAIN NATRIURETIC PEPTIDE: 92.9 pg/mL (ref 0.0–100.0)

## 2013-08-12 ENCOUNTER — Encounter (INDEPENDENT_AMBULATORY_CARE_PROVIDER_SITE_OTHER): Payer: Medicare Other | Admitting: Ophthalmology

## 2013-08-18 ENCOUNTER — Ambulatory Visit: Payer: Medicare Other

## 2013-08-18 ENCOUNTER — Telehealth: Payer: Self-pay | Admitting: Family Medicine

## 2013-08-18 ENCOUNTER — Ambulatory Visit (INDEPENDENT_AMBULATORY_CARE_PROVIDER_SITE_OTHER): Payer: Medicare Other | Admitting: Family Medicine

## 2013-08-18 VITALS — BP 118/75 | HR 61 | Temp 97.5°F | Resp 18 | Wt 155.0 lb

## 2013-08-18 DIAGNOSIS — R3912 Poor urinary stream: Secondary | ICD-10-CM

## 2013-08-18 DIAGNOSIS — D649 Anemia, unspecified: Secondary | ICD-10-CM

## 2013-08-18 DIAGNOSIS — R600 Localized edema: Secondary | ICD-10-CM

## 2013-08-18 DIAGNOSIS — R609 Edema, unspecified: Secondary | ICD-10-CM

## 2013-08-18 DIAGNOSIS — Z8679 Personal history of other diseases of the circulatory system: Secondary | ICD-10-CM

## 2013-08-18 DIAGNOSIS — R39198 Other difficulties with micturition: Secondary | ICD-10-CM

## 2013-08-18 DIAGNOSIS — E785 Hyperlipidemia, unspecified: Secondary | ICD-10-CM

## 2013-08-18 LAB — POCT URINALYSIS DIPSTICK
Bilirubin, UA: NEGATIVE
Blood, UA: NEGATIVE
Glucose, UA: NEGATIVE
Ketones, UA: NEGATIVE
Leukocytes, UA: NEGATIVE
Nitrite, UA: NEGATIVE
Protein, UA: NEGATIVE
Spec Grav, UA: 1.01
Urobilinogen, UA: 0.2
pH, UA: 5.5

## 2013-08-18 LAB — POCT CBC
Granulocyte percent: 72.2 %G (ref 37–80)
HCT, POC: 39.9 % — AB (ref 43.5–53.7)
Hemoglobin: 12.4 g/dL — AB (ref 14.1–18.1)
Lymph, poc: 1.9 (ref 0.6–3.4)
MCH, POC: 28.8 pg (ref 27–31.2)
MCHC: 31.1 g/dL — AB (ref 31.8–35.4)
MCV: 92.9 fL (ref 80–97)
MID (cbc): 0.7 (ref 0–0.9)
MPV: 8.5 fL (ref 0–99.8)
POC Granulocyte: 6.8 (ref 2–6.9)
POC LYMPH PERCENT: 20.7 % (ref 10–50)
POC MID %: 7.1 % (ref 0–12)
Platelet Count, POC: 244 10*3/uL (ref 142–424)
RBC: 4.3 M/uL — AB (ref 4.69–6.13)
RDW, POC: 16.3 %
WBC: 9.4 10*3/uL (ref 4.6–10.2)

## 2013-08-18 LAB — COMPLETE METABOLIC PANEL WITH GFR
ALT: 8 U/L (ref 0–53)
AST: 12 U/L (ref 0–37)
Albumin: 3.8 g/dL (ref 3.5–5.2)
CO2: 30 mEq/L (ref 19–32)
Calcium: 9 mg/dL (ref 8.4–10.5)
Chloride: 102 mEq/L (ref 96–112)
Creat: 1.04 mg/dL (ref 0.50–1.35)
GFR, Est Non African American: 63 mL/min
Glucose, Bld: 98 mg/dL (ref 70–99)
Potassium: 4 mEq/L (ref 3.5–5.3)

## 2013-08-18 LAB — POCT UA - MICROSCOPIC ONLY
Bacteria, U Microscopic: NEGATIVE
Casts, Ur, LPF, POC: NEGATIVE
Crystals, Ur, HPF, POC: NEGATIVE
Epithelial cells, urine per micros: NEGATIVE
Mucus, UA: NEGATIVE
RBC, urine, microscopic: NEGATIVE
WBC, Ur, HPF, POC: NEGATIVE
Yeast, UA: NEGATIVE

## 2013-08-18 LAB — LIPID PANEL
Cholesterol: 149 mg/dL (ref 0–200)
HDL: 36 mg/dL — ABNORMAL LOW (ref >39)
LDL Cholesterol: 86 mg/dL (ref 0–99)
Total CHOL/HDL Ratio: 4.1 ratio
Triglycerides: 137 mg/dL (ref <150)
VLDL: 27 mg/dL (ref 0–40)

## 2013-08-18 LAB — COMPLETE METABOLIC PANEL WITHOUT GFR
Alkaline Phosphatase: 91 U/L (ref 39–117)
BUN: 18 mg/dL (ref 6–23)
GFR, Est African American: 73 mL/min
Sodium: 139 meq/L (ref 135–145)
Total Bilirubin: 0.6 mg/dL (ref 0.2–1.2)
Total Protein: 6.2 g/dL (ref 6.0–8.3)

## 2013-08-18 MED ORDER — SIMVASTATIN 20 MG PO TABS: 20.0000 mg | ORAL_TABLET | Freq: Every day | ORAL | Status: DC

## 2013-08-18 MED ORDER — FUROSEMIDE 20 MG PO TABS: ORAL_TABLET | ORAL | Status: DC

## 2013-08-18 NOTE — Progress Notes (Signed)
Chief Complaint:  Chief Complaint  Patient presents with  . Foot Swelling    ankles swelling    HPI: [name] is a 80 y.o. male who is here for  bilateral Lower extremity edema , he does not know when he aquired it, his son noticed it yesterday when he was over and suggested he comes in for evaluation. He denies any fevers, chills,  CP, SOB, cough, PND or orthopnea. He was recently in hosptial for CHF and also PNA sepsis. Doing well overall. Was dc on lasix 20 mg daily but has not taken it. Has had problems with decrease urinry flow and history of recurrent UTIs. He denies weight gain. He has had swelling in his legs only. He has not done anything for this. He is currently moved into [facility] with his lady friend Raquel Sarna and does not know where his Lasix since move  Past Medical History  Diagnosis Date  . BARRETTS ESOPHAGUS 11/19/2007  . CARDIOMYOPATHY 08/02/2008  . CHANGE IN BOWELS 02/01/2009  . CHF 08/10/2008  . Diarrhea 01/31/2009  . DIVERTICULOSIS, COLON 11/19/2007  . GASTRITIS 11/19/2007  . GERD 11/19/2007  . HIATAL HERNIA 11/19/2007  . HYPERLIPIDEMIA 06/27/2009  . HYPOTHYROIDISM 06/27/2009  . MELENA 08/01/2009  . PERSONAL HX COLONIC POLYPS 02/01/2009  . TINEA CORPORIS 06/27/2009  . TOBACCO USE, QUIT 06/27/2009  . TREMOR, ESSENTIAL 06/27/2009  . HTN (hypertension)   . IBS (irritable bowel syndrome)   . Heart murmur   . Blood transfusion without reported diagnosis    Past Surgical History  Procedure Laterality Date  . Cholecystectomy    . Lung surgery  1984  . Umbilical hernia repair    . Tonsillectomy    . Vasectomy    . Esophagogastroduodenoscopy N/A 04/28/2013    Procedure: ESOPHAGOGASTRODUODENOSCOPY (EGD);  Surgeon: Jerene Bears, MD;  Location: [facility];  Service: Gastroenterology;  Laterality: N/A;  . Tonsillectomy    . Hernia repair     History   Social History  . Marital Status: Widowed    Spouse Name: N/A    Number of Children: N/A  . Years of  Education: N/A   Occupational History  . retired Clinical biochemist    Social History Main Topics  . Smoking status: Former Smoker -- 3.00 packs/day for 24 years    Types: Cigarettes    Quit date: 03/27/1943  . Smokeless tobacco: Never Used     Comment: Widowed x 2, has lady friend  . Alcohol Use: No     Comment: occassional  . Drug Use: No  . Sexual Activity: No   Other Topics Concern  . None   Social History Narrative  . None   Family History  Problem Relation Age of Onset  . Heart disease Mother   . Colon cancer Father   . Prostate cancer Brother    Allergies  Allergen Reactions  . Ciprofloxacin     Questionable allergy as pt reports taking med without difficulty for resp infection  . Penicillins Rash   Prior to Admission medications   Medication Sig Start Date End Date Taking? Authorizing Provider  esomeprazole (NEXIUM) 40 MG capsule Take 1 capsule (40 mg total) by mouth daily at 12 noon. 05/28/13  Yes Jerene Bears, MD  feeding supplement, ENSURE COMPLETE, (ENSURE COMPLETE) LIQD Take 237 mLs by mouth at bedtime. 07/14/13  Yes Annita Brod, MD  furosemide (LASIX) 20 MG tablet Take 1 tablet (20 mg total) by mouth  daily. 07/14/13  Yes Annita Brod, MD  lisinopril-hydrochlorothiazide (PRINZIDE,ZESTORETIC) 10-12.5 MG per tablet Take 1 tablet by mouth daily with breakfast. 05/27/13  Yes Fay Records, MD  metoprolol succinate (TOPROL-XL) 25 MG 24 hr tablet Take 1 tablet (25 mg total) by mouth daily with breakfast. 05/27/13  Yes Fay Records, MD  Multiple Vitamins-Minerals (PRESERVISION AREDS 2 PO) Take 1 tablet by mouth 2 (two) times daily. 1 TAB TWICE A DAY   Yes Historical Provider, MD  OVER THE COUNTER MEDICATION Take 1 tablet by mouth daily. Over the counter probiotic (not align)   Yes Historical Provider, MD  simvastatin (ZOCOR) 20 MG tablet Take 1 tablet (20 mg total) by mouth daily. 05/27/13  Yes Fay Records, MD  Tamsulosin HCl (FLOMAX) 0.4 MG CAPS Take 0.4 mg by mouth  daily.    Yes Historical Provider, MD     ROS: The patient denies fevers, chills, night sweats, unintentional weight loss, chest pain, palpitations, wheezing, dyspnea on exertion, nausea, vomiting, abdominal pain, dysuria, hematuria, melena, numbness, weakness, or tingling.   All other systems have been reviewed and were otherwise negative with the exception of those mentioned in the HPI and as above.    PHYSICAL EXAM: Filed Vitals:   08/18/13 0747  BP: 118/75  Pulse: 61  Temp: 97.5 F (36.4 C)  Resp: 18   Filed Vitals:   08/18/13 0747  Weight: 155 lb (70.308 kg)   Body mass index is 26.2 kg/(m^2).  General: Alert, no acute distress HEENT:  Normocephalic, atraumatic, oropharynx patent. EOMI, PERRLA Cardiovascular:  Regular rate and rhythm, no rubs murmurs or gallops.  NoJVD ; radial pulse intact. + trace pedal edema.  Respiratory: Clear to auscultation bilaterally.  No wheezes, rales, or rhonchi.  No cyanosis, no use of accessory musculature GI: No organomegaly, abdomen is soft and non-tender, positive bowel sounds.  No masses. Skin: No rashes. Neurologic: Facial musculature symmetric. Psychiatric: Patient is appropriate throughout our interaction. Lymphatic: No cervical lymphadenopathy Musculoskeletal: Gait intact.   LABS: Results for orders placed in visit on 08/18/13  POCT UA - MICROSCOPIC ONLY      Result Value Ref Range   WBC, Ur, HPF, POC neg     RBC, urine, microscopic neg     Bacteria, U Microscopic neg     Mucus, UA neg     Epithelial cells, urine per micros neg     Crystals, Ur, HPF, POC neg     Casts, Ur, LPF, POC neg     Yeast, UA neg    POCT URINALYSIS DIPSTICK      Result Value Ref Range   Color, UA yellow     Clarity, UA clear     Glucose, UA neg     Bilirubin, UA neg     Ketones, UA neg     Spec Grav, UA 1.010     Blood, UA neg     pH, UA 5.5     Protein, UA neg     Urobilinogen, UA 0.2     Nitrite, UA neg     Leukocytes, UA Negative      POCT CBC      Result Value Ref Range   WBC 9.4  4.6 - 10.2 K/uL   Lymph, poc 1.9  0.6 - 3.4   POC LYMPH PERCENT 20.7  10 - 50 %L   MID (cbc) 0.7  0 - 0.9   POC MID % 7.1  0 - 12 %M  POC Granulocyte 6.8  2 - 6.9   Granulocyte percent 72.2  37 - 80 %G   RBC 4.30 (*) 4.69 - 6.13 M/uL   Hemoglobin 12.4 (*) 14.1 - 18.1 g/dL   HCT, POC 39.9 (*) 43.5 - 53.7 %   MCV 92.9  80 - 97 fL   MCH, POC 28.8  27 - 31.2 pg   MCHC 31.1 (*) 31.8 - 35.4 g/dL   RDW, POC 16.3     Platelet Count, POC 244  142 - 424 K/uL   MPV 8.5  0 - 99.8 fL     EKG/XRAY:   Primary read interpreted by Dr. Marin Comment at [facility]. Chest xray shows no acute cardiopulmonary process, similar to 03/2013 cxr before acute CHF.     ASSESSMENT/PLAN: Encounter Diagnoses  Name Primary?  . Bilateral lower extremity edema Yes  . Anemia   . History of CHF (congestive heart failure)   . Weak urine stream   . Other and unspecified hyperlipidemia    Pleasant 80 y/o male who is hard of hearing who is here for bilateral pedal edema, without overt CHF signs except pedal edema which he has been off of Lasix Rx compression socks ( written rx given) Urine is normal Lipid panel pending Refilled Lasix 20 mg every other day due to low BP and potential for falls, ask [facility] to measure BP, weight Called son to d/w OV F/u in 48-72 hours if no improvement.   Gross sideeffects, risk and benefits, and alternatives of medications d/w patient. Patient is aware that all medications have potential sideeffects and we are unable to predict every sideeffect or drug-drug interaction that may occur.  Glenford Bayley, DO 08/18/2013 8:57 AM

## 2013-08-18 NOTE — Telephone Encounter (Signed)
Spoke with son regarding plan.

## 2013-08-24 ENCOUNTER — Encounter: Payer: Self-pay | Admitting: Family Medicine

## 2013-08-31 ENCOUNTER — Encounter: Payer: Self-pay | Admitting: Internal Medicine

## 2013-08-31 ENCOUNTER — Ambulatory Visit (INDEPENDENT_AMBULATORY_CARE_PROVIDER_SITE_OTHER): Payer: Medicare Other | Admitting: Internal Medicine

## 2013-08-31 VITALS — BP 124/71 | HR 70 | Ht 64.0 in | Wt 151.0 lb

## 2013-08-31 DIAGNOSIS — I5022 Chronic systolic (congestive) heart failure: Secondary | ICD-10-CM

## 2013-08-31 DIAGNOSIS — I10 Essential (primary) hypertension: Secondary | ICD-10-CM

## 2013-08-31 NOTE — Progress Notes (Signed)
HPI HPIPatient is an 80 year old with a history of presumed NICM (norml perfusion on myoview), hypertension and dyslipidemia  The patient was admittted in April pneumonia/sepsis.  He was in atrial fibrillation duing admit.  Note echo done during admit LVEF was 45%  SInce d/c he says his breathing is good.  Still with some LE edema    Allergies  Allergen Reactions  . Ciprofloxacin     Questionable allergy as pt reports taking med without difficulty for resp infection  . Penicillins Rash    Current Outpatient Prescriptions  Medication Sig Dispense Refill  . esomeprazole (NEXIUM) 40 MG capsule Take 1 capsule (40 mg total) by mouth daily at 12 noon.  30 capsule  6  . feeding supplement, ENSURE COMPLETE, (ENSURE COMPLETE) LIQD Take 237 mLs by mouth at bedtime.  30 Bottle  1  . furosemide (LASIX) 20 MG tablet Take 20 mg PO every other day. Please monitor blood pressure. You may get dizzy on this medication.  30 tablet  3  . lisinopril-hydrochlorothiazide (PRINZIDE,ZESTORETIC) 10-12.5 MG per tablet Take 1 tablet by mouth daily with breakfast.  90 tablet  3  . metoprolol succinate (TOPROL-XL) 25 MG 24 hr tablet Take 1 tablet (25 mg total) by mouth daily with breakfast.  90 tablet  3  . Multiple Vitamins-Minerals (PRESERVISION AREDS 2 PO) Take 1 tablet by mouth 2 (two) times daily. 1 TAB TWICE A DAY      . OVER THE COUNTER MEDICATION Take 1 tablet by mouth daily. Over the counter probiotic (not align)      . simvastatin (ZOCOR) 20 MG tablet Take 1 tablet (20 mg total) by mouth daily.  90 tablet  3  . Tamsulosin HCl (FLOMAX) 0.4 MG CAPS Take 0.4 mg by mouth daily.        No current facility-administered medications for this visit.    Past Medical History  Diagnosis Date  . BARRETTS ESOPHAGUS 11/19/2007  . CARDIOMYOPATHY 08/02/2008  . CHANGE IN BOWELS 02/01/2009  . CHF 08/10/2008  . Diarrhea 01/31/2009  . DIVERTICULOSIS, COLON 11/19/2007  . GASTRITIS 11/19/2007  . GERD 11/19/2007  . HIATAL HERNIA  11/19/2007  . HYPERLIPIDEMIA 06/27/2009  . HYPOTHYROIDISM 06/27/2009  . MELENA 08/01/2009  . PERSONAL HX COLONIC POLYPS 02/01/2009  . TINEA CORPORIS 06/27/2009  . TOBACCO USE, QUIT 06/27/2009  . TREMOR, ESSENTIAL 06/27/2009  . HTN (hypertension)   . IBS (irritable bowel syndrome)   . Heart murmur   . Blood transfusion without reported diagnosis     Past Surgical History  Procedure Laterality Date  . Cholecystectomy    . Lung surgery  1984  . Umbilical hernia repair    . Tonsillectomy    . Vasectomy    . Esophagogastroduodenoscopy N/A 04/28/2013    Procedure: ESOPHAGOGASTRODUODENOSCOPY (EGD);  Surgeon: Jerene Bears, MD;  Location: [facility];  Service: Gastroenterology;  Laterality: N/A;  . Tonsillectomy    . Hernia repair      Family History  Problem Relation Age of Onset  . Heart disease Mother   . Colon cancer Father   . Prostate cancer Brother     History   Social History  . Marital Status: Widowed    Spouse Name: N/A    Number of Children: N/A  . Years of Education: N/A   Occupational History  . retired Clinical biochemist    Social History Main Topics  . Smoking status: Former Smoker -- 3.00 packs/day for 24 years    Types: Cigarettes  Quit date: 03/27/1943  . Smokeless tobacco: Never Used     Comment: Widowed x 2, has lady friend  . Alcohol Use: No     Comment: occassional  . Drug Use: No  . Sexual Activity: No   Other Topics Concern  . Not on file   Social History Narrative  . No narrative on file    Review of Systems:  All systems reviewed.  They are negative to the above problem except as previously stated.  Vital Signs: BP 124/71  Pulse 70  Ht 5\' 4"  (1.626 m)  Wt 151 lb (68.493 kg)  BMI 25.91 kg/m2  Physical Exam Patient is in NAD HEENT:  Normocephalic, atraumatic. EOMI, PERRLA.  Neck: JVP is mildly increased  No bruits.  Lungs: clear to auscultation. No rales no wheezes.  Heart: Regular rate and rhythm. Normal S1, S2. No S3.   No significant  murmurs. PMI not displaced.  Abdomen:  Supple, nontender. Normal bowel sounds. No masses. No hepatomegaly.  Extremities:   Good distal pulses throughout. 1+ lower extremity edema.  Musculoskeletal :moving all extremities.  Neuro:   alert and oriented x3.  CN II-XII grossly intact.  EKG Assessment and Plan:   1  Cardiomyopathy  Mild LV dysfunciton on echo  Volume appears to be increase, though mild  Patient is comfortable.  Will check labs to assist in diuresis.   2.  HTN Good control  3. Atrial fib.  Patient has not had in past.  HR is regular, does not appear to be in afib  Follow    Encouraged him to keep walking  Follow up next summer

## 2013-08-31 NOTE — Patient Instructions (Signed)
Your physician recommends that you continue on your current medications as directed. Please refer to the Current Medication list given to you today. Your physician recommends that you return for lab work in: TODAY.  

## 2013-09-01 ENCOUNTER — Telehealth: Payer: Self-pay | Admitting: *Deleted

## 2013-09-01 DIAGNOSIS — I10 Essential (primary) hypertension: Secondary | ICD-10-CM

## 2013-09-01 DIAGNOSIS — I5022 Chronic systolic (congestive) heart failure: Secondary | ICD-10-CM

## 2013-09-01 LAB — BASIC METABOLIC PANEL
BUN: 23 mg/dL (ref 6–23)
CO2: 29 meq/L (ref 19–32)
CREATININE: 1.1 mg/dL (ref 0.4–1.5)
Calcium: 9 mg/dL (ref 8.4–10.5)
Chloride: 104 mEq/L (ref 96–112)
GFR: 68.29 mL/min (ref >60.00)
GLUCOSE: 112 mg/dL — AB (ref 70–99)
Potassium: 3.9 mEq/L (ref 3.5–5.1)
SODIUM: 139 meq/L (ref 135–145)

## 2013-09-01 LAB — CBC
HEMATOCRIT: 37.7 % — AB (ref 39.0–52.0)
Hemoglobin: 12.5 g/dL — ABNORMAL LOW (ref 13.0–17.0)
MCHC: 33.3 g/dL (ref 30.0–36.0)
MCV: 90.7 fl (ref 78.0–100.0)
Platelets: 256 10*3/uL (ref 150.0–400.0)
RBC: 4.16 Mil/uL — AB (ref 4.22–5.81)
RDW: 15.8 % — ABNORMAL HIGH (ref 11.5–15.5)
WBC: 8.2 10*3/uL (ref 4.0–10.5)

## 2013-09-01 LAB — BRAIN NATRIURETIC PEPTIDE: Pro B Natriuretic peptide (BNP): 66 pg/mL (ref 0.0–100.0)

## 2013-09-01 NOTE — Telephone Encounter (Signed)
Left message at facility where patient lives for him to call back tomorrow am

## 2013-09-02 NOTE — Telephone Encounter (Signed)
Patient walked in office wanting lab results.09/01/13 lab results given.Advised to watch salt intake.Advised he can take lasix 40 mg 2 days per week and 20 mg other days.Bmet scheduled 09/29/13.

## 2013-09-02 NOTE — Telephone Encounter (Signed)
Follow up     Pt says he is returning Dr Alan Ripper call

## 2013-09-02 NOTE — Telephone Encounter (Signed)
Patient is returning call back regarding lab work, please call back at [phone].

## 2013-09-02 NOTE — Telephone Encounter (Signed)
Returned call to patient.  Left message for patient to call back. 

## 2013-09-29 ENCOUNTER — Other Ambulatory Visit (INDEPENDENT_AMBULATORY_CARE_PROVIDER_SITE_OTHER): Payer: Medicare Other

## 2013-09-29 DIAGNOSIS — I5022 Chronic systolic (congestive) heart failure: Secondary | ICD-10-CM

## 2013-09-29 DIAGNOSIS — I10 Essential (primary) hypertension: Secondary | ICD-10-CM

## 2013-09-29 LAB — BASIC METABOLIC PANEL
BUN: 28 mg/dL — ABNORMAL HIGH (ref 6–23)
CALCIUM: 9.3 mg/dL (ref 8.4–10.5)
CO2: 32 mEq/L (ref 19–32)
Chloride: 102 mEq/L (ref 96–112)
Creatinine, Ser: 1.2 mg/dL (ref 0.4–1.5)
GFR: 58.76 mL/min — ABNORMAL LOW (ref >60.00)
GLUCOSE: 117 mg/dL — AB (ref 70–99)
Potassium: 4 mEq/L (ref 3.5–5.1)
Sodium: 138 mEq/L (ref 135–145)

## 2013-10-01 ENCOUNTER — Other Ambulatory Visit: Payer: Self-pay

## 2013-10-01 MED ORDER — FUROSEMIDE 20 MG PO TABS: ORAL_TABLET | ORAL | Status: DC

## 2013-10-06 ENCOUNTER — Other Ambulatory Visit: Payer: Self-pay

## 2013-10-06 ENCOUNTER — Encounter (INDEPENDENT_AMBULATORY_CARE_PROVIDER_SITE_OTHER): Payer: Medicare Other | Admitting: Ophthalmology

## 2013-10-06 ENCOUNTER — Telehealth: Payer: Self-pay | Admitting: *Deleted

## 2013-10-06 DIAGNOSIS — H43819 Vitreous degeneration, unspecified eye: Secondary | ICD-10-CM

## 2013-10-06 DIAGNOSIS — I10 Essential (primary) hypertension: Secondary | ICD-10-CM

## 2013-10-06 DIAGNOSIS — H353290 Exudative age-related macular degeneration, unspecified eye, stage unspecified: Secondary | ICD-10-CM

## 2013-10-06 DIAGNOSIS — H35039 Hypertensive retinopathy, unspecified eye: Secondary | ICD-10-CM

## 2013-10-06 DIAGNOSIS — H3530 Unspecified macular degeneration: Secondary | ICD-10-CM

## 2013-10-06 MED ORDER — FUROSEMIDE 20 MG PO TABS: ORAL_TABLET | ORAL | Status: DC

## 2013-10-06 NOTE — Telephone Encounter (Signed)
Advised patient of lab results. Patient swelling improved but son noticed some swelling on Sunday and he started back on Lasix 20 mg Monday. Patient stated that he had been taking the Lasix daily up until about Wednesday or Thursday of last week, he was unsure of exactly when he discontinued it. Patient does check his blood pressure three times a week at the [facility] where he walks 1-1 &1/2 miles in about 17 minutes and participates in silver sneakers.  Blood pressure has been running in the 90's/60's. Stated he felt great, denied any dizziness or lightheadedness. Will forward to Dr Harrington Challenger for review.

## 2013-10-06 NOTE — Telephone Encounter (Signed)
Message copied by Earvin Hansen on Tue Oct 06, 2013  8:39 AM ------      Message from: Dorris Carnes V      Created: Wed Sep 30, 2013 11:28 PM       Labs look good.  HOw is his swelling  Has it improved?        If still a problem could take higher lasix dose every otther day instead of 2x per wk      If does this then he should have BMET and BNP in 2 wks. ------

## 2013-10-07 ENCOUNTER — Ambulatory Visit (INDEPENDENT_AMBULATORY_CARE_PROVIDER_SITE_OTHER): Payer: Medicare Other | Admitting: Family Medicine

## 2013-10-07 VITALS — BP 96/62 | HR 78 | Temp 97.5°F | Resp 18 | Ht 65.0 in | Wt 156.0 lb

## 2013-10-07 DIAGNOSIS — S90569A Insect bite (nonvenomous), unspecified ankle, initial encounter: Secondary | ICD-10-CM

## 2013-10-07 DIAGNOSIS — W57XXXA Bitten or stung by nonvenomous insect and other nonvenomous arthropods, initial encounter: Secondary | ICD-10-CM

## 2013-10-07 DIAGNOSIS — R609 Edema, unspecified: Secondary | ICD-10-CM

## 2013-10-07 DIAGNOSIS — S80861A Insect bite (nonvenomous), right lower leg, initial encounter: Secondary | ICD-10-CM

## 2013-10-07 MED ORDER — FUROSEMIDE 20 MG PO TABS: ORAL_TABLET | ORAL | Status: DC

## 2013-10-07 MED ORDER — TRIAMCINOLONE ACETONIDE 0.1 % EX CREA: 1.0000 "application " | TOPICAL_CREAM | Freq: Three times a day (TID) | CUTANEOUS | Status: DC

## 2013-10-07 NOTE — Progress Notes (Signed)
Subjective: Patient is here for several things. He wants a followup on his Lasix. He feels like he is better on. He also has something bothering him at findings calf of his right leg. He wonders when he got an insect bite there. He's not been having any excessive shortness of breath. His exertion is good. He has now moved to [place] home.  Objective:  No acute distress. Chest clear. Heart regular without murmurs. He has 1-2+ edema of his ankles, which is apparently less than feels like it had. He has a tick attached his right popliteal fossa. This was extracted with tweezers intact. It was still alive and tried to crawl away. It was disposed of.  Assessment: Tick bite Edema  Plan: Cautioned him about any illness that could be related to the tick bite. Antibiotics were not indicated today. Gave a little triamcinolone cream to use on the bite.

## 2013-10-07 NOTE — Patient Instructions (Signed)
Use triamcinolone cream on the tick bite 3 times daily for the redness and itching. You can use it on those other possible bites also.  If you get ill with fever, rash, headache, or other illness symptoms within the next 3 weeks you should probably come in because tick bites occasionally can cause some other significant infections.  Continue taking the fluid pill one every other day. I have called in one prescription to the local pharmacy and send a longer term prescription in to your mail off pharmacy.

## 2013-10-08 ENCOUNTER — Other Ambulatory Visit: Payer: Self-pay

## 2013-10-08 DIAGNOSIS — R609 Edema, unspecified: Secondary | ICD-10-CM

## 2013-10-08 MED ORDER — FUROSEMIDE 20 MG PO TABS: ORAL_TABLET | ORAL | Status: DC

## 2013-10-09 NOTE — Telephone Encounter (Signed)
Scheduled appointment. Patient stated he had an echo scheduled next week per print out given to him at PCP. Appointment note states per [facility] MD, patient denies seeing any [place] doctors. Advised patient to discuss with Dr Harrington Challenger in August at his appointment since echo done in April, no need to keep appointment.   Set patient for f/u with me next time I am in clinic OK to overbook. ----- Message ----- From: Earvin Hansen Sent: 10/06/2013 8:39 AM To: Fay Records, MD

## 2013-10-15 ENCOUNTER — Other Ambulatory Visit (HOSPITAL_COMMUNITY): Payer: Medicare Other

## 2013-10-26 ENCOUNTER — Ambulatory Visit (INDEPENDENT_AMBULATORY_CARE_PROVIDER_SITE_OTHER): Payer: Medicare Other | Admitting: Internal Medicine

## 2013-10-26 ENCOUNTER — Encounter: Payer: Self-pay | Admitting: Internal Medicine

## 2013-10-26 VITALS — BP 116/72 | HR 84 | Ht 65.0 in | Wt 153.0 lb

## 2013-10-26 DIAGNOSIS — I429 Cardiomyopathy, unspecified: Secondary | ICD-10-CM

## 2013-10-26 DIAGNOSIS — I10 Essential (primary) hypertension: Secondary | ICD-10-CM

## 2013-10-26 DIAGNOSIS — I428 Other cardiomyopathies: Secondary | ICD-10-CM

## 2013-10-26 LAB — CBC WITH DIFFERENTIAL/PLATELET
BASOS ABS: 0 10*3/uL (ref 0.0–0.1)
Basophils Relative: 0.6 % (ref 0.0–3.0)
EOS ABS: 0.2 10*3/uL (ref 0.0–0.7)
Eosinophils Relative: 2.4 % (ref 0.0–5.0)
HCT: 42 % (ref 39.0–52.0)
Hemoglobin: 13.9 g/dL (ref 13.0–17.0)
LYMPHS PCT: 17.2 % (ref 12.0–46.0)
Lymphs Abs: 1.4 10*3/uL (ref 0.7–4.0)
MCHC: 33.1 g/dL (ref 30.0–36.0)
MCV: 91.1 fl (ref 78.0–100.0)
MONOS PCT: 9.2 % (ref 3.0–12.0)
Monocytes Absolute: 0.7 10*3/uL (ref 0.1–1.0)
NEUTROS PCT: 70.6 % (ref 43.0–77.0)
Neutro Abs: 5.7 10*3/uL (ref 1.4–7.7)
Platelets: 234 10*3/uL (ref 150.0–400.0)
RBC: 4.61 Mil/uL (ref 4.22–5.81)
RDW: 13.9 % (ref 11.5–15.5)
WBC: 8 10*3/uL (ref 4.0–10.5)

## 2013-10-26 LAB — BASIC METABOLIC PANEL
BUN: 25 mg/dL — ABNORMAL HIGH (ref 6–23)
CHLORIDE: 101 meq/L (ref 96–112)
CO2: 25 meq/L (ref 19–32)
Calcium: 9 mg/dL (ref 8.4–10.5)
Creatinine, Ser: 1.3 mg/dL (ref 0.4–1.5)
GFR: 54.63 mL/min — ABNORMAL LOW (ref >60.00)
Glucose, Bld: 84 mg/dL (ref 70–99)
Potassium: 4.1 mEq/L (ref 3.5–5.1)
Sodium: 135 mEq/L (ref 135–145)

## 2013-10-26 LAB — BRAIN NATRIURETIC PEPTIDE: PRO B NATRI PEPTIDE: 67 pg/mL (ref 0.0–100.0)

## 2013-10-26 NOTE — Patient Instructions (Signed)
Your physician recommends that you continue on your current medications as directed. Please refer to the Current Medication list given to you today.  Labs today: BMET, CBC, BNP

## 2013-10-26 NOTE — Progress Notes (Signed)
HPI HPIPatient is an 80 year old with a history of presumed NICM (norml perfusion on myoview), hypertension and dyslipidemia  The patient was admittted in April pneumonia/sepsis.  He was in atrial fibrillation duing admit.  Note echo done during admit LVEF was 45%   I saw him in clinic in June He  Was seen by Gean Birchwood in June  Found to have some LE edema as well as tick on leg(alive)  Allergies  Allergen Reactions  . Ciprofloxacin     Questionable allergy as pt reports taking med without difficulty for resp infection  . Penicillins Rash    Current Outpatient Prescriptions  Medication Sig Dispense Refill  . esomeprazole (NEXIUM) 40 MG capsule Take 1 capsule (40 mg total) by mouth daily at 12 noon.  30 capsule  6  . feeding supplement, ENSURE COMPLETE, (ENSURE COMPLETE) LIQD Take 237 mLs by mouth at bedtime.  30 Bottle  1  . furosemide (LASIX) 20 MG tablet Take 40 mg alternating with 20 mg a day  99 tablet  1  . lisinopril-hydrochlorothiazide (PRINZIDE,ZESTORETIC) 10-12.5 MG per tablet Take 1 tablet by mouth daily with breakfast.  90 tablet  3  . metoprolol succinate (TOPROL-XL) 25 MG 24 hr tablet Take 1 tablet (25 mg total) by mouth daily with breakfast.  90 tablet  3  . Multiple Vitamins-Minerals (PRESERVISION AREDS 2 PO) Take 1 tablet by mouth 2 (two) times daily. 1 TAB TWICE A DAY      . OVER THE COUNTER MEDICATION Take 1 tablet by mouth daily. Over the counter probiotic (not align)      . simvastatin (ZOCOR) 20 MG tablet Take 1 tablet (20 mg total) by mouth daily.  90 tablet  3  . Tamsulosin HCl (FLOMAX) 0.4 MG CAPS Take 0.4 mg by mouth daily.       Marland Kitchen triamcinolone cream (KENALOG) 0.1 % Apply 1 application topically 3 (three) times daily.  15 g  0   No current facility-administered medications for this visit.    Past Medical History  Diagnosis Date  . BARRETTS ESOPHAGUS 11/19/2007  . CARDIOMYOPATHY 08/02/2008  . CHANGE IN BOWELS 02/01/2009  . CHF 08/10/2008  . Diarrhea 01/31/2009   . DIVERTICULOSIS, COLON 11/19/2007  . GASTRITIS 11/19/2007  . GERD 11/19/2007  . HIATAL HERNIA 11/19/2007  . HYPERLIPIDEMIA 06/27/2009  . HYPOTHYROIDISM 06/27/2009  . MELENA 08/01/2009  . PERSONAL HX COLONIC POLYPS 02/01/2009  . TINEA CORPORIS 06/27/2009  . TOBACCO USE, QUIT 06/27/2009  . TREMOR, ESSENTIAL 06/27/2009  . HTN (hypertension)   . IBS (irritable bowel syndrome)   . Heart murmur   . Blood transfusion without reported diagnosis     Past Surgical History  Procedure Laterality Date  . Cholecystectomy    . Lung surgery  1984  . Umbilical hernia repair    . Tonsillectomy    . Vasectomy    . Esophagogastroduodenoscopy N/A 04/28/2013    Procedure: ESOPHAGOGASTRODUODENOSCOPY (EGD);  Surgeon: Jerene Bears, MD;  Location: [facility];  Service: Gastroenterology;  Laterality: N/A;  . Tonsillectomy    . Hernia repair      Family History  Problem Relation Age of Onset  . Heart disease Mother   . Colon cancer Father   . Prostate cancer Brother     History   Social History  . Marital Status: Widowed    Spouse Name: N/A    Number of Children: N/A  . Years of Education: N/A   Occupational History  . retired  electrician    Social History Main Topics  . Smoking status: Former Smoker -- 3.00 packs/day for 24 years    Types: Cigarettes    Quit date: 03/27/1943  . Smokeless tobacco: Never Used     Comment: Widowed x 2, has lady friend  . Alcohol Use: No     Comment: occassional  . Drug Use: No  . Sexual Activity: No   Other Topics Concern  . Not on file   Social History Narrative  . No narrative on file    Review of Systems:  All systems reviewed.  They are negative to the above problem except as previously stated.  Vital Signs: BP 116/72  Pulse 84  Ht 5\' 5"  (1.651 m)  Wt 153 lb (69.4 kg)  BMI 25.46 kg/m2  Physical Exam Patient is in NAD HEENT:  Normocephalic, atraumatic. EOMI, PERRLA. Lungs: clear to auscultation. No rales no wheezes.  Heart: Regular rate and  rhythm. Normal S1, S2. No S3.   No significant murmurs. PMI not displaced.  Abdomen:  Supple, nontender. Normal bowel sounds. No masses. No hepatomegaly.  Extremities:   Good distal pulses throughout. Tr lower extremity edema.  Musculoskeletal :moving all extremities.  Neuro:   alert and oriented x3.  CN II-XII grossly intact.  EKG  SR 84 bpm  Poor R wave progression.  Assessment and Plan:   1  Cardiomyopathy  Mild LV dysfunciton on echo  Volume overall appears pretty good  Minimally increased  Patient is comfortable.  Will check labs    2.  HTN Good control   3. Atrial fib.  Currently in SR   Follow    Encouraged him to keep walking  Follow up in  winter

## 2013-11-02 ENCOUNTER — Ambulatory Visit (INDEPENDENT_AMBULATORY_CARE_PROVIDER_SITE_OTHER): Payer: Medicare Other | Admitting: Internal Medicine

## 2013-11-02 ENCOUNTER — Ambulatory Visit (INDEPENDENT_AMBULATORY_CARE_PROVIDER_SITE_OTHER): Payer: Medicare Other

## 2013-11-02 VITALS — BP 122/76 | HR 69 | Temp 97.5°F | Resp 16 | Ht 65.0 in | Wt 155.0 lb

## 2013-11-02 DIAGNOSIS — M25559 Pain in unspecified hip: Secondary | ICD-10-CM

## 2013-11-02 DIAGNOSIS — R269 Unspecified abnormalities of gait and mobility: Secondary | ICD-10-CM

## 2013-11-02 DIAGNOSIS — W19XXXA Unspecified fall, initial encounter: Secondary | ICD-10-CM

## 2013-11-02 DIAGNOSIS — M25552 Pain in left hip: Secondary | ICD-10-CM

## 2013-11-02 NOTE — Progress Notes (Signed)
   Subjective:    Patient ID: [name], male    DOB: Jan 07, 1922, 80 y.o.   MRN: [id number]  HPI  80 y/o male complains of hip pain from a fall on Sunday 8-9 at home pt lives at [facility].  left side hip pain with movement. Patient has  Abnormal gait.      No pain to touch.   Putting pants on while standing and foot caught in the fabric and he fell on left hip. Using walker, normally walks without walker Family PCP Dr. Lorelei Pont  Has hearing loss.  Review of Systems     Objective:   Physical Exam  Constitutional: He is oriented to person, place, and time. He appears well-developed and well-nourished. No distress.  HENT:  Head: Normocephalic.  Eyes: EOM are normal.  Neck: Normal range of motion.  Pulmonary/Chest: Effort normal and breath sounds normal.  Musculoskeletal:       Left hip: He exhibits decreased range of motion, tenderness and bony tenderness. He exhibits normal strength, no swelling, no crepitus, no deformity and no laceration.  Neurological: He is alert and oriented to person, place, and time. He has normal strength. No sensory deficit. Coordination and gait abnormal.  Skin: Skin is intact. Ecchymosis noted. No laceration noted. No erythema.  Psychiatric: He has a normal mood and affect. His behavior is normal.   [facility] reading (PRIMARY) by  Dr.Nickola Lenig no fx seen, hx suggests fxas does pain         Assessment & Plan:  Use walker/No weight bearing Tylenol/RICE

## 2013-11-02 NOTE — Patient Instructions (Signed)
Hip Pain Your hip is the joint between your upper legs and your lower pelvis. The bones, cartilage, tendons, and muscles of your hip joint perform a lot of work each day supporting your body weight and allowing you to move around. Hip pain can range from a minor ache to severe pain in one or both of your hips. Pain may be felt on the inside of the hip joint near the groin, or the outside near the buttocks and upper thigh. You may have swelling or stiffness as well.  HOME CARE INSTRUCTIONS   Take medicines only as directed by your health care provider.  Apply ice to the injured area:  Put ice in a plastic bag.  Place a towel between your skin and the bag.  Leave the ice on for 15-20 minutes at a time, 3-4 times a day.  Keep your leg raised (elevated) when possible to lessen swelling.  Avoid activities that cause pain.  Follow specific exercises as directed by your health care provider.  Sleep with a pillow between your legs on your most comfortable side.  Record how often you have hip pain, the location of the pain, and what it feels like. SEEK MEDICAL CARE IF:   You are unable to put weight on your leg.  Your hip is red or swollen or very tender to touch.  Your pain or swelling continues or worsens after 1 week.  You have increasing difficulty walking.  You have a fever. SEEK IMMEDIATE MEDICAL CARE IF:   You have fallen.  You have a sudden increase in pain and swelling in your hip. MAKE SURE YOU:   Understand these instructions.  Will watch your condition.  Will get help right away if you are not doing well or get worse. Document Released: 08/30/2009 Document Revised: 07/27/2013 Document Reviewed: 11/06/2012 ExitCare Patient Information 2015 [employer]. This information is not intended to replace advice given to you by your health care provider. Make sure you discuss any questions you have with your health care provider.  

## 2013-11-06 ENCOUNTER — Encounter: Payer: Self-pay | Admitting: Internal Medicine

## 2013-11-12 ENCOUNTER — Encounter: Payer: Self-pay | Admitting: Internal Medicine

## 2013-11-17 ENCOUNTER — Ambulatory Visit (AMBULATORY_SURGERY_CENTER): Payer: Self-pay | Admitting: *Deleted

## 2013-11-17 VITALS — Ht 65.0 in | Wt 157.0 lb

## 2013-11-17 DIAGNOSIS — K2270 Barrett's esophagus without dysplasia: Secondary | ICD-10-CM

## 2013-11-17 NOTE — Progress Notes (Signed)
No egg or soy allergy. No anesthesia problems.  No diet meds.  No home O2.  Did not give emmi-pt does not have computer.

## 2013-11-23 ENCOUNTER — Telehealth: Payer: Self-pay

## 2013-11-23 ENCOUNTER — Telehealth: Payer: Self-pay | Admitting: Internal Medicine

## 2013-11-23 NOTE — Telephone Encounter (Signed)
Tiffany with [facility] is wanting to know if dr guest would order quality mobile to come out to do a hip xray on this patient  [phone] tiffany physical therapist

## 2013-11-23 NOTE — Telephone Encounter (Signed)
Ok to cancel procedure with no charge Have pt schedule OV with me for follow-up

## 2013-11-23 NOTE — Telephone Encounter (Signed)
Called patient back and advised him that Dr. Hilarie Fredrickson would not charge him a cancellation fee and wants him to schedule office visit for follow-up.  Patient states that he would call back and schedule the appointment because he was in bed taking a nap.

## 2013-11-23 NOTE — Telephone Encounter (Signed)
Tiffany with [facility] is wanting to know if dr guest would order quality mobile to come out and xray patient hip in severe pain   Best number [phone] tiffany -pt physical therpy

## 2013-11-23 NOTE — Telephone Encounter (Signed)
Called patient and he is concerned about not being able to stand and walk.  He states that he is in a wheelchair and cannot walk.  He c/o of feeling really weak and is concerned about being charged if he cancels.  I advised patient that I would ask Dr. Hilarie Fredrickson and call him back.  Called Genella Mech, CMA and she will ask Dr. Hilarie Fredrickson and let me know.

## 2013-11-24 ENCOUNTER — Ambulatory Visit (INDEPENDENT_AMBULATORY_CARE_PROVIDER_SITE_OTHER): Payer: Medicare Other | Admitting: Internal Medicine

## 2013-11-24 ENCOUNTER — Encounter: Payer: Medicare Other | Admitting: Internal Medicine

## 2013-11-24 VITALS — BP 118/68 | HR 80 | Temp 99.0°F | Resp 17

## 2013-11-24 DIAGNOSIS — M25559 Pain in unspecified hip: Secondary | ICD-10-CM

## 2013-11-24 DIAGNOSIS — S72002A Fracture of unspecified part of neck of left femur, initial encounter for closed fracture: Secondary | ICD-10-CM

## 2013-11-24 DIAGNOSIS — M25552 Pain in left hip: Secondary | ICD-10-CM

## 2013-11-24 DIAGNOSIS — S72009A Fracture of unspecified part of neck of unspecified femur, initial encounter for closed fracture: Secondary | ICD-10-CM

## 2013-11-24 DIAGNOSIS — W19XXXA Unspecified fall, initial encounter: Secondary | ICD-10-CM

## 2013-11-24 MED ORDER — HYDROCODONE-ACETAMINOPHEN 5-325 MG PO TABS: ORAL_TABLET | ORAL | Status: DC

## 2013-11-24 NOTE — Telephone Encounter (Signed)
Tiffany wants to know if a mobile x-ray can be performed on this patient.   [phone]

## 2013-11-24 NOTE — Telephone Encounter (Signed)
Pt is having increased pt is non weight bearing and unable to get into the office.  Tiffany is going back to pt home to assist girlfriend to bring pt into the office to see Dr. Elder Cyphers today. Pt was suppose to follow up around 8/17 and did not come in.

## 2013-11-24 NOTE — Progress Notes (Signed)
   Subjective:    Patient ID: [name], male    DOB: 22-Feb-1922, 80 y.o.   MRN: [id number]  HPI 80 year old man presents today with pain in left leg. Says he can not walk. Was in here about 2 weeks ago with the same issue. Went to a chiropractor the day after last visit he said he received shots in the leg. Leg pain has improved. Was wondering what to take for pain. The pain is not severe its more like an "irritation pain". The leg is bother him from the top of the thigh to the knee. Has not taking any medicine for the pain. He's been using cold compresses.   Prior to fall 2 weeks ago he was able to walk without assistance, now he has to use a wheel chair. He was supposed to return and see me if pain persisted, he took 2 weeks to return.   Review of Systems     Objective:   Physical Exam  Constitutional: He is oriented to person, place, and time. He appears well-nourished. He appears distressed.  HENT:  Head: Normocephalic.  Eyes: EOM are normal. No scleral icterus.  Neck: Normal range of motion.  Cardiovascular: Normal rate.   Pulmonary/Chest: Effort normal.  Musculoskeletal:       Left hip: He exhibits decreased range of motion, decreased strength, tenderness and bony tenderness. He exhibits no swelling, no crepitus, no deformity and no laceration.  Cannot walk, is in a wheel chair.  Neurological: He is alert and oriented to person, place, and time. No cranial nerve deficit or sensory deficit. He exhibits normal muscle tone. Coordination and gait abnormal.  Hearing loss   Emergently made appointment for MRI scan       Assessment & Plan:  Probable Fractured  Left hip/femur 38 weeks old Refer for MRI/Refer for orthopedist care Vicodin 5/325 1/2 prn pain  Will refer to Dr. Verne Spurr after scan

## 2013-11-24 NOTE — Patient Instructions (Signed)
Hip Fracture °A hip fracture is a fracture of the upper part of your thigh bone (femur).  °CAUSES °A hip fracture is caused by a direct blow to the side of your hip. This is usually the result of a fall but can occur in other circumstances, such as an automobile accident. °RISK FACTORS °There is an increased risk of hip fractures in people with: °· An unsteady walking pattern (gait) and those with conditions that contribute to poor balance, such as Parkinson's disease or dementia. °· Osteopenia and osteoporosis. °· Cancer that spreads to the leg bones. °· Certain metabolic diseases. °SYMPTOMS  °Symptoms of hip fracture include: °· Pain over the injured hip. °· Inability to put weight on the leg in which the fracture occurred (although, some patients are able to walk after a hip fracture). °· Toes and foot of the affected leg point outward when you lie down. °DIAGNOSIS °A physical exam can determine if a hip fracture is likely to have occurred. X-ray exams are needed to confirm the fracture and to look for other injuries. The X-ray exam can help to determine the type of hip fracture. Rarely, the fracture is not visible on an X-ray image and a CT scan or MRI will have to be done. °TREATMENT  °The treatment for a fracture is usually surgery. This means using a screw, nail, or rod to hold the bones in place.  °HOME CARE INSTRUCTIONS °Take all medicines as directed by your health care provider. °SEEK MEDICAL CARE IF: °Pain continues, even after taking pain medicine. °MAKE SURE YOU: °· Understand these instructions.   °· Will watch your condition. °· Will get help right away if you are not doing well or get worse. °Document Released: 03/12/2005 Document Revised: 03/17/2013 Document Reviewed: 10/22/2012 °ExitCare® Patient Information ©2015 [employer]. This information is not intended to replace advice given to you by your health care provider. Make sure you discuss any questions you have with your health care  provider. ° °

## 2013-11-25 ENCOUNTER — Other Ambulatory Visit: Payer: Self-pay | Admitting: Internal Medicine

## 2013-11-25 DIAGNOSIS — W19XXXA Unspecified fall, initial encounter: Secondary | ICD-10-CM

## 2013-11-25 DIAGNOSIS — M25552 Pain in left hip: Secondary | ICD-10-CM

## 2013-11-25 DIAGNOSIS — S72002A Fracture of unspecified part of neck of left femur, initial encounter for closed fracture: Secondary | ICD-10-CM

## 2013-11-26 ENCOUNTER — Ambulatory Visit (HOSPITAL_COMMUNITY)
Admission: RE | Admit: 2013-11-26 | Discharge: 2013-11-26 | Disposition: A | Discharge status: Home / Self Care | Payer: Medicare Other | Source: Ambulatory Visit | Attending: Internal Medicine | Admitting: Internal Medicine

## 2013-11-26 DIAGNOSIS — X58XXXA Exposure to other specified factors, initial encounter: Secondary | ICD-10-CM | POA: Insufficient documentation

## 2013-11-26 DIAGNOSIS — S72009A Fracture of unspecified part of neck of unspecified femur, initial encounter for closed fracture: Secondary | ICD-10-CM | POA: Insufficient documentation

## 2013-11-26 DIAGNOSIS — M25552 Pain in left hip: Secondary | ICD-10-CM

## 2013-11-26 DIAGNOSIS — W19XXXA Unspecified fall, initial encounter: Secondary | ICD-10-CM

## 2013-11-26 DIAGNOSIS — M79609 Pain in unspecified limb: Secondary | ICD-10-CM | POA: Diagnosis present

## 2013-11-26 DIAGNOSIS — M47817 Spondylosis without myelopathy or radiculopathy, lumbosacral region: Secondary | ICD-10-CM | POA: Insufficient documentation

## 2013-11-26 DIAGNOSIS — Y929 Unspecified place or not applicable: Secondary | ICD-10-CM | POA: Diagnosis not present

## 2013-11-26 DIAGNOSIS — S72002A Fracture of unspecified part of neck of left femur, initial encounter for closed fracture: Secondary | ICD-10-CM

## 2013-11-26 MED ORDER — GADOBENATE DIMEGLUMINE 529 MG/ML IV SOLN
15.0000 mL | Freq: Once | INTRAVENOUS | Status: AC | PRN
  Administered 2013-11-26: 15 mL via INTRAVENOUS

## 2013-11-28 ENCOUNTER — Telehealth: Payer: Self-pay | Admitting: Family Medicine

## 2013-11-28 DIAGNOSIS — S72002A Fracture of unspecified part of neck of left femur, initial encounter for closed fracture: Secondary | ICD-10-CM

## 2013-11-28 NOTE — Telephone Encounter (Signed)
Per Dr. Elder Cyphers needs urgent referral L hip fx (L femoral neck) [facility] Tuesday 12/01/13. With Dr. Mayer Camel, Dr. Rhona Raider, or Dr. Berenice Primas. Referral placed in epic

## 2013-11-30 ENCOUNTER — Encounter (HOSPITAL_COMMUNITY): Payer: Self-pay | Admitting: Emergency Medicine

## 2013-11-30 ENCOUNTER — Encounter (HOSPITAL_COMMUNITY)
Admission: EM | Disposition: A | Discharge status: Skilled Nursing Facility | Payer: Self-pay | Source: Home / Self Care | Attending: Internal Medicine

## 2013-11-30 ENCOUNTER — Emergency Department (HOSPITAL_COMMUNITY): Payer: Medicare Other

## 2013-11-30 ENCOUNTER — Inpatient Hospital Stay (HOSPITAL_COMMUNITY): Payer: Medicare Other | Admitting: Anesthesiology

## 2013-11-30 ENCOUNTER — Encounter (HOSPITAL_COMMUNITY): Payer: Medicare Other | Admitting: Anesthesiology

## 2013-11-30 ENCOUNTER — Inpatient Hospital Stay (HOSPITAL_COMMUNITY)
Admission: EM | Admit: 2013-11-30 | Discharge: 2013-12-04 | DRG: 469 | Disposition: A | Discharge status: Skilled Nursing Facility | Payer: Medicare Other | Attending: Internal Medicine | Admitting: Internal Medicine

## 2013-11-30 DIAGNOSIS — I428 Other cardiomyopathies: Secondary | ICD-10-CM | POA: Diagnosis present

## 2013-11-30 DIAGNOSIS — M25559 Pain in unspecified hip: Secondary | ICD-10-CM | POA: Diagnosis present

## 2013-11-30 DIAGNOSIS — Z8249 Family history of ischemic heart disease and other diseases of the circulatory system: Secondary | ICD-10-CM

## 2013-11-30 DIAGNOSIS — K219 Gastro-esophageal reflux disease without esophagitis: Secondary | ICD-10-CM | POA: Diagnosis present

## 2013-11-30 DIAGNOSIS — I129 Hypertensive chronic kidney disease with stage 1 through stage 4 chronic kidney disease, or unspecified chronic kidney disease: Secondary | ICD-10-CM | POA: Diagnosis present

## 2013-11-30 DIAGNOSIS — E785 Hyperlipidemia, unspecified: Secondary | ICD-10-CM | POA: Diagnosis present

## 2013-11-30 DIAGNOSIS — E871 Hypo-osmolality and hyponatremia: Secondary | ICD-10-CM | POA: Diagnosis present

## 2013-11-30 DIAGNOSIS — Z87891 Personal history of nicotine dependence: Secondary | ICD-10-CM

## 2013-11-30 DIAGNOSIS — N182 Chronic kidney disease, stage 2 (mild): Secondary | ICD-10-CM | POA: Diagnosis present

## 2013-11-30 DIAGNOSIS — K589 Irritable bowel syndrome without diarrhea: Secondary | ICD-10-CM | POA: Diagnosis present

## 2013-11-30 DIAGNOSIS — N184 Chronic kidney disease, stage 4 (severe): Secondary | ICD-10-CM

## 2013-11-30 DIAGNOSIS — I4891 Unspecified atrial fibrillation: Secondary | ICD-10-CM | POA: Diagnosis present

## 2013-11-30 DIAGNOSIS — S72009A Fracture of unspecified part of neck of unspecified femur, initial encounter for closed fracture: Principal | ICD-10-CM | POA: Diagnosis present

## 2013-11-30 DIAGNOSIS — I10 Essential (primary) hypertension: Secondary | ICD-10-CM | POA: Diagnosis present

## 2013-11-30 DIAGNOSIS — Z9181 History of falling: Secondary | ICD-10-CM

## 2013-11-30 DIAGNOSIS — Z800 Family history of malignant neoplasm of digestive organs: Secondary | ICD-10-CM | POA: Diagnosis not present

## 2013-11-30 DIAGNOSIS — W19XXXA Unspecified fall, initial encounter: Secondary | ICD-10-CM | POA: Diagnosis present

## 2013-11-30 DIAGNOSIS — I480 Paroxysmal atrial fibrillation: Secondary | ICD-10-CM

## 2013-11-30 DIAGNOSIS — E039 Hypothyroidism, unspecified: Secondary | ICD-10-CM | POA: Diagnosis present

## 2013-11-30 DIAGNOSIS — N179 Acute kidney failure, unspecified: Secondary | ICD-10-CM | POA: Diagnosis present

## 2013-11-30 DIAGNOSIS — S72002A Fracture of unspecified part of neck of left femur, initial encounter for closed fracture: Secondary | ICD-10-CM

## 2013-11-30 DIAGNOSIS — E43 Unspecified severe protein-calorie malnutrition: Secondary | ICD-10-CM | POA: Diagnosis present

## 2013-11-30 DIAGNOSIS — Z8042 Family history of malignant neoplasm of prostate: Secondary | ICD-10-CM

## 2013-11-30 DIAGNOSIS — S72002P Fracture of unspecified part of neck of left femur, subsequent encounter for closed fracture with malunion: Secondary | ICD-10-CM

## 2013-11-30 DIAGNOSIS — N189 Chronic kidney disease, unspecified: Secondary | ICD-10-CM

## 2013-11-30 DIAGNOSIS — D72829 Elevated white blood cell count, unspecified: Secondary | ICD-10-CM | POA: Diagnosis present

## 2013-11-30 DIAGNOSIS — I509 Heart failure, unspecified: Secondary | ICD-10-CM | POA: Diagnosis present

## 2013-11-30 DIAGNOSIS — K2270 Barrett's esophagus without dysplasia: Secondary | ICD-10-CM

## 2013-11-30 DIAGNOSIS — I5042 Chronic combined systolic (congestive) and diastolic (congestive) heart failure: Secondary | ICD-10-CM | POA: Diagnosis present

## 2013-11-30 HISTORY — PX: HIP ARTHROPLASTY: SHX981

## 2013-11-30 LAB — URINALYSIS, ROUTINE W REFLEX MICROSCOPIC
Bilirubin Urine: NEGATIVE
Glucose, UA: NEGATIVE mg/dL
HGB URINE DIPSTICK: NEGATIVE
KETONES UR: NEGATIVE mg/dL
Leukocytes, UA: NEGATIVE
Nitrite: NEGATIVE
PROTEIN: NEGATIVE mg/dL
Specific Gravity, Urine: 1.02 (ref 1.005–1.030)
UROBILINOGEN UA: 0.2 mg/dL (ref 0.0–1.0)
pH: 5 (ref 5.0–8.0)

## 2013-11-30 LAB — PROTIME-INR
INR: 0.99 (ref 0.00–1.49)
Prothrombin Time: 13.1 seconds (ref 11.6–15.2)

## 2013-11-30 LAB — COMPREHENSIVE METABOLIC PANEL
ALK PHOS: 107 U/L (ref 39–117)
ALT: 9 U/L (ref 0–53)
AST: 13 U/L (ref 0–37)
Albumin: 2.7 g/dL — ABNORMAL LOW (ref 3.5–5.2)
Anion gap: 16 — ABNORMAL HIGH (ref 5–15)
BUN: 51 mg/dL — ABNORMAL HIGH (ref 6–23)
CO2: 21 meq/L (ref 19–32)
Calcium: 7.8 mg/dL — ABNORMAL LOW (ref 8.4–10.5)
Chloride: 97 mEq/L (ref 96–112)
Creatinine, Ser: 1.63 mg/dL — ABNORMAL HIGH (ref 0.50–1.35)
GFR calc non Af Amer: 35 mL/min — ABNORMAL LOW (ref >90)
GFR, EST AFRICAN AMERICAN: 41 mL/min — AB (ref >90)
GLUCOSE: 101 mg/dL — AB (ref 70–99)
Potassium: 3.5 mEq/L — ABNORMAL LOW (ref 3.7–5.3)
SODIUM: 134 meq/L — AB (ref 137–147)
Total Bilirubin: 0.4 mg/dL (ref 0.3–1.2)
Total Protein: 6 g/dL (ref 6.0–8.3)

## 2013-11-30 LAB — SURGICAL PCR SCREEN
MRSA, PCR: NEGATIVE
STAPHYLOCOCCUS AUREUS: NEGATIVE

## 2013-11-30 LAB — LIPASE, BLOOD: Lipase: 24 U/L (ref 11–59)

## 2013-11-30 LAB — CBC
HEMATOCRIT: 33.9 % — AB (ref 39.0–52.0)
Hemoglobin: 11.6 g/dL — ABNORMAL LOW (ref 13.0–17.0)
MCH: 29.8 pg (ref 26.0–34.0)
MCHC: 34.2 g/dL (ref 30.0–36.0)
MCV: 87.1 fL (ref 78.0–100.0)
Platelets: 257 10*3/uL (ref 150–400)
RBC: 3.89 MIL/uL — AB (ref 4.22–5.81)
RDW: 12.6 % (ref 11.5–15.5)
WBC: 15.1 10*3/uL — ABNORMAL HIGH (ref 4.0–10.5)

## 2013-11-30 LAB — ABO/RH: ABO/RH(D): A POS

## 2013-11-30 LAB — I-STAT CG4 LACTIC ACID, ED: Lactic Acid, Venous: 0.65 mmol/L (ref 0.5–2.2)

## 2013-11-30 LAB — TYPE AND SCREEN
ABO/RH(D): A POS
ANTIBODY SCREEN: NEGATIVE

## 2013-11-30 SURGERY — HEMIARTHROPLASTY, HIP, DIRECT ANTERIOR APPROACH, FOR FRACTURE
Anesthesia: Spinal | Site: Hip | Laterality: Left

## 2013-11-30 MED ORDER — ONDANSETRON HCL 4 MG/2ML IJ SOLN: 4.0000 mg | Freq: Four times a day (QID) | INTRAMUSCULAR | Status: DC | PRN

## 2013-11-30 MED ORDER — BUPIVACAINE HCL (PF) 0.5 % IJ SOLN
INTRAMUSCULAR | Status: AC
  Filled 2013-11-30: qty 30

## 2013-11-30 MED ORDER — KETAMINE HCL 10 MG/ML IJ SOLN
INTRAMUSCULAR | Status: AC
  Filled 2013-11-30: qty 1

## 2013-11-30 MED ORDER — DEXTROSE 5 % IV SOLN
500.0000 mg | Freq: Four times a day (QID) | INTRAVENOUS | Status: DC | PRN
  Filled 2013-11-30: qty 5

## 2013-11-30 MED ORDER — PROPOFOL 10 MG/ML IV BOLUS
INTRAVENOUS | Status: AC
  Filled 2013-11-30: qty 20

## 2013-11-30 MED ORDER — METHOCARBAMOL 500 MG PO TABS
500.0000 mg | ORAL_TABLET | Freq: Four times a day (QID) | ORAL | Status: DC | PRN
  Administered 2013-11-30 – 2013-12-03 (×4): 500 mg via ORAL
  Filled 2013-11-30 (×5): qty 1

## 2013-11-30 MED ORDER — ACETAMINOPHEN 325 MG PO TABS: 650.0000 mg | ORAL_TABLET | Freq: Four times a day (QID) | ORAL | Status: DC | PRN

## 2013-11-30 MED ORDER — SIMVASTATIN 20 MG PO TABS
20.0000 mg | ORAL_TABLET | Freq: Every day | ORAL | Status: DC
  Administered 2013-12-01 – 2013-12-04 (×4): 20 mg via ORAL
  Filled 2013-11-30 (×4): qty 1

## 2013-11-30 MED ORDER — MENTHOL 3 MG MT LOZG
1.0000 | LOZENGE | OROMUCOSAL | Status: DC | PRN
Start: 2013-11-30 — End: 2013-12-04

## 2013-11-30 MED ORDER — POLYETHYLENE GLYCOL 3350 17 G PO PACK
17.0000 g | PACK | Freq: Every day | ORAL | Status: DC | PRN
Start: 2013-11-30 — End: 2013-12-04

## 2013-11-30 MED ORDER — ENSURE COMPLETE PO LIQD
237.0000 mL | Freq: Every day | ORAL | Status: DC
  Administered 2013-11-30 – 2013-12-03 (×3): 237 mL via ORAL

## 2013-11-30 MED ORDER — CEFAZOLIN SODIUM-DEXTROSE 2-3 GM-% IV SOLR
INTRAVENOUS | Status: DC | PRN
  Administered 2013-11-30: 2 g via INTRAVENOUS

## 2013-11-30 MED ORDER — MORPHINE SULFATE 2 MG/ML IJ SOLN: 0.5000 mg | INTRAMUSCULAR | Status: DC | PRN

## 2013-11-30 MED ORDER — PROPOFOL INFUSION 10 MG/ML OPTIME
INTRAVENOUS | Status: DC | PRN
  Administered 2013-11-30: 25 ug/kg/min via INTRAVENOUS

## 2013-11-30 MED ORDER — STERILE WATER FOR IRRIGATION IR SOLN
Status: DC | PRN
  Administered 2013-11-30: 1500 mL

## 2013-11-30 MED ORDER — ONDANSETRON HCL 4 MG PO TABS: 4.0000 mg | ORAL_TABLET | Freq: Four times a day (QID) | ORAL | Status: DC | PRN

## 2013-11-30 MED ORDER — CEFAZOLIN SODIUM-DEXTROSE 2-3 GM-% IV SOLR
INTRAVENOUS | Status: AC
  Filled 2013-11-30: qty 50

## 2013-11-30 MED ORDER — OXYCODONE HCL 5 MG/5ML PO SOLN
5.0000 mg | Freq: Once | ORAL | Status: DC | PRN
  Filled 2013-11-30: qty 5

## 2013-11-30 MED ORDER — METOCLOPRAMIDE HCL 10 MG PO TABS: 5.0000 mg | ORAL_TABLET | Freq: Three times a day (TID) | ORAL | Status: DC | PRN

## 2013-11-30 MED ORDER — BISACODYL 10 MG RE SUPP: 10.0000 mg | Freq: Every day | RECTAL | Status: DC | PRN

## 2013-11-30 MED ORDER — ENOXAPARIN SODIUM 40 MG/0.4ML ~~LOC~~ SOLN
40.0000 mg | SUBCUTANEOUS | Status: DC
  Administered 2013-12-01 – 2013-12-04 (×4): 40 mg via SUBCUTANEOUS
  Filled 2013-11-30 (×5): qty 0.4

## 2013-11-30 MED ORDER — TRAMADOL HCL 50 MG PO TABS: 50.0000 mg | ORAL_TABLET | Freq: Four times a day (QID) | ORAL | Status: DC | PRN

## 2013-11-30 MED ORDER — LACTATED RINGERS IV SOLN
INTRAVENOUS | Status: DC | PRN
  Administered 2013-11-30: 15:00:00 via INTRAVENOUS

## 2013-11-30 MED ORDER — FENTANYL CITRATE 0.05 MG/ML IJ SOLN
50.0000 ug | Freq: Once | INTRAMUSCULAR | Status: AC
  Administered 2013-11-30: 50 ug via INTRAVENOUS
  Filled 2013-11-30: qty 2

## 2013-11-30 MED ORDER — PROMETHAZINE HCL 25 MG/ML IJ SOLN: 6.2500 mg | INTRAMUSCULAR | Status: DC | PRN

## 2013-11-30 MED ORDER — TAMSULOSIN HCL 0.4 MG PO CAPS
0.4000 mg | ORAL_CAPSULE | Freq: Every day | ORAL | Status: DC
  Administered 2013-12-01 – 2013-12-04 (×4): 0.4 mg via ORAL
  Filled 2013-11-30 (×4): qty 1

## 2013-11-30 MED ORDER — MEPERIDINE HCL 50 MG/ML IJ SOLN: 6.2500 mg | INTRAMUSCULAR | Status: DC | PRN

## 2013-11-30 MED ORDER — PANTOPRAZOLE SODIUM 40 MG PO TBEC
80.0000 mg | DELAYED_RELEASE_TABLET | Freq: Every day | ORAL | Status: DC
  Administered 2013-12-01 – 2013-12-04 (×4): 80 mg via ORAL
  Filled 2013-11-30 (×4): qty 2

## 2013-11-30 MED ORDER — LIDOCAINE HCL (CARDIAC) 20 MG/ML IV SOLN
INTRAVENOUS | Status: AC
  Filled 2013-11-30: qty 5

## 2013-11-30 MED ORDER — ACETAMINOPHEN 650 MG RE SUPP: 650.0000 mg | Freq: Four times a day (QID) | RECTAL | Status: DC | PRN

## 2013-11-30 MED ORDER — SODIUM CHLORIDE 0.9 % IV SOLN
INTRAVENOUS | Status: DC
  Administered 2013-11-30: 18:00:00 via INTRAVENOUS
  Filled 2013-11-30 (×6): qty 1000

## 2013-11-30 MED ORDER — ACETAMINOPHEN 325 MG PO TABS
650.0000 mg | ORAL_TABLET | Freq: Four times a day (QID) | ORAL | Status: DC | PRN
  Administered 2013-12-02: 325 mg via ORAL
  Filled 2013-11-30 (×2): qty 2

## 2013-11-30 MED ORDER — 0.9 % SODIUM CHLORIDE (POUR BTL) OPTIME
TOPICAL | Status: DC | PRN
  Administered 2013-11-30: 1000 mL

## 2013-11-30 MED ORDER — HEPARIN SODIUM (PORCINE) 5000 UNIT/ML IJ SOLN
5000.0000 [IU] | Freq: Three times a day (TID) | INTRAMUSCULAR | Status: DC
  Filled 2013-11-30 (×2): qty 1

## 2013-11-30 MED ORDER — SODIUM CHLORIDE 0.9 % IJ SOLN
INTRAMUSCULAR | Status: AC
  Filled 2013-11-30: qty 10

## 2013-11-30 MED ORDER — DOCUSATE SODIUM 100 MG PO CAPS
100.0000 mg | ORAL_CAPSULE | Freq: Two times a day (BID) | ORAL | Status: DC
  Administered 2013-12-01 – 2013-12-03 (×4): 100 mg via ORAL

## 2013-11-30 MED ORDER — PROPOFOL 10 MG/ML IV BOLUS
INTRAVENOUS | Status: AC
Start: 2013-11-30 — End: ?
  Filled 2013-11-30: qty 20

## 2013-11-30 MED ORDER — PROPOFOL 10 MG/ML IV EMUL
INTRAVENOUS | Status: DC | PRN
  Administered 2013-11-30: 20 mg via INTRAVENOUS
  Administered 2013-11-30: 10 mg via INTRAVENOUS

## 2013-11-30 MED ORDER — HYDROCODONE-ACETAMINOPHEN 5-325 MG PO TABS: 1.0000 | ORAL_TABLET | Freq: Four times a day (QID) | ORAL | Status: DC | PRN

## 2013-11-30 MED ORDER — METOCLOPRAMIDE HCL 5 MG/ML IJ SOLN: 5.0000 mg | Freq: Three times a day (TID) | INTRAMUSCULAR | Status: DC | PRN

## 2013-11-30 MED ORDER — EPHEDRINE SULFATE 50 MG/ML IJ SOLN
INTRAMUSCULAR | Status: AC
  Filled 2013-11-30: qty 1

## 2013-11-30 MED ORDER — EPHEDRINE SULFATE 50 MG/ML IJ SOLN
INTRAMUSCULAR | Status: DC | PRN
  Administered 2013-11-30: 5 mg via INTRAVENOUS

## 2013-11-30 MED ORDER — ONDANSETRON HCL 4 MG/2ML IJ SOLN
4.0000 mg | Freq: Once | INTRAMUSCULAR | Status: AC
  Administered 2013-11-30: 4 mg via INTRAVENOUS
  Filled 2013-11-30: qty 2

## 2013-11-30 MED ORDER — FENTANYL CITRATE 0.05 MG/ML IJ SOLN
INTRAMUSCULAR | Status: AC
  Filled 2013-11-30: qty 2

## 2013-11-30 MED ORDER — PHENYLEPHRINE HCL 10 MG/ML IJ SOLN
10.0000 mg | INTRAVENOUS | Status: DC | PRN
  Administered 2013-11-30: 10 ug/min via INTRAVENOUS

## 2013-11-30 MED ORDER — HYDROMORPHONE HCL PF 1 MG/ML IJ SOLN: 0.2500 mg | INTRAMUSCULAR | Status: DC | PRN

## 2013-11-30 MED ORDER — FLEET ENEMA 7-19 GM/118ML RE ENEM: 1.0000 | ENEMA | Freq: Once | RECTAL | Status: AC | PRN

## 2013-11-30 MED ORDER — OXYCODONE HCL 5 MG PO TABS
5.0000 mg | ORAL_TABLET | ORAL | Status: DC | PRN
  Administered 2013-11-30 – 2013-12-03 (×5): 5 mg via ORAL
  Filled 2013-11-30 (×2): qty 1
  Filled 2013-11-30: qty 2
  Filled 2013-11-30: qty 1
  Filled 2013-11-30: qty 2
  Filled 2013-11-30: qty 1

## 2013-11-30 MED ORDER — OXYCODONE HCL 5 MG PO TABS: 5.0000 mg | ORAL_TABLET | Freq: Once | ORAL | Status: DC | PRN

## 2013-11-30 MED ORDER — PHENOL 1.4 % MT LIQD: 1.0000 | OROMUCOSAL | Status: DC | PRN

## 2013-11-30 SURGICAL SUPPLY — 54 items
BAG ZIPLOCK 12X15 (MISCELLANEOUS) ×3 IMPLANT
BIT DRILL 2.8X128 (BIT) ×2 IMPLANT
BIT DRILL 2.8X128MM (BIT) ×1
BLADE SAW SAG 73X25 THK (BLADE) ×2
BLADE SAW SGTL 73X25 THK (BLADE) ×1 IMPLANT
BRUSH FEMORAL CANAL (MISCELLANEOUS) ×3 IMPLANT
CAPT HIP FX BIPOLAR/UNIPOLAR ×3 IMPLANT
CEMENT BONE DEPUY (Cement) ×6 IMPLANT
CEMENT RESTRICTOR DEPUY SZ 4 (Cement) ×6 IMPLANT
CLOSURE STERI-STRIP 1/4X4 (GAUZE/BANDAGES/DRESSINGS) ×3 IMPLANT
CLOSURE WOUND 1/2 X4 (GAUZE/BANDAGES/DRESSINGS) ×1
DRAPE INCISE IOBAN 66X45 STRL (DRAPES) ×3 IMPLANT
DRAPE ORTHO SPLIT 77X108 STRL (DRAPES) ×4
DRAPE POUCH INSTRU U-SHP 10X18 (DRAPES) ×3 IMPLANT
DRAPE SURG ORHT 6 SPLT 77X108 (DRAPES) ×2 IMPLANT
DRAPE U-SHAPE 47X51 STRL (DRAPES) ×3 IMPLANT
DRSG ADAPTIC 3X8 NADH LF (GAUZE/BANDAGES/DRESSINGS) ×3 IMPLANT
DRSG EMULSION OIL 3X16 NADH (GAUZE/BANDAGES/DRESSINGS) ×3 IMPLANT
DRSG MEPILEX BORDER 4X4 (GAUZE/BANDAGES/DRESSINGS) ×3 IMPLANT
DRSG MEPILEX BORDER 4X8 (GAUZE/BANDAGES/DRESSINGS) ×3 IMPLANT
DRSG PAD ABDOMINAL 8X10 ST (GAUZE/BANDAGES/DRESSINGS) ×3 IMPLANT
DURAPREP 26ML APPLICATOR (WOUND CARE) ×3 IMPLANT
ELECT REM PT RETURN 9FT ADLT (ELECTROSURGICAL) ×3
ELECTRODE REM PT RTRN 9FT ADLT (ELECTROSURGICAL) ×1 IMPLANT
EVACUATOR 1/8 PVC DRAIN (DRAIN) ×3 IMPLANT
FACESHIELD WRAPAROUND (MASK) ×9 IMPLANT
GAUZE SPONGE 4X4 12PLY STRL (GAUZE/BANDAGES/DRESSINGS) ×6 IMPLANT
GLOVE BIO SURGEON STRL SZ7.5 (GLOVE) ×3 IMPLANT
GLOVE BIO SURGEON STRL SZ8 (GLOVE) ×6 IMPLANT
GLOVE BIOGEL PI IND STRL 8 (GLOVE) ×1 IMPLANT
GLOVE BIOGEL PI INDICATOR 8 (GLOVE) ×2
GOWN STRL REUS W/TWL LRG LVL3 (GOWN DISPOSABLE) ×3 IMPLANT
GOWN STRL REUS W/TWL XL LVL3 (GOWN DISPOSABLE) ×3 IMPLANT
HANDPIECE INTERPULSE COAX TIP (DISPOSABLE) ×2
IMMOBILIZER KNEE 20 (SOFTGOODS)
IMMOBILIZER KNEE 20 THIGH 36 (SOFTGOODS) IMPLANT
KIT BASIN OR (CUSTOM PROCEDURE TRAY) ×3 IMPLANT
MANIFOLD NEPTUNE II (INSTRUMENTS) ×3 IMPLANT
PACK TOTAL JOINT (CUSTOM PROCEDURE TRAY) ×3 IMPLANT
PADDING CAST COTTON 6X4 STRL (CAST SUPPLIES) ×6 IMPLANT
PASSER SUT SWANSON 36MM LOOP (INSTRUMENTS) ×3 IMPLANT
POSITIONER SURGICAL ARM (MISCELLANEOUS) ×3 IMPLANT
PRESSURIZER FEMORAL UNIV (MISCELLANEOUS) ×3 IMPLANT
SCOTCHCAST PLUS 4X4 WHITE (CAST SUPPLIES) ×3 IMPLANT
SET HNDPC FAN SPRY TIP SCT (DISPOSABLE) ×1 IMPLANT
STRIP CLOSURE SKIN 1/2X4 (GAUZE/BANDAGES/DRESSINGS) ×2 IMPLANT
SUT ETHIBOND NAB CT1 #1 30IN (SUTURE) ×6 IMPLANT
SUT MNCRL AB 4-0 PS2 18 (SUTURE) ×3 IMPLANT
SUT VIC AB 1 CT1 27 (SUTURE) ×6
SUT VIC AB 1 CT1 27XBRD ANTBC (SUTURE) ×3 IMPLANT
SUT VIC AB 2-0 CT1 27 (SUTURE) ×6
SUT VIC AB 2-0 CT1 TAPERPNT 27 (SUTURE) ×3 IMPLANT
TOWEL OR 17X26 10 PK STRL BLUE (TOWEL DISPOSABLE) ×6 IMPLANT
TOWER CARTRIDGE SMART MIX (DISPOSABLE) ×3 IMPLANT

## 2013-11-30 NOTE — Transfer of Care (Signed)
Immediate Anesthesia Transfer of Care Note  Patient: [name]  Procedure(s) Performed: Procedure(s): ARTHROPLASTY BIPOLAR HIP (Left)  Patient Location: PACU  Anesthesia Type:Spinal  Level of Consciousness: awake, alert  and oriented  Airway & Oxygen Therapy: Patient Spontanous Breathing and Patient connected to face mask oxygen  Post-op Assessment: Report given to PACU RN and Post -op Vital signs reviewed and stable  Post vital signs: Reviewed and stable  Complications: No apparent anesthesia complications

## 2013-11-30 NOTE — ED Provider Notes (Signed)
CSN: [id number]     Arrival date & time 11/30/13  0516 History   First MD Initiated Contact with Patient 11/30/13 [phone]     Chief Complaint  Patient presents with  . Emesis  . Diarrhea  . Weakness     (Consider location/radiation/quality/duration/timing/severity/associated sxs/prior Treatment) HPI Comments: Patient is a 80 year old male with history of barretts esophagus, CHF, gastritis, GERD, hyperlipidemia, hypothyroidism, hypertension who presents to the ED for evaluation of generalized weakness, nausea, vomiting, diarrhea. He reports that 2 weeks ago he fell and broke his hip. He reports it was a mechanical fall and he tripped over something. His hip fracture was discovered Saturday on MRI. Initial plain films of hip were negative. He has not seen an orthopedist for this yet. He was seeing a chiropractor which made his pain worse. He took 3 of his pain pills a few hours prior to arrival and had an episode of vomiting and loose stool. He states that he feels improved now, but complains of hip pain.   The history is provided by the patient. No language interpreter was used.    Past Medical History  Diagnosis Date  . BARRETTS ESOPHAGUS 11/19/2007  . CARDIOMYOPATHY 08/02/2008  . CHANGE IN BOWELS 02/01/2009  . CHF 08/10/2008  . Diarrhea 01/31/2009  . DIVERTICULOSIS, COLON 11/19/2007  . GASTRITIS 11/19/2007  . GERD 11/19/2007  . HIATAL HERNIA 11/19/2007  . HYPERLIPIDEMIA 06/27/2009  . HYPOTHYROIDISM 06/27/2009  . MELENA 08/01/2009  . PERSONAL HX COLONIC POLYPS 02/01/2009  . TINEA CORPORIS 06/27/2009  . TOBACCO USE, QUIT 06/27/2009  . TREMOR, ESSENTIAL 06/27/2009  . HTN (hypertension)   . IBS (irritable bowel syndrome)   . Heart murmur   . Blood transfusion without reported diagnosis    Past Surgical History  Procedure Laterality Date  . Cholecystectomy    . Lung surgery  1984  . Umbilical hernia repair    . Tonsillectomy    . Vasectomy    . Esophagogastroduodenoscopy N/A 04/28/2013    Procedure:  ESOPHAGOGASTRODUODENOSCOPY (EGD);  Surgeon: Jerene Bears, MD;  Location: [facility];  Service: Gastroenterology;  Laterality: N/A;  . Tonsillectomy    . Hernia repair     Family History  Problem Relation Age of Onset  . Heart disease Mother   . Colon cancer Father   . Prostate cancer Brother    History  Substance Use Topics  . Smoking status: Former Smoker -- 3.00 packs/day for 24 years    Types: Cigarettes    Quit date: 03/27/1943  . Smokeless tobacco: Never Used     Comment: Widowed x 2, has lady friend  . Alcohol Use: 0.5 oz/week    1 drink(s) per week     Comment: occassional    Review of Systems  Constitutional: Negative for fever and chills.  Respiratory: Negative for shortness of breath.   Cardiovascular: Negative for chest pain.  Gastrointestinal: Positive for nausea, vomiting and diarrhea. Negative for abdominal pain.  Musculoskeletal: Positive for arthralgias and gait problem.  All other systems reviewed and are negative.     Allergies  Ciprofloxacin and Penicillins  Home Medications   Prior to Admission medications   Medication Sig Start Date End Date Taking? Authorizing Provider  esomeprazole (NEXIUM) 40 MG capsule Take 1 capsule (40 mg total) by mouth daily at 12 noon. 05/28/13  Yes Jerene Bears, MD  feeding supplement, ENSURE COMPLETE, (ENSURE COMPLETE) LIQD Take 237 mLs by mouth at bedtime. 07/14/13  Yes Sendil Wynelle Link,  MD  furosemide (LASIX) 20 MG tablet Take 20-40 mg by mouth daily. Take 20mg  one day then alternate with 40mg  the next day   Yes Historical Provider, MD  HYDROcodone-acetaminophen (NORCO/VICODIN) 5-325 MG per tablet Take 1 tablet by mouth every 6 (six) hours as needed for moderate pain.   Yes Historical Provider, MD  lisinopril-hydrochlorothiazide (PRINZIDE,ZESTORETIC) 10-12.5 MG per tablet Take 1 tablet by mouth daily with breakfast. 05/27/13  Yes Fay Records, MD  metoprolol succinate (TOPROL-XL) 25 MG 24 hr tablet Take 1 tablet (25 mg  total) by mouth daily with breakfast. 05/27/13  Yes Fay Records, MD  Multiple Vitamins-Minerals (PRESERVISION AREDS 2 PO) Take 1 tablet by mouth 2 (two) times daily. 1 TAB TWICE A DAY   Yes Historical Provider, MD  OVER THE COUNTER MEDICATION Take 1 tablet by mouth daily. Over the counter probiotic (not align)   Yes Historical Provider, MD  simvastatin (ZOCOR) 20 MG tablet Take 1 tablet (20 mg total) by mouth daily. 08/18/13  Yes Thao P Le, DO  Tamsulosin HCl (FLOMAX) 0.4 MG CAPS Take 0.4 mg by mouth daily.    Yes Historical Provider, MD  triamcinolone cream (KENALOG) 0.1 % Apply 1 application topically 3 (three) times daily. 10/07/13  Yes Posey Boyer, MD   BP 93/37  Pulse 74  Temp(Src) 98.5 F (36.9 C) (Oral)  Resp 18  SpO2 90% Physical Exam  Nursing note and vitals reviewed. Constitutional: He is oriented to person, place, and time. He appears well-developed and well-nourished. No distress.  HENT:  Head: Normocephalic and atraumatic.  Right Ear: External ear normal.  Left Ear: External ear normal.  Nose: Nose normal.  Mouth/Throat: Oropharynx is clear and moist.  Eyes: Conjunctivae and EOM are normal. Pupils are equal, round, and reactive to light.  Neck: Normal range of motion. Neck supple. No tracheal deviation present.  Cardiovascular: Normal rate, regular rhythm, normal heart sounds, intact distal pulses and normal pulses.   Pulses:      Posterior tibial pulses are 2+ on the right side, and 2+ on the left side.  Pulmonary/Chest: Effort normal and breath sounds normal. No stridor.  Abdominal: Soft. He exhibits no distension. There is no tenderness.  Musculoskeletal: Normal range of motion.  Tender to palpation over left hip  Neurological: He is alert and oriented to person, place, and time.  Skin: Skin is warm and dry. He is not diaphoretic.  Psychiatric: He has a normal mood and affect. His behavior is normal.    ED Course  Procedures (including critical care time) Labs  Review Labs Reviewed  CBC - Abnormal; Notable for the following:    WBC 15.1 (*)    RBC 3.89 (*)    Hemoglobin 11.6 (*)    HCT 33.9 (*)    All other components within normal limits  COMPREHENSIVE METABOLIC PANEL - Abnormal; Notable for the following:    Sodium 134 (*)    Potassium 3.5 (*)    Glucose, Bld 101 (*)    BUN 51 (*)    Creatinine, Ser 1.63 (*)    Calcium 7.8 (*)    Albumin 2.7 (*)    GFR calc non Af Amer 35 (*)    GFR calc Af Amer 41 (*)    Anion gap 16 (*)    All other components within normal limits  URINALYSIS, ROUTINE W REFLEX MICROSCOPIC - Abnormal; Notable for the following:    APPearance CLOUDY (*)    All other components within  normal limits  URINE CULTURE  LIPASE, BLOOD  PROTIME-INR  I-STAT CG4 LACTIC ACID, ED  TYPE AND SCREEN  ABO/RH    Imaging Review Ct Head Wo Contrast  11/30/2013   CLINICAL DATA:  80 year old male. Headache. Generalized weakness. Nausea and vomiting with diarrhea.  EXAM: CT HEAD WITHOUT CONTRAST  TECHNIQUE: Contiguous axial images were obtained from the base of the skull through the vertex without intravenous contrast.  COMPARISON:  06/27/2010 sinus CT.  No comparison head CT.  FINDINGS: No skull fracture or intracranial hemorrhage.  Moderate small vessel disease type changes with findings suggestive of remote small left parietal lobe infarct.  The internal carotid arteries and basilar artery appear slightly dense, not significantly dissimilar from the prior exam as may indicate the presence of thrombus. Overall, no CT evidence of large acute infarct.  Mild global age-appropriate atrophy without hydrocephalus.  No intracranial mass lesion noted on this unenhanced exam.  Persistent polypoid opacification right sphenoid sinus with minimal polypoid opacification left sphenoid sinus and inferior aspect of the right maxillary sinus.  IMPRESSION: Moderate small vessel disease type changes without CT evidence of large acute infarct as discussed above.   No intracranial hemorrhage.  No intracranial mass lesion noted on this unenhanced exam.  Age-appropriate atrophy without hydrocephalus.  Paranasal sinus opacification as noted above.   Electronically Signed   By: Chauncey Cruel M.D.   On: 11/30/2013 07:59     EKG Interpretation   Date/Time:  Monday November 30 2013 05:25:02 EDT Ventricular Rate:  73 PR Interval:  172 QRS Duration: 99 QT Interval:  411 QTC Calculation: 453 R Axis:   -30 Text Interpretation:  Sinus rhythm Left axis deviation Poor R wave  progression Since previous tracing sinus rhythm has replaced atrial  fibrillation Confirmed by Canary Brim  MD, MARTHA [phone]) on 11/30/2013 7:10:09  AM      8:42 AM Discussed case with Dr. Anne Fu OR nurse. Dr. Wynelle Link will evaluate patient after surgery  MDM   Final diagnoses:  Femoral neck fracture, left, closed, initial encounter  Acute on chronic renal failure  Barrett's esophagus  Chronic combined systolic and diastolic CHF (congestive heart failure)   Patient presents to ED for evaluation of generalized weakness. Has left femoral neck fracture from mechanical fall on 8/9. Patient with episode of n/v/d after taking 3 norco. Will admit to medicine for further management. Orthopedics has been consulted for further evaluation of left hip fracture. Admission and consultation are appreciated. Vital signs stable at this time. Discussed case with Dr. Wilson Singer who agrees with plan. Patient / Family / Caregiver informed of clinical course, understand medical decision-making process, and agree with plan.   Elwyn Lade, PA-C 11/30/13 1042

## 2013-11-30 NOTE — Anesthesia Preprocedure Evaluation (Addendum)
Anesthesia Evaluation  Patient identified by MRN, date of birth, ID band Patient awake    Reviewed: Allergy & Precautions, H&P , NPO status , Patient's Chart, lab work & pertinent test results, reviewed documented beta blocker date and time   Airway Mallampati: II TM Distance: >3 FB Neck ROM: Full    Dental no notable dental hx.    Pulmonary pneumonia -, former smoker,  breath sounds clear to auscultation  Pulmonary exam normal       Cardiovascular hypertension, Pt. on medications and Pt. on home beta blockers +CHF + Valvular Problems/Murmurs Rhythm:Regular Rate:Normal  Echo 06/2013 - Left ventricle: Poor acoustic windows limit study. OVreall   LVEF is approximately 45% with inferior/inferoseptal   hypokinesis. The cavity size was normal. Wall thickness   was increased in a pattern of mild LVH. Doppler parameters   are consistent with abnormal left ventricular relaxation   (grade 1 diastolic dysfunction). - Aortic valve: Mild regurgitation. - Mitral valve: Mild regurgitation. - Right ventricle: The cavity size was mildly dilated.   Neuro/Psych PSYCHIATRIC DISORDERS negative neurological ROS     GI/Hepatic Neg liver ROS, GERD-  ,  Endo/Other  negative endocrine ROSHypothyroidism   Renal/GU CRF, ARF and Renal InsufficiencyRenal diseasenegative Renal ROS     Musculoskeletal negative musculoskeletal ROS (+)   Abdominal   Peds  Hematology negative hematology ROS (+)   Anesthesia Other Findings   Reproductive/Obstetrics                        Anesthesia Physical Anesthesia Plan  ASA: III  Anesthesia Plan: Spinal   Post-op Pain Management:    Induction:   Airway Management Planned:   Additional Equipment:   Intra-op Plan:   Post-operative Plan:   Informed Consent: I have reviewed the patients History and Physical, chart, labs and discussed the procedure including the risks, benefits and  alternatives for the proposed anesthesia with the patient or authorized representative who has indicated his/her understanding and acceptance.   Dental advisory given  Plan Discussed with: CRNA  Anesthesia Plan Comments:         Anesthesia Quick Evaluation

## 2013-11-30 NOTE — ED Notes (Signed)
Pt brought in by EMS from assisted living with c/o general weakness, nausea, vomiting, and diarrhea that started last night  Initial B/P when EMS arrived was 80/40  IV established and NS started  Pt transported to hospital for evaluation  Monitor reading NSR rate in the 60s   Pt is a resident at [facility]

## 2013-11-30 NOTE — Consult Note (Signed)
Reason for Consult:Left femoral neck fracture Referring Physician: Dr. Tat  [name] is an 80 y.o. male.  HPI: [name] is a 80 yo male who had a fall approximately 2 weeks ago with left hip pain. He was seen by Dr. Guest at [facility] and had negative hip x-rays. He was treated by a chiropractor and his pain worsened. He has been in a wheelchair for the past week and unable to bear weight. He had an MRI done which showed a displaced left femoral neck fracture. He presents to the ED today for management. His main complaint is left hip pain. He has no other complaints.  Past Medical History  Diagnosis Date  . BARRETTS ESOPHAGUS 11/19/2007  . CARDIOMYOPATHY 08/02/2008  . CHANGE IN BOWELS 02/01/2009  . CHF 08/10/2008  . Diarrhea 01/31/2009  . DIVERTICULOSIS, COLON 11/19/2007  . GASTRITIS 11/19/2007  . GERD 11/19/2007  . HIATAL HERNIA 11/19/2007  . HYPERLIPIDEMIA 06/27/2009  . HYPOTHYROIDISM 06/27/2009  . MELENA 08/01/2009  . PERSONAL HX COLONIC POLYPS 02/01/2009  . TINEA CORPORIS 06/27/2009  . TOBACCO USE, QUIT 06/27/2009  . TREMOR, ESSENTIAL 06/27/2009  . HTN (hypertension)   . IBS (irritable bowel syndrome)   . Heart murmur   . Blood transfusion without reported diagnosis     Past Surgical History  Procedure Laterality Date  . Cholecystectomy    . Lung surgery  1984  . Umbilical hernia repair    . Tonsillectomy    . Vasectomy    . Esophagogastroduodenoscopy N/A 04/28/2013    Procedure: ESOPHAGOGASTRODUODENOSCOPY (EGD);  Surgeon: Sean M Pyrtle, MD;  Location: [facility];  Service: Gastroenterology;  Laterality: N/A;  . Tonsillectomy    . Hernia repair      Family History  Problem Relation Age of Onset  . Heart disease Mother   . Colon cancer Father   . Prostate cancer Brother     Social History:  reports that he quit smoking about 70 years ago. His smoking use included Cigarettes. He has a 72 pack-year smoking history. He has never used smokeless tobacco. He reports that he  drinks about .5 ounces of alcohol per week. He reports that he does not use illicit drugs.  Allergies:  Allergies  Allergen Reactions  . Ciprofloxacin     Questionable allergy as pt reports taking med without difficulty for resp infection  . Penicillins Rash    Medications: I have reviewed the patient's current medications.  Results for orders placed during the hospital encounter of 11/30/13 (from the past 48 hour(s))  LIPASE, BLOOD     Status: None   Collection Time    11/30/13  6:13 AM      Result Value Ref Range   Lipase 24  11 - 59 U/L  CBC     Status: Abnormal   Collection Time    11/30/13  6:15 AM      Result Value Ref Range   WBC 15.1 (*) 4.0 - 10.5 K/uL   RBC 3.89 (*) 4.22 - 5.81 MIL/uL   Hemoglobin 11.6 (*) 13.0 - 17.0 g/dL   HCT 33.9 (*) 39.0 - 52.0 %   MCV 87.1  78.0 - 100.0 fL   MCH 29.8  26.0 - 34.0 pg   MCHC 34.2  30.0 - 36.0 g/dL   RDW 12.6  11.5 - 15.5 %   Platelets 257  150 - 400 K/uL  COMPREHENSIVE METABOLIC PANEL     Status: Abnormal   Collection Time      11/30/13  6:15 AM      Result Value Ref Range   Sodium 134 (*) 137 - 147 mEq/L   Potassium 3.5 (*) 3.7 - 5.3 mEq/L   Chloride 97  96 - 112 mEq/L   CO2 21  19 - 32 mEq/L   Glucose, Bld 101 (*) 70 - 99 mg/dL   BUN 51 (*) 6 - 23 mg/dL   Creatinine, Ser 1.63 (*) 0.50 - 1.35 mg/dL   Calcium 7.8 (*) 8.4 - 10.5 mg/dL   Total Protein 6.0  6.0 - 8.3 g/dL   Albumin 2.7 (*) 3.5 - 5.2 g/dL   AST 13  0 - 37 U/L   ALT 9  0 - 53 U/L   Alkaline Phosphatase 107  39 - 117 U/L   Total Bilirubin 0.4  0.3 - 1.2 mg/dL   GFR calc non Af Amer 35 (*) >90 mL/min   GFR calc Af Amer 41 (*) >90 mL/min   Comment: (NOTE)     The eGFR has been calculated using the CKD EPI equation.     This calculation has not been validated in all clinical situations.     eGFR's persistently <90 mL/min signify possible Chronic Kidney     Disease.   Anion gap 16 (*) 5 - 15  I-STAT CG4 LACTIC ACID, ED     Status: None   Collection Time     11/30/13  6:36 AM      Result Value Ref Range   Lactic Acid, Venous 0.65  0.5 - 2.2 mmol/L  PROTIME-INR     Status: None   Collection Time    11/30/13  8:02 AM      Result Value Ref Range   Prothrombin Time 13.1  11.6 - 15.2 seconds   INR 0.99  0.00 - 1.49  TYPE AND SCREEN     Status: None   Collection Time    11/30/13  8:02 AM      Result Value Ref Range   ABO/RH(D) A POS     Antibody Screen NEG     Sample Expiration 12/03/2013    ABO/RH     Status: None   Collection Time    11/30/13  8:02 AM      Result Value Ref Range   ABO/RH(D) A POS    URINALYSIS, ROUTINE W REFLEX MICROSCOPIC     Status: Abnormal   Collection Time    11/30/13  8:04 AM      Result Value Ref Range   Color, Urine YELLOW  YELLOW   APPearance CLOUDY (*) CLEAR   Specific Gravity, Urine 1.020  1.005 - 1.030   pH 5.0  5.0 - 8.0   Glucose, UA NEGATIVE  NEGATIVE mg/dL   Hgb urine dipstick NEGATIVE  NEGATIVE   Bilirubin Urine NEGATIVE  NEGATIVE   Ketones, ur NEGATIVE  NEGATIVE mg/dL   Protein, ur NEGATIVE  NEGATIVE mg/dL   Urobilinogen, UA 0.2  0.0 - 1.0 mg/dL   Nitrite NEGATIVE  NEGATIVE   Leukocytes, UA NEGATIVE  NEGATIVE   Comment: MICROSCOPIC NOT DONE ON URINES WITH NEGATIVE PROTEIN, BLOOD, LEUKOCYTES, NITRITE, OR GLUCOSE <1000 mg/dL.    Ct Head Wo Contrast  11/30/2013   CLINICAL DATA:  80-year-old male. Headache. Generalized weakness. Nausea and vomiting with diarrhea.  EXAM: CT HEAD WITHOUT CONTRAST  TECHNIQUE: Contiguous axial images were obtained from the base of the skull through the vertex without intravenous contrast.  COMPARISON:  06/27/2010 sinus CT.  No comparison   head CT.  FINDINGS: No skull fracture or intracranial hemorrhage.  Moderate small vessel disease type changes with findings suggestive of remote small left parietal lobe infarct.  The internal carotid arteries and basilar artery appear slightly dense, not significantly dissimilar from the prior exam as may indicate the presence of thrombus.  Overall, no CT evidence of large acute infarct.  Mild global age-appropriate atrophy without hydrocephalus.  No intracranial mass lesion noted on this unenhanced exam.  Persistent polypoid opacification right sphenoid sinus with minimal polypoid opacification left sphenoid sinus and inferior aspect of the right maxillary sinus.  IMPRESSION: Moderate small vessel disease type changes without CT evidence of large acute infarct as discussed above.  No intracranial hemorrhage.  No intracranial mass lesion noted on this unenhanced exam.  Age-appropriate atrophy without hydrocephalus.  Paranasal sinus opacification as noted above.   Electronically Signed   By: Steve  Olson M.D.   On: 11/30/2013 07:59    ROS Blood pressure 93/37, pulse 74, temperature 98.5 F (36.9 C), temperature source Oral, resp. rate 18, SpO2 90.00%. Physical Exam Physical Examination: General appearance - alert, well appearing, and in no distress Mental status - alert, oriented to person, place, and time Chest - clear to auscultation, no wheezes, rales or rhonchi, symmetric air entry Heart - normal rate, regular rhythm, normal S1, S2, no murmurs, rubs, clicks or gallops Abdomen - soft, nontender, nondistended, no masses or organomegaly Neurological - alert, oriented, normal speech, no focal findings or movement disorder noted Tender left hip. Pain on attempted range of motion. Left leg shortened and slightly rotated   Assessment/Plan: Left femoral neck fracture- Plan left hip hemiarthroplasty. Admitted by medical team and cleared. To OR this afternoon. Discussed with patient who elects to proceed.  , V 11/30/2013, 10:19 AM      

## 2013-11-30 NOTE — Op Note (Signed)
OPERATIVE REPORT   PREOPERATIVE DIAGNOSIS:  Left femoral neck fracture displaced.   POSTOPERATIVE DIAGNOSIS:  Left femoral neck fracture displaced.   PROCEDURE:  Left hip hemiarthroplasty.   SURGEON: Gaynelle Arabian, M.D.   ASSISTANT: Arlee Muslim, PA-C  ANESTHESIA: Spinal  ESTIMATED BLOOD LOSS:minimal    DRAINS: Hemovac x1.   COMPLICATIONS: None.   CONDITION: Stable to recovery.   BRIEF CLINICAL NOTE: [name] is an 80 y.o. male  who had a fall 2 weeks ago with left hip pain. He had x-rays at the time which were negative then had persistent worsening pain and an MRI showed a displaced   Left femoral neck fracture. He was evaluated in the emergency  department and admitted to the medical service. He was cleared for  surgery and presents for left hip hemiarthroplasty.   PROCEDURE IN DETAIL: After successful administration of general  anesthetic, the patient was placed in the Right lateral decubitus  position with the Left side up and held with the hip positioner.Left lower extremity was isolated from her perineum with plastic drapes and  prepped and draped in the usual sterile fashion. Short posterolateral  incision was made with a 10 blade through the subcutaneous tissue to the  level of fascia lata which was incised in line with the skin incision.  The sciatic nerve was palpated and protected, and short rotators and  capsule were isolated off the femur. There was complete fracture of the  femoral neck. The femoral head was removed. Diameters 52 mm. We then  isolated the femur with retractors and freshened up the femoral neck cut  with an oscillating saw. I used a starter reamer to get into the  femoral canal and then irrigated with saline. A lateralizing reamer was  then placed. We then broached up to a size 6, which had excellent  rotational and torsional stability. Trial neck for the size 6 with a  28 + 5 head and 52 bipolar was placed. Hip was reduced with excellent   stability. I placed the right leg on top of the left leg, and lengths  were equal. Hip was dislocated. All trials were removed. We trialed  the cement restrictor, and size 6 was most appropriate. The size 4  cement restrictor was placed at the appropriate depth in the femoral  canal. Canal was then prepared with pulsatile lavage and thoroughly  dried. Cement was mixed and once ready implantation was injected into  the femoral canal and pressurized. The size 6 [place] basic  cemented stem was then cemented into place in about 20 degrees of  anteversion. When the cement was fully hardened, then the permanent 28 + 5  head and 52 bipolar components were placed. Hip was reduced with  same stability parameters. Capsule and short rotators reattached to the  femur through drill holes with Ethibond suture. Fascia lata was closed  over Hemovac drain with running #1 V-Loc suture. Subcu closed with 2-0  Vicryl, and subcuticular running 4-0 Monocryl. The drains were hooked  to suction. Incision was cleaned and dried and Steri-Strips and a bulky  sterile dressing applied. She was then placed into a knee immobilizer,  awakened and transported to recovery in stable condition.   Gaynelle Arabian, M.D.

## 2013-11-30 NOTE — Interval H&P Note (Signed)
History and Physical Interval Note:  11/30/2013 2:52 PM  [name]  has presented today for surgery, with the diagnosis of Left hip fracture  The various methods of treatment have been discussed with the patient and family. After consideration of risks, benefits and other options for treatment, the patient has consented to  Procedure(s): ARTHROPLASTY BIPOLAR HIP (Left) as a surgical intervention .  The patient's history has been reviewed, patient examined, no change in status, stable for surgery.  I have reviewed the patient's chart and labs.  Questions were answered to the patient's satisfaction.     [name]

## 2013-11-30 NOTE — H&P (Signed)
History and Physical  [name] QIO:[id number] DOB: [birth year] DOA: 11/30/2013   PCP: Kennon Portela, MD   Chief Complaint: n/v  HPI:  80 year old male with a history of paroxysmal atrial fibrillation, Barrett's esophagus, systolic and diastolic CHF, diverticulosis, hyperlipidemia, hypertension presents with left ear pain with associated nausea and vomiting. The patient had a mechanical fall on 11/01/2013 without any syncope. He went to urgent care. Left hip x-rays on 11/02/2013 were negative for any fractures. The patient continued to have pain in his left hip that progressively worsened to the point that he was not able to bear weight. He went back to urgent care or MRI of the left hip was performed and revealed a displaced left femoral neck fracture. Apparently, the patient was called about the results on the day prior to this admission. He states that he was actually going to go to urgent care on 12/01/2013. However, in the early morning of 11/30/2013 around 2 AM the patient began having nausea, vomiting, and diarrhea. As a result, EMS was activated, and the patient was brought to the hospital. There was no hematemesis or hematochezia. The patient denies any fevers, chills, chest pain or shortness of breath, dizziness, syncope, abdominal pain, dysuria, hematuria. He states that he normally has 1-2 loose bowel movements everyday due to his irritable bowel syndrome. During the evening prior to this admission, the patient took 3 pills of Norco and 3 pills of what appears to be ibuprofen per his history due to uncontrolled pain.  In the emergency department, labs revealed serum creatinine 1.63, WBC 15.1, sodium 134, bicarbonate 21, lactic acid 0.65. EKG was sinus rhythm without ST-T wave changes. The patient had soft blood pressures in the emergency department particularly after being given fentanyl IV. Assessment/Plan:  left femoral neck fracture  -ortho has already been called--Dr.  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 Hilarie Fredrickson -continue PPI Hyponatremia -Secondary to volume depletion -Judicious IV fluids Leukocytosis -Likely stress demargination -Patient is afebrile -Continue to try and      Past Medical History  Diagnosis Date  . BARRETTS ESOPHAGUS 11/19/2007  . CARDIOMYOPATHY 08/02/2008  . CHANGE IN BOWELS 02/01/2009  . CHF 08/10/2008  . Diarrhea 01/31/2009  . DIVERTICULOSIS, COLON 11/19/2007  . GASTRITIS 11/19/2007  . GERD 11/19/2007  . HIATAL HERNIA 11/19/2007  . HYPERLIPIDEMIA 06/27/2009  . HYPOTHYROIDISM 06/27/2009  . MELENA 08/01/2009  . PERSONAL HX COLONIC POLYPS 02/01/2009  . TINEA CORPORIS 06/27/2009  . TOBACCO USE, QUIT 06/27/2009  . TREMOR, ESSENTIAL 06/27/2009  . HTN (hypertension)   . IBS (irritable bowel syndrome)   . Heart murmur   . Blood transfusion without reported diagnosis    Past Surgical History  Procedure Laterality Date  . Cholecystectomy    . Lung surgery  1984  . Umbilical hernia repair    . Tonsillectomy    . Vasectomy    . Esophagogastroduodenoscopy N/A 04/28/2013  Procedure: ESOPHAGOGASTRODUODENOSCOPY (EGD);  Surgeon: Jerene Bears, MD;  Location: [facility];  Service: Gastroenterology;  Laterality: N/A;  . Tonsillectomy    . Hernia repair     Social History:  reports that he quit smoking about  70 years ago. His smoking use included Cigarettes. He has a 72 pack-year smoking history. He has never used smokeless tobacco. He reports that he drinks about .5 ounces of alcohol per week. He reports that he does not use illicit drugs.   Family History  Problem Relation Age of Onset  . Heart disease Mother   . Colon cancer Father   . Prostate cancer Brother      Allergies  Allergen Reactions  . Ciprofloxacin     Questionable allergy as pt reports taking med without difficulty for resp infection  . Penicillins Rash      Prior to Admission medications   Medication Sig Start Date End Date Taking? Authorizing Provider  esomeprazole (NEXIUM) 40 MG capsule Take 1 capsule (40 mg total) by mouth daily at 12 noon. 05/28/13  Yes Jerene Bears, MD  feeding supplement, ENSURE COMPLETE, (ENSURE COMPLETE) LIQD Take 237 mLs by mouth at bedtime. 07/14/13  Yes Annita Brod, MD  furosemide (LASIX) 20 MG tablet Take 20-40 mg by mouth daily. Take 20mg  one day then alternate with 40mg  the next day   Yes Historical Provider, MD  HYDROcodone-acetaminophen (NORCO/VICODIN) 5-325 MG per tablet Take 1 tablet by mouth every 6 (six) hours as needed for moderate pain.   Yes Historical Provider, MD  lisinopril-hydrochlorothiazide (PRINZIDE,ZESTORETIC) 10-12.5 MG per tablet Take 1 tablet by mouth daily with breakfast. 05/27/13  Yes Fay Records, MD  metoprolol succinate (TOPROL-XL) 25 MG 24 hr tablet Take 1 tablet (25 mg total) by mouth daily with breakfast. 05/27/13  Yes Fay Records, MD  Multiple Vitamins-Minerals (PRESERVISION AREDS 2 PO) Take 1 tablet by mouth 2 (two) times daily. 1 TAB TWICE A DAY   Yes Historical Provider, MD  OVER THE COUNTER MEDICATION Take 1 tablet by mouth daily. Over the counter probiotic (not align)   Yes Historical Provider, MD  simvastatin (ZOCOR) 20 MG tablet Take 1 tablet (20 mg total) by mouth daily. 08/18/13  Yes Thao P Le, DO  Tamsulosin HCl (FLOMAX) 0.4 MG CAPS Take 0.4 mg by mouth  daily.    Yes Historical Provider, MD  triamcinolone cream (KENALOG) 0.1 % Apply 1 application topically 3 (three) times daily. 10/07/13  Yes Posey Boyer, MD    Review of Systems:  Constitutional:  No weight loss, night sweats, Fevers, chills, fatigue.  Head&Eyes: No headache.  No vision loss.  No eye pain or scotoma ENT:  No Difficulty swallowing,Tooth/dental problems,Sore throat,  No ear ache, post nasal drip,  Cardio-vascular:  No chest pain, Orthopnea, PND, swelling in lower extremities,  dizziness, palpitations  GI:  No  abdominal pain, nausea, vomiting, diarrhea, loss of appetite, hematochezia, melena, heartburn, indigestion, Resp:  No shortness of breath with exertion or at rest. No cough. No coughing up of blood .No wheezing.No chest wall deformity  Skin:  no rash or lesions.  GU:  no dysuria, change in color of urine, no urgency or frequency. No flank pain.  Musculoskeletal:  No back pain. C/o left hip pain Psych:  No change in mood or affect. No depression or anxiety. Neurologic: No headache, no dysesthesia, no focal weakness, no vision loss. No syncope  Physical Exam: Filed Vitals:   11/30/13 0517 11/30/13 0526 11/30/13  0805  BP:  112/47 93/37  Pulse:  69 74  Temp:  98.5 F (36.9 C)   TempSrc:  Oral   Resp:  18 18  SpO2: 98% 94% 90%   General:  A&O x 3, NAD, nontoxic, pleasant/cooperative Head/Eye: No conjunctival hemorrhage, no icterus, [place]/AT, No nystagmus ENT:  No icterus,  No thrush, good dentition, no pharyngeal exudate Neck:  No masses, no lymphadenpathy, no bruits CV:  RRR, no rub, no gallop, no S3 Lung:  Diminished breath sounds but CTAB, good air movement, no wheeze, no rhonchi Abdomen: soft/NT, +BS, nondistended, no peritoneal signs Ext: No cyanosis, No rashes, No petechiae, No lymphangitis, No edema   Labs on Admission:  Basic Metabolic Panel:  Recent Labs Lab 11/30/13 0615  NA 134*  K 3.5*  CL 97  CO2 21  GLUCOSE 101*  BUN 51*    CREATININE 1.63*  CALCIUM 7.8*   Liver Function Tests:  Recent Labs Lab 11/30/13 0615  AST 13  ALT 9  ALKPHOS 107  BILITOT 0.4  PROT 6.0  ALBUMIN 2.7*    Recent Labs Lab 11/30/13 0613  LIPASE 24   No results found for this basename: AMMONIA,  in the last 168 hours CBC:  Recent Labs Lab 11/30/13 0615  WBC 15.1*  HGB 11.6*  HCT 33.9*  MCV 87.1  PLT 257   Cardiac Enzymes: No results found for this basename: CKTOTAL, CKMB, CKMBINDEX, TROPONINI,  in the last 168 hours BNP: No components found with this basename: POCBNP,  CBG: No results found for this basename: GLUCAP,  in the last 168 hours  Radiological Exams on Admission: Ct Head Wo Contrast  11/30/2013   CLINICAL DATA:  80 year old male. Headache. Generalized weakness. Nausea and vomiting with diarrhea.  EXAM: CT HEAD WITHOUT CONTRAST  TECHNIQUE: Contiguous axial images were obtained from the base of the skull through the vertex without intravenous contrast.  COMPARISON:  06/27/2010 sinus CT.  No comparison head CT.  FINDINGS: No skull fracture or intracranial hemorrhage.  Moderate small vessel disease type changes with findings suggestive of remote small left parietal lobe infarct.  The internal carotid arteries and basilar artery appear slightly dense, not significantly dissimilar from the prior exam as may indicate the presence of thrombus. Overall, no CT evidence of large acute infarct.  Mild global age-appropriate atrophy without hydrocephalus.  No intracranial mass lesion noted on this unenhanced exam.  Persistent polypoid opacification right sphenoid sinus with minimal polypoid opacification left sphenoid sinus and inferior aspect of the right maxillary sinus.  IMPRESSION: Moderate small vessel disease type changes without CT evidence of large acute infarct as discussed above.  No intracranial hemorrhage.  No intracranial mass lesion noted on this unenhanced exam.  Age-appropriate atrophy without hydrocephalus.   Paranasal sinus opacification as noted above.   Electronically Signed   By: Chauncey Cruel M.D.   On: 11/30/2013 07:59    EKG: Independently reviewed. Sinus, no ST-T changes    Time spent:60 minutes Code Status:   FULL Family Communication:   Common law wife at bedside   Naz Denunzio, DO  [facility] Pager [phone]  If 7PM-7AM, please contact night-coverage [email or web address] Password [facility] 11/30/2013, 9:49 AM

## 2013-11-30 NOTE — Progress Notes (Signed)
Clinical Social Work Department BRIEF PSYCHOSOCIAL ASSESSMENT 11/30/2013  Patient:  [name], [name]     Account Number:  [id number]     Admit date:  11/30/2013  Clinical Social Worker:  Lacie Scotts  Date/Time:  11/30/2013 03:01 PM  Referred by:  Physician  Date Referred:  11/30/2013 Referred for  SNF Placement   Other Referral:   Interview type:  Family Other interview type:    PSYCHOSOCIAL DATA Living Status:  FACILITY Admitted from facility:  [facility] Level of care:  Independent Living Primary support name:  [name] Primary support relationship to patient:  CHILD, ADULT Degree of support available:   supportive    CURRENT CONCERNS Current Concerns  Post-Acute Placement   Other Concerns:    SOCIAL WORK ASSESSMENT / PLAN Pt is a 80 yr old gentleman admitted from [facility]. Pt fell 2 weeks ago and sustained a femoral neck fx. He is having surgery this afternoon. CSW met with pt's son prior to surgery. Pt was sleeping during visit. Pt will most likely need ST Rehab following hospital d/c. SNF list provided to son. CSW will meet again with pt/family following PT recommendations.   Assessment/plan status:  Psychosocial Support/Ongoing Assessment of Needs Other assessment/ plan:   Information/referral to community resources:   SNF list provided. Insurance coverage for SNF and ambulance transport reviewed. SNF vs [facility] services reviewed.    PATIENT'S/FAMILY'S RESPONSE TO PLAN OF CARE: CSW will meet with pt to review PT recommendations when available. Pt's son agrees, ST rehab may be needed. He is interested in looking into the [facility]. D/C planning is ongoing.   Werner Lean LCSW [phone]

## 2013-11-30 NOTE — H&P (View-Only) (Signed)
Reason for Consult:Left femoral neck fracture Referring Physician: Dr. Tat  Nnamdi R Offenberger is an 80 y.o. male.  HPI: Mr Gaumond is a 80 yo male who had a fall approximately 2 weeks ago with left hip pain. He was seen by Dr. Guest at [facility] and had negative hip x-rays. He was treated by a chiropractor and his pain worsened. He has been in a wheelchair for the past week and unable to bear weight. He had an MRI done which showed a displaced left femoral neck fracture. He presents to the ED today for management. His main complaint is left hip pain. He has no other complaints.  Past Medical History  Diagnosis Date  . BARRETTS ESOPHAGUS 11/19/2007  . CARDIOMYOPATHY 08/02/2008  . CHANGE IN BOWELS 02/01/2009  . CHF 08/10/2008  . Diarrhea 01/31/2009  . DIVERTICULOSIS, COLON 11/19/2007  . GASTRITIS 11/19/2007  . GERD 11/19/2007  . HIATAL HERNIA 11/19/2007  . HYPERLIPIDEMIA 06/27/2009  . HYPOTHYROIDISM 06/27/2009  . MELENA 08/01/2009  . PERSONAL HX COLONIC POLYPS 02/01/2009  . TINEA CORPORIS 06/27/2009  . TOBACCO USE, QUIT 06/27/2009  . TREMOR, ESSENTIAL 06/27/2009  . HTN (hypertension)   . IBS (irritable bowel syndrome)   . Heart murmur   . Blood transfusion without reported diagnosis     Past Surgical History  Procedure Laterality Date  . Cholecystectomy    . Lung surgery  1984  . Umbilical hernia repair    . Tonsillectomy    . Vasectomy    . Esophagogastroduodenoscopy N/A 04/28/2013    Procedure: ESOPHAGOGASTRODUODENOSCOPY (EGD);  Surgeon: Jay M Pyrtle, MD;  Location: [facility];  Service: Gastroenterology;  Laterality: N/A;  . Tonsillectomy    . Hernia repair      Family History  Problem Relation Age of Onset  . Heart disease Mother   . Colon cancer Father   . Prostate cancer Brother     Social History:  reports that he quit smoking about 70 years ago. His smoking use included Cigarettes. He has a 72 pack-year smoking history. He has never used smokeless tobacco. He reports that he  drinks about .5 ounces of alcohol per week. He reports that he does not use illicit drugs.  Allergies:  Allergies  Allergen Reactions  . Ciprofloxacin     Questionable allergy as pt reports taking med without difficulty for resp infection  . Penicillins Rash    Medications: I have reviewed the patient's current medications.  Results for orders placed during the hospital encounter of 11/30/13 (from the past 48 hour(s))  LIPASE, BLOOD     Status: None   Collection Time    11/30/13  6:13 AM      Result Value Ref Range   Lipase 24  11 - 59 U/L  CBC     Status: Abnormal   Collection Time    11/30/13  6:15 AM      Result Value Ref Range   WBC 15.1 (*) 4.0 - 10.5 K/uL   RBC 3.89 (*) 4.22 - 5.81 MIL/uL   Hemoglobin 11.6 (*) 13.0 - 17.0 g/dL   HCT 33.9 (*) 39.0 - 52.0 %   MCV 87.1  78.0 - 100.0 fL   MCH 29.8  26.0 - 34.0 pg   MCHC 34.2  30.0 - 36.0 g/dL   RDW 12.6  11.5 - 15.5 %   Platelets 257  150 - 400 K/uL  COMPREHENSIVE METABOLIC PANEL     Status: Abnormal   Collection Time      11/30/13  6:15 AM      Result Value Ref Range   Sodium 134 (*) 137 - 147 mEq/L   Potassium 3.5 (*) 3.7 - 5.3 mEq/L   Chloride 97  96 - 112 mEq/L   CO2 21  19 - 32 mEq/L   Glucose, Bld 101 (*) 70 - 99 mg/dL   BUN 51 (*) 6 - 23 mg/dL   Creatinine, Ser 1.63 (*) 0.50 - 1.35 mg/dL   Calcium 7.8 (*) 8.4 - 10.5 mg/dL   Total Protein 6.0  6.0 - 8.3 g/dL   Albumin 2.7 (*) 3.5 - 5.2 g/dL   AST 13  0 - 37 U/L   ALT 9  0 - 53 U/L   Alkaline Phosphatase 107  39 - 117 U/L   Total Bilirubin 0.4  0.3 - 1.2 mg/dL   GFR calc non Af Amer 35 (*) >90 mL/min   GFR calc Af Amer 41 (*) >90 mL/min   Comment: (NOTE)     The eGFR has been calculated using the CKD EPI equation.     This calculation has not been validated in all clinical situations.     eGFR's persistently <90 mL/min signify possible Chronic Kidney     Disease.   Anion gap 16 (*) 5 - 15  I-STAT CG4 LACTIC ACID, ED     Status: None   Collection Time     11/30/13  6:36 AM      Result Value Ref Range   Lactic Acid, Venous 0.65  0.5 - 2.2 mmol/L  PROTIME-INR     Status: None   Collection Time    11/30/13  8:02 AM      Result Value Ref Range   Prothrombin Time 13.1  11.6 - 15.2 seconds   INR 0.99  0.00 - 1.49  TYPE AND SCREEN     Status: None   Collection Time    11/30/13  8:02 AM      Result Value Ref Range   ABO/RH(D) A POS     Antibody Screen NEG     Sample Expiration 12/03/2013    ABO/RH     Status: None   Collection Time    11/30/13  8:02 AM      Result Value Ref Range   ABO/RH(D) A POS    URINALYSIS, ROUTINE W REFLEX MICROSCOPIC     Status: Abnormal   Collection Time    11/30/13  8:04 AM      Result Value Ref Range   Color, Urine YELLOW  YELLOW   APPearance CLOUDY (*) CLEAR   Specific Gravity, Urine 1.020  1.005 - 1.030   pH 5.0  5.0 - 8.0   Glucose, UA NEGATIVE  NEGATIVE mg/dL   Hgb urine dipstick NEGATIVE  NEGATIVE   Bilirubin Urine NEGATIVE  NEGATIVE   Ketones, ur NEGATIVE  NEGATIVE mg/dL   Protein, ur NEGATIVE  NEGATIVE mg/dL   Urobilinogen, UA 0.2  0.0 - 1.0 mg/dL   Nitrite NEGATIVE  NEGATIVE   Leukocytes, UA NEGATIVE  NEGATIVE   Comment: MICROSCOPIC NOT DONE ON URINES WITH NEGATIVE PROTEIN, BLOOD, LEUKOCYTES, NITRITE, OR GLUCOSE <1000 mg/dL.    Ct Head Wo Contrast  11/30/2013   CLINICAL DATA:  80-year-old male. Headache. Generalized weakness. Nausea and vomiting with diarrhea.  EXAM: CT HEAD WITHOUT CONTRAST  TECHNIQUE: Contiguous axial images were obtained from the base of the skull through the vertex without intravenous contrast.  COMPARISON:  06/27/2010 sinus CT.  No comparison   head CT.  FINDINGS: No skull fracture or intracranial hemorrhage.  Moderate small vessel disease type changes with findings suggestive of remote small left parietal lobe infarct.  The internal carotid arteries and basilar artery appear slightly dense, not significantly dissimilar from the prior exam as may indicate the presence of thrombus.  Overall, no CT evidence of large acute infarct.  Mild global age-appropriate atrophy without hydrocephalus.  No intracranial mass lesion noted on this unenhanced exam.  Persistent polypoid opacification right sphenoid sinus with minimal polypoid opacification left sphenoid sinus and inferior aspect of the right maxillary sinus.  IMPRESSION: Moderate small vessel disease type changes without CT evidence of large acute infarct as discussed above.  No intracranial hemorrhage.  No intracranial mass lesion noted on this unenhanced exam.  Age-appropriate atrophy without hydrocephalus.  Paranasal sinus opacification as noted above.   Electronically Signed   By: Steve  Olson M.D.   On: 11/30/2013 07:59    ROS Blood pressure 93/37, pulse 74, temperature 98.5 F (36.9 C), temperature source Oral, resp. rate 18, SpO2 90.00%. Physical Exam Physical Examination: General appearance - alert, well appearing, and in no distress Mental status - alert, oriented to person, place, and time Chest - clear to auscultation, no wheezes, rales or rhonchi, symmetric air entry Heart - normal rate, regular rhythm, normal S1, S2, no murmurs, rubs, clicks or gallops Abdomen - soft, nontender, nondistended, no masses or organomegaly Neurological - alert, oriented, normal speech, no focal findings or movement disorder noted Tender left hip. Pain on attempted range of motion. Left leg shortened and slightly rotated   Assessment/Plan: Left femoral neck fracture- Plan left hip hemiarthroplasty. Admitted by medical team and cleared. To OR this afternoon. Discussed with patient who elects to proceed.  Luz Burcher V 11/30/2013, 10:19 AM      

## 2013-11-30 NOTE — Anesthesia Procedure Notes (Signed)
Spinal  Patient location during procedure: OR Staffing Anesthesiologist: Nolon Nations R Performed by: anesthesiologist  Preanesthetic Checklist Completed: patient identified, site marked, surgical consent, pre-op evaluation, timeout performed, IV checked, risks and benefits discussed and monitors and equipment checked Spinal Block Patient position: right lateral decubitus Prep: Betadine Patient monitoring: heart rate, continuous pulse ox and blood pressure Approach: left paramedian Location: L2-3 Injection technique: single-shot Needle Needle type: Spinocan  Needle gauge: 22 G Needle length: 9 cm Assessment Sensory level: T6 Additional Notes Expiration date of kit checked and confirmed. Patient tolerated procedure well, without complications.

## 2013-11-30 NOTE — ED Notes (Signed)
Pt states that he still does not need to urinate but will let us know when he can.

## 2013-11-30 NOTE — Progress Notes (Signed)
UR completed 

## 2013-12-01 ENCOUNTER — Encounter (HOSPITAL_COMMUNITY): Payer: Self-pay | Admitting: Orthopedic Surgery

## 2013-12-01 ENCOUNTER — Encounter (INDEPENDENT_AMBULATORY_CARE_PROVIDER_SITE_OTHER): Payer: Medicare Other | Admitting: Ophthalmology

## 2013-12-01 DIAGNOSIS — IMO0002 Reserved for concepts with insufficient information to code with codable children: Secondary | ICD-10-CM

## 2013-12-01 DIAGNOSIS — I4891 Unspecified atrial fibrillation: Secondary | ICD-10-CM

## 2013-12-01 LAB — CBC
HEMATOCRIT: 35.6 % — AB (ref 39.0–52.0)
Hemoglobin: 11.9 g/dL — ABNORMAL LOW (ref 13.0–17.0)
MCH: 29.1 pg (ref 26.0–34.0)
MCHC: 33.4 g/dL (ref 30.0–36.0)
MCV: 87 fL (ref 78.0–100.0)
Platelets: 282 10*3/uL (ref 150–400)
RBC: 4.09 MIL/uL — AB (ref 4.22–5.81)
RDW: 12.5 % (ref 11.5–15.5)
WBC: 9 10*3/uL (ref 4.0–10.5)

## 2013-12-01 LAB — BASIC METABOLIC PANEL
Anion gap: 14 (ref 5–15)
BUN: 38 mg/dL — ABNORMAL HIGH (ref 6–23)
CO2: 23 meq/L (ref 19–32)
Calcium: 8.7 mg/dL (ref 8.4–10.5)
Chloride: 102 mEq/L (ref 96–112)
Creatinine, Ser: 1.14 mg/dL (ref 0.50–1.35)
GFR calc Af Amer: 63 mL/min — ABNORMAL LOW (ref >90)
GFR calc non Af Amer: 55 mL/min — ABNORMAL LOW (ref >90)
Glucose, Bld: 103 mg/dL — ABNORMAL HIGH (ref 70–99)
Potassium: 3.9 mEq/L (ref 3.7–5.3)
SODIUM: 139 meq/L (ref 137–147)

## 2013-12-01 LAB — URINE CULTURE: Colony Count: 5000

## 2013-12-01 MED ORDER — METHOCARBAMOL 500 MG PO TABS: 500.0000 mg | ORAL_TABLET | Freq: Four times a day (QID) | ORAL | Status: DC | PRN

## 2013-12-01 MED ORDER — ENOXAPARIN SODIUM 40 MG/0.4ML ~~LOC~~ SOLN
40.0000 mg | SUBCUTANEOUS | Status: DC
Start: 2013-12-01 — End: 2014-04-29

## 2013-12-01 MED ORDER — OXYCODONE HCL 5 MG PO TABS: 5.0000 mg | ORAL_TABLET | ORAL | Status: DC | PRN

## 2013-12-01 MED ORDER — TRAMADOL HCL 50 MG PO TABS
50.0000 mg | ORAL_TABLET | Freq: Four times a day (QID) | ORAL | Status: DC | PRN
Start: 2013-12-01 — End: 2014-03-02

## 2013-12-01 MED ORDER — ZOLPIDEM TARTRATE 5 MG PO TABS
5.0000 mg | ORAL_TABLET | Freq: Every evening | ORAL | Status: DC | PRN
  Administered 2013-12-01: 5 mg via ORAL
  Filled 2013-12-01: qty 1

## 2013-12-01 NOTE — Evaluation (Signed)
Occupational Therapy Evaluation Patient Details Name: [name] MRN: [id number] DOB: 10/09/1921 Today's Date: 12/01/2013    History of Present Illness Pt is a 80 year old male admitted after fall at home attempting to put on pants in standing and now s/p L hip hemiarthoplasty.    Clinical Impression   Pt was admitted for L hemiarthroplasty after sustaining a fall. He will benefit from skilled OT to increase safety and independence with adls, following THPS.  Pt was independent prior to admission.  Goals are for overall min A.    Follow Up Recommendations  SNF    Equipment Recommendations  3 in 1 bedside comode    Recommendations for Other Services       Precautions / Restrictions Precautions Precautions: Fall;Posterior Hip Precaution Comments: educated on precautions, provided handout.  Pt recalled 2/3 Restrictions Other Position/Activity Restrictions: WBAT      Mobility Bed Mobility Overal bed mobility: Needs Assistance Bed Mobility: Supine to Sit     Supine to sit: Mod assist Sit to supine: +2 for physical assistance;Mod assist   General bed mobility comments: assist for LEs and guided trunk  Transfers Overall transfer level: Needs assistance Equipment used: Rolling walker (2 wheeled) Transfers: Sit to/from Stand Sit to Stand: Mod assist;+2 physical assistance         General transfer comment: pt recalled sliding L LE out but needed physical assistance to do this    Balance                                            ADL Overall ADL's : Needs assistance/impaired     Grooming: Set up;Sitting   Upper Body Bathing: Set up;Sitting   Lower Body Bathing: Moderate assistance;+2 for safety/equipment;With adaptive equipment;Sit to/from stand   Upper Body Dressing : Set up;Sitting   Lower Body Dressing: Maximal assistance;+2 for safety/equipment;With adaptive equipment;Sit to/from stand;Adhering to hip precautions   Toilet Transfer:  +2 for physical assistance;Moderate assistance;Stand-pivot (recliner to bed)   Toileting- Clothing Manipulation and Hygiene: +2 for physical assistance;Moderate assistance;Sit to/from stand         General ADL Comments: educated on THPs and AE.  Pt recalled 2/3 thps from PT earlier.  Practiced with reacher and sock aide.  Needs reinforcement.  Significant other in room     Vision                     Perception     Praxis      Pertinent Vitals/Pain Pain Assessment: 0-10 Pain Score: 6  Pain Location: L hip Pain Descriptors / Indicators: Sore Pain Intervention(s): Limited activity within patient's tolerance;Repositioned;Ice applied     Hand Dominance     Extremity/Trunk Assessment Upper Extremity Assessment Upper Extremity Assessment: Overall WFL for tasks assessed          Communication Communication Communication: HOH   Cognition Arousal/Alertness: Awake/alert Behavior During Therapy: WFL for tasks assessed/performed Overall Cognitive Status: Within Functional Limits for tasks assessed                     General Comments       Exercises       Shoulder Instructions      Home Living Family/patient expects to be discharged to:: Skilled nursing facility Living Arrangements: Spouse/significant other  Home Equipment: Walker - 2 wheels          Prior Functioning/Environment Level of Independence: Independent        Comments: independent , driving taveling, etc with no AD , likes to stay active and walk and goes to the [facility] to walk laps with his girlfriend/signficant other.     OT Diagnosis: Generalized weakness   OT Problem List: Decreased strength;Decreased activity tolerance;Decreased knowledge of use of DME or AE;Decreased knowledge of precautions;Pain   OT Treatment/Interventions: Self-care/ADL training;DME and/or AE instruction;Patient/family education    OT Goals(Current goals can be found in  the care plan section) Acute Rehab OT Goals Patient Stated Goal: home OT Goal Formulation: With patient Time For Goal Achievement: 12/08/13 Potential to Achieve Goals: Good ADL Goals Pt Will Perform Lower Body Bathing: with min assist;with adaptive equipment;sit to/from stand Pt Will Perform Lower Body Dressing: with min assist;with adaptive equipment;sit to/from stand Pt Will Transfer to Toilet: with min assist;stand pivot transfer;bedside commode Pt Will Perform Toileting - Clothing Manipulation and hygiene: with min assist;sit to/from stand  OT Frequency: Min 2X/week   Barriers to D/C:            Co-evaluation              End of Session    Activity Tolerance: Patient limited by fatigue Patient left: in bed;with call bell/phone within reach;with family/visitor present   Time: 1135-1207 OT Time Calculation (min): 32 min Charges:  OT General Charges $OT Visit: 1 Procedure OT Evaluation $Initial OT Evaluation Tier I: 1 Procedure OT Treatments $Self Care/Home Management : 23-37 mins G-Codes:    Fremon Zacharia 12-06-13, 12:55 PM Lesle Chris, OTR/L [phone] 2013-12-06

## 2013-12-01 NOTE — Progress Notes (Signed)
INITIAL NUTRITION ASSESSMENT  DOCUMENTATION CODES Per approved criteria  -Not Applicable   INTERVENTION: - Ensure Complete BID - Encouraged protein rich foods/beverage to promote strength building  - RD to continue to monitor   NUTRITION DIAGNOSIS: Increased nutrient needs related to left hip hemiarthroplasty as evidenced by MD notes.   Goal: Pt to consume >90% of meals/supplements  Monitor:  Weights, labs, intake   Reason for Assessment: Consult for hip fracture   80 y.o. male  Admitting Dx: nausea and vomiting   ASSESSMENT: Pt with history of paroxysmal atrial fibrillation, Barrett's esophagus, systolic and diastolic CHF, diverticulosis, hyperlipidemia, hypertension presents with left ear pain with associated nausea and vomiting. The patient had a mechanical fall on 11/01/2013 without any syncope. He went to urgent care. Left hip x-rays on 11/02/2013 were negative for any fractures. The patient continued to have pain in his left hip that progressively worsened to the point that he was not able to bear weight. He went back to urgent care or MRI of the left hip was performed and revealed a displaced left femoral neck fracture.  - Had left hip hemiarthroplasty yesterday - Met with pt and Irving Burton who report pt with poor appetite and intake for the past 2-3 weeks with pt eating only bites of food at mealtimes - Pt likes to go to [employer] for breakfast to get either sausage biscuit with gravy or pancakes and then skips lunch and only has a few bites of dinner at [facility] - Weight has been stable - Drinks 1 Ensure/day - Said he didn't eat anything for 3 days PTA - Denies any nausea/vomiting on admission or today - Only ate a few bites of cream of chicken soup today - Did not perform nutrition focused physical exam as pt trying to rest   Height: Ht Readings from Last 1 Encounters:  11/30/13 5\' 5"  (1.651 m)    Weight: Wt Readings from Last 1 Encounters:  11/30/13 157 lb  (71.215 kg)    Ideal Body Weight: 136 lbs   % Ideal Body Weight: 115%  Wt Readings from Last 10 Encounters:  11/30/13 157 lb (71.215 kg)  11/30/13 157 lb (71.215 kg)  11/17/13 157 lb (71.215 kg)  11/02/13 155 lb (70.308 kg)  10/26/13 153 lb (69.4 kg)  10/07/13 156 lb (70.761 kg)  08/31/13 151 lb (68.493 kg)  08/18/13 155 lb (70.308 kg)  08/10/13 151 lb (68.493 kg)  07/25/13 146 lb (66.225 kg)    Usual Body Weight: 157 lbs   % Usual Body Weight: 100%  BMI:  Body mass index is 26.13 kg/(m^2).  Estimated Nutritional Needs: Kcal: 1750-1950 Protein: 80-100g Fluid: 1.7-1.9L/day   Skin: surgical incision on left hip   Diet Order: General  EDUCATION NEEDS: -Education needs addressed - educated pt on protein rich foods to promote strength building and provided handouts of this diet with RD contact information    Intake/Output Summary (Last 24 hours) at 12/01/13 1448 Last data filed at 12/01/13 1045  Gross per 24 hour  Intake 2939.5 ml  Output   2725 ml  Net  214.5 ml    Last BM: 9/7  Labs:   Recent Labs Lab 11/30/13 0615 12/01/13 0440  NA 134* 139  K 3.5* 3.9  CL 97 102  CO2 21 23  BUN 51* 38*  CREATININE 1.63* 1.14  CALCIUM 7.8* 8.7  GLUCOSE 101* 103*    CBG (last 3)  No results found for this basename: GLUCAP,  in the last  72 hours  Scheduled Meds: . docusate sodium  100 mg Oral BID  . enoxaparin (LOVENOX) injection  40 mg Subcutaneous Q24H  . feeding supplement (ENSURE COMPLETE)  237 mL Oral QHS  . pantoprazole  80 mg Oral Q1200  . simvastatin  20 mg Oral Daily  . tamsulosin  0.4 mg Oral Daily    Continuous Infusions: . sodium chloride 0.9 % 1,000 mL infusion Stopped (12/01/13 0717)    Past Medical History  Diagnosis Date  . BARRETTS ESOPHAGUS 11/19/2007  . CARDIOMYOPATHY 08/02/2008  . CHANGE IN BOWELS 02/01/2009  . CHF 08/10/2008  . Diarrhea 01/31/2009  . DIVERTICULOSIS, COLON 11/19/2007  . GASTRITIS 11/19/2007  . GERD 11/19/2007  .  HIATAL HERNIA 11/19/2007  . HYPERLIPIDEMIA 06/27/2009  . HYPOTHYROIDISM 06/27/2009  . MELENA 08/01/2009  . PERSONAL HX COLONIC POLYPS 02/01/2009  . TINEA CORPORIS 06/27/2009  . TOBACCO USE, QUIT 06/27/2009  . TREMOR, ESSENTIAL 06/27/2009  . HTN (hypertension)   . IBS (irritable bowel syndrome)   . Heart murmur   . Blood transfusion without reported diagnosis     Past Surgical History  Procedure Laterality Date  . Cholecystectomy    . Lung surgery  1984  . Umbilical hernia repair    . Tonsillectomy    . Vasectomy    . Esophagogastroduodenoscopy N/A 04/28/2013    Procedure: ESOPHAGOGASTRODUODENOSCOPY (EGD);  Surgeon: Jerene Bears, MD;  Location: [facility];  Service: Gastroenterology;  Laterality: N/A;  . Tonsillectomy    . Hernia repair      Carlis Stable MS, RD, LDN [phone] Pager [phone] Weekend/After Hours Pager

## 2013-12-01 NOTE — Telephone Encounter (Signed)
Referral has been sent tt [facility] this moring

## 2013-12-01 NOTE — Progress Notes (Signed)
Dr Tat made aware that patients beta blocker had not been reordered since surgery.  Bethann Punches RN

## 2013-12-01 NOTE — Progress Notes (Signed)
PROGRESS NOTE  [name] NFA:[id number] DOB: Jul 25, 1921 DOA: 11/30/2013 PCP: Kennon Portela, MD  Interim Summary 80 year old male with a history of paroxysmal atrial fibrillation, Barrett's esophagus, systolic and diastolic CHF, diverticulosis, hyperlipidemia, hypertension presents with left hip pain with associated nausea and vomiting. The patient had a mechanical fall on 11/01/2013 without any syncope. He went to urgent care. Left hip x-rays on 11/02/2013 were negative for any fractures. The patient continued to have pain in his left hip that progressively worsened to the point that he was not able to bear weight. He went back to urgent care or MRI of the left hip was performed and revealed a displaced left femoral neck fracture. The patient was admitted on 11/30/2013. Orthopedics was consulted and left hip hemiarthroplasty was performed.  Assessment/Plan: left femoral neck fracture  -ortho has already been called--Dr. 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 Hilarie Fredrickson  -continue PPI  Hyponatremia  -Secondary to volume depletion  -Judicious IV fluids-->improved  Leukocytosis  -Likely stress demargination  -Patient is afebrile  -improved   Family Communication:   Wife updated  Disposition Plan:  SNF in 1 to 2 days when family finds appropriate  place       Procedures/Studies: Dg Hip Complete Left  11/02/2013   CLINICAL DATA:  Pain post trauma  EXAM: LEFT HIP - COMPLETE 2+ VIEW  COMPARISON:  None.  FINDINGS: Frontal pelvis as well as frontal and lateral left hip images were obtained. There is no demonstrable fracture or dislocation. There is moderate symmetric narrowing of both hip joints. No erosive change. Bones are osteoporotic.  IMPRESSION: Moderate narrowing both hip joints. Bones osteoporotic. No fracture or dislocation. If there remains concern for potential fracture obscured by the osteoporosis, MR would be the imaging study of choice to further assess.   Electronically Signed   By: Lowella Grip M.D.   On: 11/02/2013 10:20   Ct Head Wo Contrast  11/30/2013   CLINICAL DATA:  80 year old male. Headache. Generalized weakness. Nausea and vomiting with diarrhea.  EXAM: CT HEAD WITHOUT CONTRAST  TECHNIQUE: Contiguous axial images were obtained from the base of the skull through the vertex without intravenous contrast.  COMPARISON:  06/27/2010 sinus CT.  No comparison head CT.  FINDINGS: No skull fracture or intracranial hemorrhage.  Moderate small vessel disease type changes with findings suggestive of remote small left parietal lobe infarct.  The internal carotid arteries and basilar artery appear slightly dense, not significantly dissimilar from the prior exam as may indicate the presence of thrombus. Overall, no CT evidence of large acute infarct.  Mild global age-appropriate atrophy without hydrocephalus.  No intracranial mass lesion noted on this unenhanced exam.  Persistent polypoid opacification right sphenoid sinus with minimal polypoid opacification left sphenoid sinus and inferior aspect of the right maxillary sinus.  IMPRESSION: Moderate small vessel disease type changes without CT evidence of large acute  infarct as discussed above.  No intracranial hemorrhage.  No intracranial mass lesion noted on this unenhanced exam.   Age-appropriate atrophy without hydrocephalus.  Paranasal sinus opacification as noted above.   Electronically Signed   By: Chauncey Cruel M.D.   On: 11/30/2013 07:59   Mr Hip Left W Wo Contrast  11/26/2013   CLINICAL DATA:  Left leg pain. The patient cannot walk. Fall, initial encounter. Left hip fracture.  EXAM: MRI OF THE LEFT HIP WITHOUT AND WITH CONTRAST  TECHNIQUE: Multiplanar, multisequence MR imaging was performed both before and after administration of intravenous contrast.  CONTRAST:  32mL MULTIHANCE GADOBENATE DIMEGLUMINE 529 MG/ML IV SOLN  COMPARISON:  11/02/2013  FINDINGS: Bones: Acute completed left femoral neck fracture, varus angulation, with considerable marrow edema along the fracture plane. Mild overlap and lateral displacement of the proximal femur with respect to the femoral head. As expected there is considerable surrounding soft tissue edema. Small left hip effusion. Edema and surrounding musculature and tracking in the left hip adductor musculature.  No sciatic nerve impingement. Low-level edema in the distal iliacus muscle of the iliopsoas appears intact distally.  Proximal hamstring tendons intact. No other fracture in the region. Lower lumbar spondylosis.  IMPRESSION: 1. Acute moderately displaced left femoral neck fracture with varus angulation. Considerable surrounding marrow and soft tissue edema. 2. Lower lumbar spondylosis.   Electronically Signed   By: Sherryl Barters M.D.   On: 11/26/2013 12:13         Subjective: Patient denies fevers, chills, headache, chest pain, dyspnea, nausea, vomiting, diarrhea, abdominal pain, dysuria, hematuria   Objective: Filed Vitals:   12/01/13 0257 12/01/13 0527 12/01/13 1021 12/01/13 1338  BP: 97/55 109/57 96/56 97/57   Pulse: 74 82 78 100  Temp: 99.3 F (37.4 C) 97.5 F (36.4 C) 98.5 F (36.9 C) 99.1 F (37.3 C)  TempSrc: Oral Oral    Resp: 16 15 16 16   Height:      Weight:      SpO2: 93% 95% 97% 97%    Intake/Output  Summary (Last 24 hours) at 12/01/13 1924 Last data filed at 12/01/13 1200  Gross per 24 hour  Intake 1980.75 ml  Output   1395 ml  Net 585.75 ml   Weight change:  Exam:   General:  Pt is alert, follows commands appropriately, not in acute distress  HEENT: No icterus, No thrush,[place]/AT  Cardiovascular: RRR, S1/S2, no rubs, no gallops  Respiratory: CTA bilaterally, no wheezing, no crackles, no rhonchi  Abdomen: Soft/+BS, non tender, non distended, no guarding  Extremities: No edema, No lymphangitis, No petechiae, No rashes, no synovitis  Data Reviewed: Basic Metabolic Panel:  Recent Labs Lab 11/30/13 0615 12/01/13 0440  NA 134* 139  K 3.5* 3.9  CL 97 102  CO2 21 23  GLUCOSE 101* 103*  BUN 51* 38*  CREATININE 1.63* 1.14  CALCIUM 7.8* 8.7   Liver Function Tests:  Recent Labs Lab 11/30/13 0615  AST 13  ALT 9  ALKPHOS 107  BILITOT 0.4  PROT 6.0  ALBUMIN 2.7*    Recent Labs Lab 11/30/13 0613  LIPASE 24   No results found for this basename: AMMONIA,  in the last 168 hours CBC:  Recent Labs Lab 11/30/13 0615 12/01/13 0440  WBC 15.1* 9.0  HGB 11.6* 11.9*  HCT 33.9* 35.6*  MCV 87.1 87.0  PLT 257 282   Cardiac Enzymes: No results found for this basename: CKTOTAL, CKMB, CKMBINDEX, TROPONINI,  in the last 168 hours BNP:  No components found with this basename: POCBNP,  CBG: No results found for this basename: GLUCAP,  in the last 168 hours  Recent Results (from the past 240 hour(s))  URINE CULTURE     Status: None   Collection Time    11/30/13  8:04 AM      Result Value Ref Range Status   Specimen Description URINE, CLEAN CATCH   Final   Special Requests NONE   Final   Culture  Setup Time     Final   Value: 11/30/2013 14:21     Performed at [facility] Count     Final   Value: 5,000 COLONIES/ML     Performed at [facility]   Culture     Final   Value: INSIGNIFICANT GROWTH     Performed at [facility]    Report Status 12/01/2013 FINAL   Final  SURGICAL PCR SCREEN     Status: None   Collection Time    11/30/13  1:06 PM      Result Value Ref Range Status   MRSA, PCR NEGATIVE  NEGATIVE Final   Staphylococcus aureus NEGATIVE  NEGATIVE Final   Comment:            The Xpert SA Assay (FDA     approved for NASAL specimens     in patients over 32 years of age),     is one component of     a comprehensive surveillance     program.  Test performance has     been validated by [employer] for patients greater     than or equal to 48 year old.     It is not intended     to diagnose infection nor to     guide or monitor treatment.     Scheduled Meds: . docusate sodium  100 mg Oral BID  . enoxaparin (LOVENOX) injection  40 mg Subcutaneous Q24H  . feeding supplement (ENSURE COMPLETE)  237 mL Oral QHS  . pantoprazole  80 mg Oral Q1200  . simvastatin  20 mg Oral Daily  . tamsulosin  0.4 mg Oral Daily   Continuous Infusions: . sodium chloride 0.9 % 1,000 mL infusion Stopped (12/01/13 0717)     Reianna Batdorf, DO  [facility] Pager [phone]  If 7PM-7AM, please contact night-coverage [email or web address] Password [email or web address] 12/01/2013, 7:24 PM   LOS: 1 day

## 2013-12-01 NOTE — Discharge Instructions (Signed)
Hip Fracture °A hip fracture is a fracture of the upper part of your thigh bone (femur).  °CAUSES °A hip fracture is caused by a direct blow to the side of your hip. This is usually the result of a fall but can occur in other circumstances, such as an automobile accident. °RISK FACTORS °There is an increased risk of hip fractures in people with: °· An unsteady walking pattern (gait) and those with conditions that contribute to poor balance, such as Parkinson's disease or dementia. °· Osteopenia and osteoporosis. °· Cancer that spreads to the leg bones. °· Certain metabolic diseases. °SYMPTOMS  °Symptoms of hip fracture include: °· Pain over the injured hip. °· Inability to put weight on the leg in which the fracture occurred (although, some patients are able to walk after a hip fracture). °· Toes and foot of the affected leg point outward when you lie down. °DIAGNOSIS °A physical exam can determine if a hip fracture is likely to have occurred. X-ray exams are needed to confirm the fracture and to look for other injuries. The X-ray exam can help to determine the type of hip fracture. Rarely, the fracture is not visible on an X-ray image and a CT scan or MRI will have to be done. °TREATMENT  °The treatment for a fracture is usually surgery. This means using a screw, nail, or rod to hold the bones in place.  °HOME CARE INSTRUCTIONS °Take all medicines as directed by your health care provider. °SEEK MEDICAL CARE IF: °Pain continues, even after taking pain medicine. °MAKE SURE YOU: °· Understand these instructions.   °· Will watch your condition. °· Will get help right away if you are not doing well or get worse. °Document Released: 03/12/2005 Document Revised: 03/17/2013 Document Reviewed: 10/22/2012 °ExitCare® Patient Information ©2015 [employer]. This information is not intended to replace advice given to you by your health care provider. Make sure you discuss any questions you have with your health care  provider. ° °Partial Hip Replacement °The hip joint attaches the leg to the pelvis. The joint moves and supports you when you walk. The top of the thighbone (femoral head) is in the shape of a ball. Part of the lower pelvis (acetabulum) forms a cup-shaped socket. The ball and socket are attached with bands called ligaments. Smooth tissue (cartilage) lines the surface of the ball and socket to keep the joint cushioned and to help it move. Lubricating fluid (synovial fluid) also covers joint surfaces to keep the joint healthy. °A partial hip replacement most often refers to a surgery to replace only the ball part of the joint. A man-made metal material (prosthesis) replaces the worn femoral head. This surgery is often performed to alleviate the pain of arthritis or a break (fracture). It is most commonly performed for a fractured hip. °LET YOUR CAREGIVER KNOW ABOUT:  °· Allergies to food or medicine. °· Medicines taken, including vitamins, herbs, eyedrops, over-the-counter medicines, and creams. °· Use of steroids (by mouth or creams). °· Previous problems with anesthetics or numbing medicines. °· History of bleeding problems or blood clots. °· Previous surgery. °· Other health problems, including diabetes and kidney problems. °· Possibility of pregnancy, if this applies. °RISKS AND COMPLICATIONS  °As with any surgery, complications may occur. However, they can usually be managed by your caregiver. General surgical complications may include: °· Reaction to anesthesia. °· Damage to surrounding nerves, tissues, or structures. °· Infection. °· Blood clot. °· Bleeding. °· Scarring. °Complications related to this surgery may include: °·   Prosthesis not staying attached to the bone. °· Joint dislocation. °· Continued stiffness or loss of mobility. °· Continued pain. °BEFORE THE PROCEDURE  °It is important to follow your caregiver's instructions prior to your surgery to avoid complications. Steps before your surgery may  include: °· Physical exam, blood and urine tests, X-rays, a computerized magnetic scan (MRI), bone scans, or heart tests (cardiogram or chest X-ray). °· Your caregiver will review with you the surgery, the anesthesia being used, and what to expect after the surgery. °You may be asked to: °· Stop taking certain medicines for several days prior to your surgery, such as blood thinners (including aspirin). Your caregiver will let you know which medicines you may continue. °· Lose weight. °· Seek treatment for any dental conditions. °· Avoid eating and drinking at least 8 hours before the surgery. This will help you avoid complications from anesthesia. °· Take an antibacterial shower the night before or the morning of the surgery. °· Quit smoking, if this applies. Smoking increases the chances of a healing problem after your surgery. If you are thinking about quitting, ask your surgeon how long before the surgery you should stop smoking. Ask your primary caregiver about approaches to help you stop. This can include medicines. °· Arrange for someone to help you with activities during recovery. Talk with your caregiver about the need for extended care in a facility. °PROCEDURE  °The surgery is done after you are given medicine that makes you sleep (general anesthetic) or medicine that makes you numb from the waist down (spinal anesthetic). You will be asleep and will not feel any pain. The surgeon will make a cut (incision) in the hip to access the joint. The top of the thighbone that contains the ball is removed. The surgeon secures a new prosthetic ball joint into the healthy socket. The nearby bone may grow into the prosthesis to hold it in place, or cement (methacrylate) may be used. The incision is closed with stitches (sutures). The surgery will take several hours to complete. °AFTER THE PROCEDURE  °· You will most likely be in the hospital for 2 to 4 days following surgery. °· You will need to lie in bed and only  move as directed. °· Caregivers will help you manage pain and swelling with medicines. °· You will be asked to perform breathing exercises. °· A splint or brace may be applied to the hip. °· You will learn specific exercises and how to walk with support. This happens within hours after surgery or the following day. A physical therapist will help you. °· You will need to follow your caregiver's instructions for home care after surgery. °· Recovery generally takes 3 to 6 months. °· You may be asked to limit how often you bend at the waist for 6 weeks. °Document Released: 08/30/2009 Document Revised: 06/04/2011 Document Reviewed: 07/14/2013 °ExitCare® Patient Information ©2015 [employer]. This information is not intended to replace advice given to you by your health care provider. Make sure you discuss any questions you have with your health care provider. ° °

## 2013-12-01 NOTE — Progress Notes (Signed)
Per previous note 8am not 8 pm. Dr Tat made aware patients beta blocker was not ordered post op  D Mateo Flow RN

## 2013-12-01 NOTE — Evaluation (Signed)
Physical Therapy Evaluation Patient Details Name: [name] MRN: [id number] DOB: 09-23-1921 Today's Date: 12/01/2013   History of Present Illness  Pt is a 80 year old male admitted after fall at home attempting to put on pants in standing and now s/p L hip hemiarthoplasty.    Clinical Impression  Patient is s/p L hip hemiarthroplasty surgery resulting in functional limitations due to the deficits listed below (see PT Problem List).  Patient will benefit from skilled PT to increase their independence and safety with mobility to allow discharge to the venue listed below.       Follow Up Recommendations SNF    Equipment Recommendations  None recommended by PT    Recommendations for Other Services       Precautions / Restrictions Precautions Precautions: Fall;Posterior Hip Precaution Comments: educated on precautions, provided handout Restrictions Other Position/Activity Restrictions: WBAT      Mobility  Bed Mobility Overal bed mobility: Needs Assistance Bed Mobility: Supine to Sit     Supine to sit: Mod assist     General bed mobility comments: verbal cues for technique, assist for L LE and trunk upright  Transfers Overall transfer level: Needs assistance Equipment used: Rolling walker (2 wheeled) Transfers: Sit to/from Stand Sit to Stand: Mod assist;+2 physical assistance         General transfer comment: verbal cues for safe technique within precautions  Ambulation/Gait Ambulation/Gait assistance: Mod assist;+2 physical assistance Ambulation Distance (Feet): 2 Feet Assistive device: Rolling walker (2 wheeled) Gait Pattern/deviations: Step-to pattern;Antalgic;Trunk flexed Gait velocity: decr   General Gait Details: initiated gait with verbal cues for sequence, step length, RW distance however pt reported feeling "woozy" so assisted to Physicist, medical    Modified Rankin (Stroke Patients Only)       Balance                                              Pertinent Vitals/Pain Pain Assessment: 0-10 Pain Score: 8  Pain Location: L hip with movement Pain Descriptors / Indicators: Sore;Aching Pain Intervention(s): Limited activity within patient's tolerance;Monitored during session;Repositioned;Ice applied    Home Living Family/patient expects to be discharged to:: Skilled nursing facility Living Arrangements: Spouse/significant other             Home Equipment: Walker - 2 wheels      Prior Function Level of Independence: Independent         Comments: independent , driving taveling, etc with no AD , likes to stay active and walk and goes to the [facility] to walk laps with his girlfriend/signficant other.      Hand Dominance        Extremity/Trunk Assessment               Lower Extremity Assessment: LLE deficits/detail   LLE Deficits / Details: decreased active movement due to pain, required assist for mobility     Communication   Communication: HOH  Cognition Arousal/Alertness: Awake/alert Behavior During Therapy: WFL for tasks assessed/performed Overall Cognitive Status: Within Functional Limits for tasks assessed                      General Comments      Exercises        Assessment/Plan    PT  Assessment Patient needs continued PT services  PT Diagnosis Difficulty walking;Acute pain   PT Problem List Decreased strength;Decreased activity tolerance;Decreased mobility;Decreased knowledge of precautions;Decreased knowledge of use of DME;Pain  PT Treatment Interventions DME instruction;Gait training;Functional mobility training;Therapeutic activities;Therapeutic exercise;Patient/family education   PT Goals (Current goals can be found in the Care Plan section) Acute Rehab PT Goals PT Goal Formulation: With patient Time For Goal Achievement: 12/08/13 Potential to Achieve Goals: Good    Frequency Min 3X/week   Barriers to discharge         Co-evaluation               End of Session Equipment Utilized During Treatment: Gait belt Activity Tolerance: Patient limited by pain;Patient limited by fatigue Patient left: in chair;with call bell/phone within reach;with family/visitor present           Time: 1308-6578 PT Time Calculation (min): 14 min   Charges:   PT Evaluation $Initial PT Evaluation Tier I: 1 Procedure PT Treatments $Therapeutic Activity: 8-22 mins   PT G Codes:          Ligaya Cormier,KATHrine E 12/01/2013, 10:49 AM Carmelia Bake, PT, DPT 12/01/2013 Pager: [phone]

## 2013-12-01 NOTE — Progress Notes (Signed)
   Subjective: 1 Day Post-Op Procedure(s) (LRB): ARTHROPLASTY BIPOLAR HIP (Left) Patient reports pain as mild and moderate.   Patient seen in rounds with Dr. Wynelle Link. Patient is well, but has had some minor complaints of pain in the hip, requiring pain medications We will start therapy today.  Plan is to go Skilled nursing facility after hospital stay.  Objective: Vital signs in last 24 hours: Temp:  [97.5 F (36.4 C)-99.3 F (37.4 C)] 97.5 F (36.4 C) (09/08 0527) Pulse Rate:  [57-82] 82 (09/08 0527) Resp:  [14-18] 15 (09/08 0527) BP: (88-147)/(37-76) 109/57 mmHg (09/08 0527) SpO2:  [90 %-100 %] 95 % (09/08 0527) Weight:  [71.215 kg (157 lb)] 71.215 kg (157 lb) (09/07 1232)  Intake/Output from previous day:  Intake/Output Summary (Last 24 hours) at 12/01/13 0759 Last data filed at 12/01/13 0600  Gross per 24 hour  Intake 2739.5 ml  Output   2525 ml  Net  214.5 ml    Intake/Output this shift:    Labs:  Recent Labs  11/30/13 0615 12/01/13 0440  HGB 11.6* 11.9*    Recent Labs  11/30/13 0615 12/01/13 0440  WBC 15.1* 9.0  RBC 3.89* 4.09*  HCT 33.9* 35.6*  PLT 257 282    Recent Labs  11/30/13 0615 12/01/13 0440  NA 134* 139  K 3.5* 3.9  CL 97 102  CO2 21 23  BUN 51* 38*  CREATININE 1.63* 1.14  GLUCOSE 101* 103*  CALCIUM 7.8* 8.7    Recent Labs  11/30/13 0802  INR 0.99    EXAM General - Patient is Alert and Appropriate Extremity - Neurovascular intact Sensation intact distally Dressing - dressing C/D/I Motor Function - intact, moving foot and toes well on exam.  Hemovac pulled without difficulty.  Past Medical History  Diagnosis Date  . BARRETTS ESOPHAGUS 11/19/2007  . CARDIOMYOPATHY 08/02/2008  . CHANGE IN BOWELS 02/01/2009  . CHF 08/10/2008  . Diarrhea 01/31/2009  . DIVERTICULOSIS, COLON 11/19/2007  . GASTRITIS 11/19/2007  . GERD 11/19/2007  . HIATAL HERNIA 11/19/2007  . HYPERLIPIDEMIA 06/27/2009  . HYPOTHYROIDISM 06/27/2009  . MELENA  08/01/2009  . PERSONAL HX COLONIC POLYPS 02/01/2009  . TINEA CORPORIS 06/27/2009  . TOBACCO USE, QUIT 06/27/2009  . TREMOR, ESSENTIAL 06/27/2009  . HTN (hypertension)   . IBS (irritable bowel syndrome)   . Heart murmur   . Blood transfusion without reported diagnosis     Assessment/Plan: 1 Day Post-Op Procedure(s) (LRB): ARTHROPLASTY BIPOLAR HIP (Left) Active Problems:   BARRETTS ESOPHAGUS   Protein-calorie malnutrition, severe   A-fib   Femoral neck fracture   Acute on chronic renal failure   Chronic combined systolic and diastolic CHF (congestive heart failure)  Estimated body mass index is 26.13 kg/(m^2) as calculated from the following:   Height as of this encounter: 5\' 5"  (1.651 m).   Weight as of this encounter: 71.215 kg (157 lb). Advance diet Up with therapy Discharge to SNF when medically stable  DVT Prophylaxis - Lovenox for ten days and then followed by Aspirin Weight Bearing As Tolerated left Leg D/C Knee Immobilizer Hemovac Pulled Begin Therapy Hip Preacutions F/U with Dr. Wynelle Link in two weeks  Arlee Muslim, PA-C Orthopaedic Surgery 12/01/2013, 7:59 AM

## 2013-12-01 NOTE — Care Management Note (Signed)
    Page 1 of 1   12/01/2013     11:55:56 AM CARE MANAGEMENT NOTE 12/01/2013  Patient:  [name], [name]   Account Number:  [id number]  Date Initiated:  12/01/2013  Documentation initiated by:  [facility]  Subjective/Objective Assessment:   adm: Left hip fracture     Action/Plan:   SNF for rehab   Anticipated DC Date:  12/02/2013   Anticipated DC Plan:  SKILLED NURSING FACILITY         Choice offered to / List presented to:             Status of service:  Completed, signed off Medicare Important Message given?   (If response is "NO", the following Medicare IM given date fields will be blank) Date Medicare IM given:   Medicare IM given by:   Date Additional Medicare IM given:   Additional Medicare IM given by:    Discharge Disposition:  [place]  Per UR Regulation:    If discussed at Long Length of Stay Meetings, dates discussed:    Comments:  12/01/13 11:54 CM notes recc is for SNF; CSW arranging SNF. No other CM needs were communicated.  Mariane Masters, BSN, CM [phone].

## 2013-12-02 DIAGNOSIS — I10 Essential (primary) hypertension: Secondary | ICD-10-CM

## 2013-12-02 DIAGNOSIS — D72829 Elevated white blood cell count, unspecified: Secondary | ICD-10-CM | POA: Diagnosis present

## 2013-12-02 LAB — CBC
HEMATOCRIT: 34.1 % — AB (ref 39.0–52.0)
HEMOGLOBIN: 11.6 g/dL — AB (ref 13.0–17.0)
MCH: 29.8 pg (ref 26.0–34.0)
MCHC: 34 g/dL (ref 30.0–36.0)
MCV: 87.7 fL (ref 78.0–100.0)
Platelets: 274 10*3/uL (ref 150–400)
RBC: 3.89 MIL/uL — AB (ref 4.22–5.81)
RDW: 12.8 % (ref 11.5–15.5)
WBC: 10.3 10*3/uL (ref 4.0–10.5)

## 2013-12-02 LAB — BASIC METABOLIC PANEL
Anion gap: 12 (ref 5–15)
BUN: 32 mg/dL — AB (ref 6–23)
CALCIUM: 8.7 mg/dL (ref 8.4–10.5)
CO2: 23 meq/L (ref 19–32)
Chloride: 97 mEq/L (ref 96–112)
Creatinine, Ser: 1.01 mg/dL (ref 0.50–1.35)
GFR calc Af Amer: 73 mL/min — ABNORMAL LOW (ref >90)
GFR, EST NON AFRICAN AMERICAN: 63 mL/min — AB (ref >90)
GLUCOSE: 137 mg/dL — AB (ref 70–99)
Potassium: 4.5 mEq/L (ref 3.7–5.3)
Sodium: 132 mEq/L — ABNORMAL LOW (ref 137–147)

## 2013-12-02 NOTE — Progress Notes (Signed)
   Subjective: 2 Days Post-Op Procedure(s) (LRB): ARTHROPLASTY BIPOLAR HIP (Left) Patient reports pain as mild.   Patient seen in rounds by Dr. Wynelle Link.  He has very minimal appetite today.  Was unable to void requiring I&O cath last night. Probable needs one more day Patient is having problems with decreased appetite and trouble urinating Plan is to go Skilled nursing facility after hospital stay.  Objective: Vital signs in last 24 hours: Temp:  [98 F (36.7 C)-99.1 F (37.3 C)] 98 F (36.7 C) (09/09 0431) Pulse Rate:  [78-100] 79 (09/09 0431) Resp:  [16-18] 16 (09/09 0431) BP: (96-113)/(53-62) 113/53 mmHg (09/09 0431) SpO2:  [90 %-97 %] 91 % (09/09 0431)  Intake/Output from previous day:  Intake/Output Summary (Last 24 hours) at 12/02/13 0713 Last data filed at 12/02/13 0645  Gross per 24 hour  Intake    680 ml  Output    675 ml  Net      5 ml    Intake/Output this shift:    Labs:  Recent Labs  11/30/13 0615 12/01/13 0440 12/02/13 0415  HGB 11.6* 11.9* 11.6*    Recent Labs  12/01/13 0440 12/02/13 0415  WBC 9.0 10.3  RBC 4.09* 3.89*  HCT 35.6* 34.1*  PLT 282 274    Recent Labs  12/01/13 0440 12/02/13 0415  NA 139 132*  K 3.9 4.5  CL 102 97  CO2 23 23  BUN 38* 32*  CREATININE 1.14 1.01  GLUCOSE 103* 137*  CALCIUM 8.7 8.7    Recent Labs  11/30/13 0802  INR 0.99    EXAM General - Patient is Alert and Appropriate Extremity - Neurovascular intact Sensation intact distally Dressing/Incision - clean, dry Motor Function - intact, moving foot and toes well on exam.   Past Medical History  Diagnosis Date  . BARRETTS ESOPHAGUS 11/19/2007  . CARDIOMYOPATHY 08/02/2008  . CHANGE IN BOWELS 02/01/2009  . CHF 08/10/2008  . Diarrhea 01/31/2009  . DIVERTICULOSIS, COLON 11/19/2007  . GASTRITIS 11/19/2007  . GERD 11/19/2007  . HIATAL HERNIA 11/19/2007  . HYPERLIPIDEMIA 06/27/2009  . HYPOTHYROIDISM 06/27/2009  . MELENA 08/01/2009  . PERSONAL HX COLONIC POLYPS  02/01/2009  . TINEA CORPORIS 06/27/2009  . TOBACCO USE, QUIT 06/27/2009  . TREMOR, ESSENTIAL 06/27/2009  . HTN (hypertension)   . IBS (irritable bowel syndrome)   . Heart murmur   . Blood transfusion without reported diagnosis     Assessment/Plan: 2 Days Post-Op Procedure(s) (LRB): ARTHROPLASTY BIPOLAR HIP (Left) Active Problems:   BARRETTS ESOPHAGUS   Protein-calorie malnutrition, severe   A-fib   Femoral neck fracture   Acute on chronic renal failure   Chronic combined systolic and diastolic CHF (congestive heart failure)  Estimated body mass index is 26.13 kg/(m^2) as calculated from the following:   Height as of this encounter: 5\' 5"  (1.651 m).   Weight as of this encounter: 71.215 kg (157 lb). Up with therapy Discharge to SNF - probably needs one more day.  DVT Prophylaxis - Lovenox for ten days total and then followed by Aspirin RX on chart in wall Weight Bearing As Tolerated left Leg Hip Preacutions  F/U with Dr. Wynelle Link in two weeks  Arlee Muslim, PA-C Orthopaedic Surgery 12/02/2013, 7:13 AM

## 2013-12-02 NOTE — Progress Notes (Signed)
Physical Therapy Treatment Patient Details Name: [name] MRN: [id number] DOB: 12-Jun-1921 Today's Date: 12/02/2013    History of Present Illness Pt is a 80 year old male admitted after fall at home attempting to put on pants in standing and now s/p L hip hemiarthoplasty.     PT Comments      Follow Up Recommendations  SNF     Equipment Recommendations       Recommendations for Other Services       Precautions / Restrictions Precautions Precautions: Fall;Posterior Hip Precaution Comments: educated on precautions, provided handout Restrictions Weight Bearing Restrictions: No Other Position/Activity Restrictions: WBAT    Mobility  Bed Mobility Overal bed mobility: Needs Assistance Bed Mobility: Supine to Sit     Supine to sit: Mod assist Sit to supine: +2 for physical assistance;Mod assist   General bed mobility comments: assist for LEs and guided trunk plus increased time and 75% VC's on proper tech  Transfers Overall transfer level: Needs assistance Equipment used: Rolling walker (2 wheeled) Transfers: Sit to/from Stand Sit to Stand: Mod assist;+2 physical assistance         General transfer comment: 75% VC's on proper tech, hand placement and to avoid hip flex > 90 degrees.  Very unsteady with mod c/o dizziness and some fear/anxiety.  Ambulation/Gait Ambulation/Gait assistance: Mod assist;+2 physical assistance Ambulation Distance (Feet): 5 Feet Assistive device: Rolling walker (2 wheeled) Gait Pattern/deviations: Step-to pattern;Trunk flexed;Decreased stance time - left Gait velocity: decr   General Gait Details: 75% VC's on proper sequencing, proper walker to self distance, proper step length and increased time.  Pt c/o mod dizziness.     Stairs            Wheelchair Mobility    Modified Rankin (Stroke Patients Only)       Balance                                    Cognition                             Exercises      General Comments        Pertinent Vitals/Pain      Home Living                      Prior Function            PT Goals (current goals can now be found in the care plan section) Progress towards PT goals: Progressing toward goals    Frequency       PT Plan      Co-evaluation             End of Session Equipment Utilized During Treatment: Gait belt Activity Tolerance: Patient limited by fatigue Patient left: in chair;with call bell/phone within reach;with family/visitor present     Time: 0950-1020 PT Time Calculation (min): 30 min  Charges:  $Gait Training: 8-22 mins $Therapeutic Activity: 8-22 mins                    G Codes:      Rica Koyanagi  PTA [facility]  Acute  Rehab Pager      [phone]

## 2013-12-02 NOTE — Progress Notes (Signed)
CSW assisting with d/c planning. Pt / son have chosen [facility] [employer]. SNF bed will be available Thursday if pt is stable for d/c.   Roselyn Reef Nahuel Wilbert LCSW [phone]

## 2013-12-02 NOTE — Progress Notes (Signed)
[facility] PROGRESS NOTE  [name] EZM:[id number] DOB: April 15, 1921 DOA: 11/30/2013 PCP: Kennon Portela, MD  Assessment/Plan: #1 displaced left femoral neck fracture Secondary to mechanical fall. Status post left hip hemiarthroplasty. Weight bearing as tolerated. Per orthopedics.  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 Baseline creatinine 1-1.3. Improved with hydration. ACE inhibitor and diuretic on hold. Follow.  #3 chronic combined systolic and diastolic CHF 2-D echo from 07/13/2013 with EF of 45% with grade 1 diastolic dysfunction. Currently euvolemic. Diuretics on hold. Follow.  #4 hypertension Blood pressure was borderline and a such antihypertensives on hold. Follow.  #5 history of paroxysmal atrial fibrillation Currently in normal sinus rhythm. Due to high fall risk patient was deemed not anticoagulation candidate and supposed to be on aspirin daily. Patient currently on Lovenox per orthopedics for DVT prophylaxis and that will be on aspirin.  #6 Barretts esophagus Stable. Continue PPI.  #7 hyponatremia Likely secondary to volume depletion. Improved with hydration.  #8 leukocytosis Likely reactive leukocytosis secondary to problem #1. Patient currently afebrile. Urinalysis was negative. Patient with no respiratory symptoms. WBC trending down. Follow.  #9 prophylaxis PPI for GI prophylaxis. Lovenox for DVT prophylaxis for 10 days followed by aspirin per orthopedics.  Code Status: Full Family Communication: Updated patient and significant other at bedside. Disposition Plan: SNF when medically stable hopefully tomorrow.   Consultants:  Orthopedics: Dr Wynelle Link 11/30/2013  Procedures:  CT head without contrast 11/30/2013  Left hip hemiarthroplasty per Dr. Wynelle Link 11/30/2013  Antibiotics:  None  HPI/Subjective: Patient states  only has a little bit of pain when being moved around. Patient states pain is managed.  Objective: Filed Vitals:   12/02/13 0431  BP: 113/53  Pulse: 79  Temp: 98 F (36.7 C)  Resp: 16    Intake/Output Summary (Last 24 hours) at 12/02/13 1016 Last data filed at 12/02/13 0904  Gross per 24 hour  Intake    840 ml  Output    675 ml  Net    165 ml   Filed Weights   11/30/13 1232  Weight: 71.215 kg (157 lb)    Exam:   General:  NAD  Cardiovascular: RRR  Respiratory: CTAB  Abdomen: Soft, nontender, nondistended, positive bowel sounds.  Musculoskeletal: No clubbing cyanosis or edema. Bandage on the left hip.  Data Reviewed: Basic Metabolic Panel:  Recent Labs Lab 11/30/13 0615 12/01/13 0440 12/02/13 0415  NA 134* 139 132*  K 3.5* 3.9 4.5  CL 97 102 97  CO2 21 23 23   GLUCOSE 101* 103* 137*  BUN 51* 38* 32*  CREATININE 1.63* 1.14 1.01  CALCIUM 7.8* 8.7 8.7   Liver Function Tests:  Recent Labs Lab 11/30/13 0615  AST 13  ALT 9  ALKPHOS 107  BILITOT 0.4  PROT 6.0  ALBUMIN 2.7*    Recent Labs Lab 11/30/13 0613  LIPASE 24   No results found for this basename: AMMONIA,  in the last 168 hours CBC:  Recent Labs Lab 11/30/13 0615 12/01/13 0440 12/02/13 0415  WBC 15.1* 9.0 10.3  HGB 11.6* 11.9* 11.6*  HCT 33.9* 35.6* 34.1*  MCV 87.1 87.0 87.7  PLT 257 282 274   Cardiac Enzymes: No results found for this basename: CKTOTAL, CKMB, CKMBINDEX, TROPONINI,  in the last 168 hours BNP (last 3 results)  Recent Labs  07/14/13 0330 08/31/13 1611 10/26/13 1331  PROBNP 3581.0* 66.0 67.0  CBG: No results found for this basename: GLUCAP,  in the last 168 hours  Recent Results (from the past 240 hour(s))  URINE CULTURE     Status: None   Collection Time    11/30/13  8:04 AM      Result Value Ref Range Status   Specimen Description URINE, CLEAN CATCH   Final   Special Requests NONE   Final   Culture  Setup Time     Final   Value: 11/30/2013 14:21      Performed at [place]     Final   Value: 5,000 COLONIES/ML     Performed at [facility]   Culture     Final   Value: INSIGNIFICANT GROWTH     Performed at [facility]   Report Status 12/01/2013 FINAL   Final  SURGICAL PCR SCREEN     Status: None   Collection Time    11/30/13  1:06 PM      Result Value Ref Range Status   MRSA, PCR NEGATIVE  NEGATIVE Final   Staphylococcus aureus NEGATIVE  NEGATIVE Final   Comment:            The Xpert SA Assay (FDA     approved for NASAL specimens     in patients over 45 years of age),     is one component of     a comprehensive surveillance     program.  Test performance has     been validated by [employer] for patients greater     than or equal to 60 year old.     It is not intended     to diagnose infection nor to     guide or monitor treatment.     Studies: No results found.  Scheduled Meds: . docusate sodium  100 mg Oral BID  . enoxaparin (LOVENOX) injection  40 mg Subcutaneous Q24H  . feeding supplement (ENSURE COMPLETE)  237 mL Oral QHS  . pantoprazole  80 mg Oral Q1200  . simvastatin  20 mg Oral Daily  . tamsulosin  0.4 mg Oral Daily   Continuous Infusions: . sodium chloride 0.9 % 1,000 mL infusion Stopped (12/01/13 0717)    Principal Problem:   Femoral neck fracture Active Problems:   HYPERTENSION   GERD   BARRETTS ESOPHAGUS   CKD (chronic kidney disease) stage 2, GFR 60-89 ml/min   Protein-calorie malnutrition, severe   A-fib   Acute on chronic renal failure   Chronic combined systolic and diastolic CHF (congestive heart failure)   Leukocytosis, unspecified    Time spent: 35 mins    [facility] MD [facility] Pager [phone]. If 7PM-7AM, please contact night-coverage at [email or web address], password [facility] 12/02/2013, 10:16 AM  LOS: 2 days

## 2013-12-02 NOTE — Progress Notes (Signed)
Physical Therapy Treatment Patient Details Name: [name] MRN: [id number] DOB: January 12, 1922 Today's Date: 12/02/2013    History of Present Illness Pt is a 80 year old male admitted after fall at home attempting to put on pants in standing and now s/p L hip hemiarthoplasty.     PT Comments    POD # 2 pm session.  Assisted pt out of recliner to [name] for attempted void.  Asssited from [name] to standing for attempted void.  Assisted to EOB for attempted void.  All unsuccessful.  Assisted back to bed and positioned to comfort.  NT in room during session to offer + 2 assist.  Follow Up Recommendations  SNF [name])     Equipment Recommendations       Recommendations for Other Services       Precautions / Restrictions Precautions Precautions: Fall;Posterior Hip Precaution Comments: educated on precautions, provided handout Restrictions Weight Bearing Restrictions: No Other Position/Activity Restrictions: WBAT    Mobility  Bed Mobility Overal bed mobility: Needs Assistance Bed Mobility: Supine to Sit     Supine to sit: Mod assist Sit to supine: +2 for physical assistance;Mod assist   General bed mobility comments: assisted back to bed  Transfers Overall transfer level: Needs assistance Equipment used: Rolling walker (2 wheeled) Transfers: Sit to/from Stand Sit to Stand: Mod assist;+2 physical assistance         General transfer comment: 75% VC's on proper tech, hand placement and to avoid hip flex > 90 degrees.  Very unsteady with mod c/o dizziness and some fear/anxiety.  Increased time to complete turns and poor ability to back step.  Ambulation/Gait Ambulation/Gait assistance: Mod assist;+2 physical assistance Ambulation Distance (Feet): 2 Feet Assistive device: Rolling walker (2 wheeled) Gait Pattern/deviations: Step-to pattern;Trunk flexed;Decreased stance time - left Gait velocity: decr   General Gait Details: a few steps from recliner to bed with 75% VC's on  turn completion and direction.  Very unsteady.  HIGH FALL RISK.   Stairs            Wheelchair Mobility    Modified Rankin (Stroke Patients Only)       Balance                                    Cognition                            Exercises      General Comments        Pertinent Vitals/Pain      Home Living                      Prior Function            PT Goals (current goals can now be found in the care plan section) Progress towards PT goals: Progressing toward goals    Frequency       PT Plan      Co-evaluation             End of Session Equipment Utilized During Treatment: Gait belt Activity Tolerance: Patient limited by fatigue Patient left: in bed;with call bell/phone within reach;with family/visitor present     Time: 1330-1356 PT Time Calculation (min): 26 min  Charges:  $Gait Training: 8-22 mins $Therapeutic Activity: 23-37 mins  G Codes:      Rica Koyanagi  PTA [facility]  Acute  Rehab Pager      [phone]

## 2013-12-03 DIAGNOSIS — E871 Hypo-osmolality and hyponatremia: Secondary | ICD-10-CM | POA: Diagnosis not present

## 2013-12-03 LAB — CBC
HEMATOCRIT: 33.1 % — AB (ref 39.0–52.0)
Hemoglobin: 11.4 g/dL — ABNORMAL LOW (ref 13.0–17.0)
MCH: 29.5 pg (ref 26.0–34.0)
MCHC: 34.4 g/dL (ref 30.0–36.0)
MCV: 85.8 fL (ref 78.0–100.0)
PLATELETS: 301 10*3/uL (ref 150–400)
RBC: 3.86 MIL/uL — ABNORMAL LOW (ref 4.22–5.81)
RDW: 12.6 % (ref 11.5–15.5)
WBC: 9.7 10*3/uL (ref 4.0–10.5)

## 2013-12-03 LAB — OSMOLALITY: Osmolality: 270 mOsm/kg — ABNORMAL LOW (ref 275–300)

## 2013-12-03 LAB — BASIC METABOLIC PANEL
ANION GAP: 10 (ref 5–15)
ANION GAP: 13 (ref 5–15)
BUN: 24 mg/dL — AB (ref 6–23)
BUN: 21 mg/dL (ref 6–23)
CALCIUM: 9 mg/dL (ref 8.4–10.5)
CHLORIDE: 90 meq/L — AB (ref 96–112)
CO2: 24 mEq/L (ref 19–32)
CO2: 24 mEq/L (ref 19–32)
CREATININE: 0.91 mg/dL (ref 0.50–1.35)
CREATININE: 0.89 mg/dL (ref 0.50–1.35)
Calcium: 8.9 mg/dL (ref 8.4–10.5)
Chloride: 92 mEq/L — ABNORMAL LOW (ref 96–112)
GFR calc Af Amer: 84 mL/min — ABNORMAL LOW (ref >90)
GFR calc Af Amer: 85 mL/min — ABNORMAL LOW (ref >90)
GFR calc non Af Amer: 72 mL/min — ABNORMAL LOW (ref >90)
GFR, EST NON AFRICAN AMERICAN: 73 mL/min — AB (ref >90)
Glucose, Bld: 116 mg/dL — ABNORMAL HIGH (ref 70–99)
Glucose, Bld: 113 mg/dL — ABNORMAL HIGH (ref 70–99)
Potassium: 4.3 mEq/L (ref 3.7–5.3)
Potassium: 4.5 mEq/L (ref 3.7–5.3)
Sodium: 124 mEq/L — ABNORMAL LOW (ref 137–147)
Sodium: 129 mEq/L — ABNORMAL LOW (ref 137–147)

## 2013-12-03 LAB — CORTISOL: Cortisol, Plasma: 18.9 ug/dL

## 2013-12-03 LAB — CREATININE, URINE, RANDOM: CREATININE, URINE: 85.3 mg/dL

## 2013-12-03 LAB — OSMOLALITY, URINE: OSMOLALITY UR: 364 mosm/kg — AB (ref 390–1090)

## 2013-12-03 LAB — TSH: TSH: 2 u[IU]/mL (ref 0.350–4.500)

## 2013-12-03 LAB — SODIUM, URINE, RANDOM

## 2013-12-03 MED ORDER — SODIUM CHLORIDE 0.9 % IV SOLN
INTRAVENOUS | Status: DC
  Filled 2013-12-03: qty 1000

## 2013-12-03 MED ORDER — SODIUM CHLORIDE 0.9 % IV SOLN
INTRAVENOUS | Status: DC
  Administered 2013-12-03: 08:00:00 via INTRAVENOUS

## 2013-12-03 NOTE — Progress Notes (Signed)
Physical Therapy Treatment Patient Details Name: [name] MRN: [id number] DOB: 1921-08-30 Today's Date: 12/03/2013    History of Present Illness Pt is a 80 year old male admitted after fall at home attempting to put on pants in standing and now s/p L hip hemiarthoplasty.     PT Comments    Pt able to tolerate short distance ambulation into hallway today with recliner following.  Pt also performed a couple LE exercises.  Follow Up Recommendations  SNF     Equipment Recommendations  None recommended by PT    Recommendations for Other Services       Precautions / Restrictions Precautions Precautions: Fall;Posterior Hip Precaution Comments: pt only able to recall 2/3 precautions Restrictions Other Position/Activity Restrictions: WBAT    Mobility  Bed Mobility               General bed mobility comments: pt up in recliner on arrival  Transfers Overall transfer level: Needs assistance Equipment used: Rolling walker (2 wheeled) Transfers: Sit to/from Stand Sit to Stand: Mod assist;+2 physical assistance         General transfer comment: verbal cues for safe technique within precautions, increased time to perform  Ambulation/Gait Ambulation/Gait assistance: Min assist;+2 physical assistance Ambulation Distance (Feet): 17 Feet Assistive device: Rolling walker (2 wheeled) Gait Pattern/deviations: Step-to pattern;Antalgic;Decreased stance time - left Gait velocity: decr   General Gait Details: pt performed 8 feet then required seated rest break and then able to perform another 9 feet, increased time and verbal cues for sequence, step length, RW positioning   Stairs            Wheelchair Mobility    Modified Rankin (Stroke Patients Only)       Balance                                    Cognition Arousal/Alertness: Awake/alert Behavior During Therapy: WFL for tasks assessed/performed Overall Cognitive Status: Within Functional  Limits for tasks assessed                      Exercises General Exercises - Lower Extremity Ankle Circles/Pumps: AROM;Both;10 reps Quad Sets: 10 reps;Both;AROM Gluteal Sets: AROM;Both;10 reps Short Arc Quad: AROM;10 reps;Left Hip ABduction/ADduction: AAROM;Left;10 reps    General Comments        Pertinent Vitals/Pain Pain Assessment: 0-10 Pain Score: 5  Pain Location: L hip Pain Descriptors / Indicators: Sore Pain Intervention(s): Limited activity within patient's tolerance;Monitored during session;Repositioned    Home Living                      Prior Function            PT Goals (current goals can now be found in the care plan section) Progress towards PT goals: Progressing toward goals    Frequency  Min 3X/week    PT Plan Current plan remains appropriate    Co-evaluation             End of Session Equipment Utilized During Treatment: Gait belt Activity Tolerance: Patient tolerated treatment well Patient left: with call bell/phone within reach;with family/visitor present;in chair     Time: 3734-2876 PT Time Calculation (min): 23 min  Charges:  $Gait Training: 23-37 mins                    G Codes:  [name],[name] 12/03/2013, 12:15 PM [name], PT, DPT 12/03/2013 Pager: [phone]

## 2013-12-03 NOTE — Anesthesia Postprocedure Evaluation (Signed)
Anesthesia Post Note  Patient: [name]  Procedure(s) Performed: Procedure(s) (LRB): ARTHROPLASTY BIPOLAR HIP (Left)  Anesthesia type: Spinal  Patient location: PACU  Post pain: Pain level controlled  Post assessment: Post-op Vital signs reviewed  Last Vitals: BP 117/64  Pulse 83  Temp(Src) 36.4 C (Oral)  Resp 17  Ht 5\' 5"  (1.651 m)  Wt 157 lb (71.215 kg)  BMI 26.13 kg/m2  SpO2 97%  Post vital signs: Reviewed  Level of consciousness: sedated  Complications: No apparent anesthesia complications

## 2013-12-03 NOTE — Progress Notes (Signed)
   Subjective: 3 Days Post-Op Procedure(s) (LRB): ARTHROPLASTY BIPOLAR HIP (Left) Patient reports pain as mild.   Patient seen in rounds for Dr. Wynelle Link. Patient is well, and has had no acute complaints or problems but his affect seems more flat today as compared to the past two days. Plan is to go Skilled nursing facility after hospital stay.  Objective: Vital signs in last 24 hours: Temp:  [97.4 F (36.3 C)-98.3 F (36.8 C)] 98.3 F (36.8 C) (09/10 0540) Pulse Rate:  [71-76] 76 (09/10 0540) Resp:  [16-18] 18 (09/10 0800) BP: (115-128)/(58-64) 115/58 mmHg (09/10 0540) SpO2:  [93 %-98 %] 98 % (09/10 0540)  Intake/Output from previous day:  Intake/Output Summary (Last 24 hours) at 12/03/13 0912 Last data filed at 12/03/13 0540  Gross per 24 hour  Intake    900 ml  Output    950 ml  Net    -50 ml    Intake/Output this shift:    Labs:  Recent Labs  12/01/13 0440 12/02/13 0415 12/03/13 0410  HGB 11.9* 11.6* 11.4*    Recent Labs  12/02/13 0415 12/03/13 0410  WBC 10.3 9.7  RBC 3.89* 3.86*  HCT 34.1* 33.1*  PLT 274 301    Recent Labs  12/02/13 0415 12/03/13 0410  NA 132* 124*  K 4.5 4.3  CL 97 90*  CO2 23 24  BUN 32* 24*  CREATININE 1.01 0.91  GLUCOSE 137* 116*  CALCIUM 8.7 9.0   No results found for this basename: LABPT, INR,  in the last 72 hours  EXAM General - Patient is Alert Extremity - Neurovascular intact Sensation intact distally Dressing/Incision - clean, dry, no drainage Motor Function - intact, moving foot and toes well on exam.   Past Medical History  Diagnosis Date  . BARRETTS ESOPHAGUS 11/19/2007  . CARDIOMYOPATHY 08/02/2008  . CHANGE IN BOWELS 02/01/2009  . CHF 08/10/2008  . Diarrhea 01/31/2009  . DIVERTICULOSIS, COLON 11/19/2007  . GASTRITIS 11/19/2007  . GERD 11/19/2007  . HIATAL HERNIA 11/19/2007  . HYPERLIPIDEMIA 06/27/2009  . HYPOTHYROIDISM 06/27/2009  . MELENA 08/01/2009  . PERSONAL HX COLONIC POLYPS 02/01/2009  . TINEA CORPORIS  06/27/2009  . TOBACCO USE, QUIT 06/27/2009  . TREMOR, ESSENTIAL 06/27/2009  . HTN (hypertension)   . IBS (irritable bowel syndrome)   . Heart murmur   . Blood transfusion without reported diagnosis     Assessment/Plan: 3 Days Post-Op Procedure(s) (LRB): ARTHROPLASTY BIPOLAR HIP (Left) Principal Problem:   Femoral neck fracture Active Problems:   HYPERTENSION   GERD   BARRETTS ESOPHAGUS   CKD (chronic kidney disease) stage 2, GFR 60-89 ml/min   Protein-calorie malnutrition, severe   A-fib   Acute on chronic renal failure   Chronic combined systolic and diastolic CHF (congestive heart failure)   Leukocytosis, unspecified   Hyponatremia  Estimated body mass index is 26.13 kg/(m^2) as calculated from the following:   Height as of this encounter: 5\' 5"  (1.651 m).   Weight as of this encounter: 71.215 kg (157 lb). Up with therapy Discharge to SNF  DVT Prophylaxis - Lovenox for ten days total and then followed by Aspirin Weight Bearing As Tolerated left Leg Hip Preacutions  F/U with Dr. Wynelle Link in two weeks RX on chart in wall  Arlee Muslim, PA-C Orthopaedic Surgery 12/03/2013, 9:12 AM

## 2013-12-03 NOTE — Progress Notes (Signed)
Utilization review completed.  

## 2013-12-03 NOTE — Progress Notes (Signed)
[facility] PROGRESS NOTE  [name] LNL:[id number] DOB: [birth year] DOA: 11/30/2013 PCP: Kennon Portela, MD  Assessment/Plan: #1 displaced left femoral neck fracture Secondary to mechanical fall. Status post left hip hemiarthroplasty. Weight bearing as tolerated. Per orthopedics.  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 Baseline creatinine 1-1.3. Improved with hydration. ACE inhibitor and diuretic on hold. Follow.  #3 chronic combined systolic and diastolic CHF 2-D echo from 07/13/2013 with EF of 45% with grade 1 diastolic dysfunction. Currently euvolemic. Diuretics on hold. Resume toprol. Follow.  #4 hypertension Blood pressure was borderline and a such antihypertensives on hold. Follow.  #5 history of paroxysmal atrial fibrillation Currently in normal sinus rhythm. Due to high fall risk patient was deemed not anticoagulation candidate and supposed to be on aspirin daily. Resume toprol. Patient currently on Lovenox per orthopedics for DVT prophylaxis and that will be on aspirin.  #6 Barretts esophagus Stable. Continue PPI.  #7 hyponatremia Likely secondary to volume depletion. Initially improved with hydration but now back down. Will place on IVF. Check urine osmolality and serum osmolality and FeNa. Repeat BMET this afternoon.  #8 leukocytosis Likely reactive leukocytosis secondary to problem #1. Patient currently afebrile. Urinalysis was negative. Patient with no respiratory symptoms. WBC trending down. Follow.  #9 prophylaxis PPI for GI prophylaxis. Lovenox for DVT prophylaxis for 10 days followed by aspirin per orthopedics.  Code Status: Full Family Communication: Updated patient and significant other at bedside.updated son by telephone. Disposition Plan: SNF when medically stable hopefully tomorrow.   Consultants:  Orthopedics: Dr  Wynelle Link 11/30/2013  Procedures:  CT head without contrast 11/30/2013  Left hip hemiarthroplasty per Dr. Wynelle Link 11/30/2013  Antibiotics:  None  HPI/Subjective: Patient without complaints.  Patient states pain is managed.  Objective: Filed Vitals:   12/03/13 0540  BP: 115/58  Pulse: 76  Temp: 98.3 F (36.8 C)  Resp: 17    Intake/Output Summary (Last 24 hours) at 12/03/13 0742 Last data filed at 12/03/13 0540  Gross per 24 hour  Intake   1260 ml  Output    950 ml  Net    310 ml   Filed Weights   11/30/13 1232  Weight: 71.215 kg (157 lb)    Exam:   General:  NAD  Cardiovascular: RRR  Respiratory: CTAB  Abdomen: Soft, nontender, nondistended, positive bowel sounds.  Musculoskeletal: No clubbing cyanosis or edema. Bandage on the left hip.  Data Reviewed: Basic Metabolic Panel:  Recent Labs Lab 11/30/13 0615 12/01/13 0440 12/02/13 0415 12/03/13 0410  NA 134* 139 132* 124*  K 3.5* 3.9 4.5 4.3  CL 97 102 97 90*  CO2 21 23 23 24   GLUCOSE 101* 103* 137* 116*  BUN 51* 38* 32* 24*  CREATININE 1.63* 1.14 1.01 0.91  CALCIUM 7.8* 8.7 8.7 9.0   Liver Function Tests:  Recent Labs Lab 11/30/13 0615  AST 13  ALT 9  ALKPHOS 107  BILITOT 0.4  PROT 6.0  ALBUMIN 2.7*    Recent Labs Lab 11/30/13 0613  LIPASE 24   No results found for this basename: AMMONIA,  in the last 168 hours CBC:  Recent Labs Lab 11/30/13 0615 12/01/13 0440 12/02/13 0415 12/03/13 0410  WBC 15.1* 9.0 10.3 9.7  HGB 11.6* 11.9* 11.6* 11.4*  HCT 33.9* 35.6* 34.1* 33.1*  MCV 87.1 87.0 87.7 85.8  PLT 257 282 274 301   Cardiac Enzymes:  No results found for this basename: CKTOTAL, CKMB, CKMBINDEX, TROPONINI,  in the last 168 hours BNP (last 3 results)  Recent Labs  07/14/13 0330 08/31/13 1611 10/26/13 1331  PROBNP 3581.0* 66.0 67.0   CBG: No results found for this basename: GLUCAP,  in the last 168 hours  Recent Results (from the past 240 hour(s))  URINE CULTURE      Status: None   Collection Time    11/30/13  8:04 AM      Result Value Ref Range Status   Specimen Description URINE, CLEAN CATCH   Final   Special Requests NONE   Final   Culture  Setup Time     Final   Value: 11/30/2013 14:21     Performed at [place]     Final   Value: 5,000 COLONIES/ML     Performed at [facility]   Culture     Final   Value: INSIGNIFICANT GROWTH     Performed at [facility]   Report Status 12/01/2013 FINAL   Final  SURGICAL PCR SCREEN     Status: None   Collection Time    11/30/13  1:06 PM      Result Value Ref Range Status   MRSA, PCR NEGATIVE  NEGATIVE Final   Staphylococcus aureus NEGATIVE  NEGATIVE Final   Comment:            The Xpert SA Assay (FDA     approved for NASAL specimens     in patients over 22 years of age),     is one component of     a comprehensive surveillance     program.  Test performance has     been validated by [employer] for patients greater     than or equal to 58 year old.     It is not intended     to diagnose infection nor to     guide or monitor treatment.     Studies: No results found.  Scheduled Meds: . docusate sodium  100 mg Oral BID  . enoxaparin (LOVENOX) injection  40 mg Subcutaneous Q24H  . feeding supplement (ENSURE COMPLETE)  237 mL Oral QHS  . pantoprazole  80 mg Oral Q1200  . simvastatin  20 mg Oral Daily  . tamsulosin  0.4 mg Oral Daily   Continuous Infusions: . sodium chloride 0.9 % 1,000 mL infusion Stopped (12/01/13 0717)    Principal Problem:   Femoral neck fracture Active Problems:   HYPERTENSION   GERD   BARRETTS ESOPHAGUS   CKD (chronic kidney disease) stage 2, GFR 60-89 ml/min   Protein-calorie malnutrition, severe   A-fib   Acute on chronic renal failure   Chronic combined systolic and diastolic CHF (congestive heart failure)   Leukocytosis, unspecified   Hyponatremia    Time spent: 35 mins    [facility] MD [facility] Pager [phone]. If 7PM-7AM, please contact night-coverage at [email or web address], password [facility] 12/03/2013, 7:42 AM  LOS: 3 days

## 2013-12-03 NOTE — Progress Notes (Signed)
CSW assisting with d/c planning. [place] will have a ST Rehab bed on Friday if pt is stable for d/c.  Werner Lean LCSW [phone]

## 2013-12-03 NOTE — Progress Notes (Signed)
Came to visit patient to offer [place] [facility] services for when he goes home from [facility].Consents obtained. Confirmed best contact information. [facility] Management to follow up with after discharge from [facility]. Made inpatient RNCM aware.  Marthenia Rolling, MSN-RN,BSN- [facility] Liaison305 [phone]

## 2013-12-04 LAB — BASIC METABOLIC PANEL
ANION GAP: 12 (ref 5–15)
BUN: 19 mg/dL (ref 6–23)
CALCIUM: 8.6 mg/dL (ref 8.4–10.5)
CO2: 24 mEq/L (ref 19–32)
Chloride: 97 mEq/L (ref 96–112)
Creatinine, Ser: 0.89 mg/dL (ref 0.50–1.35)
GFR, EST AFRICAN AMERICAN: 85 mL/min — AB (ref >90)
GFR, EST NON AFRICAN AMERICAN: 73 mL/min — AB (ref >90)
Glucose, Bld: 116 mg/dL — ABNORMAL HIGH (ref 70–99)
Potassium: 4.3 mEq/L (ref 3.7–5.3)
Sodium: 133 mEq/L — ABNORMAL LOW (ref 137–147)

## 2013-12-04 LAB — CBC
HCT: 32.9 % — ABNORMAL LOW (ref 39.0–52.0)
Hemoglobin: 11 g/dL — ABNORMAL LOW (ref 13.0–17.0)
MCH: 29.3 pg (ref 26.0–34.0)
MCHC: 33.4 g/dL (ref 30.0–36.0)
MCV: 87.7 fL (ref 78.0–100.0)
Platelets: 300 10*3/uL (ref 150–400)
RBC: 3.75 MIL/uL — ABNORMAL LOW (ref 4.22–5.81)
RDW: 12.6 % (ref 11.5–15.5)
WBC: 8.5 10*3/uL (ref 4.0–10.5)

## 2013-12-04 MED ORDER — FUROSEMIDE 20 MG PO TABS: 20.0000 mg | ORAL_TABLET | Freq: Every day | ORAL | Status: DC

## 2013-12-04 MED ORDER — METOPROLOL SUCCINATE ER 25 MG PO TB24: 25.0000 mg | ORAL_TABLET | Freq: Every day | ORAL | Status: DC

## 2013-12-04 MED ORDER — DSS 100 MG PO CAPS: 100.0000 mg | ORAL_CAPSULE | Freq: Two times a day (BID) | ORAL | Status: DC

## 2013-12-04 NOTE — Discharge Summary (Signed)
Physician Discharge Summary  [name]:[id number] DOB: 1921/07/02 DOA: 11/30/2013  PCP: Kennon Portela, MD  Admit date: 11/30/2013 Discharge date: 12/04/2013  Time spent: 65 minutes  Recommendations for Outpatient Follow-up:  1. Patient will be discharged to [facility]. Followup with Dr. Wynelle Link in 2 weeks. 2. Follow up with M.D. At the skilled nursing facility.  Patient will need a basic metabolic profile done to follow up on electrolytes and renal function in 1 week. Patient's blood pressure also needs to be reassessed at that time.  Discharge Diagnoses:  Principal Problem:   Femoral neck fracture Active Problems:   HYPERTENSION   GERD   BARRETTS ESOPHAGUS   CKD (chronic kidney disease) stage 2, GFR 60-89 ml/min   Protein-calorie malnutrition, severe   A-fib   Acute on chronic renal failure   Chronic combined systolic and diastolic CHF (congestive heart failure)   Leukocytosis, unspecified   Hyponatremia   Discharge Condition: stable and improved  Diet recommendation: heart healthy  Filed Weights   11/30/13 1232  Weight: 71.215 kg (157 lb)    History of present illness:  80 year old male with a history of paroxysmal atrial fibrillation, Barrett's esophagus, systolic and diastolic CHF, diverticulosis, hyperlipidemia, hypertension presents with left ear pain with associated nausea and vomiting. The patient had a mechanical fall on 11/01/2013 without any syncope. He went to urgent care. Left hip x-rays on 11/02/2013 were negative for any fractures. The patient continued to have pain in his left hip that progressively worsened to the point that he was not able to bear weight. He went back to urgent care or MRI of the left hip was performed and revealed a displaced left femoral neck fracture. Apparently, the patient was called about the results on the day prior to this admission. He states that he was actually going to go to urgent care on 12/01/2013. However, in the  early morning of 11/30/2013 around 2 AM the patient began having nausea, vomiting, and diarrhea. As a result, EMS was activated, and the patient was brought to the hospital. There was no hematemesis or hematochezia. The patient denies any fevers, chills, chest pain or shortness of breath, dizziness, syncope, abdominal pain, dysuria, hematuria. He states that he normally has 1-2 loose bowel movements everyday due to his irritable bowel syndrome. During the evening prior to this admission, the patient took 3 pills of Norco and 3 pills of what appears to be ibuprofen per his history due to uncontrolled pain.  In the emergency department, labs revealed serum creatinine 1.63, WBC 15.1, sodium 134, bicarbonate 21, lactic acid 0.65. EKG was sinus rhythm without ST-T wave changes. The patient had soft blood pressures in the emergency department particularly after being given fentanyl IV.   Hospital Course:  #1 displaced left femoral neck fracture  Patient was admitted after a mechanical fall and noted to have a displaced left femoral neck fracture. Orthopedics was consulted and patient was seen in consultation by Dr.Aluisio. Patient subsequently underwent left hip hemiarthroplasty, without any complications. Patient was followed out the peaks throughout the hospitalization. Patient was weightbearing as tolerated and seen by physical therapy during patient will be discharged to a skilled nursing facility and will follow up with orthopedics as outpatient 2 weeks post discharge. #2 acute on chronic kidney disease (CKD 2-3)  Likely secondary to a prerenal azotemia secondary to volume depletion, in the setting of diuretics and ACE inhibitor. Baseline creatinine 1-1.3. Improved with hydration. ACE inhibitor and diuretic were held during the hospitalization.  Patient's renal function improved and was at baseline by day of discharge. Patient's diuretics will be resumed on discharge and this will need to be followed up upon  as outpatient. #3 chronic combined systolic and diastolic CHF  2-D echo from 07/13/2013 with EF of 45% with grade 1 diastolic dysfunction. Patient range from dehydration to euvolemic during the hospitalization. Patient's diuretics were held. Beta blocker was also held. Beta blocker and diuretics will be resumed on discharge. Outpatient follow up.  #4 hypertension  Blood pressure was borderline and as such antihypertensives were held throughout the hospitalization. Patient's blood pressure remained stable. Patient's lisinopril HCTZ has been discontinued. Patient's Toprol and Lasix will be resumed on discharge. Patient will need to follow up with M.D. at the skilled nursing facility. #5 history of paroxysmal atrial fibrillation  Remained in normal sinus rhythm. Due to high fall risk patient was deemed not anticoagulation candidate and supposed to be on aspirin daily. Patient's Toprol was held during the hospitalization due to borderline blood pressure will be resumed on discharge. Patient placed on on Lovenox per orthopedics for DVT prophylaxis and then will be on aspirin.  #6 Barretts esophagus  Stable. Patient was maintained on a PPI throughout the hospitalization. #7 hyponatremia  Likely secondary to volume depletion. Initially improved with hydration but now back down. Urine sodium which was obtained was less than 15, consistent with a hypovolemic hyponatremia. Patient's diuretics were held throughout the hospitalization and will be resumed on discharge. Patient was hydrated with IV fluids with improvement with his hyponatremia such that by day of discharge his sodium levels up to 133. Outpatient followup #8 leukocytosis  Likely reactive leukocytosis secondary to problem #1. Patient remained afebrile throughout the hospitalization. Urinalysis was negative. Patient with no respiratory symptoms. WBC trended down and leukocytosis had resolved by day of discharge.      Procedures: CT head without  contrast 11/30/2013  Left hip hemiarthroplasty per Dr. Wynelle Link 11/30/2013   Consultations: Orthopedics: Dr Wynelle Link 11/30/2013     Discharge Exam: Filed Vitals:   12/04/13 0533  BP: 107/53  Pulse: 70  Temp: 98.2 F (36.8 C)  Resp: 16    General: NAD Cardiovascular: RRR Respiratory: CTAB  Discharge Instructions You were cared for by a hospitalist during your hospital stay. If you have any questions about your discharge medications or the care you received while you were in the hospital after you are discharged, you can call the unit and asked to speak with the hospitalist on call if the hospitalist that took care of you is not available. Once you are discharged, your primary care physician will handle any further medical issues. Please note that NO REFILLS for any discharge medications will be authorized once you are discharged, as it is imperative that you return to your primary care physician (or establish a relationship with a primary care physician if you do not have one) for your aftercare needs so that they can reassess your need for medications and monitor your lab values.  Discharge Instructions   Diet - low sodium heart healthy    Complete by:  As directed      Discharge instructions    Complete by:  As directed   Follow up with MD at SNF. Follow up with Dr Wynelle Link in 2 weeks.          Current Discharge Medication List    START taking these medications   Details  docusate sodium 100 MG CAPS Take 100 mg by mouth 2 (two)  times daily. Qty: 10 capsule, Refills: 0    enoxaparin (LOVENOX) 40 MG/0.4ML injection Inject 0.4 mLs (40 mg total) into the skin daily. Take injections for seven more days and then switch to a baby 81 mg Aspirin daily for three more weeks. Qty: 7 Syringe, Refills: 0    methocarbamol (ROBAXIN) 500 MG tablet Take 1 tablet (500 mg total) by mouth every 6 (six) hours as needed for muscle spasms. Qty: 80 tablet, Refills: 0    oxyCODONE (OXY  IR/ROXICODONE) 5 MG immediate release tablet Take 1-2 tablets (5-10 mg total) by mouth every 4 (four) hours as needed for moderate pain, severe pain or breakthrough pain ((for MODERATE breakthrough pain)). Qty: 80 tablet, Refills: 0    traMADol (ULTRAM) 50 MG tablet Take 1-2 tablets (50-100 mg total) by mouth every 6 (six) hours as needed (mild pain). Qty: 60 tablet, Refills: 1      CONTINUE these medications which have CHANGED   Details  furosemide (LASIX) 20 MG tablet Take 1-2 tablets (20-40 mg total) by mouth daily. Take 20mg  one day then alternate with 40mg  the next day Qty: 30 tablet, Refills: 0    metoprolol succinate (TOPROL-XL) 25 MG 24 hr tablet Take 1 tablet (25 mg total) by mouth daily with breakfast. Qty: 30 tablet, Refills: 0      CONTINUE these medications which have NOT CHANGED   Details  esomeprazole (NEXIUM) 40 MG capsule Take 1 capsule (40 mg total) by mouth daily at 12 noon. Qty: 30 capsule, Refills: 6    feeding supplement, ENSURE COMPLETE, (ENSURE COMPLETE) LIQD Take 237 mLs by mouth at bedtime. Qty: 30 Bottle, Refills: 1    Multiple Vitamins-Minerals (PRESERVISION AREDS 2 PO) Take 1 tablet by mouth 2 (two) times daily. 1 TAB TWICE A DAY    OVER THE COUNTER MEDICATION Take 1 tablet by mouth daily. Over the counter probiotic (not align)    simvastatin (ZOCOR) 20 MG tablet Take 1 tablet (20 mg total) by mouth daily. Qty: 90 tablet, Refills: 3    Tamsulosin HCl (FLOMAX) 0.4 MG CAPS Take 0.4 mg by mouth daily.     triamcinolone cream (KENALOG) 0.1 % Apply 1 application topically 3 (three) times daily. Qty: 15 g, Refills: 0      STOP taking these medications     HYDROcodone-acetaminophen (NORCO/VICODIN) 5-325 MG per tablet      lisinopril-hydrochlorothiazide (PRINZIDE,ZESTORETIC) 10-12.5 MG per tablet        Allergies  Allergen Reactions  . Ciprofloxacin     Questionable allergy as pt reports taking med without difficulty for resp infection  .  Penicillins Rash   Follow-up Information   Follow up with [name], MD. Schedule an appointment as soon as possible for a visit in 2 weeks. (Call office at [phone] for follow up apointment for patient with Dr. Wynelle Link.)    Specialty:  Orthopedic Surgery   Contact information:   [address] [place] [address] [phone]       Please follow up. (f/u with MD at [facility])        The results of significant diagnostics from this hospitalization (including imaging, microbiology, ancillary and laboratory) are listed below for reference.    Significant Diagnostic Studies: Ct Head Wo Contrast  11/30/2013   CLINICAL DATA:  80 year old male. Headache. Generalized weakness. Nausea and vomiting with diarrhea.  EXAM: CT HEAD WITHOUT CONTRAST  TECHNIQUE: Contiguous axial images were obtained from the base of the skull through the vertex without  intravenous contrast.  COMPARISON:  06/27/2010 sinus CT.  No comparison head CT.  FINDINGS: No skull fracture or intracranial hemorrhage.  Moderate small vessel disease type changes with findings suggestive of remote small left parietal lobe infarct.  The internal carotid arteries and basilar artery appear slightly dense, not significantly dissimilar from the prior exam as may indicate the presence of thrombus. Overall, no CT evidence of large acute infarct.  Mild global age-appropriate atrophy without hydrocephalus.  No intracranial mass lesion noted on this unenhanced exam.  Persistent polypoid opacification right sphenoid sinus with minimal polypoid opacification left sphenoid sinus and inferior aspect of the right maxillary sinus.  IMPRESSION: Moderate small vessel disease type changes without CT evidence of large acute infarct as discussed above.  No intracranial hemorrhage.  No intracranial mass lesion noted on this unenhanced exam.  Age-appropriate atrophy without hydrocephalus.  Paranasal sinus opacification as noted above.    Electronically Signed   By: Chauncey Cruel M.D.   On: 11/30/2013 07:59   Mr Hip Left W Wo Contrast  11/26/2013   CLINICAL DATA:  Left leg pain. The patient cannot walk. Fall, initial encounter. Left hip fracture.  EXAM: MRI OF THE LEFT HIP WITHOUT AND WITH CONTRAST  TECHNIQUE: Multiplanar, multisequence MR imaging was performed both before and after administration of intravenous contrast.  CONTRAST:  48mL MULTIHANCE GADOBENATE DIMEGLUMINE 529 MG/ML IV SOLN  COMPARISON:  11/02/2013  FINDINGS: Bones: Acute completed left femoral neck fracture, varus angulation, with considerable marrow edema along the fracture plane. Mild overlap and lateral displacement of the proximal femur with respect to the femoral head. As expected there is considerable surrounding soft tissue edema. Small left hip effusion. Edema and surrounding musculature and tracking in the left hip adductor musculature.  No sciatic nerve impingement. Low-level edema in the distal iliacus muscle of the iliopsoas appears intact distally.  Proximal hamstring tendons intact. No other fracture in the region. Lower lumbar spondylosis.  IMPRESSION: 1. Acute moderately displaced left femoral neck fracture with varus angulation. Considerable surrounding marrow and soft tissue edema. 2. Lower lumbar spondylosis.   Electronically Signed   By: Sherryl Barters M.D.   On: 11/26/2013 12:13    Microbiology: Recent Results (from the past 240 hour(s))  URINE CULTURE     Status: None   Collection Time    11/30/13  8:04 AM      Result Value Ref Range Status   Specimen Description URINE, CLEAN CATCH   Final   Special Requests NONE   Final   Culture  Setup Time     Final   Value: 11/30/2013 14:21     Performed at [facility] Count     Final   Value: 5,000 COLONIES/ML     Performed at [facility]   Culture     Final   Value: INSIGNIFICANT GROWTH     Performed at [facility]   Report Status 12/01/2013 FINAL   Final   SURGICAL PCR SCREEN     Status: None   Collection Time    11/30/13  1:06 PM      Result Value Ref Range Status   MRSA, PCR NEGATIVE  NEGATIVE Final   Staphylococcus aureus NEGATIVE  NEGATIVE Final   Comment:            The Xpert SA Assay (FDA     approved for NASAL specimens     in patients over 72 years of age),  is one component of     a comprehensive surveillance     program.  Test performance has     been validated by [facility] for patients greater     than or equal to 52 year old.     It is not intended     to diagnose infection nor to     guide or monitor treatment.     Labs: Basic Metabolic Panel:  Recent Labs Lab 12/01/13 0440 12/02/13 0415 12/03/13 0410 12/03/13 1444 12/04/13 0433  NA 139 132* 124* 129* 133*  K 3.9 4.5 4.3 4.5 4.3  CL 102 97 90* 92* 97  CO2 23 23 24 24 24   GLUCOSE 103* 137* 116* 113* 116*  BUN 38* 32* 24* 21 19  CREATININE 1.14 1.01 0.91 0.89 0.89  CALCIUM 8.7 8.7 9.0 8.9 8.6   Liver Function Tests:  Recent Labs Lab 11/30/13 0615  AST 13  ALT 9  ALKPHOS 107  BILITOT 0.4  PROT 6.0  ALBUMIN 2.7*    Recent Labs Lab 11/30/13 0613  LIPASE 24   No results found for this basename: AMMONIA,  in the last 168 hours CBC:  Recent Labs Lab 11/30/13 0615 12/01/13 0440 12/02/13 0415 12/03/13 0410 12/04/13 0433  WBC 15.1* 9.0 10.3 9.7 8.5  HGB 11.6* 11.9* 11.6* 11.4* 11.0*  HCT 33.9* 35.6* 34.1* 33.1* 32.9*  MCV 87.1 87.0 87.7 85.8 87.7  PLT 257 282 274 301 300   Cardiac Enzymes: No results found for this basename: CKTOTAL, CKMB, CKMBINDEX, TROPONINI,  in the last 168 hours BNP: BNP (last 3 results)  Recent Labs  07/14/13 0330 08/31/13 1611 10/26/13 1331  PROBNP 3581.0* 66.0 67.0   CBG: No results found for this basename: GLUCAP,  in the last 168 hours     Signed:  [facility] MD [facility] 12/04/2013, 10:26 AM

## 2013-12-04 NOTE — Progress Notes (Signed)
Clinical Social Work Department CLINICAL SOCIAL WORK PLACEMENT NOTE 12/04/2013  Patient:  [name], [name]  Account Number:  [id number] Admit date:  11/30/2013  Clinical Social Worker:  Werner Lean, LCSW  Date/time:  11/30/2013 03:13 PM  Clinical Social Work is seeking post-discharge placement for this patient at the following level of care:   SKILLED NURSING   (*CSW will update this form in Epic as items are completed)   11/30/2013  Patient/family provided with [place] Department of Clinical Social Work's list of facilities offering this level of care within the geographic area requested by the patient (or if unable, by the patient's family).  11/30/2013  Patient/family informed of their freedom to choose among providers that offer the needed level of care, that participate in Medicare, Medicaid or managed care program needed by the patient, have an available bed and are willing to accept the patient.    Patient/family informed of [facility]' ownership interest in [facility], as well as of the fact that they are under no obligation to receive care at this facility.  PASARR submitted to EDS on 11/30/2013 PASARR number received on 11/30/2013  FL2 transmitted to all facilities in geographic area requested by pt/family on  11/30/2013 FL2 transmitted to all facilities within larger geographic area on   Patient informed that his/her managed care company has contracts with or will negotiate with  certain facilities, including the following:     Patient/family informed of bed offers received:  12/01/2013 Patient chooses bed at [facility] Physician recommends and patient chooses bed at    Patient to be transferred to [place] [facility] on  12/03/2013 Patient to be transferred to facility by [facility] Patient and family notified of transfer on 12/04/2013 Name of family member notified:  SON  The following physician request were entered in Epic:   Additional Comments: Pt /  son are in agreemnet with d/c to SNF today. [facility] transport required.Nsg reviewed d/c summary, scripts,avs. Scripts included in d/c packet.  Werner Lean LCSW [phone]

## 2013-12-04 NOTE — Progress Notes (Signed)
   Subjective: 4 Days Post-Op Procedure(s) (LRB): ARTHROPLASTY BIPOLAR HIP (Left) Patient reports pain as mild.   Patient seen in rounds with Dr. Wynelle Link.  He is more alert and appears to be more at his baseline.  Improved affect since yesterday. Patient is well, but has had some minor complaints of pain in the hip, requiring pain medications Patient is ready to go to the SNF  from an orthopedic standpoint.  Objective: Vital signs in last 24 hours: Temp:  [97.6 F (36.4 C)-98.5 F (36.9 C)] 98.2 F (36.8 C) (09/11 0533) Pulse Rate:  [70-83] 70 (09/11 0533) Resp:  [16-17] 16 (09/11 0533) BP: (107-117)/(53-64) 107/53 mmHg (09/11 0533) SpO2:  [94 %-99 %] 99 % (09/11 0533)  Intake/Output from previous day:  Intake/Output Summary (Last 24 hours) at 12/04/13 0833 Last data filed at 12/04/13 0535  Gross per 24 hour  Intake 1685.75 ml  Output    700 ml  Net 985.75 ml    Intake/Output this shift:    Labs:  Recent Labs  12/02/13 0415 12/03/13 0410 12/04/13 0433  HGB 11.6* 11.4* 11.0*    Recent Labs  12/03/13 0410 12/04/13 0433  WBC 9.7 8.5  RBC 3.86* 3.75*  HCT 33.1* 32.9*  PLT 301 300    Recent Labs  12/03/13 1444 12/04/13 0433  NA 129* 133*  K 4.5 4.3  CL 92* 97  CO2 24 24  BUN 21 19  CREATININE 0.89 0.89  GLUCOSE 113* 116*  CALCIUM 8.9 8.6   No results found for this basename: LABPT, INR,  in the last 72 hours  EXAM: General - Patient is Alert Extremity - Neurovascular intact Sensation intact distally Dorsiflexion/Plantar flexion intact Incision - clean, dry, no drainage Motor Function - intact, moving foot and toes well on exam.   Assessment/Plan: 4 Days Post-Op Procedure(s) (LRB): ARTHROPLASTY BIPOLAR HIP (Left) Procedure(s) (LRB): ARTHROPLASTY BIPOLAR HIP (Left) Past Medical History  Diagnosis Date  . BARRETTS ESOPHAGUS 11/19/2007  . CARDIOMYOPATHY 08/02/2008  . CHANGE IN BOWELS 02/01/2009  . CHF 08/10/2008  . Diarrhea 01/31/2009  .  DIVERTICULOSIS, COLON 11/19/2007  . GASTRITIS 11/19/2007  . GERD 11/19/2007  . HIATAL HERNIA 11/19/2007  . HYPERLIPIDEMIA 06/27/2009  . HYPOTHYROIDISM 06/27/2009  . MELENA 08/01/2009  . PERSONAL HX COLONIC POLYPS 02/01/2009  . TINEA CORPORIS 06/27/2009  . TOBACCO USE, QUIT 06/27/2009  . TREMOR, ESSENTIAL 06/27/2009  . HTN (hypertension)   . IBS (irritable bowel syndrome)   . Heart murmur   . Blood transfusion without reported diagnosis    Principal Problem:   Femoral neck fracture Active Problems:   HYPERTENSION   GERD   BARRETTS ESOPHAGUS   CKD (chronic kidney disease) stage 2, GFR 60-89 ml/min   Protein-calorie malnutrition, severe   A-fib   Acute on chronic renal failure   Chronic combined systolic and diastolic CHF (congestive heart failure)   Leukocytosis, unspecified   Hyponatremia  Estimated body mass index is 26.13 kg/(m^2) as calculated from the following:   Height as of this encounter: 5\' 5"  (1.651 m).   Weight as of this encounter: 71.215 kg (157 lb). Up with therapy Follow up - in 2 weeks with Dr. Wynelle Link Activity - WBAT to the left leg Disposition - Skilled nursing facility D/C Meds - See DC Summary, RX's on chart from ortho standpoint - Lovenox, Robaxin, Tramadol, Oxycodone. DVT Prophylaxis - Lovenox for ten days total and then followed by Aspirin  Arlee Muslim, PA-C Orthopaedic Surgery 12/04/2013, 8:33 AM

## 2013-12-04 NOTE — Progress Notes (Signed)
Physical Therapy Treatment Patient Details Name: [name] MRN: [id number] DOB: 21-Jun-1921 Today's Date: Dec 07, 2013    History of Present Illness Pt is a 80 year old male admitted after fall at home attempting to put on pants in standing and now s/p L hip hemiarthoplasty.     PT Comments    Pt able to tolerate increased distance today during gait.  Pt progressing well and plans for d/c to SNF later today.  Follow Up Recommendations  SNF     Equipment Recommendations  None recommended by PT    Recommendations for Other Services       Precautions / Restrictions Precautions Precautions: Fall;Posterior Hip Restrictions Other Position/Activity Restrictions: WBAT    Mobility  Bed Mobility Overal bed mobility: Needs Assistance Bed Mobility: Supine to Sit     Supine to sit: Mod assist     General bed mobility comments: verbal cues for technique and precautions, assist for L LE and trunk upright  Transfers Overall transfer level: Needs assistance Equipment used: Rolling walker (2 wheeled) Transfers: Sit to/from [facility] Sit to Stand: Min assist;+2 safety/equipment Stand pivot transfers: Min assist;+2 safety/equipment       General transfer comment: verbal cues for safe technique within precautions, increased time to perform  Ambulation/Gait Ambulation/Gait assistance: Min assist;+2 safety/equipment Ambulation Distance (Feet): 80 Feet Assistive device: Rolling walker (2 wheeled) Gait Pattern/deviations: Step-to pattern;Antalgic;Decreased stance time - left Gait velocity: decr   General Gait Details: increased time and verbal cues for sequence, step length, RW positioning, posture, improved distance compared to yesterday with only one seated rest break needed   Stairs            Wheelchair Mobility    Modified Rankin (Stroke Patients Only)       Balance                                    Cognition  Arousal/Alertness: Awake/alert Behavior During Therapy: WFL for tasks assessed/performed Overall Cognitive Status: Within Functional Limits for tasks assessed                      Exercises      General Comments        Pertinent Vitals/Pain Pain Assessment: 0-10 Pain Score: 3  Pain Location: L hip Pain Descriptors / Indicators: Sore Pain Intervention(s): Limited activity within patient's tolerance;Monitored during session;Repositioned    Home Living                      Prior Function            PT Goals (current goals can now be found in the care plan section) Progress towards PT goals: Progressing toward goals    Frequency  Min 3X/week    PT Plan Current plan remains appropriate    Co-evaluation             End of Session Equipment Utilized During Treatment: Gait belt Activity Tolerance: Patient tolerated treatment well Patient left: in chair;with call bell/phone within reach;with family/visitor present     Time: 3151-7616 PT Time Calculation (min): 29 min  Charges:  $Gait Training: 23-37 mins                    G Codes:      [name],[name] 12/07/13, 12:15 PM [name], PT, DPT 12-07-13 Pager: [phone]

## 2013-12-07 ENCOUNTER — Non-Acute Institutional Stay (SKILLED_NURSING_FACILITY): Payer: Medicare Other | Admitting: Adult Health

## 2013-12-07 ENCOUNTER — Telehealth: Payer: Self-pay | Admitting: Internal Medicine

## 2013-12-07 DIAGNOSIS — I480 Paroxysmal atrial fibrillation: Secondary | ICD-10-CM

## 2013-12-07 DIAGNOSIS — N400 Enlarged prostate without lower urinary tract symptoms: Secondary | ICD-10-CM

## 2013-12-07 DIAGNOSIS — I509 Heart failure, unspecified: Secondary | ICD-10-CM

## 2013-12-07 DIAGNOSIS — S72009D Fracture of unspecified part of neck of unspecified femur, subsequent encounter for closed fracture with routine healing: Secondary | ICD-10-CM

## 2013-12-07 DIAGNOSIS — K219 Gastro-esophageal reflux disease without esophagitis: Secondary | ICD-10-CM

## 2013-12-07 DIAGNOSIS — E785 Hyperlipidemia, unspecified: Secondary | ICD-10-CM

## 2013-12-07 DIAGNOSIS — I10 Essential (primary) hypertension: Secondary | ICD-10-CM

## 2013-12-07 DIAGNOSIS — I4891 Unspecified atrial fibrillation: Secondary | ICD-10-CM

## 2013-12-07 DIAGNOSIS — S72002D Fracture of unspecified part of neck of left femur, subsequent encounter for closed fracture with routine healing: Secondary | ICD-10-CM

## 2013-12-07 DIAGNOSIS — I5042 Chronic combined systolic (congestive) and diastolic (congestive) heart failure: Secondary | ICD-10-CM

## 2013-12-07 NOTE — Telephone Encounter (Signed)
Pt called and states that he recently had hip replacement surgery and he is at [facility]. Pt states that on 2 separate occasions he has been eating his meal and he vomited. Pt wants to know if Dr. Hilarie Fredrickson thinks he could be having problems with his esophagus. States he is not having problems swallowing. Pt wonders if he needs an EGD done. Please advise.

## 2013-12-08 ENCOUNTER — Encounter: Payer: Self-pay | Admitting: Adult Health

## 2013-12-08 ENCOUNTER — Non-Acute Institutional Stay (SKILLED_NURSING_FACILITY): Payer: Medicare Other | Admitting: Internal Medicine

## 2013-12-08 DIAGNOSIS — S72009D Fracture of unspecified part of neck of unspecified femur, subsequent encounter for closed fracture with routine healing: Secondary | ICD-10-CM

## 2013-12-08 DIAGNOSIS — I5042 Chronic combined systolic (congestive) and diastolic (congestive) heart failure: Secondary | ICD-10-CM

## 2013-12-08 DIAGNOSIS — S72002D Fracture of unspecified part of neck of left femur, subsequent encounter for closed fracture with routine healing: Secondary | ICD-10-CM

## 2013-12-08 DIAGNOSIS — I4891 Unspecified atrial fibrillation: Secondary | ICD-10-CM

## 2013-12-08 DIAGNOSIS — I509 Heart failure, unspecified: Secondary | ICD-10-CM

## 2013-12-08 DIAGNOSIS — I480 Paroxysmal atrial fibrillation: Secondary | ICD-10-CM

## 2013-12-08 DIAGNOSIS — K219 Gastro-esophageal reflux disease without esophagitis: Secondary | ICD-10-CM

## 2013-12-08 NOTE — Progress Notes (Signed)
Patient ID: [name], male   DOB: 1921-06-02, 80 y.o.   MRN: [id number]               PROGRESS NOTE  DATE: 12/07/2013  FACILITY: Nursing Home Location: [facility]  LEVEL OF CARE: SNF (31)  Acute Visit  CHIEF COMPLAINT:  Follow-up Hospitalization  HISTORY OF PRESENT ILLNESS: This is a 79 year old male who has been admitted to [facility] on 12/04/13 from [facility] with left femoral neck fracture S/P Left hip hemiarthroplasty. He has been admitted for a short-term rehabilitation.  REASSESSMENT OF ONGOING PROBLEM(S):  HTN: Pt 's HTN remains stable.  Denies CP, sob, DOE, headaches, dizziness or visual disturbances.  No complications from the medications currently being used.  Last BP : 114/65  ATRIAL FIBRILLATION: the patients atrial fibrillation remains stable.  The patient denies DOE, tachycardia, orthopnea, transient neurological sx, palpitations, & PNDs.  No complications noted from the medications currently being used.  CHF:The patient does not relate significant weight changes, denies sob, DOE, orthopnea, PNDs, pedal edema, palpitations or chest pain.  CHF remains stable.  No complications form the medications being used.    PAST MEDICAL HISTORY : Reviewed.  No changes/see problem list  CURRENT MEDICATIONS: Reviewed per MAR/see medication list  REVIEW OF SYSTEMS:  GENERAL: no change in appetite, no fatigue, no weight changes, no fever, chills or weakness RESPIRATORY: no cough, SOB, DOE, wheezing, hemoptysis CARDIAC: no chest pain, or palpitations, +edema GI: no abdominal pain, diarrhea, constipation, heart burn, nausea or vomiting  PHYSICAL EXAMINATION  GENERAL: no acute distress, normal body habitus EYES: conjunctivae normal, sclerae normal, normal eye lids NECK: supple, trachea midline, no neck masses, no thyroid tenderness, no thyromegaly LYMPHATICS: no LAN in the neck, no supraclavicular LAN RESPIRATORY: breathing is even & unlabored,  BS CTAB CARDIAC: RRR, no murmur,no extra heart sounds, BLE edema 1+ GI: abdomen soft, normal BS, no masses, no tenderness, no hepatomegaly, no splenomegaly EXTREMITIES:  Able to move all 4 extremities; limited ROM on LLE due to surgery PSYCHIATRIC: the patient is alert & oriented to person, affect & behavior appropriate  LABS/RADIOLOGY: Labs reviewed: Basic Metabolic Panel:  Recent Labs  07/10/13 0317 07/11/13 0518  12/03/13 0410 12/03/13 1444 12/04/13 0433  NA 135* 140  < > 124* 129* 133*  K 3.7 3.8  < > 4.3 4.5 4.3  CL 107 109  < > 90* 92* 97  CO2 19 20  < > 24 24 24   GLUCOSE 101* 115*  < > 116* 113* 116*  BUN 42* 29*  < > 24* 21 19  CREATININE 1.55* 1.29  < > 0.91 0.89 0.89  CALCIUM 7.6* 7.9*  < > 9.0 8.9 8.6  MG  --  1.6  --   --   --   --   PHOS  --  2.9  --   --   --   --   < > = values in this interval not displayed. Liver Function Tests:  Recent Labs  08/10/13 0820 08/18/13 0902 11/30/13 0615  AST 12 12 13   ALT 8 8 9   ALKPHOS 82 91 107  BILITOT 0.5 0.6 0.4  PROT 6.2 6.2 6.0  ALBUMIN 3.6 3.8 2.7*    Recent Labs  11/30/13 0613  LIPASE 24   CBC:  Recent Labs  08/10/13 0820  10/26/13 1331  12/02/13 0415 12/03/13 0410 12/04/13 0433  WBC 7.9  < > 8.0  < > 10.3 9.7 8.5  NEUTROABS 5.3  --  5.7  --   --   --   --   HGB 12.1*  < > 13.9  < > 11.6* 11.4* 11.0*  HCT 36.4*  < > 42.0  < > 34.1* 33.1* 32.9*  MCV 88.6  < > 91.1  < > 87.7 85.8 87.7  PLT 260  < > 234.0  < > 274 301 300  < > = values in this interval not displayed.   Lipid Panel:  Recent Labs  08/18/13 0902  HDL 36*   CBG:  Recent Labs  07/13/13 0722 07/14/13 0732 07/15/13 0728  GLUCAP 116* 91 84    EXAM: CHEST  2 VIEW   COMPARISON:  07/08/2013 and 04/13/2013   FINDINGS: No edema or focal airspace consolidation. Lung volumes are stable. Calcified pleural plaques are again noted bilaterally. The cardio pericardial silhouette is enlarged. Bones are diffusely demineralized.  Multiple compression fractures are seen in the thoracic spine, as before.   IMPRESSION: Stable. Calcified pleural plaques consistent with prior asbestos exposure.   Osteopenia with multilevel thoracic compression fractures. EXAM: MRI OF THE LEFT HIP WITHOUT AND WITH CONTRAST   TECHNIQUE: Multiplanar, multisequence MR imaging was performed both before and after administration of intravenous contrast.   CONTRAST:  59mL MULTIHANCE GADOBENATE DIMEGLUMINE 529 MG/ML IV SOLN   COMPARISON:  11/02/2013   FINDINGS: Bones: Acute completed left femoral neck fracture, varus angulation, with considerable marrow edema along the fracture plane. Mild overlap and lateral displacement of the proximal femur with respect to the femoral head. As expected there is considerable surrounding soft tissue edema. Small left hip effusion. Edema and surrounding musculature and tracking in the left hip adductor musculature.   No sciatic nerve impingement. Low-level edema in the distal iliacus muscle of the iliopsoas appears intact distally.   Proximal hamstring tendons intact. No other fracture in the region. Lower lumbar spondylosis.   IMPRESSION: 1. Acute moderately displaced left femoral neck fracture with varus angulation. Considerable surrounding marrow and soft tissue edema. 2. Lower lumbar spondylosis. EXAM: CT HEAD WITHOUT CONTRAST   TECHNIQUE: Contiguous axial images were obtained from the base of the skull through the vertex without intravenous contrast.   COMPARISON:  06/27/2010 sinus CT.  No comparison head CT.   FINDINGS: No skull fracture or intracranial hemorrhage.   Moderate small vessel disease type changes with findings suggestive of remote small left parietal lobe infarct.   The internal carotid arteries and basilar artery appear slightly dense, not significantly dissimilar from the prior exam as may indicate the presence of thrombus. Overall, no CT evidence of large acute  infarct.   Mild global age-appropriate atrophy without hydrocephalus.   No intracranial mass lesion noted on this unenhanced exam.   Persistent polypoid opacification right sphenoid sinus with minimal polypoid opacification left sphenoid sinus and inferior aspect of the right maxillary sinus.   IMPRESSION: Moderate small vessel disease type changes without CT evidence of large acute infarct as discussed above.   No intracranial hemorrhage.   No intracranial mass lesion noted on this unenhanced exam.   Age-appropriate atrophy without hydrocephalus.   Paranasal sinus opacification as noted above.   ASSESSMENT/PLAN:  Femoral neck fracture status post left hip hemiarthroplasty - for rehabilitation Hypertension - well controlled; continue Toprol and Lasix GERD - continue Prilosec Paroxysmal atrial fibrillation - rate controlled; not anticoagulation candidate; continue Lovenox x7 days then ASA x3 weeks Chronic combined systolic and diastolic CHF - stable; continue Lasix Hyperlipidemia - continue Zocor BPH -  continue Flomax   CPT CODE: 21224  Seth Bake - NP [facility] [phone]

## 2013-12-08 NOTE — Telephone Encounter (Signed)
Likely separate issues He has recent hip fracture requiring surgery, med list notable for narcotics which can cause nausea and vomiting, but also slows stomach emptying and can lead to vomiting He does have reflux with Barrett's Barrett's with low grade dysplasia seen in Feb 2015 He was for repeat EGD as I previously recommended, but canceled by pt/family over concerns he could not tolerate it Now given change in medical condition, he needs to be re-evaluated in clinic before decision made for EGD, though if medically fit/appropriate EGD is recommended given barrett's with LGD. He should discuss nausea or vomiting with facility MD for medical management Once out of rehab, then can followup with [place] in clinic, me or APP Call if worsening

## 2013-12-09 NOTE — Telephone Encounter (Signed)
Spoke with pt and he is aware and knows to call the office after he is home from the rehab facility.

## 2013-12-09 NOTE — ED Provider Notes (Signed)
Medical screening examination/treatment/procedure(s) were performed by non-physician practitioner and as supervising physician I was immediately available for consultation/collaboration.   EKG Interpretation   Date/Time:  Monday November 30 2013 10:05:11 EDT Ventricular Rate:  77 PR Interval:  186 QRS Duration: 107 QT Interval:  403 QTC Calculation: 456 R Axis:   -28 Text Interpretation:  Sinus rhythm Ventricular premature complex  Borderline left axis deviation Borderline T wave abnormalities Confirmed  by Jeneen Rinks  MD, [place] ([id number]) on 11/30/2013 10:14:30 AM       Virgel Manifold, MD 12/09/13 1156

## 2013-12-10 DIAGNOSIS — I480 Paroxysmal atrial fibrillation: Secondary | ICD-10-CM | POA: Insufficient documentation

## 2013-12-10 NOTE — Progress Notes (Signed)
HISTORY & PHYSICAL  DATE: 12/08/2013   FACILITY: [facility]  LEVEL OF CARE: SNF (31)  ALLERGIES:  Allergies  Allergen Reactions  . Ciprofloxacin     Questionable allergy as pt reports taking med without difficulty for resp infection  . Penicillins Rash    CHIEF COMPLAINT:  Manage left femoral neck fracture, CHF and atrial fibrillation  HISTORY OF PRESENT ILLNESS: The patient is a 80 year old Caucasian male.  HIP FRACTURE: The patient had a mechanical fall and sustained a femur fracture.  Patient subsequently underwent hip hemiarthroplasty and tolerated the procedure well. Patient is admitted to this facility for short-term rehabilitation. Patient denies hip pain currently. No complications reported from the pain medications currently being used.  ATRIAL FIBRILLATION: the patients atrial fibrillation remains stable.  The patient denies DOE, tachycardia, orthopnea, transient neurological sx, pedal edema, palpitations, & PNDs.  No complications noted from the medications currently being used.  CHF:The patient does not relate significant weight changes, denies sob, DOE, orthopnea, PNDs, pedal edema, palpitations or chest pain.  CHF remains stable.  No complications form the medications being used. EF 45%.   PAST MEDICAL HISTORY :  Past Medical History  Diagnosis Date  . BARRETTS ESOPHAGUS 11/19/2007  . CARDIOMYOPATHY 08/02/2008  . CHANGE IN BOWELS 02/01/2009  . CHF 08/10/2008  . Diarrhea 01/31/2009  . DIVERTICULOSIS, COLON 11/19/2007  . GASTRITIS 11/19/2007  . GERD 11/19/2007  . HIATAL HERNIA 11/19/2007  . HYPERLIPIDEMIA 06/27/2009  . HYPOTHYROIDISM 06/27/2009  . MELENA 08/01/2009  . PERSONAL HX COLONIC POLYPS 02/01/2009  . TINEA CORPORIS 06/27/2009  . TOBACCO USE, QUIT 06/27/2009  . TREMOR, ESSENTIAL 06/27/2009  . HTN (hypertension)   . IBS (irritable bowel syndrome)   . Heart murmur   . Blood transfusion without reported diagnosis     PAST SURGICAL  HISTORY: Past Surgical History  Procedure Laterality Date  . Cholecystectomy    . Lung surgery  1984  . Umbilical hernia repair    . Tonsillectomy    . Vasectomy    . Esophagogastroduodenoscopy N/A 04/28/2013    Procedure: ESOPHAGOGASTRODUODENOSCOPY (EGD);  Surgeon: Jerene Bears, MD;  Location: [facility];  Service: Gastroenterology;  Laterality: N/A;  . Tonsillectomy    . Hernia repair    . Hip arthroplasty Left 11/30/2013    Procedure: ARTHROPLASTY BIPOLAR HIP;  Surgeon: [name], MD;  Location: [facility];  Service: Orthopedics;  Laterality: Left;    SOCIAL HISTORY:  reports that he quit smoking about 70 years ago. His smoking use included Cigarettes. He has a 72 pack-year smoking history. He has never used smokeless tobacco. He reports that he drinks about .5 ounces of alcohol per week. He reports that he does not use illicit drugs.  FAMILY HISTORY:  Family History  Problem Relation Age of Onset  . Heart disease Mother   . Colon cancer Father   . Prostate cancer Brother     CURRENT MEDICATIONS: Reviewed per MAR/see medication list  REVIEW OF SYSTEMS: GI-complains of heartburn, See HPI otherwise 14 point ROS is negative.  PHYSICAL EXAMINATION  VS:  See VS section  GENERAL: no acute distress, normal body habitus EYES: conjunctivae normal, sclerae normal, normal eye lids MOUTH/THROAT: lips without lesions,no lesions in the mouth,tongue is without lesions,uvula elevates in midline NECK: supple, trachea midline, no neck masses, no thyroid tenderness, no thyromegaly LYMPHATICS: no LAN in the neck, no supraclavicular LAN RESPIRATORY: breathing is even & unlabored, BS CTAB  CARDIAC: Heart rate is irregularly irregular, no murmur,no extra heart sounds, +1 left lower extremity edema GI:  ABDOMEN: abdomen soft, normal BS, no masses, no tenderness  LIVER/SPLEEN: no hepatomegaly, no splenomegaly MUSCULOSKELETAL: HEAD: normal to inspection  EXTREMITIES: LEFT UPPER EXTREMITY:  full range of motion, normal strength & tone RIGHT UPPER EXTREMITY:  full range of motion, normal strength & tone LEFT LOWER EXTREMITY:  range of motion not tested due to surgery, normal strength & tone RIGHT LOWER EXTREMITY:  Moderate range of motion, normal strength & tone PSYCHIATRIC: the patient is alert & oriented to person, affect & behavior appropriate  LABS/RADIOLOGY:  Labs reviewed: Basic Metabolic Panel:  Recent Labs  07/10/13 0317 07/11/13 0518  12/03/13 0410 12/03/13 1444 12/04/13 0433  NA 135* 140  < > 124* 129* 133*  K 3.7 3.8  < > 4.3 4.5 4.3  CL 107 109  < > 90* 92* 97  CO2 19 20  < > 24 24 24   GLUCOSE 101* 115*  < > 116* 113* 116*  BUN 42* 29*  < > 24* 21 19  CREATININE 1.55* 1.29  < > 0.91 0.89 0.89  CALCIUM 7.6* 7.9*  < > 9.0 8.9 8.6  MG  --  1.6  --   --   --   --   PHOS  --  2.9  --   --   --   --   < > = values in this interval not displayed. Liver Function Tests:  Recent Labs  08/10/13 0820 08/18/13 0902 11/30/13 0615  AST 12 12 13   ALT 8 8 9   ALKPHOS 82 91 107  BILITOT 0.5 0.6 0.4  PROT 6.2 6.2 6.0  ALBUMIN 3.6 3.8 2.7*    Recent Labs  11/30/13 0613  LIPASE 24   CBC:  Recent Labs  08/10/13 0820  10/26/13 1331  12/02/13 0415 12/03/13 0410 12/04/13 0433  WBC 7.9  < > 8.0  < > 10.3 9.7 8.5  NEUTROABS 5.3  --  5.7  --   --   --   --   HGB 12.1*  < > 13.9  < > 11.6* 11.4* 11.0*  HCT 36.4*  < > 42.0  < > 34.1* 33.1* 32.9*  MCV 88.6  < > 91.1  < > 87.7 85.8 87.7  PLT 260  < > 234.0  < > 274 301 300  < > = values in this interval not displayed.  Lipid Panel:  Recent Labs  08/18/13 0902  HDL 36*   CBG:  Recent Labs  07/13/13 0722 07/14/13 0732 07/15/13 0728  GLUCAP 116* 91 84    Transthoracic Echocardiography  Patient:    Isais, Klipfel MR #:       [id number] Study Date: 07/13/2013 Gender:     M Age:        68 Height:     175.3cm Weight:     68.2kg BSA:        1.3m^2 Pt. Status: Room:       [place], Simbiso  ORDERING     Ranga, Simbiso  REFERRING    Ranga, Simbiso  ADMITTING    Devona Konig  PERFORMING   Chmg, Inpatient cc:  ------------------------------------------------------------  ------------------------------------------------------------ Indications:      CHF - 428.0.  ------------------------------------------------------------ History:   PMH:  Septic shock. Community-acquired pneumonia. Acute renal failure. Chronic kidney disease. Metabolic acidosis. Acute encephalopathy.  Atrial fibrillation. Elevated BNP.  Risk factors:  Hypertension. Dyslipidemia.   ------------------------------------------------------------ Study Conclusions  - Left ventricle: Poor acoustic windows limit study. OVreall   LVEF is approximately 45% with inferior/inferoseptal   hypokinesis. The cavity size was normal. Wall thickness   was increased in a pattern of mild LVH. Doppler parameters   are consistent with abnormal left ventricular relaxation   (grade 1 diastolic dysfunction). - Aortic valve: Mild regurgitation. - Mitral valve: Mild regurgitation. - Right ventricle: The cavity size was mildly dilated. Transthoracic echocardiography.  M-mode, complete 2D, spectral Doppler, and color Doppler.  Height:  Height: 175.3cm. Height: 69in.  Weight:  Weight: 68.2kg. Weight: 150lb.  Body mass index:  BMI: 22.2kg/m^2.  Body surface area:    BSA: 1.81m^2.  Blood pressure:     127/52.  Patient status:  Inpatient.  Location:  Bedside.  ------------------------------------------------------------  ------------------------------------------------------------ Left ventricle:  Poor acoustic windows limit study. OVreall LVEF is approximately 45% with inferior/inferoseptal hypokinesis. The cavity size was normal. Wall thickness was increased in a pattern of mild LVH. Doppler parameters are consistent with abnormal left ventricular relaxation (grade 1  diastolic dysfunction).  ------------------------------------------------------------ Aortic valve:   Mildly thickened leaflets.  Doppler:   Mild regurgitation.  ------------------------------------------------------------ Mitral valve:   Mildly thickened leaflets .  Doppler:   Mild regurgitation.  ------------------------------------------------------------ Left atrium:  The atrium was normal in size.  ------------------------------------------------------------ Right ventricle:  The cavity size was mildly dilated. Systolic function was normal.  ------------------------------------------------------------ Pulmonic valve:    Structurally normal valve.   Cusp separation was normal.  Doppler:  Transvalvular velocity was within the normal range.  Mild regurgitation.  ------------------------------------------------------------ Tricuspid valve:   Structurally normal valve.   Leaflet separation was normal.  Doppler:  Transvalvular velocity was within the normal range.  Trivial regurgitation.  ------------------------------------------------------------ Right atrium:  The atrium was normal in size.  ------------------------------------------------------------ Pericardium:  There was no pericardial effusion.  ------------------------------------------------------------  2D measurements        Normal  Doppler measurements   Normal Left ventricle                 Main pulmonary LVID ED,   43.6 mm     43-52   artery chord,                         Pressure, S    29 mm   =30 PLAX                                             Hg LVID ES,   24.9 mm     23-38   Pressure ED     7 mm   ------ chord,                                           Hg PLAX                           Left ventricle FS, chord,   43 %      >29     Ea, lat ann,  9.8 cm/s ------ PLAX  tiss DP         8 LVPW, ED   12.1 mm     ------  E/Ea, lat     5.4      ------ IVS/LVPW   1.19        <1.3     ann, tiss DP    7 ratio, ED                      Ea, med ann,  6.3 cm/s ------ Ventricular septum             tiss DP         5 IVS, ED    14.4 mm     ------  E/Ea, med     8.5      ------ Aorta                          ann, tiss DP Root diam,   39 mm     ------  Mitral valve ED                             Peak E vel     54 cm/s ------ Left atrium                    Peak A vel    79. cm/s ------ AP dim       30 mm     ------                  4 AP dim     1.65 cm/m^2 <2.2    Deceleration  388 ms   150-23 index                          time                   0                                Peak E/A      0.7      ------                                ratio                                Tricuspid valve                                Regurg peak   257 cm/s ------                                vel                                Peak RV-RA     26 mm   ------  gradient, S       Hg                                Max regurg    257 cm/s ------                                vel                                Systemic veins                                Estimated CVP   3 mm   ------                                                  Hg                                Right ventricle                                Pressure, S    29 mm   <30                                                  Hg                                Sa vel, lat   18. cm/s ------                                ann, tiss DP    9                                Pulmonic valve                                Regurg vel,   103 cm/s ------                                ED   MRI OF THE LEFT HIP WITHOUT AND WITH CONTRAST   TECHNIQUE: Multiplanar, multisequence MR imaging was performed both before and after administration of intravenous contrast.   CONTRAST:  19mL MULTIHANCE GADOBENATE DIMEGLUMINE 529 MG/ML IV SOLN   COMPARISON:  11/02/2013   FINDINGS: Bones: Acute completed left femoral neck  fracture, varus angulation, with considerable marrow edema along the fracture plane. Mild overlap and lateral displacement of the proximal femur with respect to the femoral head. As expected there is considerable surrounding soft tissue edema. Small left hip  effusion. Edema and surrounding musculature and tracking in the left hip adductor musculature.   No sciatic nerve impingement. Low-level edema in the distal iliacus muscle of the iliopsoas appears intact distally.   Proximal hamstring tendons intact. No other fracture in the region. Lower lumbar spondylosis.   IMPRESSION: 1. Acute moderately displaced left femoral neck fracture with varus angulation. Considerable surrounding marrow and soft tissue edema. 2. Lower lumbar spondylosis.     CT HEAD WITHOUT CONTRAST   TECHNIQUE: Contiguous axial images were obtained from the base of the skull through the vertex without intravenous contrast.   COMPARISON:  06/27/2010 sinus CT.  No comparison head CT.   FINDINGS: No skull fracture or intracranial hemorrhage.   Moderate small vessel disease type changes with findings suggestive of remote small left parietal lobe infarct.   The internal carotid arteries and basilar artery appear slightly dense, not significantly dissimilar from the prior exam as may indicate the presence of thrombus. Overall, no CT evidence of large acute infarct.   Mild global age-appropriate atrophy without hydrocephalus.   No intracranial mass lesion noted on this unenhanced exam.   Persistent polypoid opacification right sphenoid sinus with minimal polypoid opacification left sphenoid sinus and inferior aspect of the right maxillary sinus.   IMPRESSION: Moderate small vessel disease type changes without CT evidence of large acute infarct as discussed above.   No intracranial hemorrhage.   No intracranial mass lesion noted on this unenhanced exam.   Age-appropriate atrophy without hydrocephalus.     Paranasal sinus opacification as noted above.     ASSESSMENT/PLAN:  Left femoral neck fracture-status post hemiarthroplasty. Continue rehabilitation. CHF-compensated Atrial fibrillation-rate controlled GERD-Start nexium 40 mg daily Chronic kidney disease stage III-stable Acute blood loss anemia-check hemoglobin Check CBC  I have reviewed patient's medical records received at admission/from hospitalization.  CPT CODE: 90211  Gayani Y Dasanayaka, [place] [phone]

## 2013-12-15 ENCOUNTER — Encounter: Payer: Self-pay | Admitting: Adult Health

## 2013-12-15 ENCOUNTER — Non-Acute Institutional Stay (SKILLED_NURSING_FACILITY): Payer: Medicare Other | Admitting: Adult Health

## 2013-12-15 DIAGNOSIS — S72002D Fracture of unspecified part of neck of left femur, subsequent encounter for closed fracture with routine healing: Secondary | ICD-10-CM

## 2013-12-15 DIAGNOSIS — E785 Hyperlipidemia, unspecified: Secondary | ICD-10-CM

## 2013-12-15 DIAGNOSIS — I4891 Unspecified atrial fibrillation: Secondary | ICD-10-CM

## 2013-12-15 DIAGNOSIS — K219 Gastro-esophageal reflux disease without esophagitis: Secondary | ICD-10-CM

## 2013-12-15 DIAGNOSIS — I509 Heart failure, unspecified: Secondary | ICD-10-CM

## 2013-12-15 DIAGNOSIS — I5042 Chronic combined systolic (congestive) and diastolic (congestive) heart failure: Secondary | ICD-10-CM

## 2013-12-15 DIAGNOSIS — I10 Essential (primary) hypertension: Secondary | ICD-10-CM

## 2013-12-15 DIAGNOSIS — I480 Paroxysmal atrial fibrillation: Secondary | ICD-10-CM

## 2013-12-15 DIAGNOSIS — S72009D Fracture of unspecified part of neck of unspecified femur, subsequent encounter for closed fracture with routine healing: Secondary | ICD-10-CM

## 2013-12-15 DIAGNOSIS — N400 Enlarged prostate without lower urinary tract symptoms: Secondary | ICD-10-CM

## 2013-12-15 NOTE — Progress Notes (Signed)
Patient ID: [name], male   DOB: 1921-09-17, 80 y.o.   MRN: [id number]              PROGRESS NOTE  DATE:      12/15/13  FACILITY: Nursing Home Location: [facility]  LEVEL OF CARE: SNF (31)  Acute Visit  CHIEF COMPLAINT:  Discharge Notes  HISTORY OF PRESENT ILLNESS: This is a 80 year old male who is for discharge home with Home health PT, OT, Nursing and Home health Aide. He has been admitted to [facility] on 12/04/13 from [facility] with left femoral neck fracture S/P Left hip hemiarthroplasty. Patient was admitted to this facility for short-term rehabilitation after the patient's recent hospitalization.  Patient has completed SNF rehabilitation and therapy has cleared the patient for discharge.  REASSESSMENT OF ONGOING PROBLEM(S):  HTN: Pt 's HTN remains stable.  Denies CP, sob, DOE, headaches, dizziness or visual disturbances.  No complications from the medications currently being used.  Last BP : 116/70  GERD: pt's GERD is stable.  Denies ongoing heartburn, abd. Pain, nausea or vomiting.  Currently on a PPI & tolerates it without any adverse reactions.  CHF:The patient does not relate significant weight changes, denies sob, DOE, orthopnea, PNDs,  palpitations or chest pain.  CHF remains stable.  No complications form the medications being used.  Increase in BLE edema noted.  PAST MEDICAL HISTORY : Reviewed.  No changes/see problem list  CURRENT MEDICATIONS: Reviewed per MAR/see medication list  REVIEW OF SYSTEMS:  GENERAL: no change in appetite, no fatigue, no weight changes, no fever, chills or weakness RESPIRATORY: no cough, SOB, DOE, wheezing, hemoptysis CARDIAC: no chest pain, or palpitations, +edema GI: no abdominal pain, diarrhea, constipation, heart burn, nausea or vomiting  PHYSICAL EXAMINATION  GENERAL: no acute distress, normal body habitus  NECK: supple, trachea midline, no neck masses, no thyroid tenderness, no  thyromegaly LYMPHATICS: no LAN in the neck, no supraclavicular LAN RESPIRATORY: breathing is even & unlabored, BS CTAB CARDIAC: RRR, no murmur,no extra heart sounds, BLE edema 2+ GI: abdomen soft, normal BS, no masses, no tenderness, no hepatomegaly, no splenomegaly EXTREMITIES:  Able to move all 4 extremities PSYCHIATRIC: the patient is alert & oriented to person, affect & behavior appropriate  LABS/RADIOLOGY: 12/10/13  sodium 136 potassium 4.5 glucose 99 BUN 21 creatinine 1.0 calcium 8.5 albumin 2.9  phos 3.8   12/09/13  WBC 9.9 hemoglobin 11.2 hematocrit 34.8 MCV 90.6 Labs reviewed: Basic Metabolic Panel:  Recent Labs  07/10/13 0317 07/11/13 0518  12/03/13 0410 12/03/13 1444 12/04/13 0433  NA 135* 140  < > 124* 129* 133*  K 3.7 3.8  < > 4.3 4.5 4.3  CL 107 109  < > 90* 92* 97  CO2 19 20  < > 24 24 24   GLUCOSE 101* 115*  < > 116* 113* 116*  BUN 42* 29*  < > 24* 21 19  CREATININE 1.55* 1.29  < > 0.91 0.89 0.89  CALCIUM 7.6* 7.9*  < > 9.0 8.9 8.6  MG  --  1.6  --   --   --   --   PHOS  --  2.9  --   --   --   --   < > = values in this interval not displayed. Liver Function Tests:  Recent Labs  08/10/13 0820 08/18/13 0902 11/30/13 0615  AST 12 12 13   ALT 8 8 9   ALKPHOS 82 91 107  BILITOT 0.5 0.6  0.4  PROT 6.2 6.2 6.0  ALBUMIN 3.6 3.8 2.7*    Recent Labs  11/30/13 0613  LIPASE 24   CBC:  Recent Labs  08/10/13 0820  10/26/13 1331  12/02/13 0415 12/03/13 0410 12/04/13 0433  WBC 7.9  < > 8.0  < > 10.3 9.7 8.5  NEUTROABS 5.3  --  5.7  --   --   --   --   HGB 12.1*  < > 13.9  < > 11.6* 11.4* 11.0*  HCT 36.4*  < > 42.0  < > 34.1* 33.1* 32.9*  MCV 88.6  < > 91.1  < > 87.7 85.8 87.7  PLT 260  < > 234.0  < > 274 301 300  < > = values in this interval not displayed.   Lipid Panel:  Recent Labs  08/18/13 0902  HDL 36*   CBG:  Recent Labs  07/13/13 0722 07/14/13 0732 07/15/13 0728  GLUCAP 116* 91 84    EXAM: CHEST  2 VIEW   COMPARISON:   07/08/2013 and 04/13/2013   FINDINGS: No edema or focal airspace consolidation. Lung volumes are stable. Calcified pleural plaques are again noted bilaterally. The cardio pericardial silhouette is enlarged. Bones are diffusely demineralized. Multiple compression fractures are seen in the thoracic spine, as before.   IMPRESSION: Stable. Calcified pleural plaques consistent with prior asbestos exposure.   Osteopenia with multilevel thoracic compression fractures. EXAM: MRI OF THE LEFT HIP WITHOUT AND WITH CONTRAST   TECHNIQUE: Multiplanar, multisequence MR imaging was performed both before and after administration of intravenous contrast.   CONTRAST:  38mL MULTIHANCE GADOBENATE DIMEGLUMINE 529 MG/ML IV SOLN   COMPARISON:  11/02/2013   FINDINGS: Bones: Acute completed left femoral neck fracture, varus angulation, with considerable marrow edema along the fracture plane. Mild overlap and lateral displacement of the proximal femur with respect to the femoral head. As expected there is considerable surrounding soft tissue edema. Small left hip effusion. Edema and surrounding musculature and tracking in the left hip adductor musculature.   No sciatic nerve impingement. Low-level edema in the distal iliacus muscle of the iliopsoas appears intact distally.   Proximal hamstring tendons intact. No other fracture in the region. Lower lumbar spondylosis.   IMPRESSION: 1. Acute moderately displaced left femoral neck fracture with varus angulation. Considerable surrounding marrow and soft tissue edema. 2. Lower lumbar spondylosis. EXAM: CT HEAD WITHOUT CONTRAST   TECHNIQUE: Contiguous axial images were obtained from the base of the skull through the vertex without intravenous contrast.   COMPARISON:  06/27/2010 sinus CT.  No comparison head CT.   FINDINGS: No skull fracture or intracranial hemorrhage.   Moderate small vessel disease type changes with findings suggestive of  remote small left parietal lobe infarct.   The internal carotid arteries and basilar artery appear slightly dense, not significantly dissimilar from the prior exam as may indicate the presence of thrombus. Overall, no CT evidence of large acute infarct.   Mild global age-appropriate atrophy without hydrocephalus.   No intracranial mass lesion noted on this unenhanced exam.   Persistent polypoid opacification right sphenoid sinus with minimal polypoid opacification left sphenoid sinus and inferior aspect of the right maxillary sinus.   IMPRESSION: Moderate small vessel disease type changes without CT evidence of large acute infarct as discussed above.   No intracranial hemorrhage.   No intracranial mass lesion noted on this unenhanced exam.   Age-appropriate atrophy without hydrocephalus.   Paranasal sinus opacification as noted above.   ASSESSMENT/PLAN:  Femoral neck fracture status post left hip hemiarthroplasty - for home health PT, OT, nursing and home health aide  Hypertension - well controlled; continue Toprol and Lasix GERD - continue Prilosec Paroxysmal atrial fibrillation - rate controlled; not anticoagulation candidate; continue  ASA x3 weeks Chronic combined systolic and diastolic CHF - stable;  Due to BLE edema, will increase Lasix to 40 mg by mouth daily; BMP in one week Hyperlipidemia - continue Zocor BPH - continue Flomax   I have filled out patient's discharge paperwork and written prescriptions.  Patient will receive home health PT, OT, Nursing and home health aide.  Total discharge time: Greater than 30 minutes  Discharge time involved coordination of the discharge process with social worker, nursing staff and therapy department. Medical justification for home health services verified.   CPT CODE: 15056  Seth Bake - NP [facility] [phone]

## 2013-12-19 ENCOUNTER — Encounter: Payer: Self-pay | Admitting: Gastroenterology

## 2013-12-20 ENCOUNTER — Encounter: Payer: Self-pay | Admitting: Gastroenterology

## 2013-12-23 DIAGNOSIS — S72009D Fracture of unspecified part of neck of unspecified femur, subsequent encounter for closed fracture with routine healing: Secondary | ICD-10-CM

## 2013-12-23 DIAGNOSIS — IMO0001 Reserved for inherently not codable concepts without codable children: Secondary | ICD-10-CM

## 2013-12-23 DIAGNOSIS — I509 Heart failure, unspecified: Secondary | ICD-10-CM

## 2013-12-23 DIAGNOSIS — I5042 Chronic combined systolic (congestive) and diastolic (congestive) heart failure: Secondary | ICD-10-CM

## 2013-12-25 ENCOUNTER — Telehealth: Payer: Self-pay | Admitting: Internal Medicine

## 2013-12-25 ENCOUNTER — Telehealth: Payer: Self-pay | Admitting: Family Medicine

## 2013-12-25 NOTE — Telephone Encounter (Signed)
Patient called and wants to speak to Dr. Elder Cyphers. He just had his BP checked and it was 93/43 P 84. His is asymptomatic. He is concerned about the BP reading .

## 2013-12-25 NOTE — Telephone Encounter (Signed)
Left message for patient to call back  

## 2013-12-25 NOTE — Telephone Encounter (Signed)
Patient fell and broke his hip, he is on Lovenox.  He was to have an EGD last year and wants to schedule.  He reports he has had some hypotension lately.  He will need to come for an office visit prior to EGD.  He is scheduled for 03/02/14 11:00

## 2013-12-25 NOTE — Telephone Encounter (Signed)
Have him stop the flomax and return to clinic see me Monday.

## 2013-12-25 NOTE — Telephone Encounter (Signed)
Patient's son called regarding father's BP. Informed son of Dr. Luiz Ochoa message.

## 2013-12-25 NOTE — Telephone Encounter (Signed)
Pt will be in on Monday per son.

## 2013-12-28 ENCOUNTER — Ambulatory Visit (INDEPENDENT_AMBULATORY_CARE_PROVIDER_SITE_OTHER): Payer: Medicare Other | Admitting: Internal Medicine

## 2013-12-28 VITALS — BP 94/60 | HR 80 | Temp 97.8°F | Resp 16 | Wt 146.2 lb

## 2013-12-28 DIAGNOSIS — T50905A Adverse effect of unspecified drugs, medicaments and biological substances, initial encounter: Secondary | ICD-10-CM

## 2013-12-28 DIAGNOSIS — T887XXA Unspecified adverse effect of drug or medicament, initial encounter: Secondary | ICD-10-CM

## 2013-12-28 DIAGNOSIS — R031 Nonspecific low blood-pressure reading: Secondary | ICD-10-CM

## 2013-12-28 DIAGNOSIS — Z23 Encounter for immunization: Secondary | ICD-10-CM

## 2013-12-28 NOTE — Progress Notes (Signed)
   Subjective:    Patient ID: [name], male    DOB: [birth year], 80 y.o.   MRN: [id number]  HPI [name] is a 80 yo male who presents today with low blood pressure. He stated that his son noticed his blood pressure this past Saturday (12/26/2013) and the reading was 93/43. His pulse was in the 80s at the time. Patient states he feels fine with the low blood pressure. Last week i ordered him to stop taking flomax. He agreed.  Patient also states that he recently had a hip replacement and wants the doctor to look at it to make sure he is healing properly and there are no signs of infection.   Patient states he is also here for his annual flu vaccine.     Review of Systems     Objective:   Physical Exam  Constitutional: He is oriented to person, place, and time. He appears well-developed and well-nourished. No distress.  HENT:  Head: Normocephalic.  Eyes: EOM are normal. Pupils are equal, round, and reactive to light.  Neck: Normal range of motion. Neck supple.  Cardiovascular: Normal rate, regular rhythm and normal heart sounds.   Pulmonary/Chest: Effort normal and breath sounds normal.  Musculoskeletal: Normal range of motion. He exhibits no edema and no tenderness.       Left hip: He exhibits normal range of motion, normal strength, no tenderness, no bony tenderness, no swelling, no crepitus, no deformity and no laceration.       Legs: Wound healed from fx hip replacement  Neurological: He is alert and oriented to person, place, and time. No sensory deficit. He exhibits normal muscle tone. Coordination and gait abnormal.  Using walker but ability to walk improving  Skin: Skin is warm, dry and intact.     Left hip replacemrny wound healed  Psychiatric: He has a normal mood and affect. His behavior is normal. Judgment and thought content normal.   Hip wound cleaned dressed       Assessment & Plan:  Stop taking flomax!

## 2013-12-28 NOTE — Patient Instructions (Signed)
Hypotension As your heart beats, it forces blood through your arteries. This force is your blood pressure. If your blood pressure is too low for you to go about your normal activities or to support the organs of your body, you have hypotension. Hypotension is also referred to as low blood pressure. When your blood pressure becomes too low, you may not get enough blood to your brain. As a result, you may feel weak, feel lightheaded, or develop a rapid heart rate. In a more severe case, you may faint. CAUSES Various conditions can cause hypotension. These include:  Blood loss.  Dehydration.  Heart or endocrine problems.  Pregnancy.  Severe infection.  Not having a well-balanced diet filled with needed nutrients.  Severe allergic reactions (anaphylaxis). Some medicines, such as blood pressure medicine or water pills (diuretics), may lower your blood pressure below normal. Sometimes taking too much medicine or taking medicine not as directed can cause hypotension. TREATMENT  Hospitalization is sometimes required for hypotension if fluid or blood replacement is needed, if time is needed for medicines to wear off, or if further monitoring is needed. Treatment might include changing your diet, changing your medicines (including medicines aimed at raising your blood pressure), and use of support stockings. HOME CARE INSTRUCTIONS   Drink enough fluids to keep your urine clear or pale yellow.  Take your medicines as directed by your health care provider.  Get up slowly from reclining or sitting positions. This gives your blood pressure a chance to adjust.  Wear support stockings as directed by your health care provider.  Maintain a healthy diet by including nutritious food, such as fruits, vegetables, nuts, whole grains, and lean meats. SEEK MEDICAL CARE IF:  You have vomiting or diarrhea.  You have a fever for more than 2-3 days.  You feel more thirsty than usual.  You feel weak and  tired. SEEK IMMEDIATE MEDICAL CARE IF:   You have chest pain or a fast or irregular heartbeat.  You have a loss of feeling in some part of your body, or you lose movement in your arms or legs.  You have trouble speaking.  You become sweaty or feel lightheaded.  You faint. MAKE SURE YOU:   Understand these instructions.  Will watch your condition.  Will get help right away if you are not doing well or get worse. Document Released: 03/12/2005 Document Revised: 12/31/2012 Document Reviewed: 09/12/2012 [facility] Patient Information 2015 [place], [place]. This information is not intended to replace advice given to you by your health care provider. Make sure you discuss any questions you have with your health care provider. Tamsulosin capsules What is this medicine? TAMSULOSIN (tam SOO loe sin) is used to treat enlargement of the prostate gland in men, a condition called benign prostatic hyperplasia or BPH. It is not for use in women. It works by relaxing muscles in the prostate and bladder neck. This improves urine flow and reduces BPH symptoms. This medicine may be used for other purposes; ask your health care provider or pharmacist if you have questions. COMMON BRAND NAME(S): Flomax What should I tell my health care provider before I take this medicine? They need to know if you have any of the following conditions: -advanced kidney disease -advanced liver disease -low blood pressure -prostate cancer -an unusual or allergic reaction to tamsulosin, sulfa drugs, other medicines, foods, dyes, or preservatives -pregnant or trying to get pregnant -breast-feeding How should I use this medicine? Take this medicine by mouth about 30 minutes after  the same meal every day. Follow the directions on the prescription label. Swallow the capsules whole with a glass of water. Do not crush, chew, or open capsules. Do not take your medicine more often than directed. Do not stop taking your medicine  unless your doctor tells you to. Talk to your pediatrician regarding the use of this medicine in children. Special care may be needed. Overdosage: If you think you have taken too much of this medicine contact a poison control center or emergency room at once. NOTE: This medicine is only for you. Do not share this medicine with others. What if I miss a dose? If you miss a dose, take it as soon as you can. If it is almost time for your next dose, take only that dose. Do not take double or extra doses. If you stop taking your medicine for several days or more, ask your doctor or health care professional what dose you should start back on. What may interact with this medicine? -cimetidine -fluoxetine -ketoconazole -medicines for erectile disfunction like sildenafil, tadalafil, vardenafil -medicines for high blood pressure -other alpha-blockers like alfuzosin, doxazosin, phentolamine, phenoxybenzamine, prazosin, terazosin -warfarin This list may not describe all possible interactions. Give your health care provider a list of all the medicines, herbs, non-prescription drugs, or dietary supplements you use. Also tell them if you smoke, drink alcohol, or use illegal drugs. Some items may interact with your medicine. What should I watch for while using this medicine? Visit your doctor or health care professional for regular check ups. You will need lab work done before you start this medicine and regularly while you are taking it. Check your blood pressure as directed. Ask your health care professional what your blood pressure should be, and when you should contact him or her. This medicine may make you feel dizzy or lightheaded. This is more likely to happen after the first dose, after an increase in dose, or during hot weather or exercise. Drinking alcohol and taking some medicines can make this worse. Do not drive, use machinery, or do anything that needs mental alertness until you know how this medicine  affects you. Do not sit or stand up quickly. If you begin to feel dizzy, sit down until you feel better. These effects can decrease once your body adjusts to the medicine. Contact your doctor or health care professional right away if you have an erection that lasts longer than 4 hours or if it becomes painful. This may be a sign of a serious problem and must be treated right away to prevent permanent damage. If you are thinking of having cataract surgery, tell your eye surgeon that you have taken this medicine. What side effects may I notice from receiving this medicine? Side effects that you should report to your doctor or health care professional as soon as possible: -allergic reactions like skin rash or itching, hives, swelling of the lips, mouth, tongue, or throat -breathing problems -change in vision -feeling faint or lightheaded -irregular heartbeat -prolonged or painful erection -weakness Side effects that usually do not require medical attention (report to your doctor or health care professional if they continue or are bothersome): -back pain -change in sex drive or performance -constipation, nausea or vomiting -cough -drowsy -runny or stuffy nose -trouble sleeping This list may not describe all possible side effects. Call your doctor for medical advice about side effects. You may report side effects to FDA at 1-800-FDA-1088. Where should I keep my medicine? Keep out of the reach  of children. Store at room temperature between 15 and 30 degrees C (59 and 86 degrees F). Throw away any unused medicine after the expiration date. NOTE: This sheet is a summary. It may not cover all possible information. If you have questions about this medicine, talk to your doctor, pharmacist, or health care provider.  2015, [employer]. (2012-03-12 14:11:34)

## 2014-01-25 ENCOUNTER — Encounter (INDEPENDENT_AMBULATORY_CARE_PROVIDER_SITE_OTHER): Payer: Medicare Other | Admitting: Ophthalmology

## 2014-01-25 DIAGNOSIS — H3531 Nonexudative age-related macular degeneration: Secondary | ICD-10-CM

## 2014-01-25 DIAGNOSIS — H35033 Hypertensive retinopathy, bilateral: Secondary | ICD-10-CM

## 2014-01-25 DIAGNOSIS — H3532 Exudative age-related macular degeneration: Secondary | ICD-10-CM

## 2014-01-25 DIAGNOSIS — I10 Essential (primary) hypertension: Secondary | ICD-10-CM

## 2014-01-25 DIAGNOSIS — H43813 Vitreous degeneration, bilateral: Secondary | ICD-10-CM

## 2014-02-10 ENCOUNTER — Ambulatory Visit (INDEPENDENT_AMBULATORY_CARE_PROVIDER_SITE_OTHER): Payer: Medicare Other

## 2014-02-10 ENCOUNTER — Ambulatory Visit (INDEPENDENT_AMBULATORY_CARE_PROVIDER_SITE_OTHER): Payer: Medicare Other | Admitting: Family Medicine

## 2014-02-10 VITALS — BP 140/60 | HR 83 | Temp 97.5°F | Resp 18 | Ht 65.0 in | Wt 160.2 lb

## 2014-02-10 DIAGNOSIS — Z8679 Personal history of other diseases of the circulatory system: Secondary | ICD-10-CM

## 2014-02-10 DIAGNOSIS — R609 Edema, unspecified: Secondary | ICD-10-CM

## 2014-02-10 DIAGNOSIS — Z87448 Personal history of other diseases of urinary system: Secondary | ICD-10-CM

## 2014-02-10 LAB — POCT CBC
GRANULOCYTE PERCENT: 70.2 % (ref 37–80)
HCT, POC: 40.8 % — AB (ref 43.5–53.7)
Hemoglobin: 12.7 g/dL — AB (ref 14.1–18.1)
Lymph, poc: 2 (ref 0.6–3.4)
MCH: 28.3 pg (ref 27–31.2)
MCHC: 31.2 g/dL — AB (ref 31.8–35.4)
MCV: 90.8 fL (ref 80–97)
MID (CBC): 0.7 (ref 0–0.9)
MPV: 8.2 fL (ref 0–99.8)
PLATELET COUNT, POC: 257 10*3/uL (ref 142–424)
POC GRANULOCYTE: 6.3 (ref 2–6.9)
POC LYMPH PERCENT: 22.2 %L (ref 10–50)
POC MID %: 7.6 % (ref 0–12)
RBC: 4.5 M/uL — AB (ref 4.69–6.13)
RDW, POC: 16.7 %
WBC: 9 10*3/uL (ref 4.6–10.2)

## 2014-02-10 NOTE — Patient Instructions (Signed)
Resumed taking the furosemide 20 mg 1 every day for 5 days, which will be through this coming Sunday. On Monday decrease your dose to one half pill daily. Return to get rechecked on Saturday, November 28.  Ask for Dr. Linna Darner when you come in.  If the swelling gets worse before then or you are getting short of breath please come in sooner.  i will let you know if anything changes when I see the remainder of your laboratory tests.

## 2014-02-10 NOTE — Progress Notes (Signed)
Subjective: 80 year old patient who is fairly well known to me. He came in because he's been swelling more since Dr. Just took him off his fluid pills 6 weeks ago. His electrolytes were out of line before that. He does okay except for the swelling. No other symptoms or complaints. No major shortness of breath. He doesn't want to see his heart doctor because he doesn't feel like he has anything accomplished at those visits.he is concerned about a growth on his back.  Objective: No acute distress. Deaf. Throat clear. Neck supple. No JVD noted. Chest clear to auscultation. Heart regular without murmurs. Abdomen soft without mass or tenderness. Edema to above the knees.  Assessment: Edema Lipoma on back Extreme geriatric age group History of CHF  Plan: Chest x-ray Lab  [facility] reading (PRIMARY) by  Dr. Linna Darner No major changes. Fibrotic lungs. There is a little spot in the right midlung that looks a little more prominent than last time. Will await radiologist's opinion.  Patient back on his furosemide with a decrease in dose after a few days. See him back again in about 10 days. Also need to see the results of his labs.  Results for orders placed or performed in visit on 02/10/14  POCT CBC  Result Value Ref Range   WBC 9.0 4.6 - 10.2 K/uL   Lymph, poc 2.0 0.6 - 3.4   POC LYMPH PERCENT 22.2 10 - 50 %L   MID (cbc) 0.7 0 - 0.9   POC MID % 7.6 0 - 12 %M   POC Granulocyte 6.3 2 - 6.9   Granulocyte percent 70.2 37 - 80 %G   RBC 4.50 (A) 4.69 - 6.13 M/uL   Hemoglobin 12.7 (A) 14.1 - 18.1 g/dL   HCT, POC 40.8 (A) 43.5 - 53.7 %   MCV 90.8 80 - 97 fL   MCH, POC 28.3 27 - 31.2 pg   MCHC 31.2 (A) 31.8 - 35.4 g/dL   RDW, POC 16.7 %   Platelet Count, POC 257 142 - 424 K/uL   MPV 8.2 0 - 99.8 fL   .

## 2014-02-11 LAB — LIPID PANEL
CHOL/HDL RATIO: 4.1 ratio
Cholesterol: 127 mg/dL (ref 0–200)
HDL: 31 mg/dL — ABNORMAL LOW (ref >39)
LDL Cholesterol: 66 mg/dL (ref 0–99)
Triglycerides: 150 mg/dL — ABNORMAL HIGH (ref <150)
VLDL: 30 mg/dL (ref 0–40)

## 2014-02-11 LAB — COMPREHENSIVE METABOLIC PANEL
ALT: 10 U/L (ref 0–53)
AST: 9 U/L (ref 0–37)
Albumin: 3.7 g/dL (ref 3.5–5.2)
Alkaline Phosphatase: 96 U/L (ref 39–117)
BUN: 19 mg/dL (ref 6–23)
CO2: 26 mEq/L (ref 19–32)
CREATININE: 1.08 mg/dL (ref 0.50–1.35)
Calcium: 9.3 mg/dL (ref 8.4–10.5)
Chloride: 104 mEq/L (ref 96–112)
Glucose, Bld: 87 mg/dL (ref 70–99)
POTASSIUM: 4.4 meq/L (ref 3.5–5.3)
Sodium: 142 mEq/L (ref 135–145)
Total Bilirubin: 0.5 mg/dL (ref 0.2–1.2)
Total Protein: 6.3 g/dL (ref 6.0–8.3)

## 2014-02-12 ENCOUNTER — Encounter: Payer: Self-pay | Admitting: Radiology

## 2014-02-20 ENCOUNTER — Ambulatory Visit (INDEPENDENT_AMBULATORY_CARE_PROVIDER_SITE_OTHER): Payer: Medicare Other | Admitting: Family Medicine

## 2014-02-20 VITALS — BP 128/70 | HR 96 | Temp 98.0°F | Resp 18 | Ht 65.0 in | Wt 157.2 lb

## 2014-02-20 DIAGNOSIS — N182 Chronic kidney disease, stage 2 (mild): Secondary | ICD-10-CM

## 2014-02-20 DIAGNOSIS — R609 Edema, unspecified: Secondary | ICD-10-CM

## 2014-02-20 DIAGNOSIS — M25552 Pain in left hip: Secondary | ICD-10-CM

## 2014-02-20 DIAGNOSIS — I509 Heart failure, unspecified: Secondary | ICD-10-CM

## 2014-02-20 DIAGNOSIS — G8929 Other chronic pain: Secondary | ICD-10-CM

## 2014-02-20 NOTE — Progress Notes (Signed)
Subjective: 80 year old gentleman fairly well known to me who is here for follow-up with regard to his edema. He has some chronic renal insufficiency and had hyponatremia earlier in the fall. He has been taking the fluid medicine as directed, but despite that still has daily edema up to his knees. He sometimes wears his compression stockings, sometimes wears regular stockings. He is breathing well and has no chest pains. He walks regular. He asked about how much he should drink. He doesn't like water. He drinks an occasional beer or some soda. We talked about sodium and the fact that drinking plain water is probably still his best beverage. He is not having any pain except for a little bit of pain in his left hip though he walks regularly. He takes a break and takes a nap with his legs up every day. She ate a couple Thanksgiving meals. He has not had any chest pains. No major dyspnea. His skin has the chronic itching still, and he uses some lotion that the pharmacist suggested.  Objective: Pleasant alert elderly gentleman. No JVD. Chest clear to auscultation. Heart regular with a soft systolic murmur. His legs are edematous up to the knee, 3+. No erythema or tenderness. Reviewed his labs from last visit.  Assessment: Edema Chronic congestive heart failure, stable Mild chronic renal insufficiency Hyponatremia, improved Chronic left hip pain  Plan: We are going to have him take one Lasix every day for the next 2 weeks and recheck his labs to make certain his sodium is okay at that time. It is a precarious balance in him keeping enough fluid off but keeping his renal and metabolic status stable.

## 2014-02-20 NOTE — Patient Instructions (Addendum)
Take the fluid pill furosemide 20 mg one daily  Drink plenty of water  Avoid excessive salt  Elevate legs when sitting around.    Walk daily  Return for recheck Saturday Dec 12.  Ask for Dr. Linna Darner when you sign in.

## 2014-03-02 ENCOUNTER — Ambulatory Visit (INDEPENDENT_AMBULATORY_CARE_PROVIDER_SITE_OTHER): Payer: Medicare Other | Admitting: Internal Medicine

## 2014-03-02 ENCOUNTER — Encounter: Payer: Self-pay | Admitting: Internal Medicine

## 2014-03-02 VITALS — BP 130/70 | HR 76 | Ht 65.0 in | Wt 154.5 lb

## 2014-03-02 DIAGNOSIS — K219 Gastro-esophageal reflux disease without esophagitis: Secondary | ICD-10-CM

## 2014-03-02 DIAGNOSIS — K2270 Barrett's esophagus without dysplasia: Secondary | ICD-10-CM

## 2014-03-02 NOTE — Patient Instructions (Signed)
You have been scheduled for an endoscopy. Please follow written instructions given to you at your visit today. If you use inhalers (even only as needed), please bring them with you on the day of your procedure. Your physician has requested that you go to www.startemmi.com and enter the access code given to you at your visit today. This web site gives a general overview about your procedure. However, you should still follow specific instructions given to you by our office regarding your preparation for the procedure. Continue Nexium as directed. CC:  Lou Miner MD

## 2014-03-02 NOTE — Progress Notes (Signed)
Subjective:    Patient ID: [name], male    DOB: [birth year], 80 y.o.   MRN: [id number]  HPI Johnny Gorter is a 80 year old male with a past medical history of Barrett's esophagus, most recently with low-grade dysplasia in February 2015, GERD, afib, CHF with last EF 45% earlier this year, history of diverticulosis, history of colonic polyps, hypertension, IBS, hypothyroidism and hyperlipidemia who is seen for follow-up. He was scheduled for upper endoscopy for surveillance of Barrett's esophagus but unfortunately he fell when getting dressed and broke a hip. This required repair which he has recovered from remarkably well. He has been in rehabilitation and he is now walking unassisted without a walker. He is also driving again and drove himself to this appointment. He reports he is appetite has been fair but he denies dysphagia or odynophagia. No heartburn. He is using Nexium 40 mg daily. Denies abdominal pain. Reports regular bowel movements without blood in his stool or melena. He is focusing on a high-fiber diet. He is interested in pursuing follow-up endoscopy and states "I plan to live past 89".   Review of Systems As per HPI, otherwise negative  Current Medications, Allergies, Past Medical History, Past Surgical History, Family History and Social History were reviewed in [employer] record.     Objective:   Physical Exam BP 130/70 mmHg  Pulse 76  Ht 5\' 5"  (1.651 m)  Wt 154 lb 8 oz (70.081 kg)  BMI 25.71 kg/m2 Constitutional: Well-developed and well-nourished. No distress. HEENT: Normocephalic and atraumatic.  Conjunctivae are normal.  No scleral icterus. Neck: Neck supple. Trachea midline. Cardiovascular: Normal rate, regular rhythm and intact distal pulses. Pulmonary/chest: Effort normal and breath sounds normal. No wheezing, rales or rhonchi. Abdominal: Soft, nontender, nondistended. Bowel sounds active throughout.  Extremities: no clubbing,  cyanosis, or edema Neurological: Alert and oriented to person place and time. Psychiatric: Normal mood and affect. Behavior is normal.  CBC    Component Value Date/Time   WBC 9.0 02/10/2014 1232   WBC 8.5 12/04/2013 0433   RBC 4.50* 02/10/2014 1232   RBC 3.75* 12/04/2013 0433   HGB 12.7* 02/10/2014 1232   HGB 11.0* 12/04/2013 0433   HCT 40.8* 02/10/2014 1232   HCT 32.9* 12/04/2013 0433   PLT 300 12/04/2013 0433   MCV 90.8 02/10/2014 1232   MCV 87.7 12/04/2013 0433   MCH 28.3 02/10/2014 1232   MCH 29.3 12/04/2013 0433   MCHC 31.2* 02/10/2014 1232   MCHC 33.4 12/04/2013 0433   RDW 12.6 12/04/2013 0433   LYMPHSABS 1.4 10/26/2013 1331   MONOABS 0.7 10/26/2013 1331   EOSABS 0.2 10/26/2013 1331   BASOSABS 0.0 10/26/2013 1331    CMP     Component Value Date/Time   NA 142 02/10/2014 1220   K 4.4 02/10/2014 1220   CL 104 02/10/2014 1220   CO2 26 02/10/2014 1220   GLUCOSE 87 02/10/2014 1220   BUN 19 02/10/2014 1220   CREATININE 1.08 02/10/2014 1220   CREATININE 0.89 12/04/2013 0433   CALCIUM 9.3 02/10/2014 1220   PROT 6.3 02/10/2014 1220   ALBUMIN 3.7 02/10/2014 1220   AST 9 02/10/2014 1220   ALT 10 02/10/2014 1220   ALKPHOS 96 02/10/2014 1220   BILITOT 0.5 02/10/2014 1220   GFRNONAA 73* 12/04/2013 0433   GFRNONAA 63 08/18/2013 0902   GFRAA 85* 12/04/2013 0433   GFRAA 73 08/18/2013 0902    EGD -- reviewed      Assessment & Plan:  80 year old male with a past medical history of Barrett's esophagus, most recently with low-grade dysplasia in February 2015, GERD, afib, CHF with last EF 45% earlier this year, history of diverticulosis, history of colonic polyps, hypertension, IBS, hypothyroidism and hyperlipidemia who is seen for follow-up.  1. Barrett's with low-grade dysplasia in Feb 2015 -- given his Barrett's esophagus with low-grade dysplasia, had previous he recommended surveillance endoscopy in 6 months which would've been August. This was delayed due to other  health concerns. Today he looks remarkably well and he would like to pursue follow-up endoscopy. We discussed the risks and benefits and he is agreeable to proceed. I do want him to continue Nexium 40 mg daily indefinitely. There is a question as to whether he remains on anticoagulation, and if so this will need to be held before his endoscopy. We will contact his living facility to determine if he is on anticoagulant therapy.  --Further recommendations after endoscopy and based on pathology findings.

## 2014-03-06 ENCOUNTER — Ambulatory Visit (INDEPENDENT_AMBULATORY_CARE_PROVIDER_SITE_OTHER): Payer: Medicare Other | Admitting: Internal Medicine

## 2014-03-06 VITALS — BP 110/62 | HR 66 | Temp 97.8°F | Resp 16 | Ht 65.0 in | Wt 154.0 lb

## 2014-03-06 DIAGNOSIS — N182 Chronic kidney disease, stage 2 (mild): Secondary | ICD-10-CM

## 2014-03-06 DIAGNOSIS — R609 Edema, unspecified: Secondary | ICD-10-CM

## 2014-03-06 DIAGNOSIS — I509 Heart failure, unspecified: Secondary | ICD-10-CM

## 2014-03-06 DIAGNOSIS — N2889 Other specified disorders of kidney and ureter: Secondary | ICD-10-CM

## 2014-03-06 DIAGNOSIS — Z5181 Encounter for therapeutic drug level monitoring: Secondary | ICD-10-CM

## 2014-03-06 LAB — BASIC METABOLIC PANEL WITH GFR
BUN: 29 mg/dL — ABNORMAL HIGH (ref 6–23)
CO2: 28 meq/L (ref 19–32)
Calcium: 9.6 mg/dL (ref 8.4–10.5)
Chloride: 102 mEq/L (ref 96–112)
Creat: 1.36 mg/dL — ABNORMAL HIGH (ref 0.50–1.35)
GFR, Est African American: 53 mL/min — ABNORMAL LOW
GFR, Est Non African American: 45 mL/min — ABNORMAL LOW
GLUCOSE: 87 mg/dL (ref 70–99)
POTASSIUM: 4.4 meq/L (ref 3.5–5.3)
SODIUM: 139 meq/L (ref 135–145)

## 2014-03-06 NOTE — Patient Instructions (Signed)
Peripheral Edema °You have swelling in your legs (peripheral edema). This swelling is due to excess accumulation of salt and water in your body. Edema may be a sign of heart, kidney or liver disease, or a side effect of a medication. It may also be due to problems in the leg veins. Elevating your legs and using special support stockings may be very helpful, if the cause of the swelling is due to poor venous circulation. Avoid long periods of standing, whatever the cause. °Treatment of edema depends on identifying the cause. Chips, pretzels, pickles and other salty foods should be avoided. Restricting salt in your diet is almost always needed. Water pills (diuretics) are often used to remove the excess salt and water from your body via urine. These medicines prevent the kidney from reabsorbing sodium. This increases urine flow. °Diuretic treatment may also result in lowering of potassium levels in your body. Potassium supplements may be needed if you have to use diuretics daily. Daily weights can help you keep track of your progress in clearing your edema. You should call your caregiver for follow up care as recommended. °SEEK IMMEDIATE MEDICAL CARE IF:  °· You have increased swelling, pain, redness, or heat in your legs. °· You develop shortness of breath, especially when lying down. °· You develop chest or abdominal pain, weakness, or fainting. °· You have a fever. °Document Released: 04/19/2004 Document Revised: 06/04/2011 Document Reviewed: 03/30/2009 °ExitCare® Patient Information ©2015 [employer]. This information is not intended to replace advice given to you by your health care provider. Make sure you discuss any questions you have with your health care provider. ° °

## 2014-03-06 NOTE — Progress Notes (Signed)
   Subjective:    Patient ID: [name], male    DOB: Jun 12, 1921, 80 y.o.   MRN: [id number]  HPI Feels good, edema still 2+ but hes better. No dizzy or syncope, pressure 108/68. See Dr. Darrol Angel eval and labs.Has chronic chf and crd.   Review of Systems     Objective:   Physical Exam  Constitutional: He is oriented to person, place, and time. He appears well-developed and well-nourished. No distress.  HENT:  Head: Normocephalic.  Mouth/Throat: Oropharynx is clear and moist.  Eyes: EOM are normal. Pupils are equal, round, and reactive to light.  Neck: Normal range of motion.  Cardiovascular: Normal rate, regular rhythm and normal heart sounds.   Pulmonary/Chest: Effort normal and breath sounds normal.  Musculoskeletal: He exhibits edema.  Neurological: He is alert and oriented to person, place, and time. He exhibits normal muscle tone. Coordination normal.  Skin: No rash noted.  Psychiatric: He has a normal mood and affect.  Vitals reviewed.  128/60 lying  126/68 110/62 stand  110/71  bmet     Assessment & Plan:  Tolerating lasix 20mg  Will try QOD, then use daily if worsening See your cardiologist about this in next 1-2 months RTC here 1 month if cannot see cardio.

## 2014-03-15 ENCOUNTER — Ambulatory Visit (AMBULATORY_SURGERY_CENTER): Payer: Medicare Other | Admitting: Internal Medicine

## 2014-03-15 ENCOUNTER — Encounter: Payer: Self-pay | Admitting: Internal Medicine

## 2014-03-15 ENCOUNTER — Encounter (INDEPENDENT_AMBULATORY_CARE_PROVIDER_SITE_OTHER): Payer: Medicare Other | Admitting: Ophthalmology

## 2014-03-15 VITALS — BP 109/59 | HR 60 | Temp 97.2°F | Resp 18 | Ht 65.0 in | Wt 154.0 lb

## 2014-03-15 DIAGNOSIS — K2270 Barrett's esophagus without dysplasia: Secondary | ICD-10-CM

## 2014-03-15 MED ORDER — SODIUM CHLORIDE 0.9 % IV SOLN: 500.0000 mL | INTRAVENOUS | Status: DC

## 2014-03-15 NOTE — Progress Notes (Signed)
Called to room to assist during endoscopic procedure.  Patient ID and intended procedure confirmed with present staff. Received instructions for my participation in the procedure from the performing physician.  

## 2014-03-15 NOTE — Patient Instructions (Signed)
Continue your Nexium daily, 20-30 minutes prior to morning meal.  YOU HAD AN ENDOSCOPIC PROCEDURE TODAY AT [facility]: Refer to the procedure report that was given to you for any specific questions about what was found during the examination.  If the procedure report does not answer your questions, please call your gastroenterologist to clarify.  If you requested that your care partner not be given the details of your procedure findings, then the procedure report has been included in a sealed envelope for you to review at your convenience later.  YOU SHOULD EXPECT: Some feelings of bloating in the abdomen. Passage of more gas than usual.  Walking can help get rid of the air that was put into your GI tract during the procedure and reduce the bloating. If you had a lower endoscopy (such as a colonoscopy or flexible sigmoidoscopy) you may notice spotting of blood in your stool or on the toilet paper. If you underwent a bowel prep for your procedure, then you may not have a normal bowel movement for a few days.  DIET: Your first meal following the procedure should be a light meal and then it is ok to progress to your normal diet.  A half-sandwich or bowl of soup is an example of a good first meal.  Heavy or fried foods are harder to digest and may make you feel nauseous or bloated.  Likewise meals heavy in dairy and vegetables can cause extra gas to form and this can also increase the bloating.  Drink plenty of fluids but you should avoid alcoholic beverages for 24 hours.  ACTIVITY: Your care partner should take you home directly after the procedure.  You should plan to take it easy, moving slowly for the rest of the day.  You can resume normal activity the day after the procedure however you should NOT DRIVE or use heavy machinery for 24 hours (because of the sedation medicines used during the test).    SYMPTOMS TO REPORT IMMEDIATELY: A gastroenterologist can be reached at any hour.  During  normal business hours, 8:30 AM to 5:00 PM Monday through Friday, call [phone].  After hours and on weekends, please call the GI answering service at [phone] who will take a message and have the physician on call contact you.   Following upper endoscopy (EGD)  Vomiting of blood or coffee ground material  New chest pain or pain under the shoulder blades  Painful or persistently difficult swallowing  New shortness of breath  Fever of 100F or higher  Black, tarry-looking stools  FOLLOW UP: If any biopsies were taken you will be contacted by phone or by letter within the next 1-3 weeks.  Call your gastroenterologist if you have not heard about the biopsies in 3 weeks.  Our staff will call the home number listed on your records the next business day following your procedure to check on you and address any questions or concerns that you may have at that time regarding the information given to you following your procedure. This is a courtesy call and so if there is no answer at the home number and we have not heard from you through the emergency physician on call, we will assume that you have returned to your regular daily activities without incident.  SIGNATURES/CONFIDENTIALITY: You and/or your care partner have signed paperwork which will be entered into your electronic medical record.  These signatures attest to the fact that that the information above on your After  Visit Summary has been reviewed and is understood.  Full responsibility of the confidentiality of this discharge information lies with you and/or your care-partner. 

## 2014-03-15 NOTE — Op Note (Signed)
[facility] [facility]  [address]. [place], [address]   ENDOSCOPY PROCEDURE REPORT  PATIENT: [name], [name]  MR#: [id number] BIRTHDATE: 1921/12/18 , 89  yrs. old GENDER: male ENDOSCOPIST: Jerene Bears, MD PROCEDURE DATE:  03/15/2014 PROCEDURE:  EGD w/ biopsy ASA CLASS:     Class III INDICATIONS:  Barrett's low grade dysplasia, last EGD Feb 2015. MEDICATIONS: Monitored anesthesia care and Propofol 80 mg IV TOPICAL ANESTHETIC: none  DESCRIPTION OF PROCEDURE: After the risks benefits and alternatives of the procedure were thoroughly explained, informed consent was obtained.  The LB [id number] endoscope was introduced through the mouth and advanced to the second portion of the duodenum , Without limitations.  The instrument was slowly withdrawn as the mucosa was fully examined.  ESOPHAGUS: There was a 5cm segment of Barrett's esophagus found 30 cm from the incisors extending to the top of the gastric folds at 35 cm..  The length of circumferential Barrett's was 2cm ([place] C2).  There was no nodular mucosa noted in the Barrett's segment. Cold forceps were used and four quadrant biopsies for were taken every 2cm within the Barrett's segment.  STOMACH: A 4 cm hiatal hernia was noted.   The mucosa of the stomach appeared normal.  DUODENUM: A medium sized diverticulum was found in the peri-ampullary duodenumm. Otherwise normal duodenal mucosa in the bulb and second portion. Retroflexed views revealed a hiatal hernia.     The scope was then withdrawn from the patient and the procedure completed.  COMPLICATIONS: There were no immediate complications.  ENDOSCOPIC IMPRESSION: 1.   There was a 5cm segment of Barrett's esophagus, endoscopically unchanged; 4-quadrant biopsies performed 2.   4 cm hiatal hernia 3.   The mucosa of the stomach appeared normal 4.   Diverticulum was found in the peri-ampullary duodenum with normal examined duodenal  mucosa  RECOMMENDATIONS: 1.  Await biopsy results 2.  Continue taking your PPI (Nexium) once daily.  It is best to be taken 20-30 minutes prior to breakfast meal.  eSigned:  Jerene Bears, MD 03/15/2014 8:23 AM    VQ:[id number] Guest, MD and The Patient

## 2014-03-15 NOTE — Progress Notes (Signed)
A/ox3 pleased with MAC, report to Karen RN 

## 2014-03-16 ENCOUNTER — Telehealth: Payer: Self-pay | Admitting: *Deleted

## 2014-03-16 NOTE — Telephone Encounter (Signed)
  Follow up Call-  Call back number 03/15/2014  Post procedure Call Back phone  # [phone]  Permission to leave phone message Yes     Patient questions:  Do you have a fever, pain , or abdominal swelling? No. Pain Score  0 *  Have you tolerated food without any problems? Yes.    Have you been able to return to your normal activities? Yes.    Do you have any questions about your discharge instructions: Diet   No. Medications  No. Follow up visit  No.  Do you have questions or concerns about your Care? No.  Actions: * If pain score is 4 or above: No action needed, pain <4.

## 2014-03-17 ENCOUNTER — Encounter (INDEPENDENT_AMBULATORY_CARE_PROVIDER_SITE_OTHER): Payer: Medicare Other | Admitting: Ophthalmology

## 2014-03-17 DIAGNOSIS — H3532 Exudative age-related macular degeneration: Secondary | ICD-10-CM

## 2014-03-17 DIAGNOSIS — I10 Essential (primary) hypertension: Secondary | ICD-10-CM | POA: Diagnosis not present

## 2014-03-17 DIAGNOSIS — H3531 Nonexudative age-related macular degeneration: Secondary | ICD-10-CM | POA: Diagnosis not present

## 2014-03-17 DIAGNOSIS — H35033 Hypertensive retinopathy, bilateral: Secondary | ICD-10-CM

## 2014-03-17 DIAGNOSIS — H43813 Vitreous degeneration, bilateral: Secondary | ICD-10-CM

## 2014-03-29 ENCOUNTER — Encounter: Payer: Self-pay | Admitting: Internal Medicine

## 2014-03-29 DIAGNOSIS — M6281 Muscle weakness (generalized): Secondary | ICD-10-CM | POA: Diagnosis not present

## 2014-04-01 DIAGNOSIS — M6281 Muscle weakness (generalized): Secondary | ICD-10-CM | POA: Diagnosis not present

## 2014-04-02 ENCOUNTER — Ambulatory Visit (INDEPENDENT_AMBULATORY_CARE_PROVIDER_SITE_OTHER): Payer: Medicare Other | Admitting: Internal Medicine

## 2014-04-02 VITALS — BP 114/64 | HR 65 | Temp 98.1°F | Resp 16 | Ht 65.0 in | Wt 153.0 lb

## 2014-04-02 DIAGNOSIS — R609 Edema, unspecified: Secondary | ICD-10-CM

## 2014-04-02 DIAGNOSIS — Z79899 Other long term (current) drug therapy: Secondary | ICD-10-CM | POA: Diagnosis not present

## 2014-04-02 DIAGNOSIS — N289 Disorder of kidney and ureter, unspecified: Secondary | ICD-10-CM | POA: Diagnosis not present

## 2014-04-02 LAB — BASIC METABOLIC PANEL
BUN: 44 mg/dL — ABNORMAL HIGH (ref 6–23)
CALCIUM: 9.6 mg/dL (ref 8.4–10.5)
CO2: 26 mEq/L (ref 19–32)
CREATININE: 1.45 mg/dL — AB (ref 0.50–1.35)
Chloride: 101 mEq/L (ref 96–112)
GLUCOSE: 107 mg/dL — AB (ref 70–99)
Potassium: 4.9 mEq/L (ref 3.5–5.3)
Sodium: 137 mEq/L (ref 135–145)

## 2014-04-02 NOTE — Patient Instructions (Signed)
Edema Edema is an abnormal buildup of fluids in your bodytissues. Edema is somewhatdependent on gravity to pull the fluid to the lowest place in your body. That makes the condition more common in the legs and thighs (lower extremities). Painless swelling of the feet and ankles is common and becomes more likely as you get older. It is also common in looser tissues, like around your eyes.  When the affected area is squeezed, the fluid may move out of that spot and leave a dent for a few moments. This dent is called pitting.  CAUSES  There are many possible causes of edema. Eating too much salt and being on your feet or sitting for a long time can cause edema in your legs and ankles. Hot weather may make edema worse. Common medical causes of edema include:  Heart failure.  Liver disease.  Kidney disease.  Weak blood vessels in your legs.  Cancer.  An injury.  Pregnancy.  Some medications.  Obesity. SYMPTOMS  Edema is usually painless.Your skin may look swollen or shiny.  DIAGNOSIS  Your health care provider may be able to diagnose edema by asking about your medical history and doing a physical exam. You may need to have tests such as X-rays, an electrocardiogram, or blood tests to check for medical conditions that may cause edema.  TREATMENT  Edema treatment depends on the cause. If you have heart, liver, or kidney disease, you need the treatment appropriate for these conditions. General treatment may include:  Elevation of the affected body part above the level of your heart.  Compression of the affected body part. Pressure from elastic bandages or support stockings squeezes the tissues and forces fluid back into the blood vessels. This keeps fluid from entering the tissues.  Restriction of fluid and salt intake.  Use of a water pill (diuretic). These medications are appropriate only for some types of edema. They pull fluid out of your body and make you urinate more often. This  gets rid of fluid and reduces swelling, but diuretics can have side effects. Only use diuretics as directed by your health care provider. HOME CARE INSTRUCTIONS   Keep the affected body part above the level of your heart when you are lying down.   Do not sit still or stand for prolonged periods.   Do not put anything directly under your knees when lying down.  Do not wear constricting clothing or garters on your upper legs.   Exercise your legs to work the fluid back into your blood vessels. This may help the swelling go down.   Wear elastic bandages or support stockings to reduce ankle swelling as directed by your health care provider.   Eat a low-salt diet to reduce fluid if your health care provider recommends it.   Only take medicines as directed by your health care provider. SEEK MEDICAL CARE IF:   Your edema is not responding to treatment.  You have heart, liver, or kidney disease and notice symptoms of edema.  You have edema in your legs that does not improve after elevating them.   You have sudden and unexplained weight gain. SEEK IMMEDIATE MEDICAL CARE IF:   You develop shortness of breath or chest pain.   You cannot breathe when you lie down.  You develop pain, redness, or warmth in the swollen areas.   You have heart, liver, or kidney disease and suddenly get edema.  You have a fever and your symptoms suddenly get worse. MAKE SURE YOU:     Understand these instructions.  Will watch your condition.  Will get help right away if you are not doing well or get worse. Document Released: 03/12/2005 Document Revised: 07/27/2013 Document Reviewed: 01/02/2013 [facility] Patient Information 2015 [place], [place]. This information is not intended to replace advice given to you by your health care provider. Make sure you discuss any questions you have with your health care provider. Chronic Kidney Disease Chronic kidney disease occurs when the kidneys are damaged  over a long period. The kidneys are two organs that lie on either side of the spine between the middle of the back and the front of the abdomen. The kidneys:   Remove wastes and extra water from the blood.   Produce important hormones. These help keep bones strong, regulate blood pressure, and help create red blood cells.   Balance the fluids and chemicals in the blood and tissues. A small amount of kidney damage may not cause problems, but a large amount of damage may make it difficult or impossible for the kidneys to work the way they should. If steps are not taken to slow down the kidney damage or stop it from getting worse, the kidneys may stop working permanently. Most of the time, chronic kidney disease does not go away. However, it can often be controlled, and those with the disease can usually live normal lives. CAUSES  The most common causes of chronic kidney disease are diabetes and high blood pressure (hypertension). Chronic kidney disease may also be caused by:   Diseases that cause the kidneys' filters to become inflamed.   Diseases that affect the immune system.   Genetic diseases.   Medicines that damage the kidneys, such as anti-inflammatory medicines.  Poisoning or exposure to toxic substances.   A reoccurring kidney or urinary infection.   A problem with urine flow. This may be caused by:   Cancer.   Kidney stones.   An enlarged prostate in males. SIGNS AND SYMPTOMS  Because the kidney damage in chronic kidney disease occurs slowly, symptoms develop slowly and may not be obvious until the kidney damage becomes severe. A person may have a kidney disease for years without showing any symptoms. Symptoms can include:   Swelling (edema) of the legs, ankles, or feet.   Tiredness (lethargy).   Nausea or vomiting.   Confusion.   Problems with urination, such as:   Decreased urine production.   Frequent urination, especially at night.    Frequent accidents in children who are potty trained.   Muscle twitches and cramps.   Shortness of breath.  Weakness.   Persistent itchiness.   Loss of appetite.  Metallic taste in the mouth.  Trouble sleeping.  Slowed development in children.  Short stature in children. DIAGNOSIS  Chronic kidney disease may be detected and diagnosed by tests, including blood, urine, imaging, or kidney biopsy tests.  TREATMENT  Most chronic kidney diseases cannot be cured. Treatment usually involves relieving symptoms and preventing or slowing the progression of the disease. Treatment may include:   A special diet. You may need to avoid alcohol and foods thatare salty and high in potassium.   Medicines. These may:   Lower blood pressure.   Relieve anemia.   Relieve swelling.   Protect the bones. HOME CARE INSTRUCTIONS   Follow your prescribed diet.   Take medicines only as directed by your health care provider. Do not take any new medicines (prescription, over-the-counter, or nutritional supplements) unless approved by your health care provider. Many medicines  can worsen your kidney damage or need to have the dose adjusted.   Quit smoking if you smoke. Talk to your health care provider about a smoking cessation program.   Keep all follow-up visits as directed by your health care provider. SEEK IMMEDIATE MEDICAL CARE IF:  Your symptoms get worse or you develop new symptoms.   You develop symptoms of end-stage kidney disease. These include:   Headaches.   Abnormally dark or light skin.   Numbness in the hands or feet.   Easy bruising.   Frequent hiccups.   Menstruation stops.   You have a fever.   You have decreased urine production.   You havepain or bleeding when urinating. MAKE SURE YOU:  Understand these instructions.  Will watch your condition.  Will get help right away if you are not doing well or get worse. FOR MORE  INFORMATION   American Association of Kidney Patients: BombTimer.gl  National Kidney Foundation: www.kidney.[place]: https://mathis.com/  [facility]: www.lifeoptions.org and www.kidneyschool.org Document Released: 12/20/2007 Document Revised: 07/27/2013 Document Reviewed: 11/09/2011 [facility] Patient Information 2015 [place], [place]. This information is not intended to replace advice given to you by your health care provider. Make sure you discuss any questions you have with your health care provider.

## 2014-04-02 NOTE — Progress Notes (Signed)
   Subjective:    Patient ID: [name], male    DOB: 1921/12/11, 81 y.o.   MRN: [id number]  HPI Has lower leg edema from chronic congestive heart failure. Has rising creatinine on last visit. He feels good and edema is contolled even tho I reduced lasix dose to 20mg  QOD.  Review of Systems     Objective:   Physical Exam  Constitutional: He is oriented to person, place, and time. He appears well-developed and well-nourished. No distress.  HENT:  Head: Normocephalic.  Eyes: Conjunctivae and EOM are normal. Pupils are equal, round, and reactive to light. No scleral icterus.  Neck: Normal range of motion.  Cardiovascular: Normal rate, regular rhythm and normal heart sounds.   Pulmonary/Chest: Effort normal and breath sounds normal.  Musculoskeletal: Normal range of motion. He exhibits edema.  Neurological: He is alert and oriented to person, place, and time. He exhibits normal muscle tone. Coordination normal.  Psychiatric: He has a normal mood and affect. His behavior is normal.    bmet      Assessment & Plan:  CHF with mild edema See Dr. Harrington Challenger cardio soon Continue same lasix qod

## 2014-04-05 DIAGNOSIS — M6281 Muscle weakness (generalized): Secondary | ICD-10-CM | POA: Diagnosis not present

## 2014-04-05 DIAGNOSIS — R339 Retention of urine, unspecified: Secondary | ICD-10-CM | POA: Diagnosis not present

## 2014-04-05 DIAGNOSIS — N401 Enlarged prostate with lower urinary tract symptoms: Secondary | ICD-10-CM | POA: Diagnosis not present

## 2014-04-22 ENCOUNTER — Other Ambulatory Visit: Payer: Self-pay | Admitting: Internal Medicine

## 2014-04-28 NOTE — Progress Notes (Signed)
HPI HPIPatient is an 81 year old with a history of presumed NICM (norml perfusion on myoview), hypertension and dyslipidemia  The patient was admittted in April pneumonia/sepsis.  He was in atrial fibrillation duing admit.  Note echo done during admit LVEF was 45%   I saw him in clinic in August 2015 Since then he fell and broke his hip (while putting on pants)  Had surgery Hosp this winter with pneumonia Has been seen by C Guest for edema  Lasix given and now cut back to qod  The patient denies dizziness  No CP  No SOB  Nof falls  No palpitations  No PND   Allergies  Allergen Reactions  . Ciprofloxacin     Questionable allergy as pt reports taking med without difficulty for resp infection  . Penicillins Rash    Current Outpatient Prescriptions  Medication Sig Dispense Refill  . enoxaparin (LOVENOX) 40 MG/0.4ML injection Inject 0.4 mLs (40 mg total) into the skin daily. Take injections for seven more days and then switch to a baby 81 mg Aspirin daily for three more weeks. (Patient not taking: Reported on 04/02/2014) 7 Syringe 0  . esomeprazole (NEXIUM) 40 MG capsule Take 1 capsule (40 mg total) by mouth daily at 12 noon. 30 capsule 6  . feeding supplement, ENSURE COMPLETE, (ENSURE COMPLETE) LIQD Take 237 mLs by mouth at bedtime. 30 Bottle 1  . furosemide (LASIX) 20 MG tablet Take 1-2 tablets (20-40 mg total) by mouth daily. Take 20mg  one day then alternate with 40mg  the next day 30 tablet 0  . lisinopril-hydrochlorothiazide (PRINZIDE,ZESTORETIC) 10-12.5 MG per tablet     . lisinopril-hydrochlorothiazide (PRINZIDE,ZESTORETIC) 10-12.5 MG per tablet Take 1 tablet by mouth  daily with breakfast 90 tablet 0  . metoprolol succinate (TOPROL-XL) 25 MG 24 hr tablet Take 1 tablet (25 mg total) by mouth daily with breakfast. 30 tablet 0  . OVER THE COUNTER MEDICATION Take 1 tablet by mouth daily. Over the counter probiotic (not align)    . simvastatin (ZOCOR) 20 MG tablet Take 1 tablet (20 mg total) by  mouth daily. 90 tablet 3  . Tamsulosin HCl (FLOMAX) 0.4 MG CAPS Take 0.4 mg by mouth daily.      No current facility-administered medications for this visit.    Past Medical History  Diagnosis Date  . BARRETTS ESOPHAGUS 11/19/2007  . CARDIOMYOPATHY 08/02/2008  . CHANGE IN BOWELS 02/01/2009  . CHF 08/10/2008  . Diarrhea 01/31/2009  . DIVERTICULOSIS, COLON 11/19/2007  . GASTRITIS 11/19/2007  . GERD 11/19/2007  . HIATAL HERNIA 11/19/2007  . HYPERLIPIDEMIA 06/27/2009  . HYPOTHYROIDISM 06/27/2009  . MELENA 08/01/2009  . PERSONAL HX COLONIC POLYPS 02/01/2009  . TINEA CORPORIS 06/27/2009  . TOBACCO USE, QUIT 06/27/2009  . TREMOR, ESSENTIAL 06/27/2009  . HTN (hypertension)   . IBS (irritable bowel syndrome)   . Heart murmur   . Blood transfusion without reported diagnosis     Past Surgical History  Procedure Laterality Date  . Cholecystectomy    . Lung surgery  1984  . Umbilical hernia repair    . Tonsillectomy    . Vasectomy    . Esophagogastroduodenoscopy N/A 04/28/2013    Procedure: ESOPHAGOGASTRODUODENOSCOPY (EGD);  Surgeon: Jerene Bears, MD;  Location: [facility];  Service: Gastroenterology;  Laterality: N/A;  . Tonsillectomy    . Hernia repair    . Hip arthroplasty Left 11/30/2013    Procedure: ARTHROPLASTY BIPOLAR HIP;  Surgeon: [name], MD;  Location: [facility];  Service: Orthopedics;  Laterality: Left;    Family History  Problem Relation Age of Onset  . Heart disease Mother   . Colon cancer Father   . Prostate cancer Brother     History   Social History  . Marital Status: Widowed    Spouse Name: N/A    Number of Children: N/A  . Years of Education: N/A   Occupational History  . retired Clinical biochemist    Social History Main Topics  . Smoking status: Former Smoker -- 3.00 packs/day for 24 years    Types: Cigarettes    Quit date: 03/27/1943  . Smokeless tobacco: Never Used     Comment: Widowed x 2, has lady friend  . Alcohol Use: 0.5 oz/week    1 drink(s) per week      Comment: occassional  . Drug Use: No  . Sexual Activity: No   Other Topics Concern  . Not on file   Social History Narrative    Review of Systems:  All systems reviewed.  They are negative to the above problem except as previously stated.  Vital Signs: There were no vitals taken for this visit.  Physical Exam Patient is in NAD HEENT:  Normocephalic, atraumatic. EOMI, PERRLA. Lungs: clear to auscultation. No rales no wheezes.  Heart: Regular rate and rhythm. Normal S1, S2. No S3.   No significant murmurs. PMI not displaced.  Abdomen:  Supple, nontender. Normal bowel sounds. No masses. No hepatomegaly.  Extremities:   Good distal pulses throughout. Tr lower extremity edema.  Musculoskeletal :moving all extremities.  Neuro:   alert and oriented x3.  CN II-XII grossly intact. .  Assessment and Plan:   1  Cardiomyopathy  Volume status is minimally increased  He is comfortable  I would checvk BMET and BNP  He says he drinks  Fluids because he thinks he should  I told him to drink for thirst.  Try to cut back after dinner so that he doesn't have to get up so much at night    2.  HTN Good control   3. Hx of atrial fib.  Currently in SR   Follow     F/U athe the end of sumer   Encouraged him to keep walking  Follow up in  winter

## 2014-04-29 ENCOUNTER — Encounter: Payer: Self-pay | Admitting: Internal Medicine

## 2014-04-29 ENCOUNTER — Ambulatory Visit (INDEPENDENT_AMBULATORY_CARE_PROVIDER_SITE_OTHER): Payer: Medicare Other | Admitting: Internal Medicine

## 2014-04-29 VITALS — BP 110/68 | HR 74 | Ht 65.0 in | Wt 157.8 lb

## 2014-04-29 DIAGNOSIS — I5022 Chronic systolic (congestive) heart failure: Secondary | ICD-10-CM

## 2014-04-29 DIAGNOSIS — I428 Other cardiomyopathies: Secondary | ICD-10-CM

## 2014-04-29 DIAGNOSIS — I429 Cardiomyopathy, unspecified: Secondary | ICD-10-CM

## 2014-04-29 LAB — BASIC METABOLIC PANEL
BUN: 34 mg/dL — AB (ref 6–23)
CO2: 29 meq/L (ref 19–32)
Calcium: 9.1 mg/dL (ref 8.4–10.5)
Chloride: 103 mEq/L (ref 96–112)
Creatinine, Ser: 1.43 mg/dL (ref 0.40–1.50)
GFR: 49.32 mL/min — ABNORMAL LOW (ref >60.00)
Glucose, Bld: 98 mg/dL (ref 70–99)
Potassium: 4.3 mEq/L (ref 3.5–5.1)
SODIUM: 138 meq/L (ref 135–145)

## 2014-04-29 LAB — BRAIN NATRIURETIC PEPTIDE: Pro B Natriuretic peptide (BNP): 96 pg/mL (ref 0.0–100.0)

## 2014-04-29 NOTE — Patient Instructions (Signed)
Your physician recommends that you have lab work today: BMP/ BNP  Your physician wants you to follow-up in: August 2016 with Dr. Harrington Challenger. You will receive a reminder letter in the mail two months in advance. If you don't receive a letter, please call our office to schedule the follow-up appointment.  Your physician recommends that you continue on your current medications as directed. Please refer to the Current Medication list given to you today.

## 2014-05-12 ENCOUNTER — Encounter (INDEPENDENT_AMBULATORY_CARE_PROVIDER_SITE_OTHER): Payer: Medicare Other | Admitting: Ophthalmology

## 2014-05-12 DIAGNOSIS — H3532 Exudative age-related macular degeneration: Secondary | ICD-10-CM

## 2014-05-12 DIAGNOSIS — I10 Essential (primary) hypertension: Secondary | ICD-10-CM

## 2014-05-12 DIAGNOSIS — H43813 Vitreous degeneration, bilateral: Secondary | ICD-10-CM | POA: Diagnosis not present

## 2014-05-12 DIAGNOSIS — H35033 Hypertensive retinopathy, bilateral: Secondary | ICD-10-CM

## 2014-05-12 DIAGNOSIS — H3531 Nonexudative age-related macular degeneration: Secondary | ICD-10-CM | POA: Diagnosis not present

## 2014-06-04 ENCOUNTER — Telehealth: Payer: Self-pay | Admitting: Internal Medicine

## 2014-06-04 NOTE — Telephone Encounter (Signed)
Can try metoclopramide 5 mg once to twice daily for 5 days to see if this breaks the hiccups cycle This should NOT be a long term med

## 2014-06-04 NOTE — Telephone Encounter (Signed)
Left message for pt to call back  °

## 2014-06-04 NOTE — Telephone Encounter (Signed)
Pt had an EGD done in December. States that since he had the EGD he has had hiccups almost everyday. Pt states the hiccups have even happened at 3am and woke him up. Pt wants to know if there is anything that can be done to help with the hiccups. Please advise.

## 2014-06-07 MED ORDER — METOCLOPRAMIDE HCL 5 MG PO TABS: 5.0000 mg | ORAL_TABLET | Freq: Two times a day (BID) | ORAL | Status: DC

## 2014-06-07 NOTE — Telephone Encounter (Signed)
Spoke with pt and he is aware. Script sent to pharmacy. 

## 2014-06-28 ENCOUNTER — Encounter: Payer: Self-pay | Admitting: Internal Medicine

## 2014-06-28 ENCOUNTER — Telehealth: Payer: Self-pay | Admitting: Internal Medicine

## 2014-06-28 ENCOUNTER — Ambulatory Visit (INDEPENDENT_AMBULATORY_CARE_PROVIDER_SITE_OTHER): Payer: Medicare Other | Admitting: Internal Medicine

## 2014-06-28 ENCOUNTER — Other Ambulatory Visit: Payer: Medicare Other

## 2014-06-28 VITALS — BP 110/72 | HR 73 | Ht 65.0 in | Wt 160.0 lb

## 2014-06-28 DIAGNOSIS — I5042 Chronic combined systolic (congestive) and diastolic (congestive) heart failure: Secondary | ICD-10-CM

## 2014-06-28 DIAGNOSIS — E785 Hyperlipidemia, unspecified: Secondary | ICD-10-CM | POA: Diagnosis not present

## 2014-06-28 DIAGNOSIS — I480 Paroxysmal atrial fibrillation: Secondary | ICD-10-CM

## 2014-06-28 LAB — CBC
HCT: 40.3 % (ref 39.0–52.0)
HEMOGLOBIN: 13.5 g/dL (ref 13.0–17.0)
MCHC: 33.6 g/dL (ref 30.0–36.0)
MCV: 88.2 fl (ref 78.0–100.0)
PLATELETS: 234 10*3/uL (ref 150.0–400.0)
RBC: 4.57 Mil/uL (ref 4.22–5.81)
RDW: 14.4 % (ref 11.5–15.5)
WBC: 8.3 10*3/uL (ref 4.0–10.5)

## 2014-06-28 LAB — BASIC METABOLIC PANEL
BUN: 24 mg/dL — AB (ref 6–23)
CALCIUM: 9.3 mg/dL (ref 8.4–10.5)
CO2: 27 mEq/L (ref 19–32)
CREATININE: 1.33 mg/dL (ref 0.40–1.50)
Chloride: 104 mEq/L (ref 96–112)
GFR: 53.6 mL/min — AB (ref >60.00)
Glucose, Bld: 83 mg/dL (ref 70–99)
Potassium: 4.3 mEq/L (ref 3.5–5.1)
Sodium: 136 mEq/L (ref 135–145)

## 2014-06-28 LAB — BRAIN NATRIURETIC PEPTIDE: Pro B Natriuretic peptide (BNP): 62 pg/mL (ref 0.0–100.0)

## 2014-06-28 MED ORDER — SIMVASTATIN 20 MG PO TABS: 20.0000 mg | ORAL_TABLET | Freq: Every day | ORAL | Status: DC

## 2014-06-28 MED ORDER — METOPROLOL SUCCINATE ER 25 MG PO TB24: 25.0000 mg | ORAL_TABLET | Freq: Every day | ORAL | Status: DC

## 2014-06-28 MED ORDER — FUROSEMIDE 20 MG PO TABS: 20.0000 mg | ORAL_TABLET | Freq: Every day | ORAL | Status: DC

## 2014-06-28 NOTE — Telephone Encounter (Signed)
New message     Pt has an appt today at 1:45.  Can he come in earlier to do blood work?  OK to leave msg on vm

## 2014-06-28 NOTE — Telephone Encounter (Signed)
Per Dr. Harrington Challenger patient should have BMET, BNP, CBC drawn. Called patient to inform.  He will come at 1:45 pm

## 2014-06-28 NOTE — Patient Instructions (Signed)
Your physician recommends that you continue on your current medications as directed. Please refer to the Current Medication list given to you today. Your physician wants you to follow-up in: October 2016 with Dr. Harrington Challenger.  You will receive a reminder letter in the mail two months in advance. If you don't receive a letter, please call our office to schedule the follow-up appointment.

## 2014-06-28 NOTE — Progress Notes (Signed)
Cardiology Office Note   Date:  06/28/2014   ID:  [name], DOB 10/13/1921, MRN [id number]  PCP:  Kennon Portela, MD  Cardiologist:  Dorris Carnes, MD   No chief complaint on file.     History of Present Illness: [name] is a 81 y.o. male who has history of presumed NICM (normal myoview in past), HTN and HL I saw him earlier this spring   At that time he appeared to be mildly volume overloaded  I recomm intermitt lasix.    Since seen he says he hs been feeling good  Not sure why here  I told him He says he takes lasix every few days  Doesn't like to have to go to the bathroom all the time Says ankles are better  No CP  Breathing is OK  No signif dizziness       Past Medical History  Diagnosis Date  . BARRETTS ESOPHAGUS 11/19/2007  . CARDIOMYOPATHY 08/02/2008  . CHANGE IN BOWELS 02/01/2009  . CHF 08/10/2008  . Diarrhea 01/31/2009  . DIVERTICULOSIS, COLON 11/19/2007  . GASTRITIS 11/19/2007  . GERD 11/19/2007  . HIATAL HERNIA 11/19/2007  . HYPERLIPIDEMIA 06/27/2009  . HYPOTHYROIDISM 06/27/2009  . MELENA 08/01/2009  . PERSONAL HX COLONIC POLYPS 02/01/2009  . TINEA CORPORIS 06/27/2009  . TOBACCO USE, QUIT 06/27/2009  . TREMOR, ESSENTIAL 06/27/2009  . HTN (hypertension)   . IBS (irritable bowel syndrome)   . Heart murmur   . Blood transfusion without reported diagnosis     Past Surgical History  Procedure Laterality Date  . Cholecystectomy    . Lung surgery  1984  . Umbilical hernia repair    . Tonsillectomy    . Vasectomy    . Esophagogastroduodenoscopy N/A 04/28/2013    Procedure: ESOPHAGOGASTRODUODENOSCOPY (EGD);  Surgeon: Jerene Bears, MD;  Location: [facility];  Service: Gastroenterology;  Laterality: N/A;  . Tonsillectomy    . Hernia repair    . Hip arthroplasty Left 11/30/2013    Procedure: ARTHROPLASTY BIPOLAR HIP;  Surgeon: [name], MD;  Location: [facility];  Service: Orthopedics;  Laterality: Left;     Current Outpatient Prescriptions  Medication  Sig Dispense Refill  . Besifloxacin HCl 0.6 % SUSP Apply 1 drop to eye as directed.    Marland Kitchen esomeprazole (NEXIUM) 40 MG capsule Take 1 capsule (40 mg total) by mouth daily at 12 noon. 30 capsule 6  . feeding supplement, ENSURE COMPLETE, (ENSURE COMPLETE) LIQD Take 237 mLs by mouth at bedtime. 30 Bottle 1  . furosemide (LASIX) 20 MG tablet Take 1-2 tablets (20-40 mg total) by mouth daily. Take 20mg  one day then alternate with 40mg  the next day 30 tablet 0  . ketorolac (ACULAR) 0.4 % SOLN Place 1 drop into the left eye as directed.    Marland Kitchen lisinopril-hydrochlorothiazide (PRINZIDE,ZESTORETIC) 10-12.5 MG per tablet     . lisinopril-hydrochlorothiazide (PRINZIDE,ZESTORETIC) 10-12.5 MG per tablet Take 1 tablet by mouth  daily with breakfast 90 tablet 0  . metoCLOPramide (REGLAN) 5 MG tablet Take 1 tablet (5 mg total) by mouth 2 (two) times daily. 10 tablet 0  . metoprolol succinate (TOPROL-XL) 25 MG 24 hr tablet Take 1 tablet (25 mg total) by mouth daily with breakfast. 30 tablet 0  . Multiple Vitamins-Minerals (VITEYES AREDS ADVANCED) CAPS Take 1 capsule by mouth 2 (two) times daily.    Marland Kitchen OVER THE COUNTER MEDICATION Take 1 tablet by mouth daily. Over the counter probiotic (not align)    .  simvastatin (ZOCOR) 20 MG tablet Take 1 tablet (20 mg total) by mouth daily. 90 tablet 3  . Tamsulosin HCl (FLOMAX) 0.4 MG CAPS Take 0.4 mg by mouth daily.      No current facility-administered medications for this visit.    Allergies:   Ciprofloxacin and Penicillins    Social History:  The patient  reports that he quit smoking about 71 years ago. His smoking use included Cigarettes. He has a 72 pack-year smoking history. He has never used smokeless tobacco. He reports that he drinks about 0.5 oz of alcohol per week. He reports that he does not use illicit drugs.   Family History:  The patient's family history includes Colon cancer in his father; Heart disease in his mother; Prostate cancer in his brother.    ROS:   Please see the history of present illness.   All other systems are reviewed and negative.    PHYSICAL EXAM: VS:  Ht 5\' 5"  (1.651 m) , BMI There is no weight on file to calculate BMI. GEN: Well nourished, well developed, in no acute distress HEENT: normal Neck: no JVD, carotid bruits, or masses Cardiac: RRR; no murmurs, rubs, or gallops,no edema  Respiratory:  clear to auscultation bilaterally, normal work of breathing GI: soft, nontender, nondistended, + BS MS: no deformity or atrophy Skin: warm and dry, no rash Neuro:  Strength and sensation are intact    EKG: None  Recent Labs: 07/11/2013: Magnesium 1.6 12/03/2013: TSH 2.000 12/04/2013: Platelets 300 02/10/2014: ALT 10; Hemoglobin 12.7* 04/29/2014: BUN 34*; Creatinine 1.43; Potassium 4.3; Pro B Natriuretic peptide (BNP) 96.0; Sodium 138    Lipid Panel    Component Value Date/Time   CHOL 127 02/10/2014 1220   TRIG 150* 02/10/2014 1220   TRIG 95 05/03/2009   HDL 31* 02/10/2014 1220   CHOLHDL 4.1 02/10/2014 1220   VLDL 30 02/10/2014 1220   LDLCALC 66 02/10/2014 1220      Wt Readings from Last 3 Encounters:  04/29/14 157 lb 12.8 oz (71.578 kg)  04/02/14 153 lb (69.4 kg)  03/15/14 154 lb (69.854 kg)       ASSESSMENT AND PLAN:  1.  Chronic systolic CHF  Volume status appears good  I would keep on same regimen  Will check labs today .  2.   Current medicines are reviewed at length with the patient today.    The following changes have been made: No changes     Disposition:   FU with me later this year     Signed, Dorris Carnes, MD  06/28/2014 1:59 PM    [facility] [address], [place], [place]  [address] Phone: [phone]; Fax: [phone]

## 2014-07-08 ENCOUNTER — Encounter (INDEPENDENT_AMBULATORY_CARE_PROVIDER_SITE_OTHER): Payer: Medicare Other | Admitting: Ophthalmology

## 2014-07-08 DIAGNOSIS — H43813 Vitreous degeneration, bilateral: Secondary | ICD-10-CM

## 2014-07-08 DIAGNOSIS — H3532 Exudative age-related macular degeneration: Secondary | ICD-10-CM | POA: Diagnosis not present

## 2014-07-08 DIAGNOSIS — H3531 Nonexudative age-related macular degeneration: Secondary | ICD-10-CM | POA: Diagnosis not present

## 2014-07-08 DIAGNOSIS — H35033 Hypertensive retinopathy, bilateral: Secondary | ICD-10-CM | POA: Diagnosis not present

## 2014-07-08 DIAGNOSIS — I10 Essential (primary) hypertension: Secondary | ICD-10-CM

## 2014-07-08 DIAGNOSIS — Z961 Presence of intraocular lens: Secondary | ICD-10-CM | POA: Diagnosis not present

## 2014-07-09 DIAGNOSIS — Z471 Aftercare following joint replacement surgery: Secondary | ICD-10-CM | POA: Diagnosis not present

## 2014-07-09 DIAGNOSIS — Z96642 Presence of left artificial hip joint: Secondary | ICD-10-CM | POA: Diagnosis not present

## 2014-08-06 ENCOUNTER — Ambulatory Visit (INDEPENDENT_AMBULATORY_CARE_PROVIDER_SITE_OTHER): Payer: Medicare Other | Admitting: Emergency Medicine

## 2014-08-06 VITALS — BP 106/56 | HR 102 | Temp 97.7°F | Resp 18 | Ht 65.0 in | Wt 158.0 lb

## 2014-08-06 DIAGNOSIS — J029 Acute pharyngitis, unspecified: Secondary | ICD-10-CM

## 2014-08-06 DIAGNOSIS — D72829 Elevated white blood cell count, unspecified: Secondary | ICD-10-CM

## 2014-08-06 LAB — POCT CBC
GRANULOCYTE PERCENT: 81.8 % — AB (ref 37–80)
HCT, POC: 41.3 % — AB (ref 43.5–53.7)
Hemoglobin: 13.7 g/dL — AB (ref 14.1–18.1)
Lymph, poc: 1.6 (ref 0.6–3.4)
MCH, POC: 29.5 pg (ref 27–31.2)
MCHC: 33.2 g/dL (ref 31.8–35.4)
MCV: 88.9 fL (ref 80–97)
MID (cbc): 1.1 — AB (ref 0–0.9)
MPV: 8.4 fL (ref 0–99.8)
PLATELET COUNT, POC: 267 10*3/uL (ref 142–424)
POC GRANULOCYTE: 12.2 — AB (ref 2–6.9)
POC LYMPH PERCENT: 10.8 %L (ref 10–50)
POC MID %: 7.4 % (ref 0–12)
RBC: 4.65 M/uL — AB (ref 4.69–6.13)
RDW, POC: 14.3 %
WBC: 14.9 10*3/uL — AB (ref 4.6–10.2)

## 2014-08-06 LAB — POCT RAPID STREP A (OFFICE): Rapid Strep A Screen: NEGATIVE

## 2014-08-06 MED ORDER — AZITHROMYCIN 250 MG PO TABS: ORAL_TABLET | ORAL | Status: DC

## 2014-08-06 MED ORDER — FIRST-DUKES MOUTHWASH MT SUSP: OROMUCOSAL | Status: DC

## 2014-08-06 NOTE — Patient Instructions (Signed)
   Please return to clinic Sunday for recheck.  Sore Throat A sore throat is pain, burning, irritation, or scratchiness of the throat. There is often pain or tenderness when swallowing or talking. A sore throat may be accompanied by other symptoms, such as coughing, sneezing, fever, and swollen neck glands. A sore throat is often the first sign of another sickness, such as a cold, flu, strep throat, or mononucleosis (commonly known as mono). Most sore throats go away without medical treatment. CAUSES  The most common causes of a sore throat include:  A viral infection, such as a cold, flu, or mono.  A bacterial infection, such as strep throat, tonsillitis, or whooping cough.  Seasonal allergies.  Dryness in the air.  Irritants, such as smoke or pollution.  Gastroesophageal reflux disease (GERD). HOME CARE INSTRUCTIONS   Only take over-the-counter medicines as directed by your caregiver.  Drink enough fluids to keep your urine clear or pale yellow.  Rest as needed.  Try using throat sprays, lozenges, or sucking on hard candy to ease any pain (if older than 4 years or as directed).  Sip warm liquids, such as broth, herbal tea, or warm water with honey to relieve pain temporarily. You may also eat or drink cold or frozen liquids such as frozen ice pops.  Gargle with salt water (mix 1 tsp salt with 8 oz of water).  Do not smoke and avoid secondhand smoke.  Put a cool-mist humidifier in your bedroom at night to moisten the air. You can also turn on a hot shower and sit in the bathroom with the door closed for 5-10 minutes. SEEK IMMEDIATE MEDICAL CARE IF:  You have difficulty breathing.  You are unable to swallow fluids, soft foods, or your saliva.  You have increased swelling in the throat.  Your sore throat does not get better in 7 days.  You have nausea and vomiting.  You have a fever or persistent symptoms for more than 2-3 days.  You have a fever and your symptoms  suddenly get worse. MAKE SURE YOU:   Understand these instructions.  Will watch your condition.  Will get help right away if you are not doing well or get worse. Document Released: 04/19/2004 Document Revised: 02/27/2012 Document Reviewed: 11/18/2011 [facility] Patient Information 2015 [place], [place]. This information is not intended to replace advice given to you by your health care provider. Make sure you discuss any questions you have with your health care provider.

## 2014-08-06 NOTE — Progress Notes (Addendum)
Subjective:    Patient ID: [name], male    DOB: 04-15-1921, 81 y.o.   MRN: [id number] This chart was scribed for Arlyss Queen, MD by Marti Sleigh, Medical Scribe. This patient was seen in Room 2 and the patient's care was started at 1:56 PM.  Chief Complaint  Patient presents with  . Sore Throat    x 1 week    HPI HPI Comments: [name] is a 81 y.o. male who presents to [facility] complaining of sore throat with associated mild intermittent cough for the last four days. Pt states normally when he gets a sore throat he gargles salt water with relief of his sx, but this time his sx have not resolved after four days which is why he reported to [facility]. Pt denies fever.     Review of Systems  Constitutional: Negative for fever and chills.  HENT: Positive for sneezing and sore throat. Negative for mouth sores.   Respiratory: Positive for cough (Mild). Negative for wheezing.    He has had no hoarseness or difficulty with his voice.     Objective:   Physical Exam  Constitutional: He is oriented to person, place, and time. He appears well-developed and well-nourished. No distress.  HENT:  Head: Normocephalic and atraumatic.  Right Ear: Tympanic membrane and ear canal normal.  Left Ear: Tympanic membrane and ear canal normal.  Nose: Nose normal.  Mouth/Throat: Oropharynx is clear and moist.  Hearing aid in left ear with scarring of the drum. Tonsils removed. Mild redness of the posterior pharynx.  Eyes: Pupils are equal, round, and reactive to light.  Neck: Neck supple.  Some tendernes in the anterior cervical area, but no adenopathy.  Cardiovascular: Normal rate.   Pulmonary/Chest: Effort normal. No respiratory distress.  Musculoskeletal: Normal range of motion.  Neurological: He is alert and oriented to person, place, and time. Coordination normal.  Skin: Skin is warm and dry. He is not diaphoretic.  Psychiatric: He has a normal mood and affect. His behavior is normal.    Nursing note and vitals reviewed.  Results for orders placed or performed in visit on 08/06/14  POCT CBC  Result Value Ref Range   WBC 14.9 (A) 4.6 - 10.2 K/uL   Lymph, poc 1.6 0.6 - 3.4   POC LYMPH PERCENT 10.8 10 - 50 %L   MID (cbc) 1.1 (A) 0 - 0.9   POC MID % 7.4 0 - 12 %M   POC Granulocyte 12.2 (A) 2 - 6.9   Granulocyte percent 81.8 (A) 37 - 80 %G   RBC 4.65 (A) 4.69 - 6.13 M/uL   Hemoglobin 13.7 (A) 14.1 - 18.1 g/dL   HCT, POC 41.3 (A) 43.5 - 53.7 %   MCV 88.9 80 - 97 fL   MCH, POC 29.5 27 - 31.2 pg   MCHC 33.2 31.8 - 35.4 g/dL   RDW, POC 14.3 %   Platelet Count, POC 267 142 - 424 K/uL   MPV 8.4 0 - 99.8 fL  POCT rapid strep A  Result Value Ref Range   Rapid Strep A Screen Negative Negative   Meds ordered this encounter  Medications  . Diphenhyd-Hydrocort-Nystatin (FIRST-DUKES MOUTHWASH) SUSP    Sig: 1 teaspoon as rinse gargle and spit 4 times a day    Dispense:  120 mL    Refill:  1  . azithromycin (ZITHROMAX) 250 MG tablet    Sig: Take 2 today and 1 a day for 4 days  Dispense:  6 each    Refill:  0      Assessment & Plan:  We'll place on gargles along with a Z-Pak recheck in 48 hours I was afraid not to have him on antibiotics because of his age and elevated white count.I personally performed the services described in this documentation, which was scribed in my presence. The recorded information has been reviewed and is accurate.  Nena Jordan, MD

## 2014-08-08 ENCOUNTER — Ambulatory Visit (INDEPENDENT_AMBULATORY_CARE_PROVIDER_SITE_OTHER): Payer: Medicare Other | Admitting: Emergency Medicine

## 2014-08-08 VITALS — BP 108/61 | HR 71 | Temp 97.7°F | Resp 16 | Ht 65.0 in | Wt 157.1 lb

## 2014-08-08 DIAGNOSIS — D72829 Elevated white blood cell count, unspecified: Secondary | ICD-10-CM

## 2014-08-08 DIAGNOSIS — J029 Acute pharyngitis, unspecified: Secondary | ICD-10-CM

## 2014-08-08 LAB — POCT CBC
Granulocyte percent: 77.7 %G (ref 37–80)
HCT, POC: 39.9 % — AB (ref 43.5–53.7)
Hemoglobin: 12.9 g/dL — AB (ref 14.1–18.1)
Lymph, poc: 1.8 (ref 0.6–3.4)
MCH, POC: 28.7 pg (ref 27–31.2)
MCHC: 32.3 g/dL (ref 31.8–35.4)
MCV: 89 fL (ref 80–97)
MID (cbc): 0.4 (ref 0–0.9)
MPV: 8.1 fL (ref 0–99.8)
POC GRANULOCYTE: 7.9 — AB (ref 2–6.9)
POC LYMPH PERCENT: 18 %L (ref 10–50)
POC MID %: 4.3 % (ref 0–12)
Platelet Count, POC: 258 10*3/uL (ref 142–424)
RBC: 4.48 M/uL — AB (ref 4.69–6.13)
RDW, POC: 14.4 %
WBC: 10.2 10*3/uL (ref 4.6–10.2)

## 2014-08-08 LAB — CULTURE, GROUP A STREP: Organism ID, Bacteria: NORMAL

## 2014-08-08 NOTE — Progress Notes (Addendum)
   Subjective:  This chart was scribed for Arlyss Queen, MD by Moises Blood, Medical Scribe. This patient was seen in Room 11 and the patient's care was started 2:57 PM.    Patient ID: [name], male    DOB: 08-24-1921, 81 y.o.   MRN: [id number]  HPI [name] is a 81 y.o. male who presents to [facility] for a follow up on gradual onset sore throat that started 6 days ago. Symptoms have not resolved. He believes he became sick due to taking out his upper dentures with his hand and putting them into his pocket; it may have contracted some germs. He notes planning to see Zenovia Jarred, MD for another endoscopy because he has some GI issues.     Review of Systems  HENT: Positive for sore throat.        Objective:   Physical Exam CONSTITUTIONAL: Well developed/Wel nourished HEAD: Normocephalic/atraumatic EYES: EOMI/PERRL ENMT: Mucous membranes moist; throat slightly red NECK: supple no meningeal signs SPINE/BACK: entire spine nontender CV: S1/S2 noted, no murmurs/rubs/gallops noted LUNGS: Lungs are clear to auscultation bilaterally, no apparent distress; chest sounds symmetrical ABDOMEN: soft, non tender, no rebound or guarding, bowel sounds noted throughout abdomen GU: no cva tenderness NEURO: Pt is awake/alert/appropriate, moves all extremities x4. No facial droop. EXTREMITIES: pulses normal/equal, full ROM SKIN: warm, color normal PSYCH: no abnormalities of mood noted, alert, and oriented to situation Results for orders placed or performed in visit on 08/08/14  POCT CBC  Result Value Ref Range   WBC 10.2 4.6 - 10.2 K/uL   Lymph, poc 1.8 0.6 - 3.4   POC LYMPH PERCENT 18.0 10 - 50 %L   MID (cbc) 0.4 0 - 0.9   POC MID % 4.3 0 - 12 %M   POC Granulocyte 7.9 (A) 2 - 6.9   Granulocyte percent 77.7 37 - 80 %G   RBC 4.48 (A) 4.69 - 6.13 M/uL   Hemoglobin 12.9 (A) 14.1 - 18.1 g/dL   HCT, POC 39.9 (A) 43.5 - 53.7 %   MCV 89.0 80 - 97 fL   MCH, POC 28.7 27 - 31.2 pg   MCHC 32.3  31.8 - 35.4 g/dL   RDW, POC 14.4 %   Platelet Count, POC 258 142 - 424 K/uL   MPV 8.1 0 - 99.8 fL         Assessment & Plan:  His throat is better. His white count is better. Finish out the [employer]. Recheck if any problems.I personally performed the services described in this documentation, which was scribed in my presence. The recorded information has been reviewed and is accurate.  Nena Jordan, MD

## 2014-08-09 ENCOUNTER — Telehealth: Payer: Self-pay | Admitting: Internal Medicine

## 2014-08-09 NOTE — Telephone Encounter (Signed)
Pt states he would like a refill on the Reglan 5mg  tablets 1-2 daily for hiccups but would like more than just the amount for 5 days. Dr. Hilarie Fredrickson is it ok to refill this and give more quantity?

## 2014-08-10 ENCOUNTER — Other Ambulatory Visit: Payer: Self-pay

## 2014-08-10 MED ORDER — METOCLOPRAMIDE HCL 5 MG PO TABS: ORAL_TABLET | ORAL | Status: DC

## 2014-08-10 NOTE — Telephone Encounter (Signed)
I assume it was helping given he is wanting to refill it Okay for reglan 5 mg once to twice daily, #60 per month with 2 refills He should make [place] aware of any issues/side-effects, etc. OV for followup within 3-4 months

## 2014-08-10 NOTE — Telephone Encounter (Signed)
Script sent to pharmacy and recall entered for OV. Pt aware.

## 2014-08-24 ENCOUNTER — Other Ambulatory Visit: Payer: Self-pay | Admitting: Internal Medicine

## 2014-08-26 ENCOUNTER — Other Ambulatory Visit: Payer: Self-pay

## 2014-08-26 MED ORDER — ESOMEPRAZOLE MAGNESIUM 40 MG PO CPDR: DELAYED_RELEASE_CAPSULE | ORAL | Status: DC

## 2014-09-02 ENCOUNTER — Encounter (INDEPENDENT_AMBULATORY_CARE_PROVIDER_SITE_OTHER): Payer: Medicare Other | Admitting: Ophthalmology

## 2014-09-02 DIAGNOSIS — H3532 Exudative age-related macular degeneration: Secondary | ICD-10-CM

## 2014-09-02 DIAGNOSIS — H35033 Hypertensive retinopathy, bilateral: Secondary | ICD-10-CM

## 2014-09-02 DIAGNOSIS — H43813 Vitreous degeneration, bilateral: Secondary | ICD-10-CM

## 2014-09-02 DIAGNOSIS — H3531 Nonexudative age-related macular degeneration: Secondary | ICD-10-CM

## 2014-09-02 DIAGNOSIS — I10 Essential (primary) hypertension: Secondary | ICD-10-CM

## 2014-09-13 DIAGNOSIS — H3531 Nonexudative age-related macular degeneration: Secondary | ICD-10-CM | POA: Diagnosis not present

## 2014-09-14 ENCOUNTER — Telehealth: Payer: Self-pay

## 2014-09-14 NOTE — Telephone Encounter (Signed)
Pt walked in c/o hiccups and fecal incontinence. Pt was given reglan previously for the hiccups but states this is not working anymore. Pt wants to see Dr. Hilarie Fredrickson. Pt scheduled to see Dr. Hilarie Fredrickson 09/20/14@4pm . Pt aware of appt.

## 2014-09-17 ENCOUNTER — Other Ambulatory Visit: Payer: Self-pay | Admitting: Internal Medicine

## 2014-09-20 ENCOUNTER — Encounter: Payer: Self-pay | Admitting: Internal Medicine

## 2014-09-20 ENCOUNTER — Ambulatory Visit (INDEPENDENT_AMBULATORY_CARE_PROVIDER_SITE_OTHER): Payer: Medicare Other | Admitting: Internal Medicine

## 2014-09-20 VITALS — BP 124/60 | HR 68 | Ht 65.0 in | Wt 158.2 lb

## 2014-09-20 DIAGNOSIS — K2270 Barrett's esophagus without dysplasia: Secondary | ICD-10-CM

## 2014-09-20 DIAGNOSIS — R195 Other fecal abnormalities: Secondary | ICD-10-CM

## 2014-09-20 DIAGNOSIS — R066 Hiccough: Secondary | ICD-10-CM

## 2014-09-20 DIAGNOSIS — K449 Diaphragmatic hernia without obstruction or gangrene: Secondary | ICD-10-CM | POA: Diagnosis not present

## 2014-09-20 DIAGNOSIS — K219 Gastro-esophageal reflux disease without esophagitis: Secondary | ICD-10-CM

## 2014-09-20 MED ORDER — BACLOFEN 10 MG PO TABS: 5.0000 mg | ORAL_TABLET | Freq: Three times a day (TID) | ORAL | Status: DC

## 2014-09-20 NOTE — Progress Notes (Signed)
Subjective:    Patient ID: [name], male    DOB: [birth year], 81 y.o.   MRN: [id number]  HPI  [name] is a 81 year old male with a past medical history of Barrett's esophagus with history of low-grade dysplasia most recently no dysplasia on surveillance, GERD, 4 cm hiatal hernia, history of chronic loose stools, history of diverticulosis, history of colon polyps, IBS who seen in follow-up.  He also has a history of A. fib, CHF with EF 45%, hypertension, hypothyroidism and hyperlipidemia. He is here alone today.  Last endoscopy performed 03/15/2014 to evaluate low-grade dysplasia in the setting of Barrett's esophagus seen in February 2015. This revealed unchanged endoscopic 5 cm segment of Barrett's esophagus. Four-quadrant biopsies. 4 cm hiatal hernia. Normal mucosa in the stomach. Diverticulum found in the duodenum at the level of the ampulla.  Today he returns to discuss Barrett's esophagus but more importantly hiccups. He is having hiccups multiple times on a near daily basis. They never last longer than a few minutes to almost an hour but they seem to come and go. He denies heartburn. He is taking Nexium 40 mg daily. We tried metoclopramide 5 mg 2-3 times a day. This did not benefit take up soon anyway. He denies abdominal pain. Denies dysphagia or odynophagia. Reports good appetite. No weight loss. Bowel movements have been loose for many years but he is taking a soluble fiber powder which he buys from [employer]. He is dissolving this and taking it with water 3-4 times daily. With this bowel movements are formed but soft. He denies rectal bleeding or melena.  Review of Systems As per history of present illness, otherwise negative  Current Medications, Allergies, Past Medical History, Past Surgical History, Family History and Social History were reviewed in [employer] record.     Objective:   Physical Exam BP 124/60 mmHg  Pulse 68  Ht 5\' 5"  (1.651 m)   Wt 158 lb 4 oz (71.782 kg)  BMI 26.33 kg/m2 Constitutional: Well-developed and well-nourished. No distress. HEENT: Normocephalic and atraumatic. Oropharynx is clear and moist. No oropharyngeal exudate. Conjunctivae are normal.  No scleral icterus. Neck: Neck supple. Trachea midline. Cardiovascular: Normal rate, regular rhythm and intact distal pulses.  Pulmonary/chest: Effort normal and breath sounds normal. No wheezing, rales or rhonchi. Abdominal: Soft, nontender, nondistended. Bowel sounds active throughout.  Extremities: no clubbing, cyanosis, or edema Lymphadenopathy: No cervical adenopathy noted. Neurological: Alert and oriented to person place and time. Skin: Skin is warm and dry. No rashes noted. Psychiatric: Normal mood and affect. Behavior is normal.  CBC    Component Value Date/Time   WBC 10.2 08/08/2014 1523   WBC 8.3 06/28/2014 1349   RBC 4.48* 08/08/2014 1523   RBC 4.57 06/28/2014 1349   HGB 12.9* 08/08/2014 1523   HGB 13.5 06/28/2014 1349   HCT 39.9* 08/08/2014 1523   HCT 40.3 06/28/2014 1349   PLT 234.0 06/28/2014 1349   MCV 89.0 08/08/2014 1523   MCV 88.2 06/28/2014 1349   MCH 28.7 08/08/2014 1523   MCH 29.3 12/04/2013 0433   MCHC 32.3 08/08/2014 1523   MCHC 33.6 06/28/2014 1349   RDW 14.4 06/28/2014 1349   LYMPHSABS 1.4 10/26/2013 1331   MONOABS 0.7 10/26/2013 1331   EOSABS 0.2 10/26/2013 1331   BASOSABS 0.0 10/26/2013 1331    CMP     Component Value Date/Time   NA 136 06/28/2014 1349   K 4.3 06/28/2014 1349   CL 104 06/28/2014 1349  CO2 27 06/28/2014 1349   GLUCOSE 83 06/28/2014 1349   BUN 24* 06/28/2014 1349   CREATININE 1.33 06/28/2014 1349   CREATININE 1.45* 04/02/2014 1352   CALCIUM 9.3 06/28/2014 1349   PROT 6.3 02/10/2014 1220   ALBUMIN 3.7 02/10/2014 1220   AST 9 02/10/2014 1220   ALT 10 02/10/2014 1220   ALKPHOS 96 02/10/2014 1220   BILITOT 0.5 02/10/2014 1220   GFRNONAA 45* 03/06/2014 0927   GFRNONAA 73* 12/04/2013 0433   GFRAA  53* 03/06/2014 0927   GFRAA 85* 12/04/2013 0433    Prior celiac panel - neg Gastrin elevated Fecal elastase normal VCE 2011 - possible angiodysplasias, small bowel submucosal nodule without ulcer, nuclear medicine octreotide scan tumor localization study to eval carcinoid, etc was negative in 2011.      Assessment & Plan:  81 year old male with a past medical history of Barrett's esophagus with history of low-grade dysplasia most recently no dysplasia on surveillance, GERD, 4 cm hiatal hernia, history of chronic loose stools, history of diverticulosis, history of colon polyps, IBS who seen in follow-up.   1. GERD/hiccups/Barrett's with hx of dysplasia -- hiccups could be secondary to uncontrolled GERD and/or hiatal hernia. This is bothersome to him. No response to metoclopramide. Discontinue metoclopramide. Chlorpromazine was considerably given age the decision was made not to prescribe this medication. We'll try baclofen 5 mg 3 times a day. We discussed that this medication can cause sleepiness and that he should not drive for several days when starting this medication. If it doesn't cause sleepiness he can perform regular activities. Continue Nexium 40 mg daily. GERD diet encouraged --Regarding Barrett's esophagus, no dysplasia at last surveillance in December 2015. Given the low-grade dysplasia seen in February 2015, my recommendation was to repeat upper endoscopy, assuming he continues to do well clinically, in December 2016. He is very much interested in continued surveillance. Will plan repeat EGD December 2016 for Barrett's biopsies  2. Loose stools -- chronic and improved and well controlled with over-the-counter fiber supplement. Continue fiber supplement.  Return in 8-12 weeks, sooner if necessary 25 minutes spent with the patient today

## 2014-09-20 NOTE — Patient Instructions (Signed)
Continue taking nexium and fiber for loose stool.  We are planning an endoscopy for December.  Stop taking reglan.  We have sent medications to your pharmacy for you to pick up at your convenience.

## 2014-09-30 ENCOUNTER — Encounter: Payer: Self-pay | Admitting: Internal Medicine

## 2014-10-21 ENCOUNTER — Encounter (INDEPENDENT_AMBULATORY_CARE_PROVIDER_SITE_OTHER): Payer: Medicare Other | Admitting: Ophthalmology

## 2014-10-21 DIAGNOSIS — I10 Essential (primary) hypertension: Secondary | ICD-10-CM

## 2014-10-21 DIAGNOSIS — H3532 Exudative age-related macular degeneration: Secondary | ICD-10-CM | POA: Diagnosis not present

## 2014-10-21 DIAGNOSIS — H35033 Hypertensive retinopathy, bilateral: Secondary | ICD-10-CM

## 2014-10-21 DIAGNOSIS — H3531 Nonexudative age-related macular degeneration: Secondary | ICD-10-CM | POA: Diagnosis not present

## 2014-10-21 DIAGNOSIS — H43813 Vitreous degeneration, bilateral: Secondary | ICD-10-CM

## 2014-12-03 ENCOUNTER — Other Ambulatory Visit: Payer: Self-pay | Admitting: *Deleted

## 2014-12-03 MED ORDER — ESOMEPRAZOLE MAGNESIUM 40 MG PO CPDR: DELAYED_RELEASE_CAPSULE | ORAL | Status: DC

## 2014-12-09 ENCOUNTER — Encounter (INDEPENDENT_AMBULATORY_CARE_PROVIDER_SITE_OTHER): Payer: Medicare Other | Admitting: Ophthalmology

## 2014-12-09 DIAGNOSIS — I10 Essential (primary) hypertension: Secondary | ICD-10-CM | POA: Diagnosis not present

## 2014-12-09 DIAGNOSIS — H3532 Exudative age-related macular degeneration: Secondary | ICD-10-CM

## 2014-12-09 DIAGNOSIS — H3531 Nonexudative age-related macular degeneration: Secondary | ICD-10-CM | POA: Diagnosis not present

## 2014-12-09 DIAGNOSIS — H35033 Hypertensive retinopathy, bilateral: Secondary | ICD-10-CM

## 2014-12-09 DIAGNOSIS — H43813 Vitreous degeneration, bilateral: Secondary | ICD-10-CM

## 2014-12-10 ENCOUNTER — Encounter: Payer: Self-pay | Admitting: Internal Medicine

## 2014-12-10 ENCOUNTER — Ambulatory Visit (INDEPENDENT_AMBULATORY_CARE_PROVIDER_SITE_OTHER): Payer: Medicare Other | Admitting: Internal Medicine

## 2014-12-10 ENCOUNTER — Ambulatory Visit: Payer: Medicare Other | Admitting: Internal Medicine

## 2014-12-10 VITALS — BP 110/58 | HR 80 | Ht 64.0 in | Wt 158.5 lb

## 2014-12-10 DIAGNOSIS — K219 Gastro-esophageal reflux disease without esophagitis: Secondary | ICD-10-CM | POA: Diagnosis not present

## 2014-12-10 DIAGNOSIS — R066 Hiccough: Secondary | ICD-10-CM | POA: Diagnosis not present

## 2014-12-10 DIAGNOSIS — K2270 Barrett's esophagus without dysplasia: Secondary | ICD-10-CM

## 2014-12-10 MED ORDER — BACLOFEN 10 MG PO TABS: 5.0000 mg | ORAL_TABLET | Freq: Three times a day (TID) | ORAL | Status: DC

## 2014-12-10 NOTE — Patient Instructions (Signed)
You have been scheduled for an endoscopy. Please follow written instructions given to you at your visit today. If you use inhalers (even only as needed), please bring them with you on the day of your procedure. Your physician has requested that you go to www.startemmi.com and enter the access code given to you at your visit today. This web site gives a general overview about your procedure. However, you should still follow specific instructions given to you by our office regarding your preparation for the procedure.  We have sent the following medications to your pharmacy for you to pick up at your convenience: Baclofen 5 mg 3 times daily for hiccups  Continue your fiber to help with bowels.  Continue your Nexium.

## 2014-12-10 NOTE — Progress Notes (Signed)
   Subjective:    Patient ID: [name], male    DOB: [birth year], 81 y.o.   MRN: [id number]  HPI [name] is a 81 year old male with past medical history of Barrett's esophagus with history of low-grade dysplasia, GERD, hiatal hernia, IBS and hiccups who is seen in follow-up. He was last seen in June 2016. At that time he was having uncontrolled hiccups and we started him on baclofen 5 mg 3 times daily. He reports this has significantly improved to stick [place] they have resolved. When he tries to miss the medication the headache and return. He denies dizziness, lightheadedness, or recent falling. Denies heartburn though he does remain on Nexium 40 mg daily. He denies dysphagia or odynophagia today. Has history of chronic loose stools as well but this has resolved with soluble fiber therapy. He is using this 3-4 times a day. He is now off Reglan.  Review of Systems As per history of present illness, otherwise negative  Current Medications, Allergies, Past Medical History, Past Surgical History, Family History and Social History were reviewed in [employer] record.     Objective:   Physical Exam BP 110/58 mmHg  Pulse 80  Ht 5\' 4"  (1.626 m)  Wt 158 lb 8 oz (71.895 kg)  BMI 27.19 kg/m2 Constitutional: Well-developed and well-nourished. No distress. HEENT: Normocephalic and atraumatic.  Conjunctivae are normal.  No scleral icterus. Neck: Neck supple. Trachea midline. Cardiovascular: Normal rate, regular rhythm and intact distal pulses.  Pulmonary/chest: Effort normal and breath sounds normal. No wheezing, rales or rhonchi. Abdominal: Soft, nontender, nondistended. Bowel sounds active throughout.  Extremities: no clubbing, cyanosis, or edema Neurological: Alert and oriented to person place and time. Psychiatric: Normal mood and affect. Behavior is normal.     Assessment & Plan:   81 year old male with past medical history of Barrett's esophagus with history  of low-grade dysplasia, GERD, hiatal hernia, IBS and hiccups who is seen in follow-up.  1. GERD/hiccups -- hiccups have resolved with baclofen but return when withdrawn. We'll continue baclofen 5 mg 3 times a day along with Nexium 40 mg daily. He is off metoclopramide therapy.  2. Barrett's esophagus -- low-grade dysplasia in February 2015, Barrett's without dysplasia in December 2015. He is very strongly in favor of repeating upper endoscopy for surveillance. Given his low-grade dysplasia I feel this is reasonable assuming that he continues in his current state of health. I've explained that we will need to evaluate him in December and if he continues to do well we will repeat upper endoscopy for Barrett's biopsies. Continue Nexium 40 mg daily as above  3. Chronic loose stools -- resolved with fiber supplementation, continue soluble fiber 3 times daily

## 2014-12-19 ENCOUNTER — Other Ambulatory Visit: Payer: Self-pay | Admitting: Internal Medicine

## 2014-12-20 NOTE — Telephone Encounter (Signed)
Fay Records, MD at 06/28/2014 1:59 PM  lisinopril-hydrochlorothiazide (PRINZIDE,ZESTORETIC) 10-12.5 MG per tabletTake 1 tablet by mouth daily with breakfast simvastatin (ZOCOR) 20 MG tabletTake 1 tablet (20 mg total) by mouth daily Current medicines are reviewed at length with the patient today.   The following changes have been made: No changes  Medication Detail      Disp Refills Start End     simvastatin (ZOCOR) 20 MG tablet 90 tablet 3 06/28/2014     Sig - Route: Take 1 tablet (20 mg total) by mouth daily. - Oral    E-Prescribing Status: Receipt confirmed by pharmacy (06/28/2014 2:29 PM EDT)

## 2014-12-31 ENCOUNTER — Ambulatory Visit (INDEPENDENT_AMBULATORY_CARE_PROVIDER_SITE_OTHER): Payer: Medicare Other | Admitting: Internal Medicine

## 2014-12-31 ENCOUNTER — Encounter: Payer: Self-pay | Admitting: Internal Medicine

## 2014-12-31 VITALS — BP 140/72 | HR 54 | Ht 63.0 in | Wt 157.0 lb

## 2014-12-31 DIAGNOSIS — E785 Hyperlipidemia, unspecified: Secondary | ICD-10-CM

## 2014-12-31 DIAGNOSIS — I5022 Chronic systolic (congestive) heart failure: Secondary | ICD-10-CM | POA: Diagnosis not present

## 2014-12-31 DIAGNOSIS — I429 Cardiomyopathy, unspecified: Secondary | ICD-10-CM

## 2014-12-31 DIAGNOSIS — Z23 Encounter for immunization: Secondary | ICD-10-CM

## 2014-12-31 DIAGNOSIS — I10 Essential (primary) hypertension: Secondary | ICD-10-CM

## 2014-12-31 LAB — CBC WITH DIFFERENTIAL/PLATELET
Basophils Absolute: 0 10*3/uL (ref 0.0–0.1)
Basophils Relative: 0 % (ref 0–1)
EOS ABS: 0.1 10*3/uL (ref 0.0–0.7)
EOS PCT: 2 % (ref 0–5)
HEMATOCRIT: 39.9 % (ref 39.0–52.0)
Hemoglobin: 13.6 g/dL (ref 13.0–17.0)
LYMPHS ABS: 1.4 10*3/uL (ref 0.7–4.0)
LYMPHS PCT: 20 % (ref 12–46)
MCH: 29.3 pg (ref 26.0–34.0)
MCHC: 34.1 g/dL (ref 30.0–36.0)
MCV: 86 fL (ref 78.0–100.0)
MPV: 10.6 fL (ref 8.6–12.4)
Monocytes Absolute: 0.8 10*3/uL (ref 0.1–1.0)
Monocytes Relative: 12 % (ref 3–12)
Neutro Abs: 4.6 10*3/uL (ref 1.7–7.7)
Neutrophils Relative %: 66 % (ref 43–77)
Platelets: 205 10*3/uL (ref 150–400)
RBC: 4.64 MIL/uL (ref 4.22–5.81)
RDW: 14.1 % (ref 11.5–15.5)
WBC: 7 10*3/uL (ref 4.0–10.5)

## 2014-12-31 LAB — BASIC METABOLIC PANEL
BUN: 19 mg/dL (ref 7–25)
CALCIUM: 8.9 mg/dL (ref 8.6–10.3)
CO2: 29 mmol/L (ref 20–31)
CREATININE: 1.08 mg/dL (ref 0.70–1.11)
Chloride: 105 mmol/L (ref 98–110)
Glucose, Bld: 99 mg/dL (ref 65–99)
Potassium: 4.7 mmol/L (ref 3.5–5.3)
Sodium: 142 mmol/L (ref 135–146)

## 2014-12-31 LAB — LIPID PANEL
CHOLESTEROL: 129 mg/dL (ref 125–200)
HDL: 29 mg/dL — ABNORMAL LOW (ref >=40)
LDL CALC: 74 mg/dL (ref <130)
TRIGLYCERIDES: 131 mg/dL (ref <150)
Total CHOL/HDL Ratio: 4.4 Ratio (ref <=5.0)
VLDL: 26 mg/dL (ref <30)

## 2014-12-31 LAB — TSH: TSH: 2.739 u[IU]/mL (ref 0.350–4.500)

## 2014-12-31 NOTE — Patient Instructions (Signed)
Medication Instructions:  Your physician recommends that you continue on your current medications as directed. Please refer to the Current Medication list given to you today.   Labwork: TODAY:  CBC W/DIFF                 BMET                 LIPID                 TSH  Testing/Procedures: None ordered  Follow-Up: Your physician wants you to follow-up in: [place].  You will receive a reminder letter in the mail two months in advance. If you don't receive a letter, please call our office to schedule the follow-up appointment.   Any Other Special Instructions Will Be Listed Below (If Applicable).  We have called and scheduled you for a follow-up with Dr. Arlyss Queen (PCP) for March 24, 2015 @ 10:45.  If you are unable to keep this appointment, please call them to reschedule at [phone].

## 2014-12-31 NOTE — Progress Notes (Signed)
Cardiology Office Note   Date:  12/31/2014   ID:  [name], DOB January 18, 1922, MRN [id number]  PCP:  Kennon Portela, MD  Cardiologist:   Dorris Carnes, MD      F/u on NICM History of Present Illness: [name] is a 81 y.o. male with a history of  Presumed NICM.  Normal myoview in past  Also a history of HL and HTN, Barretts esophagus, hiccops  I saw him in April 2016    No SOB NO CP  No dizziness Hard of hearing Memory slipping some Lives at [facility] it.  Walks  [facility]    Current Outpatient Prescriptions  Medication Sig Dispense Refill  . baclofen (LIORESAL) 10 MG tablet Take 0.5 tablets (5 mg total) by mouth 3 (three) times daily. 35 tablet 2  . Besifloxacin HCl 0.6 % SUSP Apply 1 drop to eye as directed.    Marland Kitchen esomeprazole (NEXIUM) 40 MG capsule Take 1 capsule by mouth  every day before first meal of the day 90 capsule 0  . feeding supplement, ENSURE COMPLETE, (ENSURE COMPLETE) LIQD Take 237 mLs by mouth at bedtime. 30 Bottle 1  . furosemide (LASIX) 20 MG tablet Take 1-2 tablets (20-40 mg total) by mouth daily. Take 20mg  one day then alternate with 40mg  the next day 90 tablet 3  . ketorolac (ACULAR) 0.4 % SOLN Place 1 drop into the left eye as directed.    Marland Kitchen lisinopril-hydrochlorothiazide (PRINZIDE,ZESTORETIC) 10-12.5 MG per tablet Take 1 tablet by mouth  daily with breakfast 90 tablet 1  . methylcellulose (SOLUBLE FIBER THERAPY) oral powder Take 1 packet by mouth 4 (four) times daily.    . metoprolol succinate (TOPROL-XL) 25 MG 24 hr tablet Take 1 tablet by mouth  daily with breakfast. 90 tablet 1  . Multiple Vitamins-Minerals (VITEYES AREDS ADVANCED) CAPS Take 1 capsule by mouth 2 (two) times daily.    . Probiotic Product (PROBIOTIC DAILY PO) Take 1 tablet by mouth daily.    . simvastatin (ZOCOR) 20 MG tablet Take 1 tablet (20 mg total) by mouth daily. 90 tablet 3  . Tamsulosin HCl (FLOMAX) 0.4 MG CAPS Take 0.4 mg by mouth daily.      No  current facility-administered medications for this visit.    Allergies:   Ciprofloxacin and Penicillins   Past Medical History  Diagnosis Date  . BARRETTS ESOPHAGUS 11/19/2007  . CARDIOMYOPATHY 08/02/2008  . CHANGE IN BOWELS 02/01/2009  . CHF 08/10/2008  . Diarrhea 01/31/2009  . DIVERTICULOSIS, COLON 11/19/2007  . GASTRITIS 11/19/2007  . GERD 11/19/2007  . HIATAL HERNIA 11/19/2007  . HYPERLIPIDEMIA 06/27/2009  . HYPOTHYROIDISM 06/27/2009  . MELENA 08/01/2009  . PERSONAL HX COLONIC POLYPS 02/01/2009  . TINEA CORPORIS 06/27/2009  . TOBACCO USE, QUIT 06/27/2009  . TREMOR, ESSENTIAL 06/27/2009  . HTN (hypertension)   . IBS (irritable bowel syndrome)   . Heart murmur   . Blood transfusion without reported diagnosis     Past Surgical History  Procedure Laterality Date  . Cholecystectomy    . Lung surgery  1984  . Umbilical hernia repair    . Tonsillectomy    . Vasectomy    . Esophagogastroduodenoscopy N/A 04/28/2013    Procedure: ESOPHAGOGASTRODUODENOSCOPY (EGD);  Surgeon: Jerene Bears, MD;  Location: [facility];  Service: Gastroenterology;  Laterality: N/A;  . Tonsillectomy    . Hernia repair    . Hip arthroplasty Left 11/30/2013    Procedure: ARTHROPLASTY BIPOLAR  HIP;  Surgeon: [name], MD;  Location: [facility];  Service: Orthopedics;  Laterality: Left;     Social History:  The patient  reports that he quit smoking about 71 years ago. His smoking use included Cigarettes. He has a 72 pack-year smoking history. He has never used smokeless tobacco. He reports that he drinks about 0.5 oz of alcohol per week. He reports that he does not use illicit drugs.   Family History:  The patient's family history includes Colon cancer in his father; Heart disease in his mother; Prostate cancer in his brother.    ROS:  Please see the history of present illness. All other systems are reviewed and  Negative to the above problem except as noted.    PHYSICAL EXAM: VS:  BP 140/72 mmHg  Pulse 54  Ht 5\' 3"   (1.6 m)  Wt 157 lb (71.215 kg)  BMI 27.82 kg/m2  GEN: Well nourished, well developed, in no acute distress HEENT: normal Neck: no JVD, carotid bruits, or masses Cardiac: RRR; no murmurs, rubs, or gallops,tr  edema  Respiratory:  clear to auscultation bilaterally, normal work of breathing GI: soft, nontender, nondistended, + BS  No hepatomegaly  MS: no deformity Moving all extremities   Skin: warm and dry, no rash Neuro:  Strength and sensation are intact Psych: euthymic mood, full affect   EKG:  EKG is ordered today.  SB  54  bpm  LAFB  Poor R wave prgression     Lipid Panel    Component Value Date/Time   CHOL 127 02/10/2014 1220   TRIG 150* 02/10/2014 1220   TRIG 95 05/03/2009   HDL 31* 02/10/2014 1220   CHOLHDL 4.1 02/10/2014 1220   VLDL 30 02/10/2014 1220   LDLCALC 66 02/10/2014 1220      Wt Readings from Last 3 Encounters:  12/31/14 157 lb (71.215 kg)  12/10/14 158 lb 8 oz (71.895 kg)  09/20/14 158 lb 4 oz (71.782 kg)      ASSESSMENT AND PLAN:  1  NICM  Volume status looks good  Keep on same regimen  F/U with BMET today  2.  HL  Check lipids today.  Flu shot today  F/U in 6 months    Signed, Dorris Carnes, MD  12/31/2014 9:11 AM    [address], [place], [place]  [address] Phone: [phone]; Fax: [phone]

## 2015-01-04 NOTE — Addendum Note (Signed)
Addended by: Freada Bergeron on: 01/04/2015 02:07 PM   Modules accepted: Orders

## 2015-01-18 ENCOUNTER — Other Ambulatory Visit: Payer: Self-pay | Admitting: Internal Medicine

## 2015-01-21 ENCOUNTER — Other Ambulatory Visit: Payer: Self-pay | Admitting: Internal Medicine

## 2015-01-25 ENCOUNTER — Encounter (INDEPENDENT_AMBULATORY_CARE_PROVIDER_SITE_OTHER): Payer: Medicare Other | Admitting: Ophthalmology

## 2015-01-25 DIAGNOSIS — H35033 Hypertensive retinopathy, bilateral: Secondary | ICD-10-CM | POA: Diagnosis not present

## 2015-01-25 DIAGNOSIS — H43813 Vitreous degeneration, bilateral: Secondary | ICD-10-CM | POA: Diagnosis not present

## 2015-01-25 DIAGNOSIS — H353114 Nonexudative age-related macular degeneration, right eye, advanced atrophic with subfoveal involvement: Secondary | ICD-10-CM | POA: Diagnosis not present

## 2015-01-25 DIAGNOSIS — H353221 Exudative age-related macular degeneration, left eye, with active choroidal neovascularization: Secondary | ICD-10-CM

## 2015-01-25 DIAGNOSIS — I10 Essential (primary) hypertension: Secondary | ICD-10-CM

## 2015-02-16 ENCOUNTER — Encounter: Payer: Self-pay | Admitting: Internal Medicine

## 2015-02-21 ENCOUNTER — Other Ambulatory Visit: Payer: Self-pay

## 2015-02-21 MED ORDER — ESOMEPRAZOLE MAGNESIUM 40 MG PO CPDR: DELAYED_RELEASE_CAPSULE | ORAL | Status: DC

## 2015-02-25 ENCOUNTER — Other Ambulatory Visit: Payer: Self-pay | Admitting: Internal Medicine

## 2015-02-25 ENCOUNTER — Ambulatory Visit (AMBULATORY_SURGERY_CENTER): Payer: Medicare Other | Admitting: Internal Medicine

## 2015-02-25 ENCOUNTER — Encounter: Payer: Self-pay | Admitting: Internal Medicine

## 2015-02-25 VITALS — BP 132/64 | HR 186 | Temp 97.4°F | Resp 17 | Ht 64.0 in | Wt 158.0 lb

## 2015-02-25 DIAGNOSIS — K2270 Barrett's esophagus without dysplasia: Secondary | ICD-10-CM | POA: Diagnosis present

## 2015-02-25 MED ORDER — SODIUM CHLORIDE 0.9 % IV SOLN: 500.0000 mL | INTRAVENOUS | Status: DC

## 2015-02-25 NOTE — Patient Instructions (Addendum)
YOU HAD AN ENDOSCOPIC PROCEDURE TODAY AT [facility]:   Refer to the procedure report that was given to you for any specific questions about what was found during the examination.  If the procedure report does not answer your questions, please call your gastroenterologist to clarify.  If you requested that your care partner not be given the details of your procedure findings, then the procedure report has been included in a sealed envelope for you to review at your convenience later.  YOU SHOULD EXPECT: Some feelings of bloating in the abdomen. Passage of more gas than usual.  Walking can help get rid of the air that was put into your GI tract during the procedure and reduce the bloating. If you had a lower endoscopy (such as a colonoscopy or flexible sigmoidoscopy) you may notice spotting of blood in your stool or on the toilet paper. If you underwent a bowel prep for your procedure, you may not have a normal bowel movement for a few days.  Please Note:  You might notice some irritation and congestion in your nose or some drainage.  This is from the oxygen used during your procedure.  There is no need for concern and it should clear up in a day or so.  SYMPTOMS TO REPORT IMMEDIATELY:     Following upper endoscopy (EGD)  Vomiting of blood or coffee ground material  New chest pain or pain under the shoulder blades  Painful or persistently difficult swallowing  New shortness of breath  Fever of 100F or higher  Black, tarry-looking stools  For urgent or emergent issues, a gastroenterologist can be reached at any hour by calling [phone].   DIET: Your first meal following the procedure should be a small meal and then it is ok to progress to your normal diet. Heavy or fried foods are harder to digest and may make you feel nauseous or bloated.  Likewise, meals heavy in dairy and vegetables can increase bloating.  Drink plenty of fluids but you should avoid alcoholic beverages  for 24 hours.  ACTIVITY:  You should plan to take it easy for the rest of today and you should NOT DRIVE or use heavy machinery until tomorrow (because of the sedation medicines used during the test).    FOLLOW UP: Our staff will call the number listed on your records the next business day following your procedure to check on you and address any questions or concerns that you may have regarding the information given to you following your procedure. If we do not reach you, we will leave a message.  However, if you are feeling well and you are not experiencing any problems, there is no need to return our call.  We will assume that you have returned to your regular daily activities without incident.  If any biopsies were taken you will be contacted by phone or by letter within the next 1-3 weeks.  Please call us at [phone] if you have not heard about the biopsies in 3 weeks.    SIGNATURES/CONFIDENTIALITY: You and/or your care partner have signed paperwork which will be entered into your electronic medical record.  These signatures attest to the fact that that the information above on your After Visit Summary has been reviewed and is understood.  Full responsibility of the confidentiality of this discharge information lies with you and/or your care-partner.   Continue anti reflux medication ([place])   AWAIT BIOPSY RESULTS

## 2015-02-25 NOTE — Progress Notes (Signed)
Called to room to assist during endoscopic procedure.  Patient ID and intended procedure confirmed with present staff. Received instructions for my participation in the procedure from the performing physician.  

## 2015-02-25 NOTE — Op Note (Signed)
[facility]  [address] [place], [id number]   ENDOSCOPY PROCEDURE REPORT  PATIENT: [name], [name]  MR#: [id number] BIRTHDATE: [birth year] , 89  yrs. old GENDER: male ENDOSCOPIST: Jerene Bears, MD PROCEDURE DATE:  02/25/2015 PROCEDURE:  EGD, screening and EGD w/ biopsy ASA CLASS:     Class III INDICATIONS:  follow up of Barrett's esophagus.  history of Barrett's with low-grade dysplasia MEDICATIONS: Monitored anesthesia care and Propofol 140 mg IV TOPICAL ANESTHETIC: none  DESCRIPTION OF PROCEDURE: After the risks benefits and alternatives of the procedure were thoroughly explained, informed consent was obtained.  The LB [id number] endoscope was introduced through the mouth and advanced to the second portion of the duodenum , Without limitations.  The instrument was slowly withdrawn as the mucosa was fully examined.   ESOPHAGUS: There was a 5cm segment of Barrett's esophagus found 30 cm from the incisors.  The length of circumferential Barrett's was 2cm ([place] C2, M5).  There was no nodular mucosa noted in the Barrett's segment.  Multiple biopsies were performed using cold forceps.   Overall the Barrett's mucosa looks unchanged from last examination.  STOMACH: A 4-5 cm hiatal hernia was noted.   The mucosa of the stomach appeared normal.  DUODENUM: A medium sized diverticulum was found in the peri-ampullary area.   The duodenal mucosa showed no abnormalities in the bulb and 2nd part of the duodenum.  Retroflexed views revealed a hiatal hernia.     The scope was then withdrawn from the patient and the procedure completed.  COMPLICATIONS: There were no immediate complications.  ENDOSCOPIC IMPRESSION: 1.   There was a 5cm segment of Barrett's esophagus found in distal esophagus; multiple biopsies 2.   4-5 cm hiatal hernia was noted. 3.   The mucosa of the stomach appeared normal 4.   Diverticulum was found in the peri-ampullary area 5.   The  duodenal mucosa showed no abnormalities in the bulb and 2nd part of the duodenum  RECOMMENDATIONS: 1.  Await biopsy results 2.  Continue PPI  eSigned:  Jerene Bears, MD 02/25/2015 2:34 PM CC: the patient, PCP

## 2015-02-25 NOTE — Progress Notes (Signed)
To recovery, report to [place], RN, VSS 

## 2015-02-28 ENCOUNTER — Telehealth: Payer: Self-pay | Admitting: Emergency Medicine

## 2015-02-28 NOTE — Telephone Encounter (Signed)
  Follow up Call-  Call back number 02/25/2015 03/15/2014  Post procedure Call Back phone  # [phone]  Permission to leave phone message Yes Yes     Patient questions:  Do you have a fever, pain , or abdominal swelling? No. Pain Score  0 *  Have you tolerated food without any problems? Yes.    Have you been able to return to your normal activities? Yes.    Do you have any questions about your discharge instructions: Diet   No. Medications  No. Follow up visit  No.  Do you have questions or concerns about your Care? No.  Actions: * If pain score is 4 or above: No action needed, pain <4.

## 2015-03-01 ENCOUNTER — Ambulatory Visit (INDEPENDENT_AMBULATORY_CARE_PROVIDER_SITE_OTHER): Payer: Medicare Other | Admitting: Physician Assistant

## 2015-03-01 ENCOUNTER — Ambulatory Visit (INDEPENDENT_AMBULATORY_CARE_PROVIDER_SITE_OTHER): Payer: Medicare Other

## 2015-03-01 ENCOUNTER — Encounter: Payer: Self-pay | Admitting: Physician Assistant

## 2015-03-01 VITALS — BP 130/85 | HR 105 | Temp 98.1°F | Resp 16 | Ht 65.5 in | Wt 156.2 lb

## 2015-03-01 DIAGNOSIS — M545 Low back pain, unspecified: Secondary | ICD-10-CM

## 2015-03-01 DIAGNOSIS — S32000A Wedge compression fracture of unspecified lumbar vertebra, initial encounter for closed fracture: Secondary | ICD-10-CM

## 2015-03-01 DIAGNOSIS — T148XXA Other injury of unspecified body region, initial encounter: Secondary | ICD-10-CM

## 2015-03-01 DIAGNOSIS — D171 Benign lipomatous neoplasm of skin and subcutaneous tissue of trunk: Secondary | ICD-10-CM

## 2015-03-01 MED ORDER — MUPIROCIN 2 % EX OINT: 1.0000 "application " | TOPICAL_OINTMENT | Freq: Three times a day (TID) | CUTANEOUS | Status: DC

## 2015-03-01 NOTE — Progress Notes (Signed)
[facility] [address] [place] [address] [phone]  Date:  03/01/2015   Name:  [name]   DOB:  06-28-1921   MRN:  [id number]  PCP:  Kennon Portela, MD    Chief Complaint: Fall   History of Present Illness:  Pt presents with his son.  This is a 81 y.o. male with PMH CHF, A-fib, HTN, HLD, CKD stage 2, barrett's esophagus who is presenting after a fall that occurred earlier today. Pt lives in independent living at [facility]. He woke up late and realized he was late for breakfast. He was hurrying to get ready when he tripped. He felt forwards. He hit his left knee on the group and hit his left forearm on a table. He did not hit his head or experience LOC. EMS was called. They helped him up and dressed the abrasion on his forearm. EMS did not feel he needed to be evaluated in ED. Pt realized as the day went on that his lower back was aching. He had a left hip replacement 1 year ago and he wants to make sure that is ok. He is not complaining of left arm or left knee pain. He states the left lower part of his back is aching. He is able to move and walk ok and without pain. He denies pain into his legs or paresthesias. No problems with bowel or bladder. He is otherwise feeling ok - no malaise, fever, chills, fatigue, cough. He has been using walker since incident this morning but does not usually use.  Saw cardiologist 10/7 - no change in meds at that time. Compliant with meds. Take in the morning. Hx barrett's esophagus - endoscopy for surveillance with biopsies on 02/25/15 Has not been here for CPE in 2 years. Dr. Elder Cyphers used to be his PCP.  Review of Systems:  Review of Systems See HPI  Patient Active Problem List   Diagnosis Date Noted  . Hyponatremia 12/03/2013  . Leukocytosis, unspecified 12/02/2013  . Femoral neck fracture ([place]) 11/30/2013  . Acute on chronic renal failure ([place]) 11/30/2013  . Chronic combined systolic and diastolic CHF  (congestive heart failure) ([place]) 11/30/2013  . A-fib ([place]) 07/11/2013  . Protein-calorie malnutrition, severe ([place]) 07/10/2013  . Acute encephalopathy 07/10/2013  . CAP (community acquired pneumonia) 07/09/2013  . CKD (chronic kidney disease) stage 2, GFR 60-89 ml/min 07/09/2013  . Metabolic acidosis [id number]  . Septic shock ([place]) 07/09/2013  . BPH (benign prostatic hyperplasia) 02/04/2012  . MELENA 08/01/2009  . TINEA CORPORIS 06/27/2009  . HYPERLIPIDEMIA 06/27/2009  . TREMOR, ESSENTIAL 06/27/2009  . HYPERTENSION 06/27/2009  . TOBACCO USE, QUIT 06/27/2009  . PERSONAL HX COLONIC POLYPS 02/01/2009  . DIARRHEA 01/31/2009  . GERD 11/19/2007  . BARRETTS ESOPHAGUS 11/19/2007  . GASTRITIS 11/19/2007  . HIATAL HERNIA 11/19/2007  . DIVERTICULOSIS, COLON 11/19/2007    Prior to Admission medications   Medication Sig Start Date End Date Taking? Authorizing Provider  esomeprazole (NEXIUM) 40 MG capsule TAKE 1 CAPSULE BY MOUTH  EVERY DAY BEFORE FIRST MEAL OF THE DAY 02/21/15  Yes Jerene Bears, MD  feeding supplement, ENSURE COMPLETE, (ENSURE COMPLETE) LIQD Take 237 mLs by mouth at bedtime. 07/14/13  Yes Annita Brod, MD  furosemide (LASIX) 20 MG tablet Take 1-2 tablets (20-40 mg total) by mouth daily. Take 20mg  one day then alternate with 40mg  the next day 06/28/14  Yes Fay Records, MD  lisinopril-hydrochlorothiazide (PRINZIDE,ZESTORETIC) 10-12.5 MG tablet  Take 1 tablet by mouth  daily with breakfast 02/25/15  Yes Fay Records, MD  metoprolol succinate (TOPROL-XL) 25 MG 24 hr tablet Take 1 tablet by mouth  daily with breakfast 01/18/15  Yes Fay Records, MD  simvastatin (ZOCOR) 20 MG tablet Take 1 tablet (20 mg total) by mouth daily. 06/28/14  Yes Fay Records, MD  Tamsulosin HCl (FLOMAX) 0.4 MG CAPS Take 0.4 mg by mouth daily.    Yes Historical Provider, MD                                              Allergies  Allergen Reactions  . Ciprofloxacin Other (See Comments)     Questionable allergy as pt reports taking med without difficulty for resp infection  . Penicillins Rash    Past Surgical History  Procedure Laterality Date  . Cholecystectomy    . Lung surgery  1984  . Umbilical hernia repair    . Tonsillectomy    . Vasectomy    . Esophagogastroduodenoscopy N/A 04/28/2013    Procedure: ESOPHAGOGASTRODUODENOSCOPY (EGD);  Surgeon: Jerene Bears, MD;  Location: [facility];  Service: Gastroenterology;  Laterality: N/A;  . Tonsillectomy    . Hernia repair    . Hip arthroplasty Left 11/30/2013    Procedure: ARTHROPLASTY BIPOLAR HIP;  Surgeon: [name], MD;  Location: [facility];  Service: Orthopedics;  Laterality: Left;    Social History  Substance Use Topics  . Smoking status: Former Smoker -- 3.00 packs/day for 24 years    Types: Cigarettes    Quit date: 03/27/1943  . Smokeless tobacco: Never Used     Comment: Widowed x 2, has lady friend  . Alcohol Use: 0.6 oz/week    1 Standard drinks or equivalent per week     Comment: occassional    Family History  Problem Relation Age of Onset  . Heart disease Mother   . Colon cancer Father   . Prostate cancer Brother     Medication list has been reviewed and updated.  Physical Examination:  Physical Exam  Constitutional: He is oriented to person, place, and time. He appears well-developed and well-nourished. No distress.  HENT:  Head: Normocephalic and atraumatic.  Nose: Nose normal.  Poor hearing  Eyes: Conjunctivae and lids are normal. Right eye exhibits no discharge. Left eye exhibits no discharge. No scleral icterus.  Cardiovascular: Normal rate, regular rhythm, normal heart sounds and normal pulses.   No murmur heard. Pulmonary/Chest: Effort normal and breath sounds normal. No respiratory distress. He has no wheezes. He has no rhonchi. He has no rales.  Musculoskeletal: Normal range of motion.       Right elbow: Normal.      Left elbow: Normal.       Right wrist: Normal.       Left  wrist: Normal.       Right hip: Normal. He exhibits normal range of motion and no tenderness.       Left hip: Normal. He exhibits normal range of motion and no tenderness.       Right knee: Normal.       Left knee: He exhibits normal range of motion and no swelling. No tenderness found.       Cervical back: Normal.       Lumbar back: He exhibits tenderness (left paraspinal). He exhibits  no bony tenderness, no swelling and no deformity.       Right forearm: He exhibits laceration. He exhibits no tenderness and no deformity.  Small bruise on left anterior knee  Lymphadenopathy:       Head (right side): No submental, no submandibular and no tonsillar adenopathy present.       Head (left side): No submental, no submandibular and no tonsillar adenopathy present.    He has no cervical adenopathy.  Neurological: He is alert and oriented to person, place, and time. He has normal strength and normal reflexes. No sensory deficit. Gait (slow, using walker) abnormal.  Skin: Skin is warm and dry.  3 cm abrasion on left forearm. Mild bleeding present. No significant tenderness. No deformity. Wound cleansed with soap and water. Dressing with mupirocin placed.  Large 5-6 cm soft mobile mass left thoracic back. Non-tender. Consistent with lipoma.  Psychiatric: He has a normal mood and affect. His speech is normal and behavior is normal. Thought content normal.   BP 130/85 mmHg  Pulse 105  Temp(Src) 98.1 F (36.7 C) (Oral)  Resp 16  Ht 5' 5.5" (1.664 m)  Wt 156 lb 3.2 oz (70.852 kg)  BMI 25.59 kg/m2  Radiograph IMPRESSION: Degenerative changes with a mild scoliosis.  Stable anterolisthesis of L4 on L5  Mild superior endplate compression fracture at L2, new since 07/08/2013 but age indeterminate  Chronic T12 compression fracture  Aortic atherosclerosis  Assessment and Plan:  1. Left-sided low back pain without sciatica 2. Compression fracture of lumbar vertebra, closed, initial  encounter Radiograph showed new mild compression fracture since last lumbar radiograph 06/2013. Age of compression fracture indeterminate. Suspect current pain d/t muscle strain rather than acute fracture. Pt not in much pain. He can use tylenol 1 gm TID prn pain. Continue walker for next few days at least, or as long as feels unsteady. Return in 2 weeks if symptoms not improving. Discussed return precautions. - DG Lumbar Spine Complete; Future  3. Abrasion Abrasion on forearm cleaned and dressed. Advised he change dressing daily and apply mupirocin. He will return on 12/10 for wound check. - mupirocin ointment (BACTROBAN) 2 %; Apply 1 application topically 3 (three) times daily.  Dispense: 22 g; Refill: 1  4. Lipoma of back Discovered large lipoma on right side back. Son had never seen before. Pt states he has known about it for a while. Looking back, Dr. Linna Darner noted it during a visit in 2015. It has not grown and is asymptomatic. Will monitor.  Pt has not been here for a physical in 2 years. He will return for physical at the start of the year.  Benjaman Pott Drenda Freeze, MHS [facility]  03/01/2015

## 2015-03-01 NOTE — Patient Instructions (Addendum)
You may take tylenol 2 tabs three times a day AS NEEDED for pain. Change your dressing daily. Wash with warm soap and water. Apply mupirocin ointment Return to walk in clinic on Saturday. Return for physical.

## 2015-03-03 DIAGNOSIS — Z471 Aftercare following joint replacement surgery: Secondary | ICD-10-CM | POA: Diagnosis not present

## 2015-03-03 DIAGNOSIS — Z96642 Presence of left artificial hip joint: Secondary | ICD-10-CM | POA: Diagnosis not present

## 2015-03-04 ENCOUNTER — Encounter: Payer: Self-pay | Admitting: Internal Medicine

## 2015-03-05 ENCOUNTER — Ambulatory Visit (INDEPENDENT_AMBULATORY_CARE_PROVIDER_SITE_OTHER): Payer: Medicare Other | Admitting: Emergency Medicine

## 2015-03-05 ENCOUNTER — Ambulatory Visit (INDEPENDENT_AMBULATORY_CARE_PROVIDER_SITE_OTHER): Payer: Medicare Other

## 2015-03-05 VITALS — BP 124/66 | HR 88 | Temp 98.2°F | Resp 14 | Ht 65.5 in | Wt 153.0 lb

## 2015-03-05 DIAGNOSIS — S50812A Abrasion of left forearm, initial encounter: Secondary | ICD-10-CM | POA: Diagnosis not present

## 2015-03-05 DIAGNOSIS — M25512 Pain in left shoulder: Secondary | ICD-10-CM

## 2015-03-05 DIAGNOSIS — T148XXA Other injury of unspecified body region, initial encounter: Secondary | ICD-10-CM | POA: Insufficient documentation

## 2015-03-05 NOTE — Patient Instructions (Signed)
Abrasion °An abrasion is a cut or scrape on the surface of your skin. An abrasion does not go through all of the layers of your skin. It is important to take good care of your abrasion to prevent infection. °HOME CARE °Medicines °· Take or apply medicines only as told by your doctor. °· If you were prescribed an antibiotic ointment, finish all of it even if you start to feel better. °Wound Care °· Clean the wound with mild soap and water 2-3 times per day or as told by your doctor. Pat your wound dry with a clean towel. Do not rub it. °· There are many ways to close and cover a wound. Follow instructions from your doctor about: °¨ How to take care of your wound. °¨ When and how you should change your bandage (dressing). °¨ When and how you should take off your dressing. °· Check your wound every day for signs of infection. Watch for: °¨ Redness, swelling, or pain. °¨ Fluid, blood, or pus. °General Instructions °· Keep the dressing dry as told by your doctor. Do not take baths, swim, use a hot tub, or do anything that would put your wound underwater until your doctor says it is okay. °· If there is swelling, raise (elevate) the injured area above the level of your heart while you are sitting or lying down. °· Keep all follow-up visits as told by your doctor. This is important. °GET HELP IF: °· You were given a tetanus shot and you have any of these where the needle went in: °¨ Swelling. °¨ Very bad pain. °¨ Redness. °¨ Bleeding. °· Medicine does not help your pain. °· You have any of these at the site of the wound: °¨ More redness. °¨ More swelling. °¨ More pain. °GET HELP RIGHT AWAY IF: °· You have a red streak going away from your wound. °· You have a fever. °· You have fluid, blood, or pus coming from your wound. °· There is a bad smell coming from your wound. °  °This information is not intended to replace advice given to you by your health care provider. Make sure you discuss any questions you have with your  health care provider. °  °Document Released: 08/29/2007 Document Revised: 07/27/2014 Document Reviewed: 03/10/2014 °Elsevier Interactive Patient Education ©2016 Elsevier Inc. ° °

## 2015-03-05 NOTE — Progress Notes (Signed)
Patient ID: [name], male   DOB: [birth year], 81 y.o.   MRN: [id number]     By signing my name below, I, Zola Button, attest that this documentation has been prepared under the direction and in the presence of Arlyss Queen, MD.  Electronically Signed: Zola Button, Medical Scribe. 03/05/2015. 9:21 AM.   Chief Complaint:  Chief Complaint  Patient presents with  . Wound Check    Left forearm    HPI: [name] is a 81 y.o. male with a history of Barrett's esophagus who [facility] to [facility] today complaining of gradual onset, aching, left shoulder pain that started in the past few days. Patient was seen here 4 days ago by Bennett Scrape, PA-C for back pain secondary to a fall that occurred that day. XR imaging was done on 03/01/15, which showed degenerative changes, stable anterolisthesis of L4 on L5, and mild superior endplate compression fracture at L2, new since April 2015.Marland Kitchen He was treated with Tylenol, but notes that it has not been providing relief and that he has been having difficulty sleeping due to the pain. Patient notes he has been having left shoulder pain since then and would like to have XR imaging of his shoulder. He has noticed the pain when using his left arm to prop himself up. He had an endoscopy 8 days ago which was normal.   Past Medical History  Diagnosis Date  . BARRETTS ESOPHAGUS 11/19/2007  . CARDIOMYOPATHY 08/02/2008  . CHANGE IN BOWELS 02/01/2009  . CHF 08/10/2008  . Diarrhea 01/31/2009  . DIVERTICULOSIS, COLON 11/19/2007  . GASTRITIS 11/19/2007  . GERD 11/19/2007  . HIATAL HERNIA 11/19/2007  . HYPERLIPIDEMIA 06/27/2009  . HYPOTHYROIDISM 06/27/2009  . MELENA 08/01/2009  . PERSONAL HX COLONIC POLYPS 02/01/2009  . TINEA CORPORIS 06/27/2009  . TOBACCO USE, QUIT 06/27/2009  . TREMOR, ESSENTIAL 06/27/2009  . HTN (hypertension)   . IBS (irritable bowel syndrome)   . Heart murmur   . Blood transfusion without reported diagnosis    Past Surgical History  Procedure Laterality Date    . Cholecystectomy    . Lung surgery  1984  . Umbilical hernia repair    . Tonsillectomy    . Vasectomy    . Esophagogastroduodenoscopy N/A 04/28/2013    Procedure: ESOPHAGOGASTRODUODENOSCOPY (EGD);  Surgeon: Jerene Bears, MD;  Location: [facility];  Service: Gastroenterology;  Laterality: N/A;  . Tonsillectomy    . Hernia repair    . Hip arthroplasty Left 11/30/2013    Procedure: ARTHROPLASTY BIPOLAR HIP;  Surgeon: [name], MD;  Location: [facility];  Service: Orthopedics;  Laterality: Left;   Social History   Social History  . Marital Status: Widowed    Spouse Name: N/A  . Number of Children: N/A  . Years of Education: N/A   Occupational History  . retired Clinical biochemist    Social History Main Topics  . Smoking status: Former Smoker -- 3.00 packs/day for 24 years    Types: Cigarettes    Quit date: 03/27/1943  . Smokeless tobacco: Never Used     Comment: Widowed x 2, has lady friend  . Alcohol Use: 0.6 oz/week    1 Standard drinks or equivalent per week     Comment: occassional  . Drug Use: No  . Sexual Activity: No   Other Topics Concern  . None   Social History Narrative   Family History  Problem Relation Age of Onset  . Heart disease Mother   . Colon  cancer Father   . Prostate cancer Brother    Allergies  Allergen Reactions  . Ciprofloxacin Other (See Comments)    Questionable allergy as pt reports taking med without difficulty for resp infection  . Penicillins Rash   Prior to Admission medications   Medication Sig Start Date End Date Taking? Authorizing Provider  baclofen (LIORESAL) 10 MG tablet Take 0.5 tablets (5 mg total) by mouth 3 (three) times daily. 12/10/14  Yes Jerene Bears, MD  Besifloxacin HCl 0.6 % SUSP Apply 1 drop to eye as directed.   Yes Historical Provider, MD  esomeprazole (NEXIUM) 40 MG capsule TAKE 1 CAPSULE BY MOUTH  EVERY DAY BEFORE FIRST MEAL OF THE DAY 02/21/15  Yes Jerene Bears, MD  feeding supplement, ENSURE COMPLETE, (ENSURE  COMPLETE) LIQD Take 237 mLs by mouth at bedtime. 07/14/13  Yes Annita Brod, MD  furosemide (LASIX) 20 MG tablet Take 1-2 tablets (20-40 mg total) by mouth daily. Take 20mg  one day then alternate with 40mg  the next day 06/28/14  Yes Fay Records, MD  ketorolac (ACULAR) 0.4 % SOLN Place 1 drop into the left eye as directed.   Yes Historical Provider, MD  lisinopril-hydrochlorothiazide (PRINZIDE,ZESTORETIC) 10-12.5 MG tablet Take 1 tablet by mouth  daily with breakfast 02/25/15  Yes Fay Records, MD  methylcellulose (SOLUBLE FIBER THERAPY) oral powder Take 1 packet by mouth 4 (four) times daily.   Yes Historical Provider, MD  metoprolol succinate (TOPROL-XL) 25 MG 24 hr tablet Take 1 tablet by mouth  daily with breakfast 01/18/15  Yes Fay Records, MD  Multiple Vitamins-Minerals (VITEYES AREDS ADVANCED) CAPS Take 1 capsule by mouth 2 (two) times daily.   Yes Historical Provider, MD  mupirocin ointment (BACTROBAN) 2 % Apply 1 application topically 3 (three) times daily. 03/01/15  Yes Bennett Scrape V, PA-C  Probiotic Product (PROBIOTIC DAILY PO) Take 1 tablet by mouth daily.   Yes Historical Provider, MD  simvastatin (ZOCOR) 20 MG tablet Take 1 tablet (20 mg total) by mouth daily. 06/28/14  Yes Fay Records, MD  Tamsulosin HCl (FLOMAX) 0.4 MG CAPS Take 0.4 mg by mouth daily.    Yes Historical Provider, MD     ROS: The patient denies fevers, chills, night sweats, unintentional weight loss, chest pain, palpitations, wheezing, dyspnea on exertion, dysuria, hematuria, melena, numbness, weakness, or tingling.   All other systems have been reviewed and were otherwise negative with the exception of those mentioned in the HPI and as above.    PHYSICAL EXAM: Filed Vitals:   03/05/15 0835  BP: 124/66  Pulse: 88  Temp: 98.2 F (36.8 C)  Resp: 14   Body mass index is 25.06 kg/(m^2).   General: Alert, no acute distress HEENT:  Normocephalic, atraumatic, oropharynx patent. Eye: Juliette Mangle  [facility] Cardiovascular:  Regular rate and rhythm, no rubs murmurs or gallops.  No Carotid bruits, radial pulse intact. No pedal edema.  Respiratory: Clear to auscultation bilaterally.  No wheezes, rales, or rhonchi.  No cyanosis, no use of accessory musculature Abdominal: No organomegaly, abdomen is soft and non-tender, positive bowel sounds.  No masses. Musculoskeletal: No tenderness over lower lumbar spine. Mild decreased full abduction of the left shoulder. Skin: No rashes. Neurologic: Facial musculature symmetric. Psychiatric: Patient acts appropriately throughout our interaction. Lymphatic: No cervical or submandibular lymphadenopathy     LABS:    EKG/XRAY:   Primary read interpreted by Dr. Everlene Farrier at [facility]. No fracture seen. There are degenerative changes of the shoulder  joint. Multiple calcific densities and calcified granulomas are seen in the lung.   ASSESSMENT/PLAN: No fracture seen in the shoulder. He has chronic lung disease. He did not exhibit any tenderness over the lumbar spine. There is a mild endplate compression fracture but no significant changes here. I told him he could take a maximum of 4 extra strength Tylenol per day.I personally performed the services described in this documentation, which was scribed in my presence. The recorded information has been reviewed and is accurate.    Gross sideeffects, risk and benefits, and alternatives of medications d/w patient. Patient is aware that all medications have potential sideeffects and we are unable to predict every sideeffect or drug-drug interaction that may occur.  Arlyss Queen MD 03/05/2015 9:21 AM

## 2015-03-05 NOTE — Progress Notes (Signed)
    MRN: [id number] DOB: June 26, 1921  Subjective:   [name] is a 81 y.o. male presenting for follow up on left forearm abrasion. Patient was seen here initially on 03/01/2015, wound care provided, mupirocin applied and recommended daily dressing changes until f/u today. Patient is here with his son who states that he lost the mupirocin cream and has only changed his dressing once. He denies fever, pain, redness, swelling, decreased ROM, numbness or tingling of hand.   [name] has a current medication list which includes the following prescription(s): baclofen, besifloxacin hcl, esomeprazole, feeding supplement (ensure complete), furosemide, ketorolac, lisinopril-hydrochlorothiazide, methylcellulose, metoprolol succinate, viteyes areds advanced, mupirocin ointment, probiotic product, simvastatin, and tamsulosin. Also is allergic to ciprofloxacin and penicillins.  [name]  has a past medical history of BARRETTS ESOPHAGUS (11/19/2007); CARDIOMYOPATHY (08/02/2008); CHANGE IN BOWELS (02/01/2009); CHF (08/10/2008); Diarrhea (01/31/2009); DIVERTICULOSIS, COLON (11/19/2007); GASTRITIS (11/19/2007); GERD (11/19/2007); HIATAL HERNIA (11/19/2007); HYPERLIPIDEMIA (06/27/2009); HYPOTHYROIDISM (06/27/2009); MELENA (08/01/2009); PERSONAL HX COLONIC POLYPS (02/01/2009); TINEA CORPORIS (06/27/2009); TOBACCO USE, QUIT (06/27/2009); TREMOR, ESSENTIAL (06/27/2009); HTN (hypertension); IBS (irritable bowel syndrome); Heart murmur; and Blood transfusion without reported diagnosis. Also  has past surgical history that includes Cholecystectomy; Lung surgery (1984); Umbilical hernia repair; Tonsillectomy; Vasectomy; Esophagogastroduodenoscopy (N/A, 04/28/2013); Tonsillectomy; Hernia repair; and Hip Arthroplasty (Left, 11/30/2013).  Objective:   Vitals: BP 124/66 mmHg  Pulse 88  Temp(Src) 98.2 F (36.8 C) (Oral)  Resp 14  Ht 5' 5.5" (1.664 m)  Wt 153 lb (69.4 kg)  BMI 25.06 kg/m2  SpO2 94%  Physical Exam  Musculoskeletal:       Left  forearm: He exhibits no tenderness, no bony tenderness, no swelling, no edema, no deformity and no laceration.       Arms:  WOUND CARE: dressing removed, wound irrigated with 50cc of sterile water. Patted dry, mupirocin applied, dressed with non-adherent gauze and [place].  Assessment and Plan :   1. Abrasion - Healing as anticipated, reviewed daily dressing changes with patient and his son. Patient will rtc in 1 week if no improvement or resolution of abrasion not achieved.  Jaynee Eagles, PA-C [facility] [phone] 03/05/2015 9:14 AM

## 2015-03-14 ENCOUNTER — Telehealth: Payer: Self-pay

## 2015-03-14 ENCOUNTER — Telehealth: Payer: Self-pay | Admitting: Internal Medicine

## 2015-03-14 NOTE — Telephone Encounter (Signed)
Dr Jacqulyn Bath requesting a stronger pain medication for his fractured spine.   [facility]  [address]    [phone]

## 2015-03-14 NOTE — Telephone Encounter (Signed)
Sick patient  Come in and see me on Wednesday. I am here from 8-3. We can talk about different pain medications to see if we can get him some relief at that time.

## 2015-03-14 NOTE — Telephone Encounter (Signed)
Left message for patient to call back  

## 2015-03-15 ENCOUNTER — Encounter (INDEPENDENT_AMBULATORY_CARE_PROVIDER_SITE_OTHER): Payer: Medicare Other | Admitting: Ophthalmology

## 2015-03-15 NOTE — Telephone Encounter (Signed)
I have spoken to patient who asks if Tylenol should be okay to use for pain. I advised that this should be fine (he has had several physicians tell him to take this medication recently for pain). Patient verbalizes understanding.

## 2015-03-15 NOTE — Telephone Encounter (Signed)
Advised son of the message. He will be here tomorrow at 8.

## 2015-03-16 ENCOUNTER — Other Ambulatory Visit: Payer: Self-pay | Admitting: Emergency Medicine

## 2015-03-16 ENCOUNTER — Ambulatory Visit (INDEPENDENT_AMBULATORY_CARE_PROVIDER_SITE_OTHER): Payer: Medicare Other | Admitting: Emergency Medicine

## 2015-03-16 ENCOUNTER — Ambulatory Visit (INDEPENDENT_AMBULATORY_CARE_PROVIDER_SITE_OTHER): Payer: Medicare Other

## 2015-03-16 VITALS — BP 128/60 | HR 67 | Temp 97.8°F | Resp 12 | Ht 65.5 in | Wt 147.0 lb

## 2015-03-16 DIAGNOSIS — S32020G Wedge compression fracture of second lumbar vertebra, subsequent encounter for fracture with delayed healing: Secondary | ICD-10-CM

## 2015-03-16 DIAGNOSIS — M545 Low back pain, unspecified: Secondary | ICD-10-CM

## 2015-03-16 DIAGNOSIS — T148XXA Other injury of unspecified body region, initial encounter: Secondary | ICD-10-CM

## 2015-03-16 DIAGNOSIS — S32000A Wedge compression fracture of unspecified lumbar vertebra, initial encounter for closed fracture: Secondary | ICD-10-CM | POA: Diagnosis not present

## 2015-03-16 DIAGNOSIS — S32020A Wedge compression fracture of second lumbar vertebra, initial encounter for closed fracture: Secondary | ICD-10-CM | POA: Insufficient documentation

## 2015-03-16 LAB — POCT CBC
Granulocyte percent: 84.6 %G — AB (ref 37–80)
HCT, POC: 43 % — AB (ref 43.5–53.7)
HEMOGLOBIN: 14.9 g/dL (ref 14.1–18.1)
LYMPH, POC: 1.2 (ref 0.6–3.4)
MCH, POC: 29.9 pg (ref 27–31.2)
MCHC: 34.8 g/dL (ref 31.8–35.4)
MCV: 85.9 fL (ref 80–97)
MID (CBC): 0.5 (ref 0–0.9)
MPV: 8.1 fL (ref 0–99.8)
PLATELET COUNT, POC: 295 10*3/uL (ref 142–424)
POC Granulocyte: 9.2 — AB (ref 2–6.9)
POC LYMPH PERCENT: 10.9 %L (ref 10–50)
POC MID %: 4.5 % (ref 0–12)
RBC: 5 M/uL (ref 4.69–6.13)
RDW, POC: 14 %
WBC: 10.9 10*3/uL — AB (ref 4.6–10.2)

## 2015-03-16 LAB — COMPLETE METABOLIC PANEL WITH GFR
ALT: 9 U/L (ref 9–46)
AST: 11 U/L (ref 10–35)
Albumin: 3.8 g/dL (ref 3.6–5.1)
Alkaline Phosphatase: 180 U/L — ABNORMAL HIGH (ref 40–115)
BILIRUBIN TOTAL: 0.8 mg/dL (ref 0.2–1.2)
BUN: 27 mg/dL — AB (ref 7–25)
CHLORIDE: 99 mmol/L (ref 98–110)
CO2: 27 mmol/L (ref 20–31)
CREATININE: 1.2 mg/dL — AB (ref 0.70–1.11)
Calcium: 9.5 mg/dL (ref 8.6–10.3)
GFR, EST AFRICAN AMERICAN: 61 mL/min (ref >=60)
GFR, Est Non African American: 53 mL/min — ABNORMAL LOW (ref >=60)
Glucose, Bld: 103 mg/dL — ABNORMAL HIGH (ref 65–99)
Potassium: 4.7 mmol/L (ref 3.5–5.3)
Sodium: 135 mmol/L (ref 135–146)
TOTAL PROTEIN: 6.7 g/dL (ref 6.1–8.1)

## 2015-03-16 MED ORDER — CALCITONIN (SALMON) 200 UNIT/ACT NA SOLN: 1.0000 | Freq: Every day | NASAL | Status: DC

## 2015-03-16 MED ORDER — HYDROCODONE-ACETAMINOPHEN 5-325 MG PO TABS: ORAL_TABLET | ORAL | Status: DC

## 2015-03-16 NOTE — Progress Notes (Addendum)
Patient ID: [name], male   DOB: Oct 25, 1921, 81 y.o.   MRN: [id number]    By signing my name below, I, Essence Howell, attest that this documentation has been prepared under the direction and in the presence of Darlyne Russian, MD Electronically Signed: Ladene Artist, ED Scribe 03/16/2015 at 9:18 AM.  Chief Complaint:  Chief Complaint  Patient presents with  . Follow-up  . Back Pain   HPI: CALEEL JUNGERS is a 81 y.o. male who reports to [facility] today for a follow-up regarding back pain. Pt was last seen in the office on 03/05/15 for a follow-up on unchanged back pain onset 4 days prior secondary to a fall. XRs on 03/01/15 showed degenerative changes with a mild scoliosis, stable anterolisthesis of L4 on L5, and mild superior endplate compression fracture at L2, new since April 2015. Pt's son called 2 days ago and stated that he wanted stronger pain medications. Pt was advised to come into the office to discuss pain medication. Today, pt states that he is still having constant back pain. He has tried patches and taking 2 Tylenol tablets every 3 hours due to pain. Pt currently ambulates with a rolling walker. Pt is concerned that something has been overlooked on his back XR and requests more imaging.   Wound  Pt also presents for a follow-up on a wound on the left forearm. He has applied gauze to the area. No active bleeding.   Past Medical History  Diagnosis Date  . BARRETTS ESOPHAGUS 11/19/2007  . CARDIOMYOPATHY 08/02/2008  . CHANGE IN BOWELS 02/01/2009  . CHF 08/10/2008  . Diarrhea 01/31/2009  . DIVERTICULOSIS, COLON 11/19/2007  . GASTRITIS 11/19/2007  . GERD 11/19/2007  . HIATAL HERNIA 11/19/2007  . HYPERLIPIDEMIA 06/27/2009  . HYPOTHYROIDISM 06/27/2009  . MELENA 08/01/2009  . PERSONAL HX COLONIC POLYPS 02/01/2009  . TINEA CORPORIS 06/27/2009  . TOBACCO USE, QUIT 06/27/2009  . TREMOR, ESSENTIAL 06/27/2009  . HTN (hypertension)   . IBS (irritable bowel syndrome)   . Heart murmur   . Blood  transfusion without reported diagnosis    Past Surgical History  Procedure Laterality Date  . Cholecystectomy    . Lung surgery  1984  . Umbilical hernia repair    . Tonsillectomy    . Vasectomy    . Esophagogastroduodenoscopy N/A 04/28/2013    Procedure: ESOPHAGOGASTRODUODENOSCOPY (EGD);  Surgeon: Jerene Bears, MD;  Location: [facility];  Service: Gastroenterology;  Laterality: N/A;  . Tonsillectomy    . Hernia repair    . Hip arthroplasty Left 11/30/2013    Procedure: ARTHROPLASTY BIPOLAR HIP;  Surgeon: [name], MD;  Location: [facility];  Service: Orthopedics;  Laterality: Left;   Social History   Social History  . Marital Status: Widowed    Spouse Name: N/A  . Number of Children: N/A  . Years of Education: N/A   Occupational History  . retired Clinical biochemist    Social History Main Topics  . Smoking status: Former Smoker -- 3.00 packs/day for 24 years    Types: Cigarettes    Quit date: 03/27/1943  . Smokeless tobacco: Never Used     Comment: Widowed x 2, has lady friend  . Alcohol Use: 0.6 oz/week    1 Standard drinks or equivalent per week     Comment: occassional  . Drug Use: No  . Sexual Activity: No   Other Topics Concern  . None   Social History Narrative   Family History  Problem  Relation Age of Onset  . Heart disease Mother   . Colon cancer Father   . Prostate cancer Brother    Allergies  Allergen Reactions  . Ciprofloxacin Other (See Comments)    Questionable allergy as pt reports taking med without difficulty for resp infection  . Penicillins Rash   Prior to Admission medications   Medication Sig Start Date End Date Taking? Authorizing Provider  baclofen (LIORESAL) 10 MG tablet Take 0.5 tablets (5 mg total) by mouth 3 (three) times daily. 12/10/14  Yes Jerene Bears, MD  Besifloxacin HCl 0.6 % SUSP Apply 1 drop to eye as directed.   Yes Historical Provider, MD  esomeprazole (NEXIUM) 40 MG capsule TAKE 1 CAPSULE BY MOUTH  EVERY DAY BEFORE FIRST MEAL  OF THE DAY 02/21/15  Yes Jerene Bears, MD  feeding supplement, ENSURE COMPLETE, (ENSURE COMPLETE) LIQD Take 237 mLs by mouth at bedtime. 07/14/13  Yes Annita Brod, MD  furosemide (LASIX) 20 MG tablet Take 1-2 tablets (20-40 mg total) by mouth daily. Take 20mg  one day then alternate with 40mg  the next day 06/28/14  Yes Fay Records, MD  ketorolac (ACULAR) 0.4 % SOLN Place 1 drop into the left eye as directed.   Yes Historical Provider, MD  lisinopril-hydrochlorothiazide (PRINZIDE,ZESTORETIC) 10-12.5 MG tablet Take 1 tablet by mouth  daily with breakfast 02/25/15  Yes Fay Records, MD  methylcellulose (SOLUBLE FIBER THERAPY) oral powder Take 1 packet by mouth 4 (four) times daily.   Yes Historical Provider, MD  metoprolol succinate (TOPROL-XL) 25 MG 24 hr tablet Take 1 tablet by mouth  daily with breakfast 01/18/15  Yes Fay Records, MD  Multiple Vitamins-Minerals (VITEYES AREDS ADVANCED) CAPS Take 1 capsule by mouth 2 (two) times daily.   Yes Historical Provider, MD  mupirocin ointment (BACTROBAN) 2 % Apply 1 application topically 3 (three) times daily. 03/01/15  Yes Bennett Scrape V, PA-C  Probiotic Product (PROBIOTIC DAILY PO) Take 1 tablet by mouth daily.   Yes Historical Provider, MD  simvastatin (ZOCOR) 20 MG tablet Take 1 tablet (20 mg total) by mouth daily. 06/28/14  Yes Fay Records, MD  Tamsulosin HCl (FLOMAX) 0.4 MG CAPS Take 0.4 mg by mouth daily.    Yes Historical Provider, MD    ROS: The patient denies fevers, chills, night sweats, unintentional weight loss, chest pain, palpitations, wheezing, dyspnea on exertion, nausea, vomiting, abdominal pain, dysuria, hematuria, melena, numbness, weakness, or tingling. +back pain, +wound  All other systems have been reviewed and were otherwise negative with the exception of those mentioned in the HPI and as above.    PHYSICAL EXAM: Filed Vitals:   03/16/15 0848  BP: 128/60  Pulse: 67  Temp: 97.8 F (36.6 C)  Resp: 12   Body mass index is 24.08  kg/(m^2).  General: Alert, no acute distress. Very hard of hearing.  HEENT:  Normocephalic, atraumatic, oropharynx patent. Eye: [facility]te Mangle [facility] Cardiovascular:  Regular rate and rhythm, no rubs murmurs or gallops.  No Carotid bruits, radial pulse intact. No pedal edema.  Respiratory: Clear to auscultation bilaterally.  No wheezes, rales, or rhonchi.  No cyanosis, no use of accessory musculature Abdominal: No organomegaly, abdomen is soft and non-tender, positive bowel sounds.  No masses. Musculoskeletal: Gait intact. No edema. Minimal tenderness over the lower back. No apparent weakness of the LE. Skin: No rashes. Healed abrasion on the L forearm.  Neurologic: Facial musculature symmetric. Psychiatric: Patient acts appropriately throughout our interaction. Lymphatic: No cervical or  submandibular lymphadenopathy  LABS:  EKG/XRAY:   Primary read interpreted by Dr. Everlene Farrier at [place] is been significant progression in the compression fracture at L2. The previously noted spondylolisthesis is stable.  ASSESSMENT/PLAN:  There is significant worsening compression at L2. I do think a brace would be in order. We'll also have him see Dr. Rolena Infante.CT of the lumbar spine is ordered.i did start him on Miacalcin nasal spray.he has taken hydrocodone for pain before. He was given a limited prescription of this. I spoke with Dr. Rolena Infante nurse. Patient will be able to see Ms. Ronette Deter tomorrow at 2:00 who works closely with Dr. Rolena Infante.I personally performed the services described in this documentation, which was scribed in my presence. The recorded information has been reviewed and is accurate.   Gross sideeffects, risk and benefits, and alternatives of medications d/w patient. Patient is aware that all medications have potential sideeffects and we are unable to predict every sideeffect or drug-drug interaction that may occur.  Arlyss Queen MD 03/16/2015 9:08 AM    Because he has an appointment at 1:30   At [facility] peaks C and Ms. Mayo

## 2015-03-16 NOTE — Patient Instructions (Signed)
You have an appt tomorrow for outpatient CT Lumbar Spine at [place] appt is scheduled for 9:00am and you will need to arrive at 8:45am. You will register at Radiology. You will go in at main entrance and it will fork to the right and the left, you will go to the right and at the first intersection you come to take another right, radiology will be there on the left. You can ask the front desk how to get to get to radiology.   [facility]  [place] Ph# [phone]  You also have appt at [place] at 1:30pm to see Ms. Mayo, who works with Dr. Rolena Infante.   [place] [address] Ph# [phone]

## 2015-03-17 ENCOUNTER — Ambulatory Visit (HOSPITAL_COMMUNITY)
Admission: RE | Admit: 2015-03-17 | Discharge: 2015-03-17 | Disposition: A | Discharge status: Home / Self Care | Payer: Medicare Other | Source: Ambulatory Visit | Attending: Emergency Medicine | Admitting: Emergency Medicine

## 2015-03-17 ENCOUNTER — Telehealth: Payer: Self-pay | Admitting: Emergency Medicine

## 2015-03-17 ENCOUNTER — Other Ambulatory Visit: Payer: Self-pay | Admitting: Emergency Medicine

## 2015-03-17 DIAGNOSIS — S32000A Wedge compression fracture of unspecified lumbar vertebra, initial encounter for closed fracture: Secondary | ICD-10-CM

## 2015-03-17 DIAGNOSIS — M545 Low back pain, unspecified: Secondary | ICD-10-CM

## 2015-03-17 DIAGNOSIS — M4854XA Collapsed vertebra, not elsewhere classified, thoracic region, initial encounter for fracture: Secondary | ICD-10-CM | POA: Diagnosis not present

## 2015-03-17 DIAGNOSIS — M4856XA Collapsed vertebra, not elsewhere classified, lumbar region, initial encounter for fracture: Secondary | ICD-10-CM | POA: Diagnosis not present

## 2015-03-17 DIAGNOSIS — M419 Scoliosis, unspecified: Secondary | ICD-10-CM | POA: Insufficient documentation

## 2015-03-17 DIAGNOSIS — I7789 Other specified disorders of arteries and arterioles: Secondary | ICD-10-CM

## 2015-03-17 DIAGNOSIS — S32020G Wedge compression fracture of second lumbar vertebra, subsequent encounter for fracture with delayed healing: Secondary | ICD-10-CM

## 2015-03-17 DIAGNOSIS — S32020A Wedge compression fracture of second lumbar vertebra, initial encounter for closed fracture: Secondary | ICD-10-CM | POA: Diagnosis not present

## 2015-03-17 DIAGNOSIS — M5136 Other intervertebral disc degeneration, lumbar region: Secondary | ICD-10-CM | POA: Diagnosis not present

## 2015-03-17 NOTE — Telephone Encounter (Signed)
Unable to complete phone message.

## 2015-03-18 ENCOUNTER — Encounter (INDEPENDENT_AMBULATORY_CARE_PROVIDER_SITE_OTHER): Payer: Medicare Other | Admitting: Ophthalmology

## 2015-03-18 DIAGNOSIS — S32029A Unspecified fracture of second lumbar vertebra, initial encounter for closed fracture: Secondary | ICD-10-CM | POA: Diagnosis not present

## 2015-03-21 ENCOUNTER — Telehealth: Payer: Self-pay | Admitting: Family Medicine

## 2015-03-21 NOTE — Telephone Encounter (Signed)
Spoke with patient son to remind him of Fathers appt

## 2015-03-23 LAB — PROTEIN ELECTROPHORESIS, SERUM
ALPHA-1-GLOBULIN: 0.4 g/dL — AB (ref 0.2–0.3)
Albumin ELP: 3.6 g/dL — ABNORMAL LOW (ref 3.8–4.8)
Alpha-2-Globulin: 0.9 g/dL (ref 0.5–0.9)
BETA 2: 0.3 g/dL (ref 0.2–0.5)
Beta Globulin: 0.5 g/dL (ref 0.4–0.6)
GAMMA GLOBULIN: 0.9 g/dL (ref 0.8–1.7)
Total Protein, Serum Electrophoresis: 6.6 g/dL (ref 6.1–8.1)

## 2015-03-23 LAB — IMMUNOFIXATION ELECTROPHORESIS
IgA: 310 mg/dL (ref 68–379)
IgG (Immunoglobin G), Serum: 1070 mg/dL (ref 650–1600)
IgM, Serum: 48 mg/dL (ref 41–251)
TOTAL PROTEIN, SERUM ELECTROPHOR: 6.6 g/dL (ref 6.0–8.3)

## 2015-03-24 ENCOUNTER — Ambulatory Visit: Payer: Medicare Other | Admitting: Emergency Medicine

## 2015-03-25 ENCOUNTER — Encounter: Payer: Self-pay | Admitting: Family Medicine

## 2015-03-30 ENCOUNTER — Encounter (INDEPENDENT_AMBULATORY_CARE_PROVIDER_SITE_OTHER): Payer: Medicare Other | Admitting: Ophthalmology

## 2015-03-31 ENCOUNTER — Ambulatory Visit (INDEPENDENT_AMBULATORY_CARE_PROVIDER_SITE_OTHER): Payer: Medicare Other

## 2015-03-31 ENCOUNTER — Ambulatory Visit (INDEPENDENT_AMBULATORY_CARE_PROVIDER_SITE_OTHER): Payer: Medicare Other | Admitting: Emergency Medicine

## 2015-03-31 ENCOUNTER — Encounter: Payer: Self-pay | Admitting: Emergency Medicine

## 2015-03-31 VITALS — BP 104/68 | HR 90 | Temp 97.7°F | Resp 18 | Ht 66.0 in | Wt 143.0 lb

## 2015-03-31 DIAGNOSIS — S32020G Wedge compression fracture of second lumbar vertebra, subsequent encounter for fracture with delayed healing: Secondary | ICD-10-CM

## 2015-03-31 MED ORDER — HYDROCODONE-ACETAMINOPHEN 5-325 MG PO TABS: ORAL_TABLET | ORAL | Status: DC

## 2015-03-31 MED ORDER — CALCITONIN (SALMON) 200 UNIT/ACT NA SOLN: 1.0000 | Freq: Every day | NASAL | Status: DC

## 2015-03-31 NOTE — Progress Notes (Signed)
Patient ID: [name], male   DOB: [birth year], 82 y.o.   MRN: [id number]     By signing my name below, I, Zola Button, attest that this documentation has been prepared under the direction and in the presence of Arlyss Queen, MD.  Electronically Signed: Zola Button, Medical Scribe. 03/31/2015. 10:46 AM.   Chief Complaint:  Chief Complaint  Patient presents with  . Follow-up  . Back Pain  . Fall  . Tremors    lower body    HPI: [name] is a 82 y.o. male with a history of CKD who reports to [facility] today for a follow-up. Patient had a fall 1 month ago and has had some lower back pain since then. XR imaging then showed a compression fracture at L2. The pain has improved some since then and notes that he does not always need to take the pain medication. He does request a refill of his pain medications. His appointment with the back doctor is in about 3 weeks. His son has been thinking about putting him in assisted living or skilled nursing care, but also allow his father to stay with him. The patient currently stays at [facility]  Patient also reports having tremors in his bilateral hands, worse in his right hand. He notices this when using utensils while eating.  Patient states he had an episode of abdominal pain last night. He denies any abdominal pain currently.  Past Medical History  Diagnosis Date  . BARRETTS ESOPHAGUS 11/19/2007  . CARDIOMYOPATHY 08/02/2008  . CHANGE IN BOWELS 02/01/2009  . CHF 08/10/2008  . Diarrhea 01/31/2009  . DIVERTICULOSIS, COLON 11/19/2007  . GASTRITIS 11/19/2007  . GERD 11/19/2007  . HIATAL HERNIA 11/19/2007  . HYPERLIPIDEMIA 06/27/2009  . HYPOTHYROIDISM 06/27/2009  . MELENA 08/01/2009  . PERSONAL HX COLONIC POLYPS 02/01/2009  . TINEA CORPORIS 06/27/2009  . TOBACCO USE, QUIT 06/27/2009  . TREMOR, ESSENTIAL 06/27/2009  . HTN (hypertension)   . IBS (irritable bowel syndrome)   . Heart murmur   . Blood transfusion without reported diagnosis    Past  Surgical History  Procedure Laterality Date  . Cholecystectomy    . Lung surgery  1984  . Umbilical hernia repair    . Tonsillectomy    . Vasectomy    . Esophagogastroduodenoscopy N/A 04/28/2013    Procedure: ESOPHAGOGASTRODUODENOSCOPY (EGD);  Surgeon: Jerene Bears, MD;  Location: [facility];  Service: Gastroenterology;  Laterality: N/A;  . Tonsillectomy    . Hernia repair    . Hip arthroplasty Left 11/30/2013    Procedure: ARTHROPLASTY BIPOLAR HIP;  Surgeon: [name], MD;  Location: [facility];  Service: Orthopedics;  Laterality: Left;   Social History   Social History  . Marital Status: Widowed    Spouse Name: N/A  . Number of Children: N/A  . Years of Education: N/A   Occupational History  . retired Clinical biochemist    Social History Main Topics  . Smoking status: Former Smoker -- 3.00 packs/day for 24 years    Types: Cigarettes    Quit date: 03/27/1943  . Smokeless tobacco: Never Used     Comment: Widowed x 2, has lady friend  . Alcohol Use: 0.6 oz/week    1 Standard drinks or equivalent per week     Comment: occassional  . Drug Use: No  . Sexual Activity: No   Other Topics Concern  . None   Social History Narrative   Family History  Problem Relation Age of  Onset  . Heart disease Mother   . Colon cancer Father   . Prostate cancer Brother    Allergies  Allergen Reactions  . Ciprofloxacin Other (See Comments)    Questionable allergy as pt reports taking med without difficulty for resp infection  . Penicillins Rash   Prior to Admission medications   Medication Sig Start Date End Date Taking? Authorizing Provider  baclofen (LIORESAL) 10 MG tablet Take 0.5 tablets (5 mg total) by mouth 3 (three) times daily. 12/10/14  Yes Jerene Bears, MD  Besifloxacin HCl 0.6 % SUSP Apply 1 drop to eye as directed.   Yes Historical Provider, MD  calcitonin, salmon, (MIACALCIN/FORTICAL) 200 UNIT/ACT nasal spray Place 1 spray into alternate nostrils daily. 03/16/15  Yes Darlyne Russian, MD  esomeprazole (NEXIUM) 40 MG capsule TAKE 1 CAPSULE BY MOUTH  EVERY DAY BEFORE FIRST MEAL OF THE DAY 02/21/15  Yes Jerene Bears, MD  feeding supplement, ENSURE COMPLETE, (ENSURE COMPLETE) LIQD Take 237 mLs by mouth at bedtime. 07/14/13  Yes Annita Brod, MD  furosemide (LASIX) 20 MG tablet Take 1-2 tablets (20-40 mg total) by mouth daily. Take 20mg  one day then alternate with 40mg  the next day 06/28/14  Yes Fay Records, MD  HYDROcodone-acetaminophen [facility]) 5-325 MG tablet Take one half tablet every 6 hours as needed for back pain 03/16/15  Yes Darlyne Russian, MD  ketorolac (ACULAR) 0.4 % SOLN Place 1 drop into the left eye as directed.   Yes Historical Provider, MD  lisinopril-hydrochlorothiazide (PRINZIDE,ZESTORETIC) 10-12.5 MG tablet Take 1 tablet by mouth  daily with breakfast 02/25/15  Yes Fay Records, MD  methylcellulose (SOLUBLE FIBER THERAPY) oral powder Take 1 packet by mouth 4 (four) times daily.   Yes Historical Provider, MD  metoprolol succinate (TOPROL-XL) 25 MG 24 hr tablet Take 1 tablet by mouth  daily with breakfast 01/18/15  Yes Fay Records, MD  Multiple Vitamins-Minerals (VITEYES AREDS ADVANCED) CAPS Take 1 capsule by mouth 2 (two) times daily.   Yes Historical Provider, MD  Probiotic Product (PROBIOTIC DAILY PO) Take 1 tablet by mouth daily.   Yes Historical Provider, MD  simvastatin (ZOCOR) 20 MG tablet Take 1 tablet (20 mg total) by mouth daily. 06/28/14  Yes Fay Records, MD  Tamsulosin HCl (FLOMAX) 0.4 MG CAPS Take 0.4 mg by mouth daily.    Yes Historical Provider, MD     ROS: The patient denies fevers, chills, night sweats, unintentional weight loss, chest pain, palpitations, wheezing, dyspnea on exertion, nausea, vomiting, dysuria, hematuria, melena, numbness, or tingling.   All other systems have been reviewed and were otherwise negative with the exception of those mentioned in the HPI and as above.    PHYSICAL EXAM: Filed Vitals:   03/31/15 1007  BP: 104/68   Pulse: 90  Temp: 97.7 F (36.5 C)  Resp: 18   Body mass index is 23.09 kg/(m^2).   General: Alert, no acute distress. Hard of hearing. HEENT:  Normocephalic, atraumatic, oropharynx patent. Eye: [facility] M[facility] Cardiovascular:  Regular rate and rhythm. Respiratory: Few dry rales, left base. Abdominal: Soft, no tenderness. Musculoskeletal: Gait intact. No edema, tenderness Skin: No rashes. Neurologic: No focal weakness in the legs. Psychiatric: Patient acts appropriately throughout our interaction. Lymphatic: No cervical or submandibular lymphadenopathy     LABS:    EKG/XRAY:   Primary read interpreted by Dr. Everlene Farrier at [facility]. T12, L2 compression fractures are stable. Lower lumbar spondylolisthesis also stable. Fracture line is visible  through the vertebral body of L2.   ASSESSMENT/PLAN: Fractures are stable. He will stay on calcitonin nasal spray and hydrocodone. He has a follow-up visit with the orthopedic specialist later this month. He will continue wearing his brace. Recheck 6 weeks.    Gross sideeffects, risk and benefits, and alternatives of medications d/w patient. Patient is aware that all medications have potential sideeffects and we are unable to predict every sideeffect or drug-drug interaction that may occur.  Arlyss Queen MD 03/31/2015 10:46 AM

## 2015-04-05 ENCOUNTER — Encounter: Payer: Medicare Other | Admitting: Physician Assistant

## 2015-04-08 ENCOUNTER — Other Ambulatory Visit: Payer: Self-pay | Admitting: Internal Medicine

## 2015-04-08 ENCOUNTER — Telehealth: Payer: Self-pay

## 2015-04-08 NOTE — Telephone Encounter (Signed)
Pt's son called about labs. Notified. Looks like they came in to recheck with Dr. Everlene Farrier on 03/31/15

## 2015-04-15 DIAGNOSIS — M4854XD Collapsed vertebra, not elsewhere classified, thoracic region, subsequent encounter for fracture with routine healing: Secondary | ICD-10-CM | POA: Diagnosis not present

## 2015-04-15 DIAGNOSIS — M7062 Trochanteric bursitis, left hip: Secondary | ICD-10-CM | POA: Diagnosis not present

## 2015-04-15 DIAGNOSIS — S32020D Wedge compression fracture of second lumbar vertebra, subsequent encounter for fracture with routine healing: Secondary | ICD-10-CM | POA: Diagnosis not present

## 2015-04-27 ENCOUNTER — Other Ambulatory Visit: Payer: Self-pay | Admitting: Internal Medicine

## 2015-05-03 ENCOUNTER — Telehealth: Payer: Self-pay | Admitting: Internal Medicine

## 2015-05-03 MED ORDER — OMEPRAZOLE 40 MG PO CPDR: 40.0000 mg | DELAYED_RELEASE_CAPSULE | Freq: Every day | ORAL | Status: DC

## 2015-05-03 NOTE — Telephone Encounter (Signed)
Pts son states that the nexium 40mg  is expensive. Wants to know if he can take the OTC nexium 20mg  and take 2 of them or is there something cheaper that can be prescribed for him. Please advise.

## 2015-05-03 NOTE — Telephone Encounter (Signed)
Or, if there is anything else less expensive that can be prescribed.

## 2015-05-03 NOTE — Telephone Encounter (Signed)
Per Dr. Hilarie Fredrickson pt may try omeprazole 40mg  daily. Script sent to pharmacy and son notified.

## 2015-05-13 DIAGNOSIS — M4854XD Collapsed vertebra, not elsewhere classified, thoracic region, subsequent encounter for fracture with routine healing: Secondary | ICD-10-CM | POA: Diagnosis not present

## 2015-05-13 DIAGNOSIS — S32020D Wedge compression fracture of second lumbar vertebra, subsequent encounter for fracture with routine healing: Secondary | ICD-10-CM | POA: Diagnosis not present

## 2015-05-16 ENCOUNTER — Encounter (INDEPENDENT_AMBULATORY_CARE_PROVIDER_SITE_OTHER): Payer: Medicare Other | Admitting: Ophthalmology

## 2015-05-16 DIAGNOSIS — H43813 Vitreous degeneration, bilateral: Secondary | ICD-10-CM

## 2015-05-16 DIAGNOSIS — I10 Essential (primary) hypertension: Secondary | ICD-10-CM

## 2015-05-16 DIAGNOSIS — H35033 Hypertensive retinopathy, bilateral: Secondary | ICD-10-CM | POA: Diagnosis not present

## 2015-05-16 DIAGNOSIS — H353112 Nonexudative age-related macular degeneration, right eye, intermediate dry stage: Secondary | ICD-10-CM | POA: Diagnosis not present

## 2015-05-16 DIAGNOSIS — H353221 Exudative age-related macular degeneration, left eye, with active choroidal neovascularization: Secondary | ICD-10-CM | POA: Diagnosis not present

## 2015-06-20 ENCOUNTER — Encounter (INDEPENDENT_AMBULATORY_CARE_PROVIDER_SITE_OTHER): Payer: Medicare Other | Admitting: Ophthalmology

## 2015-06-20 DIAGNOSIS — I10 Essential (primary) hypertension: Secondary | ICD-10-CM | POA: Diagnosis not present

## 2015-06-20 DIAGNOSIS — H35033 Hypertensive retinopathy, bilateral: Secondary | ICD-10-CM

## 2015-06-20 DIAGNOSIS — H353112 Nonexudative age-related macular degeneration, right eye, intermediate dry stage: Secondary | ICD-10-CM | POA: Diagnosis not present

## 2015-06-20 DIAGNOSIS — H43813 Vitreous degeneration, bilateral: Secondary | ICD-10-CM

## 2015-06-20 DIAGNOSIS — H353221 Exudative age-related macular degeneration, left eye, with active choroidal neovascularization: Secondary | ICD-10-CM | POA: Diagnosis not present

## 2015-06-22 ENCOUNTER — Ambulatory Visit (INDEPENDENT_AMBULATORY_CARE_PROVIDER_SITE_OTHER): Payer: Medicare Other | Admitting: Emergency Medicine

## 2015-06-22 ENCOUNTER — Ambulatory Visit (INDEPENDENT_AMBULATORY_CARE_PROVIDER_SITE_OTHER): Payer: Medicare Other

## 2015-06-22 VITALS — BP 130/60 | HR 60 | Temp 97.7°F | Resp 16 | Ht 66.0 in | Wt 143.2 lb

## 2015-06-22 DIAGNOSIS — S79912A Unspecified injury of left hip, initial encounter: Secondary | ICD-10-CM | POA: Diagnosis not present

## 2015-06-22 DIAGNOSIS — M25552 Pain in left hip: Secondary | ICD-10-CM | POA: Diagnosis not present

## 2015-06-22 NOTE — Progress Notes (Addendum)
By signing my name below, I, Moises Blood, attest that this documentation has been prepared under the direction and in the presence of Arlyss Queen, MD. Electronically Signed: Moises Blood, Jeannette. 06/22/2015 , 12:52 PM .  Patient was seen in room 11 .  Chief Complaint:  Chief Complaint  Patient presents with  . Follow-up    hip pain, pt not feeling better    HPI: [name] is a 82 y.o. male who reports to [facility] today for follow up on hip pain. Patient was seen by the orthopedics and they took xray of his hip but reported that no fractures seen. He states that he still has hip pain. He is able to walk around his house without his walker but when he needs to walk more, he brings his walker with him.   He says the pain in his back has resolved.   Past Medical History  Diagnosis Date  . BARRETTS ESOPHAGUS 11/19/2007  . CARDIOMYOPATHY 08/02/2008  . CHANGE IN BOWELS 02/01/2009  . CHF 08/10/2008  . Diarrhea 01/31/2009  . DIVERTICULOSIS, COLON 11/19/2007  . GASTRITIS 11/19/2007  . GERD 11/19/2007  . HIATAL HERNIA 11/19/2007  . HYPERLIPIDEMIA 06/27/2009  . HYPOTHYROIDISM 06/27/2009  . MELENA 08/01/2009  . PERSONAL HX COLONIC POLYPS 02/01/2009  . TINEA CORPORIS 06/27/2009  . TOBACCO USE, QUIT 06/27/2009  . TREMOR, ESSENTIAL 06/27/2009  . HTN (hypertension)   . IBS (irritable bowel syndrome)   . Heart murmur   . Blood transfusion without reported diagnosis    Past Surgical History  Procedure Laterality Date  . Cholecystectomy    . Lung surgery  1984  . Umbilical hernia repair    . Tonsillectomy    . Vasectomy    . Esophagogastroduodenoscopy N/A 04/28/2013    Procedure: ESOPHAGOGASTRODUODENOSCOPY (EGD);  Surgeon: Jerene Bears, MD;  Location: [facility];  Service: Gastroenterology;  Laterality: N/A;  . Tonsillectomy    . Hernia repair    . Hip arthroplasty Left 11/30/2013    Procedure: ARTHROPLASTY BIPOLAR HIP;  Surgeon: [name], MD;  Location: [facility];  Service: Orthopedics;   Laterality: Left;   Social History   Social History  . Marital Status: Widowed    Spouse Name: N/A  . Number of Children: N/A  . Years of Education: N/A   Occupational History  . retired Clinical biochemist    Social History Main Topics  . Smoking status: Former Smoker -- 3.00 packs/day for 24 years    Types: Cigarettes    Quit date: 03/27/1943  . Smokeless tobacco: Never Used     Comment: Widowed x 2, has lady friend  . Alcohol Use: 0.6 oz/week    1 Standard drinks or equivalent per week     Comment: occassional  . Drug Use: No  . Sexual Activity: No   Other Topics Concern  . None   Social History Narrative   Family History  Problem Relation Age of Onset  . Heart disease Mother   . Colon cancer Father   . Prostate cancer Brother    Allergies  Allergen Reactions  . Ciprofloxacin Other (See Comments)    Questionable allergy as pt reports taking med without difficulty for resp infection  . Penicillins Rash   Prior to Admission medications   Medication Sig Start Date End Date Taking? Authorizing Provider  baclofen (LIORESAL) 10 MG tablet TAKE ONE-HALF TABLET BY MOUTH THREE TIMES DAILY 04/27/15  Yes Jerene Bears, MD  Besifloxacin HCl 0.6 %  SUSP Apply 1 drop to eye as directed.   Yes Historical Provider, MD  esomeprazole (NEXIUM) 40 MG capsule TAKE 1 CAPSULE BY MOUTH  EVERY DAY BEFORE FIRST MEAL OF THE DAY 02/21/15  Yes Jerene Bears, MD  furosemide (LASIX) 20 MG tablet Take 1-2 tablets (20-40 mg total) by mouth daily. Take 20mg  one day then alternate with 40mg  the next day 06/28/14  Yes Fay Records, MD  ketorolac (ACULAR) 0.4 % SOLN Place 1 drop into the left eye as directed.   Yes Historical Provider, MD  lisinopril-hydrochlorothiazide (PRINZIDE,ZESTORETIC) 10-12.5 MG tablet Take 1 tablet by mouth  daily with breakfast 02/25/15  Yes Fay Records, MD  metoprolol succinate (TOPROL-XL) 25 MG 24 hr tablet Take 1 tablet by mouth  daily with breakfast 01/18/15  Yes Fay Records, MD    Multiple Vitamins-Minerals (VITEYES AREDS ADVANCED) CAPS Take 1 capsule by mouth 2 (two) times daily.   Yes Historical Provider, MD  omeprazole (PRILOSEC) 40 MG capsule Take 1 capsule (40 mg total) by mouth daily. 05/03/15  Yes Jerene Bears, MD  simvastatin (ZOCOR) 20 MG tablet Take 1 tablet (20 mg total) by mouth daily. 06/28/14  Yes Fay Records, MD  simvastatin (ZOCOR) 20 MG tablet Take 1 tablet by mouth  daily 04/08/15  Yes Fay Records, MD  Tamsulosin HCl (FLOMAX) 0.4 MG CAPS Take 0.4 mg by mouth daily.    Yes Historical Provider, MD  calcitonin, salmon, (MIACALCIN/FORTICAL) 200 UNIT/ACT nasal spray Place 1 spray into alternate nostrils daily. Patient not taking: Reported on 06/22/2015 03/31/15   Darlyne Russian, MD  feeding supplement, ENSURE COMPLETE, (ENSURE COMPLETE) LIQD Take 237 mLs by mouth at bedtime. Patient not taking: Reported on 06/22/2015 07/14/13   Annita Brod, MD  HYDROcodone-acetaminophen [facility]) 5-325 MG tablet Take one half tablet every 6 hours as needed for back pain Patient not taking: Reported on 06/22/2015 03/31/15   Darlyne Russian, MD  methylcellulose (SOLUBLE FIBER THERAPY) oral powder Take 1 packet by mouth 4 (four) times daily. Reported on 06/22/2015    Historical Provider, MD  Probiotic Product (PROBIOTIC DAILY PO) Take 1 tablet by mouth daily. Reported on 06/22/2015    Historical Provider, MD     ROS:  Constitutional: negative for fever, chills, night sweats, weight changes, or fatigue  HEENT: negative for vision changes, hearing loss, congestion, rhinorrhea, ST, epistaxis, or sinus pressure Cardiovascular: negative for chest pain or palpitations Respiratory: negative for hemoptysis, wheezing, shortness of breath, or cough Abdominal: negative for abdominal pain, nausea, vomiting, diarrhea, or constipation Dermatological: negative for rash Musc: positive for hip pain (left) Neurologic: negative for headache, dizziness, or syncope All other systems reviewed and are  otherwise negative with the exception to those above and in the HPI.  PHYSICAL EXAM: Filed Vitals:   06/22/15 1214  BP: 130/60  Pulse: 60  Temp: 97.7 F (36.5 C)  Resp: 16   Body mass index is 23.12 kg/(m^2).   General: Alert, no acute distress HEENT:  Normocephalic, atraumatic, oropharynx patent. Eye: [facility] Cardiovascular:  Regular rate and rhythm, no rubs murmurs or gallops.  No Carotid bruits, radial pulse intact. No pedal edema.  Respiratory: Clear to auscultation bilaterally.  No wheezes, rales, or rhonchi.  No cyanosis, no use of accessory musculature Abdominal: No organomegaly, abdomen is soft and non-tender, positive bowel sounds. No masses. Musculoskeletal: Tenderness anterior left hip, no swelling, mild limitation in rotation Skin: No rashes. Neurologic: Facial musculature symmetric. Psychiatric: Patient  acts appropriately throughout our interaction.  Lymphatic: No cervical or submandibular lymphadenopathy Genitourinary/Anorectal: No acute findings  LABS:   EKG/XRAY:   Dg Hip Unilat W Or W/o Pelvis 2-3 Views Left  06/22/2015  CLINICAL DATA:  82 year old male with history of left hip pain after a fall. EXAM: DG HIP (WITH OR WITHOUT PELVIS) 2-3V LEFT COMPARISON:  11/02/2013. FINDINGS: Compared to the prior examination there has been placement of a left hip bipolar hemiarthroplasty. The femoral component of the prosthesis appears properly seated without periprosthetic fracture. The femoral head is properly located. Bony pelvis appears intact. Mild degenerative changes of osteoarthritis in the right hip joint. IMPRESSION: 1. No acute abnormality of the bony pelvis or the left hip. 2. Status post left hip bipolar hemiarthroplasty. 3. Mild right hip joint osteoarthritis. Electronically Signed   By: Vinnie Langton M.D.   On: 06/22/2015 13:13     ASSESSMENT/PLAN:  orders placed for patient to go to [facility] orthoedics for reevaluation of his left hip.I personally  performed the services described in this documentation, which was scribed in my presence. The recorded information has been reviewed and is accurate.-rays of his left hip show previous surgery but no acute problems.   Gross sideeffects, risk and benefits, and alternatives of medications d/w patient. Patient is aware that all medications have potential sideeffects and we are unable to predict every sideeffect or drug-drug interaction that may occur.  Arlyss Queen MD 06/22/2015 12:52 PM

## 2015-06-22 NOTE — Patient Instructions (Addendum)
I have placed a referral for you to be seen at [facility].    IF you received an x-ray today, you will receive an invoice from [facility]. Please contact [facility] at [phone] with questions or concerns regarding your invoice.   IF you received labwork today, you will receive an invoice from [facility]. Please contact [facility] at [phone] with questions or concerns regarding your invoice.   Our billing staff will not be able to assist you with questions regarding bills from these companies.  You will be contacted with the lab results as soon as they are available. The fastest way to get your results is to activate your My Chart account. Instructions are located on the last page of this paperwork. If you have not heard from [place] regarding the results in 2 weeks, please contact this office.

## 2015-06-30 ENCOUNTER — Ambulatory Visit (INDEPENDENT_AMBULATORY_CARE_PROVIDER_SITE_OTHER): Payer: Medicare Other | Admitting: Internal Medicine

## 2015-06-30 ENCOUNTER — Encounter: Payer: Self-pay | Admitting: Internal Medicine

## 2015-06-30 VITALS — BP 124/76 | HR 58 | Ht 66.0 in | Wt 140.4 lb

## 2015-06-30 DIAGNOSIS — E785 Hyperlipidemia, unspecified: Secondary | ICD-10-CM | POA: Diagnosis not present

## 2015-06-30 DIAGNOSIS — I5042 Chronic combined systolic (congestive) and diastolic (congestive) heart failure: Secondary | ICD-10-CM

## 2015-06-30 LAB — LIPID PANEL
Cholesterol: 136 mg/dL (ref 125–200)
HDL: 42 mg/dL (ref >=40)
LDL CALC: 78 mg/dL (ref <130)
TRIGLYCERIDES: 78 mg/dL (ref <150)
Total CHOL/HDL Ratio: 3.2 Ratio (ref <=5.0)
VLDL: 16 mg/dL (ref <30)

## 2015-06-30 LAB — CBC
HCT: 41.8 % (ref 38.5–50.0)
Hemoglobin: 13.7 g/dL (ref 13.2–17.1)
MCH: 29.5 pg (ref 27.0–33.0)
MCHC: 32.8 g/dL (ref 32.0–36.0)
MCV: 89.9 fL (ref 80.0–100.0)
MPV: 11.4 fL (ref 7.5–12.5)
PLATELETS: 213 10*3/uL (ref 140–400)
RBC: 4.65 MIL/uL (ref 4.20–5.80)
RDW: 14.1 % (ref 11.0–15.0)
WBC: 7.3 10*3/uL (ref 3.8–10.8)

## 2015-06-30 LAB — BASIC METABOLIC PANEL
BUN: 25 mg/dL (ref 7–25)
CHLORIDE: 103 mmol/L (ref 98–110)
CO2: 27 mmol/L (ref 20–31)
CREATININE: 1.09 mg/dL (ref 0.70–1.11)
Calcium: 9.2 mg/dL (ref 8.6–10.3)
Glucose, Bld: 95 mg/dL (ref 65–99)
Potassium: 4.6 mmol/L (ref 3.5–5.3)
Sodium: 140 mmol/L (ref 135–146)

## 2015-06-30 LAB — TSH: TSH: 2.92 mIU/L (ref 0.40–4.50)

## 2015-06-30 MED ORDER — TAMSULOSIN HCL 0.4 MG PO CAPS: 0.4000 mg | ORAL_CAPSULE | Freq: Every day | ORAL | Status: DC

## 2015-06-30 MED ORDER — LISINOPRIL-HYDROCHLOROTHIAZIDE 10-12.5 MG PO TABS: ORAL_TABLET | ORAL | Status: DC

## 2015-06-30 MED ORDER — FUROSEMIDE 20 MG PO TABS: 20.0000 mg | ORAL_TABLET | Freq: Every day | ORAL | Status: DC

## 2015-06-30 MED ORDER — METOPROLOL SUCCINATE ER 25 MG PO TB24: ORAL_TABLET | ORAL | Status: DC

## 2015-06-30 NOTE — Patient Instructions (Signed)
Your physician recommends that you continue on your current medications as directed. Please refer to the Current Medication list given to you today. Your physician recommends that you have lab work today (cbc, tsh, bmet, lipids)  Your physician wants you to follow-up in: 6 months with Dr. Harrington Challenger.   You will receive a reminder letter in the mail two months in advance. If you don't receive a letter, please call our office to schedule the follow-up appointment.

## 2015-06-30 NOTE — Progress Notes (Signed)
Cardiology Office Note   Date:  06/30/2015   ID:  [name], DOB Apr 06, 1921, MRN [id number]  PCP:  Jenny Reichmann, MD  Cardiologist:   Dorris Carnes, MD   No chief complaint on file. f/u of chornic systolic CHF      History of Present Illness: [name] is a 82 y.o. 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 baclofen (LIORESAL) 10 MG tablet TAKE ONE-HALF TABLET BY MOUTH THREE TIMES DAILY 35 tablet 3  . Besifloxacin HCl 0.6 % SUSP Apply 1 drop to eye as directed.    . calcitonin, salmon, (MIACALCIN/FORTICAL) 200 UNIT/ACT nasal spray Place 1 spray into alternate nostrils daily. 3.7 mL 2  . esomeprazole (NEXIUM) 40 MG capsule TAKE 1 CAPSULE BY MOUTH  EVERY DAY BEFORE FIRST MEAL OF THE DAY 90 capsule 1  . feeding supplement, ENSURE COMPLETE, (ENSURE COMPLETE) LIQD Take 237 mLs by mouth at bedtime. 30 Bottle 1  . furosemide (LASIX) 20 MG tablet Take 1-2 tablets (20-40 mg total) by mouth daily. Take 20mg  one day then alternate with 40mg  the next day 90 tablet 3  . HYDROcodone-acetaminophen (NORCO) 5-325 MG tablet Take one half tablet every 6 hours as needed for back pain 20 tablet 0  . ketorolac (ACULAR) 0.4 % SOLN Place 1 drop into the left eye as directed.    Marland Kitchen lisinopril-hydrochlorothiazide (PRINZIDE,ZESTORETIC) 10-12.5 MG tablet Take 1 tablet by mouth  daily with breakfast 90 tablet 2  . methylcellulose (SOLUBLE FIBER THERAPY) oral powder Take 1 packet by mouth 4 (four) times daily. Reported on 06/22/2015    . metoprolol succinate (TOPROL-XL) 25 MG 24 hr tablet Take 1 tablet by mouth  daily with breakfast 90 tablet 3  .  Multiple Vitamins-Minerals (VITEYES AREDS ADVANCED) CAPS Take 1 capsule by mouth 2 (two) times daily.    Marland Kitchen omeprazole (PRILOSEC) 40 MG capsule Take 1 capsule (40 mg total) by mouth daily. 90 capsule 3  . Probiotic Product (PROBIOTIC DAILY PO) Take 1 tablet by mouth daily. Reported on 06/22/2015    . simvastatin (ZOCOR) 20 MG tablet Take 1 tablet (20 mg total) by mouth daily. 90 tablet 3  . Tamsulosin HCl (FLOMAX) 0.4 MG CAPS Take 0.4 mg by mouth daily.     . simvastatin (ZOCOR) 20 MG tablet Take 1 tablet by mouth  daily (Patient not taking: Reported on 06/30/2015) 90 tablet 3   No facility-administered medications prior to visit.     Allergies:   Ciprofloxacin and Penicillins   Past Medical History  Diagnosis Date  . BARRETTS ESOPHAGUS 11/19/2007  . CARDIOMYOPATHY 08/02/2008  . CHANGE IN BOWELS 02/01/2009  . CHF 08/10/2008  . Diarrhea 01/31/2009  . DIVERTICULOSIS, COLON 11/19/2007  . GASTRITIS 11/19/2007  . GERD 11/19/2007  . HIATAL HERNIA 11/19/2007  . HYPERLIPIDEMIA 06/27/2009  . HYPOTHYROIDISM 06/27/2009  . MELENA 08/01/2009  . PERSONAL HX COLONIC POLYPS 02/01/2009  . TINEA CORPORIS 06/27/2009  . TOBACCO USE, QUIT 06/27/2009  . TREMOR, ESSENTIAL 06/27/2009  .  HTN (hypertension)   . IBS (irritable bowel syndrome)   . Heart murmur   . Blood transfusion without reported diagnosis     Past Surgical History  Procedure Laterality Date  . Cholecystectomy    . Lung surgery  1984  . Umbilical hernia repair    . Tonsillectomy    . Vasectomy    . Esophagogastroduodenoscopy N/A 04/28/2013    Procedure: ESOPHAGOGASTRODUODENOSCOPY (EGD);  Surgeon: Jerene Bears, MD;  Location: [facility];  Service: Gastroenterology;  Laterality: N/A;  . Tonsillectomy    . Hernia repair    . Hip arthroplasty Left 11/30/2013    Procedure: ARTHROPLASTY BIPOLAR HIP;  Surgeon: [name], MD;  Location: [facility];  Service: Orthopedics;  Laterality: Left;     Social History:  The patient  reports that he quit smoking  about 72 years ago. His smoking use included Cigarettes. He has a 72 pack-year smoking history. He has never used smokeless tobacco. He reports that he drinks about 0.6 oz of alcohol per week. He reports that he does not use illicit drugs.   Family History:  The patient's family history includes Colon cancer in his father; Heart disease in his mother; Prostate cancer in his brother.    ROS:  Please see the history of present illness. All other systems are reviewed and  Negative to the above problem except as noted.    PHYSICAL EXAM: VS:  BP 124/76 mmHg  Pulse 58  Ht 5\' 6"  (1.676 m)  Wt 140 lb 6.4 oz (63.685 kg)  BMI 22.67 kg/m2  GEN: Frail 82 yo , in no acute distress HEENT: normal Neck: no JVD, carotid bruits, or masses Cardiac: RRR; no murmurs, rubs, or gallops,no edema  Respiratory:  clear to auscultation bilaterally, normal work of breathing GI: soft, nontender, nondistended, + BS  No hepatomegaly  MS: y Moving all extremities   Skin: warm and dry, no rash Neuro:  Strength and sensation are intact Psych: euthymic mood, full affect   EKG:  EKG is Not ordered today.   Lipid Panel    Component Value Date/Time   CHOL 129 12/31/2014 0952   TRIG 131 12/31/2014 0952   TRIG 95 05/03/2009   HDL 29* 12/31/2014 0952   CHOLHDL 4.4 12/31/2014 0952   VLDL 26 12/31/2014 0952   LDLCALC 74 12/31/2014 0952      Wt Readings from Last 3 Encounters:  06/30/15 140 lb 6.4 oz (63.685 kg)  06/22/15 143 lb 3.2 oz (64.955 kg)  03/31/15 143 lb (64.864 kg)      ASSESSMENT AND PLAN:  1  Chronoic systoiolc CHF  Volume statu lloks good  Keep on same regimen  Check labs   2.  HL  Keep on statin  Check lipids    3.  HTN  Adequate control  Pt appears more frail  Feelling good  Now living with son  Enjoys that very much     F/U with me in the fall  Signed, Dorris Carnes, MD  06/30/2015 9:20 AM    [address], [place], [place]  [address] Phone: [phone]; Fax: [phone]

## 2015-07-05 ENCOUNTER — Telehealth: Payer: Self-pay | Admitting: Internal Medicine

## 2015-07-05 NOTE — Telephone Encounter (Signed)
New Message:  Pt is returning Ivy call about the some lab test results. They asked that if you are unable to reach them to please leave the results on the VM.Please f/u

## 2015-07-05 NOTE — Telephone Encounter (Signed)
Left voicemail with 06/30/15 lab results.

## 2015-07-12 ENCOUNTER — Telehealth: Payer: Self-pay | Admitting: Internal Medicine

## 2015-07-12 ENCOUNTER — Other Ambulatory Visit: Payer: Self-pay | Admitting: *Deleted

## 2015-07-12 MED ORDER — METOPROLOL SUCCINATE ER 25 MG PO TB24: ORAL_TABLET | ORAL | Status: DC

## 2015-07-12 MED ORDER — LISINOPRIL-HYDROCHLOROTHIAZIDE 10-12.5 MG PO TABS: ORAL_TABLET | ORAL | Status: DC

## 2015-07-12 NOTE — Telephone Encounter (Signed)
New message   *STAT* If patient is at the pharmacy, call can be transferred to refill team.   1. Which medications need to be refilled? (please list name of each medication and dose if known)  lisinopril-hydrochlorothiazide (PRINZIDE,ZESTORETIC) 10-12.5 MG tablet metoprolol succinate (TOPROL-XL) 25 MG 24 hr tablet  READ Comments: Pt was not sure of the name of the medications that needed refill.  Please call back to discuss which of the medications are needed. He states that it has been over 1 week and he is about to run out. Please assist    2. Which pharmacy/location (including street and city if local pharmacy) is medication to be sent to? [facility].  3. Do they need a 30 day or 90 day supply? 90 day supply

## 2015-07-19 ENCOUNTER — Other Ambulatory Visit: Payer: Self-pay

## 2015-07-19 MED ORDER — BACLOFEN 10 MG PO TABS: ORAL_TABLET | ORAL | Status: DC

## 2015-07-25 ENCOUNTER — Encounter (INDEPENDENT_AMBULATORY_CARE_PROVIDER_SITE_OTHER): Payer: Medicare Other | Admitting: Ophthalmology

## 2015-07-25 DIAGNOSIS — H43813 Vitreous degeneration, bilateral: Secondary | ICD-10-CM

## 2015-07-25 DIAGNOSIS — H353221 Exudative age-related macular degeneration, left eye, with active choroidal neovascularization: Secondary | ICD-10-CM

## 2015-07-25 DIAGNOSIS — H353112 Nonexudative age-related macular degeneration, right eye, intermediate dry stage: Secondary | ICD-10-CM

## 2015-07-25 DIAGNOSIS — I10 Essential (primary) hypertension: Secondary | ICD-10-CM

## 2015-07-25 DIAGNOSIS — H35033 Hypertensive retinopathy, bilateral: Secondary | ICD-10-CM | POA: Diagnosis not present

## 2015-07-29 DIAGNOSIS — Z471 Aftercare following joint replacement surgery: Secondary | ICD-10-CM | POA: Diagnosis not present

## 2015-07-29 DIAGNOSIS — Z96642 Presence of left artificial hip joint: Secondary | ICD-10-CM | POA: Diagnosis not present

## 2015-07-29 DIAGNOSIS — M7062 Trochanteric bursitis, left hip: Secondary | ICD-10-CM | POA: Diagnosis not present

## 2015-08-26 ENCOUNTER — Telehealth: Payer: Self-pay | Admitting: Internal Medicine

## 2015-08-26 MED ORDER — ESOMEPRAZOLE MAGNESIUM 40 MG PO CPDR: DELAYED_RELEASE_CAPSULE | ORAL | Status: DC

## 2015-08-26 NOTE — Telephone Encounter (Signed)
Patient's son states that the omeprazole was not that much cheaper than Nexium and they would prefer to switch back to Nexium. Rx sent to [facility].

## 2015-09-05 ENCOUNTER — Encounter (INDEPENDENT_AMBULATORY_CARE_PROVIDER_SITE_OTHER): Payer: Medicare Other | Admitting: Ophthalmology

## 2015-09-05 DIAGNOSIS — H35033 Hypertensive retinopathy, bilateral: Secondary | ICD-10-CM

## 2015-09-05 DIAGNOSIS — H353112 Nonexudative age-related macular degeneration, right eye, intermediate dry stage: Secondary | ICD-10-CM

## 2015-09-05 DIAGNOSIS — H353221 Exudative age-related macular degeneration, left eye, with active choroidal neovascularization: Secondary | ICD-10-CM

## 2015-09-05 DIAGNOSIS — I10 Essential (primary) hypertension: Secondary | ICD-10-CM | POA: Diagnosis not present

## 2015-09-05 DIAGNOSIS — H43813 Vitreous degeneration, bilateral: Secondary | ICD-10-CM | POA: Diagnosis not present

## 2015-09-08 DIAGNOSIS — Z471 Aftercare following joint replacement surgery: Secondary | ICD-10-CM | POA: Diagnosis not present

## 2015-09-08 DIAGNOSIS — Z96642 Presence of left artificial hip joint: Secondary | ICD-10-CM | POA: Diagnosis not present

## 2015-09-08 DIAGNOSIS — M7062 Trochanteric bursitis, left hip: Secondary | ICD-10-CM | POA: Diagnosis not present

## 2015-09-12 DIAGNOSIS — S22080D Wedge compression fracture of T11-T12 vertebra, subsequent encounter for fracture with routine healing: Secondary | ICD-10-CM | POA: Diagnosis not present

## 2015-09-12 DIAGNOSIS — S32020D Wedge compression fracture of second lumbar vertebra, subsequent encounter for fracture with routine healing: Secondary | ICD-10-CM | POA: Diagnosis not present

## 2015-10-10 ENCOUNTER — Encounter (INDEPENDENT_AMBULATORY_CARE_PROVIDER_SITE_OTHER): Payer: Medicare Other | Admitting: Ophthalmology

## 2015-10-11 ENCOUNTER — Encounter (INDEPENDENT_AMBULATORY_CARE_PROVIDER_SITE_OTHER): Payer: Medicare Other | Admitting: Ophthalmology

## 2015-10-11 DIAGNOSIS — H43813 Vitreous degeneration, bilateral: Secondary | ICD-10-CM | POA: Diagnosis not present

## 2015-10-11 DIAGNOSIS — H35033 Hypertensive retinopathy, bilateral: Secondary | ICD-10-CM

## 2015-10-11 DIAGNOSIS — I10 Essential (primary) hypertension: Secondary | ICD-10-CM

## 2015-10-11 DIAGNOSIS — H353221 Exudative age-related macular degeneration, left eye, with active choroidal neovascularization: Secondary | ICD-10-CM | POA: Diagnosis not present

## 2015-10-11 DIAGNOSIS — H353111 Nonexudative age-related macular degeneration, right eye, early dry stage: Secondary | ICD-10-CM

## 2015-10-18 ENCOUNTER — Telehealth: Payer: Self-pay | Admitting: *Deleted

## 2015-10-18 ENCOUNTER — Other Ambulatory Visit: Payer: Self-pay | Admitting: *Deleted

## 2015-10-18 DIAGNOSIS — E785 Hyperlipidemia, unspecified: Secondary | ICD-10-CM

## 2015-10-18 DIAGNOSIS — I5042 Chronic combined systolic (congestive) and diastolic (congestive) heart failure: Secondary | ICD-10-CM

## 2015-10-18 MED ORDER — SIMVASTATIN 20 MG PO TABS: 20.0000 mg | ORAL_TABLET | Freq: Every day | ORAL | 3 refills | Status: DC

## 2015-10-18 MED ORDER — FUROSEMIDE 20 MG PO TABS: 20.0000 mg | ORAL_TABLET | Freq: Every day | ORAL | 3 refills | Status: DC

## 2015-10-18 NOTE — Telephone Encounter (Signed)
Patient called to request medication refills but asked that I call his son Marya Amsler [phone] to verify the information.

## 2015-10-25 HISTORY — DX: Hypomagnesemia: E83.42

## 2015-11-03 ENCOUNTER — Inpatient Hospital Stay (HOSPITAL_COMMUNITY): Payer: Medicare Other

## 2015-11-03 ENCOUNTER — Encounter (HOSPITAL_COMMUNITY): Payer: Self-pay | Admitting: Emergency Medicine

## 2015-11-03 ENCOUNTER — Inpatient Hospital Stay (HOSPITAL_COMMUNITY)
Admission: EM | Admit: 2015-11-03 | Discharge: 2015-11-16 | DRG: 329 | Disposition: A | Discharge status: Skilled Nursing Facility | Payer: Medicare Other | Attending: Internal Medicine | Admitting: Internal Medicine

## 2015-11-03 ENCOUNTER — Emergency Department (HOSPITAL_COMMUNITY): Payer: Medicare Other

## 2015-11-03 DIAGNOSIS — G9340 Encephalopathy, unspecified: Secondary | ICD-10-CM | POA: Diagnosis not present

## 2015-11-03 DIAGNOSIS — I480 Paroxysmal atrial fibrillation: Secondary | ICD-10-CM | POA: Diagnosis not present

## 2015-11-03 DIAGNOSIS — E785 Hyperlipidemia, unspecified: Secondary | ICD-10-CM | POA: Diagnosis present

## 2015-11-03 DIAGNOSIS — Z9181 History of falling: Secondary | ICD-10-CM

## 2015-11-03 DIAGNOSIS — K559 Vascular disorder of intestine, unspecified: Secondary | ICD-10-CM | POA: Diagnosis present

## 2015-11-03 DIAGNOSIS — T8119XA Other postprocedural shock, initial encounter: Secondary | ICD-10-CM | POA: Diagnosis not present

## 2015-11-03 DIAGNOSIS — E870 Hyperosmolality and hypernatremia: Secondary | ICD-10-CM | POA: Diagnosis present

## 2015-11-03 DIAGNOSIS — I10 Essential (primary) hypertension: Secondary | ICD-10-CM | POA: Diagnosis not present

## 2015-11-03 DIAGNOSIS — F0280 Dementia in other diseases classified elsewhere without behavioral disturbance: Secondary | ICD-10-CM | POA: Diagnosis present

## 2015-11-03 DIAGNOSIS — Z5189 Encounter for other specified aftercare: Secondary | ICD-10-CM | POA: Diagnosis not present

## 2015-11-03 DIAGNOSIS — G309 Alzheimer's disease, unspecified: Secondary | ICD-10-CM | POA: Diagnosis present

## 2015-11-03 DIAGNOSIS — J9601 Acute respiratory failure with hypoxia: Secondary | ICD-10-CM | POA: Diagnosis present

## 2015-11-03 DIAGNOSIS — E039 Hypothyroidism, unspecified: Secondary | ICD-10-CM | POA: Diagnosis present

## 2015-11-03 DIAGNOSIS — Z4682 Encounter for fitting and adjustment of non-vascular catheter: Secondary | ICD-10-CM | POA: Diagnosis not present

## 2015-11-03 DIAGNOSIS — Z4659 Encounter for fitting and adjustment of other gastrointestinal appliance and device: Secondary | ICD-10-CM

## 2015-11-03 DIAGNOSIS — K2270 Barrett's esophagus without dysplasia: Secondary | ICD-10-CM | POA: Diagnosis present

## 2015-11-03 DIAGNOSIS — J189 Pneumonia, unspecified organism: Secondary | ICD-10-CM

## 2015-11-03 DIAGNOSIS — S32020G Wedge compression fracture of second lumbar vertebra, subsequent encounter for fracture with delayed healing: Secondary | ICD-10-CM

## 2015-11-03 DIAGNOSIS — K5669 Other intestinal obstruction: Secondary | ICD-10-CM | POA: Diagnosis not present

## 2015-11-03 DIAGNOSIS — R918 Other nonspecific abnormal finding of lung field: Secondary | ICD-10-CM | POA: Diagnosis not present

## 2015-11-03 DIAGNOSIS — R0902 Hypoxemia: Secondary | ICD-10-CM | POA: Diagnosis not present

## 2015-11-03 DIAGNOSIS — I5042 Chronic combined systolic (congestive) and diastolic (congestive) heart failure: Secondary | ICD-10-CM | POA: Diagnosis present

## 2015-11-03 DIAGNOSIS — Z881 Allergy status to other antibiotic agents status: Secondary | ICD-10-CM

## 2015-11-03 DIAGNOSIS — I5043 Acute on chronic combined systolic (congestive) and diastolic (congestive) heart failure: Secondary | ICD-10-CM | POA: Diagnosis present

## 2015-11-03 DIAGNOSIS — Z452 Encounter for adjustment and management of vascular access device: Secondary | ICD-10-CM | POA: Diagnosis not present

## 2015-11-03 DIAGNOSIS — E43 Unspecified severe protein-calorie malnutrition: Secondary | ICD-10-CM | POA: Diagnosis not present

## 2015-11-03 DIAGNOSIS — Z96642 Presence of left artificial hip joint: Secondary | ICD-10-CM | POA: Diagnosis present

## 2015-11-03 DIAGNOSIS — N179 Acute kidney failure, unspecified: Secondary | ICD-10-CM | POA: Diagnosis not present

## 2015-11-03 DIAGNOSIS — K567 Ileus, unspecified: Secondary | ICD-10-CM

## 2015-11-03 DIAGNOSIS — T189XXA Foreign body of alimentary tract, part unspecified, initial encounter: Secondary | ICD-10-CM

## 2015-11-03 DIAGNOSIS — J90 Pleural effusion, not elsewhere classified: Secondary | ICD-10-CM | POA: Diagnosis not present

## 2015-11-03 DIAGNOSIS — E860 Dehydration: Secondary | ICD-10-CM | POA: Diagnosis present

## 2015-11-03 DIAGNOSIS — W44F1XA Bezoar entering into or through a natural orifice, initial encounter: Secondary | ICD-10-CM

## 2015-11-03 DIAGNOSIS — I129 Hypertensive chronic kidney disease with stage 1 through stage 4 chronic kidney disease, or unspecified chronic kidney disease: Secondary | ICD-10-CM | POA: Diagnosis not present

## 2015-11-03 DIAGNOSIS — R109 Unspecified abdominal pain: Secondary | ICD-10-CM | POA: Diagnosis not present

## 2015-11-03 DIAGNOSIS — K561 Intussusception: Secondary | ICD-10-CM | POA: Diagnosis not present

## 2015-11-03 DIAGNOSIS — X58XXXA Exposure to other specified factors, initial encounter: Secondary | ICD-10-CM | POA: Diagnosis present

## 2015-11-03 DIAGNOSIS — Y838 Other surgical procedures as the cause of abnormal reaction of the patient, or of later complication, without mention of misadventure at the time of the procedure: Secondary | ICD-10-CM | POA: Diagnosis not present

## 2015-11-03 DIAGNOSIS — I429 Cardiomyopathy, unspecified: Secondary | ICD-10-CM

## 2015-11-03 DIAGNOSIS — K5700 Diverticulitis of small intestine with perforation and abscess without bleeding: Secondary | ICD-10-CM | POA: Diagnosis not present

## 2015-11-03 DIAGNOSIS — Z87891 Personal history of nicotine dependence: Secondary | ICD-10-CM | POA: Diagnosis not present

## 2015-11-03 DIAGNOSIS — I4891 Unspecified atrial fibrillation: Secondary | ICD-10-CM | POA: Diagnosis present

## 2015-11-03 DIAGNOSIS — I130 Hypertensive heart and chronic kidney disease with heart failure and stage 1 through stage 4 chronic kidney disease, or unspecified chronic kidney disease: Secondary | ICD-10-CM | POA: Diagnosis present

## 2015-11-03 DIAGNOSIS — Z800 Family history of malignant neoplasm of digestive organs: Secondary | ICD-10-CM

## 2015-11-03 DIAGNOSIS — J9690 Respiratory failure, unspecified, unspecified whether with hypoxia or hypercapnia: Secondary | ICD-10-CM

## 2015-11-03 DIAGNOSIS — T183XXA Foreign body in small intestine, initial encounter: Principal | ICD-10-CM | POA: Diagnosis present

## 2015-11-03 DIAGNOSIS — N182 Chronic kidney disease, stage 2 (mild): Secondary | ICD-10-CM | POA: Diagnosis not present

## 2015-11-03 DIAGNOSIS — I491 Atrial premature depolarization: Secondary | ICD-10-CM | POA: Diagnosis not present

## 2015-11-03 DIAGNOSIS — K22719 Barrett's esophagus with dysplasia, unspecified: Secondary | ICD-10-CM | POA: Diagnosis not present

## 2015-11-03 DIAGNOSIS — I5023 Acute on chronic systolic (congestive) heart failure: Secondary | ICD-10-CM | POA: Diagnosis present

## 2015-11-03 DIAGNOSIS — R000 Tachycardia, unspecified: Secondary | ICD-10-CM | POA: Diagnosis not present

## 2015-11-03 DIAGNOSIS — M4856XA Collapsed vertebra, not elsewhere classified, lumbar region, initial encounter for fracture: Secondary | ICD-10-CM | POA: Diagnosis present

## 2015-11-03 DIAGNOSIS — K5660 Unspecified intestinal obstruction: Secondary | ICD-10-CM | POA: Diagnosis not present

## 2015-11-03 DIAGNOSIS — Z8249 Family history of ischemic heart disease and other diseases of the circulatory system: Secondary | ICD-10-CM

## 2015-11-03 DIAGNOSIS — I248 Other forms of acute ischemic heart disease: Secondary | ICD-10-CM | POA: Diagnosis not present

## 2015-11-03 DIAGNOSIS — R188 Other ascites: Secondary | ICD-10-CM | POA: Diagnosis not present

## 2015-11-03 DIAGNOSIS — J9600 Acute respiratory failure, unspecified whether with hypoxia or hypercapnia: Secondary | ICD-10-CM

## 2015-11-03 DIAGNOSIS — M6281 Muscle weakness (generalized): Secondary | ICD-10-CM | POA: Diagnosis not present

## 2015-11-03 DIAGNOSIS — Z86010 Personal history of colonic polyps: Secondary | ICD-10-CM

## 2015-11-03 DIAGNOSIS — K219 Gastro-esophageal reflux disease without esophagitis: Secondary | ICD-10-CM | POA: Diagnosis present

## 2015-11-03 DIAGNOSIS — R140 Abdominal distension (gaseous): Secondary | ICD-10-CM | POA: Diagnosis not present

## 2015-11-03 DIAGNOSIS — Z8042 Family history of malignant neoplasm of prostate: Secondary | ICD-10-CM

## 2015-11-03 DIAGNOSIS — N400 Enlarged prostate without lower urinary tract symptoms: Secondary | ICD-10-CM | POA: Diagnosis present

## 2015-11-03 DIAGNOSIS — Z79899 Other long term (current) drug therapy: Secondary | ICD-10-CM

## 2015-11-03 DIAGNOSIS — I272 Other secondary pulmonary hypertension: Secondary | ICD-10-CM | POA: Diagnosis present

## 2015-11-03 DIAGNOSIS — Z0189 Encounter for other specified special examinations: Secondary | ICD-10-CM

## 2015-11-03 DIAGNOSIS — R1312 Dysphagia, oropharyngeal phase: Secondary | ICD-10-CM | POA: Diagnosis not present

## 2015-11-03 DIAGNOSIS — T189XXD Foreign body of alimentary tract, part unspecified, subsequent encounter: Secondary | ICD-10-CM | POA: Diagnosis not present

## 2015-11-03 DIAGNOSIS — K56609 Unspecified intestinal obstruction, unspecified as to partial versus complete obstruction: Secondary | ICD-10-CM

## 2015-11-03 DIAGNOSIS — Z9049 Acquired absence of other specified parts of digestive tract: Secondary | ICD-10-CM

## 2015-11-03 DIAGNOSIS — K589 Irritable bowel syndrome without diarrhea: Secondary | ICD-10-CM | POA: Diagnosis present

## 2015-11-03 DIAGNOSIS — Z880 Allergy status to penicillin: Secondary | ICD-10-CM

## 2015-11-03 DIAGNOSIS — J690 Pneumonitis due to inhalation of food and vomit: Secondary | ICD-10-CM | POA: Diagnosis not present

## 2015-11-03 DIAGNOSIS — R2681 Unsteadiness on feet: Secondary | ICD-10-CM | POA: Diagnosis not present

## 2015-11-03 DIAGNOSIS — S32020A Wedge compression fracture of second lumbar vertebra, initial encounter for closed fracture: Secondary | ICD-10-CM | POA: Diagnosis present

## 2015-11-03 DIAGNOSIS — Z978 Presence of other specified devices: Secondary | ICD-10-CM

## 2015-11-03 DIAGNOSIS — E162 Hypoglycemia, unspecified: Secondary | ICD-10-CM | POA: Diagnosis not present

## 2015-11-03 DIAGNOSIS — K2970 Gastritis, unspecified, without bleeding: Secondary | ICD-10-CM | POA: Diagnosis present

## 2015-11-03 DIAGNOSIS — H9190 Unspecified hearing loss, unspecified ear: Secondary | ICD-10-CM

## 2015-11-03 DIAGNOSIS — G250 Essential tremor: Secondary | ICD-10-CM | POA: Diagnosis present

## 2015-11-03 DIAGNOSIS — E876 Hypokalemia: Secondary | ICD-10-CM | POA: Diagnosis not present

## 2015-11-03 DIAGNOSIS — R9431 Abnormal electrocardiogram [ECG] [EKG]: Secondary | ICD-10-CM | POA: Diagnosis not present

## 2015-11-03 HISTORY — DX: Other cardiomyopathies: I42.8

## 2015-11-03 HISTORY — DX: Chronic combined systolic (congestive) and diastolic (congestive) heart failure: I50.42

## 2015-11-03 LAB — COMPREHENSIVE METABOLIC PANEL
ALBUMIN: 3.2 g/dL — AB (ref 3.5–5.0)
ALK PHOS: 137 U/L — AB (ref 38–126)
ALT: 42 U/L (ref 17–63)
AST: 66 U/L — ABNORMAL HIGH (ref 15–41)
Anion gap: 9 (ref 5–15)
BUN: 30 mg/dL — ABNORMAL HIGH (ref 6–20)
CALCIUM: 8 mg/dL — AB (ref 8.9–10.3)
CO2: 25 mmol/L (ref 22–32)
CREATININE: 1.17 mg/dL (ref 0.61–1.24)
Chloride: 104 mmol/L (ref 101–111)
GFR calc Af Amer: >60 mL/min (ref >60)
GFR calc non Af Amer: 52 mL/min — ABNORMAL LOW (ref >60)
GLUCOSE: 171 mg/dL — AB (ref 65–99)
Potassium: 4.7 mmol/L (ref 3.5–5.1)
SODIUM: 138 mmol/L (ref 135–145)
Total Bilirubin: 1.2 mg/dL (ref 0.3–1.2)
Total Protein: 6.1 g/dL — ABNORMAL LOW (ref 6.5–8.1)

## 2015-11-03 LAB — CBC WITH DIFFERENTIAL/PLATELET
BASOS ABS: 0 10*3/uL (ref 0.0–0.1)
BASOS PCT: 0 %
EOS ABS: 0.2 10*3/uL (ref 0.0–0.7)
Eosinophils Relative: 3 %
HCT: 38.4 % — ABNORMAL LOW (ref 39.0–52.0)
HEMOGLOBIN: 12.8 g/dL — AB (ref 13.0–17.0)
Lymphocytes Relative: 14 %
Lymphs Abs: 1 10*3/uL (ref 0.7–4.0)
MCH: 30.2 pg (ref 26.0–34.0)
MCHC: 33.3 g/dL (ref 30.0–36.0)
MCV: 90.6 fL (ref 78.0–100.0)
Monocytes Absolute: 0.4 10*3/uL (ref 0.1–1.0)
Monocytes Relative: 6 %
NEUTROS PCT: 77 %
Neutro Abs: 5.2 10*3/uL (ref 1.7–7.7)
Platelets: 216 10*3/uL (ref 150–400)
RBC: 4.24 MIL/uL (ref 4.22–5.81)
RDW: 14.5 % (ref 11.5–15.5)
WBC: 6.9 10*3/uL (ref 4.0–10.5)

## 2015-11-03 LAB — URINALYSIS, ROUTINE W REFLEX MICROSCOPIC
Bilirubin Urine: NEGATIVE
GLUCOSE, UA: NEGATIVE mg/dL
HGB URINE DIPSTICK: NEGATIVE
Ketones, ur: NEGATIVE mg/dL
Leukocytes, UA: NEGATIVE
Nitrite: NEGATIVE
PH: 6 (ref 5.0–8.0)
PROTEIN: NEGATIVE mg/dL
Specific Gravity, Urine: 1.038 — ABNORMAL HIGH (ref 1.005–1.030)

## 2015-11-03 LAB — I-STAT CHEM 8, ED
BUN: 45 mg/dL — AB (ref 6–20)
CREATININE: 1.1 mg/dL (ref 0.61–1.24)
Calcium, Ion: 0.98 mmol/L — ABNORMAL LOW (ref 1.12–1.23)
Chloride: 102 mmol/L (ref 101–111)
GLUCOSE: 168 mg/dL — AB (ref 65–99)
HCT: 41 % (ref 39.0–52.0)
HEMOGLOBIN: 13.9 g/dL (ref 13.0–17.0)
POTASSIUM: 4.6 mmol/L (ref 3.5–5.1)
Sodium: 140 mmol/L (ref 135–145)
TCO2: 29 mmol/L (ref 0–100)

## 2015-11-03 LAB — LIPASE, BLOOD: Lipase: 30 U/L (ref 11–51)

## 2015-11-03 MED ORDER — SODIUM CHLORIDE 0.9 % IV SOLN
1000.0000 mL | Freq: Once | INTRAVENOUS | Status: AC
  Administered 2015-11-03: 1000 mL via INTRAVENOUS

## 2015-11-03 MED ORDER — ONDANSETRON HCL 4 MG/2ML IJ SOLN: 4.0000 mg | Freq: Four times a day (QID) | INTRAMUSCULAR | Status: DC | PRN

## 2015-11-03 MED ORDER — SODIUM CHLORIDE 0.9 % IV SOLN
3.0000 g | Freq: Four times a day (QID) | INTRAVENOUS | Status: DC
  Administered 2015-11-03 – 2015-11-06 (×11): 3 g via INTRAVENOUS
  Filled 2015-11-03 (×12): qty 3

## 2015-11-03 MED ORDER — IOPAMIDOL (ISOVUE-300) INJECTION 61%
100.0000 mL | Freq: Once | INTRAVENOUS | Status: AC | PRN
  Administered 2015-11-03: 80 mL via INTRAVENOUS

## 2015-11-03 MED ORDER — ONDANSETRON HCL 4 MG PO TABS: 4.0000 mg | ORAL_TABLET | Freq: Four times a day (QID) | ORAL | Status: DC | PRN

## 2015-11-03 MED ORDER — SODIUM CHLORIDE 0.9 % IV SOLN
1000.0000 mL | INTRAVENOUS | Status: DC
  Administered 2015-11-03: 1000 mL via INTRAVENOUS

## 2015-11-03 MED ORDER — DEXTROSE 5 % IV SOLN
1.0000 g | Freq: Once | INTRAVENOUS | Status: AC
  Administered 2015-11-03: 1 g via INTRAVENOUS
  Filled 2015-11-03: qty 10

## 2015-11-03 MED ORDER — ENOXAPARIN SODIUM 40 MG/0.4ML ~~LOC~~ SOLN
40.0000 mg | SUBCUTANEOUS | Status: DC
  Administered 2015-11-03 – 2015-11-05 (×3): 40 mg via SUBCUTANEOUS
  Filled 2015-11-03 (×3): qty 0.4

## 2015-11-03 MED ORDER — MORPHINE SULFATE (PF) 4 MG/ML IV SOLN
4.0000 mg | Freq: Once | INTRAVENOUS | Status: AC
  Administered 2015-11-03: 4 mg via INTRAVENOUS
  Filled 2015-11-03: qty 1

## 2015-11-03 MED ORDER — DEXTROSE 5 % IV SOLN
500.0000 mg | Freq: Once | INTRAVENOUS | Status: AC
  Administered 2015-11-03: 500 mg via INTRAVENOUS
  Filled 2015-11-03: qty 500

## 2015-11-03 MED ORDER — ACETAMINOPHEN 325 MG PO TABS: 650.0000 mg | ORAL_TABLET | Freq: Four times a day (QID) | ORAL | Status: DC | PRN

## 2015-11-03 MED ORDER — SODIUM CHLORIDE 0.9 % IV SOLN
INTRAVENOUS | Status: DC
  Administered 2015-11-03: 100 mL via INTRAVENOUS

## 2015-11-03 MED ORDER — ONDANSETRON HCL 4 MG/2ML IJ SOLN
4.0000 mg | Freq: Once | INTRAMUSCULAR | Status: AC
  Administered 2015-11-03: 4 mg via INTRAVENOUS
  Filled 2015-11-03: qty 2

## 2015-11-03 MED ORDER — ACETAMINOPHEN 650 MG RE SUPP: 650.0000 mg | Freq: Four times a day (QID) | RECTAL | Status: DC | PRN

## 2015-11-03 MED ORDER — METOPROLOL TARTRATE 5 MG/5ML IV SOLN
2.5000 mg | Freq: Three times a day (TID) | INTRAVENOUS | Status: DC
  Administered 2015-11-03: 2.5 mg via INTRAVENOUS
  Filled 2015-11-03: qty 5

## 2015-11-03 MED ORDER — MORPHINE SULFATE (PF) 2 MG/ML IV SOLN
2.0000 mg | INTRAVENOUS | Status: DC | PRN
  Administered 2015-11-07 – 2015-11-08 (×3): 2 mg via INTRAVENOUS
  Filled 2015-11-03 (×3): qty 1

## 2015-11-03 NOTE — ED Notes (Signed)
Sats dropped(76%) since putting NG tube in. Patient placed on a NRB and sats increased to 100%.

## 2015-11-03 NOTE — ED Triage Notes (Signed)
Patient from home with intermittent LLQ abdominal pain which started around 11:00 this morning.  Nausea and vomiting present.  EMS gave him 4mg  of Zofran IV.  BM's are normal.  EMS said he is pale and diaphoretic.  O2 sats on room air 89%.  Patient is guarding due to pain.

## 2015-11-03 NOTE — H&P (Signed)
History and Physical    [name] [id number] DOB: August 30, 1921 DOA: 11/03/2015    PCP: Jenny Reichmann, MD  Patient coming from: home  Chief Complaint: vomiting  HPI: [name] is a 82 y.o. male with medical history significant of cardiomyopathy, lung lobectomy in 1980s, Barretts esophagus, vertebral compression fracture who developed sudden onset abdominal pain and vomited once while at [employer] today around 11 pm. No fever or chills.  Currently not in abdominal pain and has had no further vomiting.   ED Course: CT shows intussusception of ileum, he is quite hypoxic with pulse ox in 80s on 4 L O2- putting on face mask now. No respiratory distress.   Review of Systems:  All other systems reviewed and apart from HPI, are negative.  Past Medical History:  Diagnosis Date  . BARRETTS ESOPHAGUS 11/19/2007  . Blood transfusion without reported diagnosis   . CARDIOMYOPATHY 08/02/2008  . CHANGE IN BOWELS 02/01/2009  . CHF 08/10/2008  . Diarrhea 01/31/2009  . DIVERTICULOSIS, COLON 11/19/2007  . GASTRITIS 11/19/2007  . GERD 11/19/2007  . Heart murmur   . HIATAL HERNIA 11/19/2007  . HTN (hypertension)   . HYPERLIPIDEMIA 06/27/2009  . HYPOTHYROIDISM 06/27/2009  . IBS (irritable bowel syndrome)   . MELENA 08/01/2009  . PERSONAL HX COLONIC POLYPS 02/01/2009  . TINEA CORPORIS 06/27/2009  . TOBACCO USE, QUIT 06/27/2009  . TREMOR, ESSENTIAL 06/27/2009    Past Surgical History:  Procedure Laterality Date  . CHOLECYSTECTOMY    . ESOPHAGOGASTRODUODENOSCOPY N/A 04/28/2013   Procedure: ESOPHAGOGASTRODUODENOSCOPY (EGD);  Surgeon: Jerene Bears, MD;  Location: [facility];  Service: Gastroenterology;  Laterality: N/A;  . HERNIA REPAIR    . HIP ARTHROPLASTY Left 11/30/2013   Procedure: ARTHROPLASTY BIPOLAR HIP;  Surgeon: [name], MD;  Location: [facility];  Service: Orthopedics;  Laterality: Left;  . LUNG SURGERY  1984  . TONSILLECTOMY    . TONSILLECTOMY    . UMBILICAL HERNIA REPAIR    . VASECTOMY       Social History:   reports that he quit smoking about 72 years ago. His smoking use included Cigarettes. He has a 72.00 pack-year smoking history. He has never used smokeless tobacco. He reports that he drinks about 0.6 oz of alcohol per week . He reports that he does not use drugs.  Allergies  Allergen Reactions  . Ciprofloxacin Other (See Comments)    Reaction:  Unknown   . Penicillins Rash and Other (See Comments)    Has patient had a PCN reaction causing immediate rash, facial/tongue/throat swelling, SOB or lightheadedness with hypotension: No Has patient had a PCN reaction causing severe rash involving mucus membranes or skin necrosis: No Has patient had a PCN reaction that required hospitalization No Has patient had a PCN reaction occurring within the last 10 years: No If all of the above answers are "NO", then may proceed with Cephalosporin use.    Family History  Problem Relation Age of Onset  . Heart disease Mother   . Colon cancer Father   . Prostate cancer Brother      Prior to Admission medications   Medication Sig Start Date End Date Taking? Authorizing Provider  acidophilus (RISAQUAD) CAPS capsule Take 1 capsule by mouth daily.   Yes Historical Provider, MD  baclofen (LIORESAL) 10 MG tablet Take 5 mg by mouth 3 (three) times daily.   Yes Historical Provider, MD  ENSURE PLUS (ENSURE PLUS) LIQD Take 237 mLs by mouth daily.  Yes Historical Provider, MD  esomeprazole (NEXIUM) 40 MG capsule Take 40 mg by mouth daily before breakfast.   Yes Historical Provider, MD  furosemide (LASIX) 20 MG tablet Take 20 mg by mouth every other day.   Yes Historical Provider, MD  furosemide (LASIX) 40 MG tablet Take 40 mg by mouth every other day.   Yes Historical Provider, MD  lisinopril-hydrochlorothiazide (PRINZIDE,ZESTORETIC) 10-12.5 MG tablet Take 1 tablet by mouth daily.   Yes Historical Provider, MD  metoprolol succinate (TOPROL-XL) 25 MG 24 hr tablet Take 25 mg by mouth daily.    Yes Historical Provider, MD  Multiple Vitamins-Minerals (PRESERVISION AREDS 2) CAPS Take 1 capsule by mouth 2 (two) times daily.   Yes Historical Provider, MD  simvastatin (ZOCOR) 20 MG tablet Take 20 mg by mouth at bedtime.   Yes Historical Provider, MD  tamsulosin (FLOMAX) 0.4 MG CAPS capsule Take 0.4 mg by mouth daily after supper.   Yes Historical Provider, MD    Physical Exam: Vitals:   11/03/15 1600 11/03/15 1630 11/03/15 1730 11/03/15 1800  BP: 154/72 146/79 169/88 176/93  Pulse: 70 73 70 97  Resp: 17 22 22 25   Temp:      TempSrc:      SpO2: 96% 92% (!) 84% 97%      Constitutional: NAD, calm, comfortable- very poor hearing Eyes: PERTLA, lids and conjunctivae normal ENMT: Mucous membranes are moist. Posterior pharynx clear of any exudate or lesions. Normal dentition.  Neck: normal, supple, no masses, no thyromegaly Respiratory: clear to auscultation bilaterally, no wheezing, no crackles. Normal respiratory effort. No accessory muscle use.  Cardiovascular: S1 & S2 heard, regular rate and rhythm, no murmurs / rubs / gallops. No extremity edema. 2+ pedal pulses. No carotid bruits.  Abdomen: No distension, + tenderness  Below umbilicus, decreased bowel sounds , no masses palpated. No hepatosplenomegaly.  Musculoskeletal: no clubbing / cyanosis. No joint deformity upper and lower extremities. Good ROM, no contractures. Normal muscle tone.  Skin: no rashes, lesions, ulcers. No induration Neurologic: CN 2-12 grossly intact. Sensation intact, DTR normal. Strength 5/5 in all 4 limbs.  Psychiatric: Normal judgment and insight. Alert and oriented x 3. Normal mood.     Labs on Admission: I have personally reviewed following labs and imaging studies  CBC:  Recent Labs Lab 11/03/15 1430 11/03/15 1438  WBC 6.9  --   NEUTROABS 5.2  --   HGB 12.8* 13.9  HCT 38.4* 41.0  MCV 90.6  --   PLT 216  --    Basic Metabolic Panel:  Recent Labs Lab 11/03/15 1430 11/03/15 1438  NA 138  140  K 4.7 4.6  CL 104 102  CO2 25  --   GLUCOSE 171* 168*  BUN 30* 45*  CREATININE 1.17 1.10  CALCIUM 8.0*  --    GFR: CrCl cannot be calculated (Unknown ideal weight.). Liver Function Tests:  Recent Labs Lab 11/03/15 1430  AST 66*  ALT 42  ALKPHOS 137*  BILITOT 1.2  PROT 6.1*  ALBUMIN 3.2*    Recent Labs Lab 11/03/15 1430  LIPASE 30   No results for input(s): AMMONIA in the last 168 hours. Coagulation Profile: No results for input(s): INR, PROTIME in the last 168 hours. Cardiac Enzymes: No results for input(s): CKTOTAL, CKMB, CKMBINDEX, TROPONINI in the last 168 hours. BNP (last 3 results) No results for input(s): PROBNP in the last 8760 hours. HbA1C: No results for input(s): HGBA1C in the last 72 hours. CBG: No results for  input(s): GLUCAP in the last 168 hours. Lipid Profile: No results for input(s): CHOL, HDL, LDLCALC, TRIG, CHOLHDL, LDLDIRECT in the last 72 hours. Thyroid Function Tests: No results for input(s): TSH, T4TOTAL, FREET4, T3FREE, THYROIDAB in the last 72 hours. Anemia Panel: No results for input(s): VITAMINB12, FOLATE, FERRITIN, TIBC, IRON, RETICCTPCT in the last 72 hours. Urine analysis:    Component Value Date/Time   COLORURINE YELLOW 11/30/2013 0804   APPEARANCEUR CLOUDY (A) 11/30/2013 0804   LABSPEC 1.020 11/30/2013 0804   PHURINE 5.0 11/30/2013 0804   GLUCOSEU NEGATIVE 11/30/2013 0804   HGBUR NEGATIVE 11/30/2013 0804   BILIRUBINUR NEGATIVE 11/30/2013 0804   BILIRUBINUR neg 08/18/2013 0848   KETONESUR NEGATIVE 11/30/2013 0804   PROTEINUR NEGATIVE 11/30/2013 0804   UROBILINOGEN 0.2 11/30/2013 0804   NITRITE NEGATIVE 11/30/2013 0804   LEUKOCYTESUR NEGATIVE 11/30/2013 0804   Sepsis Labs: @LABRCNTIP (procalcitonin:4,lacticidven:4) )No results found for this or any previous visit (from the past 240 hour(s)).   Radiological Exams on Admission: Ct Abdomen Pelvis W Contrast  Result Date: 11/03/2015 CLINICAL DATA:  Intermittent left  lower quadrant pain for several hours, initial encounter EXAM: CT ABDOMEN AND PELVIS WITH CONTRAST TECHNIQUE: Multidetector CT imaging of the abdomen and pelvis was performed using the standard protocol following bolus administration of intravenous contrast. CONTRAST:  58mL ISOVUE-300 IOPAMIDOL (ISOVUE-300) INJECTION 61% COMPARISON:  None. FINDINGS: Lower chest: Calcified pleural plaques are identified bilaterally. Mild bilateral lower lobe atelectatic changes are seen. No sizable effusion is noted. Hepatobiliary: Gallbladder is been surgically removed. The liver is within normal limits. Pancreas: There is 11 mm hypodensity noted in the head of the pancreas best seen on image number 31 of series 2. Although this may represent a pancreatic cyst the possibility of small pancreatic lesion could not be totally excluded. Spleen: Small splenic cyst is noted. Adrenals/Urinary Tract: No masses identified. No evidence of hydronephrosis. Stomach/Bowel: There is small bowel dilatation with some mild inflammatory changes surrounding the small bowel. This obstructive pattern continues into the right lower quadrant head which point there is an intussusception of the ileum within the ileum. No definitive mass lesion is seen. Small bowel beyond this intussusception is within normal limits. Scattered small bowel diverticula are noted. A large fluid containing diverticulum of the duodenum is seen adjacent to the pancreatic head. Moderate-sized hiatal hernia is noted. Vascular/Lymphatic: No pathologically enlarged lymph nodes. No evidence of abdominal aortic aneurysm. Reproductive: No mass or other significant abnormality. Other: Mild free fluid is noted within the pelvis. Musculoskeletal: Postsurgical changes in the left hip are noted. Chronic compression deformities at T12 and L2 are noted IMPRESSION: Small-bowel obstruction secondary to an ileal to ileal intussusception in the right lower quadrant. Some inflammatory changes are  noted surrounding the dilated loops in the left mid abdomen. No evidence of perforation is identified. Hypodensity within the head of the pancreas which may represent a complicated cyst although the possibility of a neoplasm could not be totally excluded. The need for further evaluation can be determined on a clinical basis. Bibasilar changes left greater than right likely representing some acute on chronic infiltrate/atelectasis. Critical Value/emergent results were called by telephone at the time of interpretation on 11/03/2015 at 3:54 pm to Dr. Jola Schmidt , who verbally acknowledged these results. Electronically Signed   By: Inez Catalina M.D.   On: 11/03/2015 15:56   Dg Abd Portable 1v  Result Date: 11/03/2015 CLINICAL DATA:  Nasogastric tube placement. EXAM: PORTABLE ABDOMEN - 1 VIEW COMPARISON:  CT 11/03/2015 FINDINGS: Nasogastric tube  has been placed, tip overlying the level of the proximal stomach and possibly within the hiatal hernia or proximal stomach. Bowel gas pattern shows persistent small bowel dilatation. Contrast is identified within the urinary bladder following CT exam. IMPRESSION: Nasogastric tube tip likely within the proximal stomach or hiatal hernia. Persistent dilatation of small bowel loops. Electronically Signed   By: Nolon Nations M.D.   On: 11/03/2015 17:03    EKG: Independently reviewed. Sinus rhythm  Assessment/Plan Principal Problem:   Small bowel obstruction (HCC) - intussception - gen surgery managing- NG tube placed  Active Problems: Acute hypoxic respiratory failure - aspiration??- received Rocephin and Zithromax in ER- will give Unasyn- allergy is rash- follow  - continue O2    Essential hypertension - change Lopressor to IV with holding parameters - hold Lisinopril/ HCTZ    Chronic combined systolic and diastolic CHF (congestive heart failure) - hold Lasix and ACE for now - for for decompenstaion    Compression fracture of L2 lumbar vertebra ([place])   - in Dec- wore a brace until the spring     DVT prophylaxis: Lovenox Code Status: Full  Family Communication:  Son Marya Amsler Disposition Plan: at lease 3-4 day hospital stay  Consults called: gen surgery  Admission status: admit    [facility] MD [facility] Pager: [email or web address] Password [email or web address] 7PM-7AM, please contact night-coverage   11/03/2015, 6:04 PM

## 2015-11-03 NOTE — ED Notes (Signed)
17:25 Pt can go to room.

## 2015-11-03 NOTE — Progress Notes (Signed)
Pharmacy Antibiotic Note  [name] is a 82 y.o. male admitted on 11/03/2015 with Aspiration PNA, received Rocephin/Zmax x1 in ED.  Pharmacy has been consulted for Unasyn dosing.  PMH cardiomyopathy, lung lobectomy 1980s  Plan:  Unasyn 3g IV q6 hr  F/u updated weight and adjust as appropriate for new calculated CrCl     Temp (24hrs), Avg:97.5 F (36.4 C), Min:97.5 F (36.4 C), Max:97.5 F (36.4 C)   Recent Labs Lab 11/03/15 1430 11/03/15 1438  WBC 6.9  --   CREATININE 1.17 1.10    CrCl cannot be calculated (Unknown ideal weight.).    Allergies  Allergen Reactions  . Ciprofloxacin Other (See Comments)    Reaction:  Unknown   . Penicillins Rash and Other (See Comments)    Has patient had a PCN reaction causing immediate rash, facial/tongue/throat swelling, SOB or lightheadedness with hypotension: No Has patient had a PCN reaction causing severe rash involving mucus membranes or skin necrosis: No Has patient had a PCN reaction that required hospitalization No Has patient had a PCN reaction occurring within the last 10 years: No If all of the above answers are "NO", then may proceed with Cephalosporin use.    Antimicrobials this admission: R/Z x 1 8/10 Unasyn 8/10 >>   Dose adjustments this admission: ---  Microbiology results: None ordered yet  Thank you for allowing pharmacy to be a part of this patient's care.  Reuel Boom, PharmD, BCPS Pager: [phone] 11/03/2015, 6:53 PM

## 2015-11-03 NOTE — ED Provider Notes (Signed)
[facility] DEPT Provider Note   CSN: [id number] Arrival date & time: 11/03/15  1330  First Provider Contact:  First MD Initiated Contact with Patient 11/03/15 1405        History   Chief Complaint Chief Complaint  Patient presents with  . Abdominal Pain    HPI [name] is a 82 y.o. male.  Patient presents emergency department with acute onset abdominal pain associated nausea vomiting today.  He has a history of cholecystectomy as well as hernia repair.  He reports to me that his never had a bowel obstruction before.  He denies hematemesis.  He denies diarrhea.  No fevers or chills.  He does report cough without shortness of breath.  He was noted by nursing staff to be 89% on room air and thus placed on oxygen.  At time of my evaluation the patients oxygen saturation is 82% on room air.  He does not wear oxygen at home.  He reports moderate to severe abdominal pain at this time.   The history is provided by the patient and medical records.  Abdominal Pain      Past Medical History:  Diagnosis Date  . BARRETTS ESOPHAGUS 11/19/2007  . Blood transfusion without reported diagnosis   . CARDIOMYOPATHY 08/02/2008  . CHANGE IN BOWELS 02/01/2009  . CHF 08/10/2008  . Diarrhea 01/31/2009  . DIVERTICULOSIS, COLON 11/19/2007  . GASTRITIS 11/19/2007  . GERD 11/19/2007  . Heart murmur   . HIATAL HERNIA 11/19/2007  . HTN (hypertension)   . HYPERLIPIDEMIA 06/27/2009  . HYPOTHYROIDISM 06/27/2009  . IBS (irritable bowel syndrome)   . MELENA 08/01/2009  . PERSONAL HX COLONIC POLYPS 02/01/2009  . TINEA CORPORIS 06/27/2009  . TOBACCO USE, QUIT 06/27/2009  . TREMOR, ESSENTIAL 06/27/2009    Patient Active Problem List   Diagnosis Date Noted  . Small bowel obstruction ([place]) 11/03/2015  . Compression fracture of L2 lumbar vertebra ([place]) 03/16/2015  . Abrasion 03/05/2015  . Lipoma of back 03/01/2015  . Hyponatremia 12/03/2013  . Leukocytosis, unspecified 12/02/2013  . Femoral neck fracture ([place])  11/30/2013  . Acute on chronic renal failure ([place]) 11/30/2013  . Chronic combined systolic and diastolic CHF (congestive heart failure) ([place]) 11/30/2013  . A-fib ([place]) 07/11/2013  . Protein-calorie malnutrition, severe ([place]) 07/10/2013  . Acute encephalopathy 07/10/2013  . CAP (community acquired pneumonia) 07/09/2013  . CKD (chronic kidney disease) stage 2, GFR 60-89 ml/min 07/09/2013  . Metabolic acidosis [id number]  . Septic shock ([place]) 07/09/2013  . BPH (benign prostatic hyperplasia) 02/04/2012  . MELENA 08/01/2009  . TINEA CORPORIS 06/27/2009  . Hyperlipemia 06/27/2009  . TREMOR, ESSENTIAL 06/27/2009  . HYPERTENSION 06/27/2009  . TOBACCO USE, QUIT 06/27/2009  . PERSONAL HX COLONIC POLYPS 02/01/2009  . DIARRHEA 01/31/2009  . GERD 11/19/2007  . BARRETTS ESOPHAGUS 11/19/2007  . GASTRITIS 11/19/2007  . HIATAL HERNIA 11/19/2007  . DIVERTICULOSIS, COLON 11/19/2007    Past Surgical History:  Procedure Laterality Date  . CHOLECYSTECTOMY    . ESOPHAGOGASTRODUODENOSCOPY N/A 04/28/2013   Procedure: ESOPHAGOGASTRODUODENOSCOPY (EGD);  Surgeon: Jerene Bears, MD;  Location: [facility];  Service: Gastroenterology;  Laterality: N/A;  . HERNIA REPAIR    . HIP ARTHROPLASTY Left 11/30/2013   Procedure: ARTHROPLASTY BIPOLAR HIP;  Surgeon: [name], MD;  Location: [facility];  Service: Orthopedics;  Laterality: Left;  . LUNG SURGERY  1984  . TONSILLECTOMY    . TONSILLECTOMY    . UMBILICAL HERNIA REPAIR    . VASECTOMY  Home Medications    Prior to Admission medications   Medication Sig Start Date End Date Taking? Authorizing Provider  acidophilus (RISAQUAD) CAPS capsule Take 1 capsule by mouth daily.   Yes Historical Provider, MD  baclofen (LIORESAL) 10 MG tablet Take 5 mg by mouth 3 (three) times daily.   Yes Historical Provider, MD  ENSURE PLUS (ENSURE PLUS) LIQD Take 237 mLs by mouth daily.   Yes Historical Provider, MD  esomeprazole (NEXIUM) 40 MG capsule Take 40 mg by  mouth daily before breakfast.   Yes Historical Provider, MD  furosemide (LASIX) 20 MG tablet Take 20 mg by mouth every other day.   Yes Historical Provider, MD  furosemide (LASIX) 40 MG tablet Take 40 mg by mouth every other day.   Yes Historical Provider, MD  lisinopril-hydrochlorothiazide (PRINZIDE,ZESTORETIC) 10-12.5 MG tablet Take 1 tablet by mouth daily.   Yes Historical Provider, MD  metoprolol succinate (TOPROL-XL) 25 MG 24 hr tablet Take 25 mg by mouth daily.   Yes Historical Provider, MD  Multiple Vitamins-Minerals (PRESERVISION AREDS 2) CAPS Take 1 capsule by mouth 2 (two) times daily.   Yes Historical Provider, MD  simvastatin (ZOCOR) 20 MG tablet Take 20 mg by mouth at bedtime.   Yes Historical Provider, MD  tamsulosin (FLOMAX) 0.4 MG CAPS capsule Take 0.4 mg by mouth daily after supper.   Yes Historical Provider, MD    Family History Family History  Problem Relation Age of Onset  . Heart disease Mother   . Colon cancer Father   . Prostate cancer Brother     Social History Social History  Substance Use Topics  . Smoking status: Former Smoker    Packs/day: 3.00    Years: 24.00    Types: Cigarettes    Quit date: 03/27/1943  . Smokeless tobacco: Never Used     Comment: Widowed x 2, has lady friend  . Alcohol use 0.6 oz/week    1 Standard drinks or equivalent per week     Comment: occassional     Allergies   Ciprofloxacin and Penicillins   Review of Systems Review of Systems  Gastrointestinal: Positive for abdominal pain.  All other systems reviewed and are negative.    Physical Exam Updated Vital Signs BP 176/93   Pulse 97   Temp 97.5 F (36.4 C) (Oral)   Resp 25   SpO2 97%   Physical Exam  Constitutional: He is oriented to person, place, and time. He appears well-developed and well-nourished.  HENT:  Head: Normocephalic and atraumatic.  Eyes: EOM are normal.  Neck: Normal range of motion.  Cardiovascular: Normal rate, regular rhythm and normal heart  sounds.   Pulmonary/Chest: Effort normal and breath sounds normal. No respiratory distress.  Abdominal: Soft. He exhibits no distension. There is tenderness.  Musculoskeletal: Normal range of motion.  Neurological: He is alert and oriented to person, place, and time.  Skin: Skin is warm and dry.  Psychiatric: He has a normal mood and affect. Judgment normal.  Nursing note and vitals reviewed.    ED Treatments / Results  Labs (all labs ordered are listed, but only abnormal results are displayed) Labs Reviewed  CBC WITH DIFFERENTIAL/PLATELET - Abnormal; Notable for the following:       Result Value   Hemoglobin 12.8 (*)    HCT 38.4 (*)    All other components within normal limits  COMPREHENSIVE METABOLIC PANEL - Abnormal; Notable for the following:    Glucose, Bld 171 (*)  BUN 30 (*)    Calcium 8.0 (*)    Total Protein 6.1 (*)    Albumin 3.2 (*)    AST 66 (*)    Alkaline Phosphatase 137 (*)    GFR calc non Af Amer 52 (*)    All other components within normal limits  I-STAT CHEM 8, ED - Abnormal; Notable for the following:    BUN 45 (*)    Glucose, Bld 168 (*)    Calcium, Ion 0.98 (*)    All other components within normal limits  LIPASE, BLOOD  URINALYSIS, ROUTINE W REFLEX MICROSCOPIC (NOT AT [facility])  CBC  CREATININE, SERUM    EKG  EKG Interpretation  Date/Time:  Thursday November 03 2015 13:52:02 EDT Ventricular Rate:  64 PR Interval:    QRS Duration: 111 QT Interval:  465 QTC Calculation: 480 R Axis:   -26 Text Interpretation:  Sinus rhythm Abnormal R-wave progression, late transition LVH with IVCD and secondary repol abnrm Borderline prolonged QT interval No significant change was found Confirmed by Travone Georg  MD, Lennette Bihari ([id number]) on 11/03/2015 3:12:04 PM       Radiology Ct Abdomen Pelvis W Contrast  Result Date: 11/03/2015 CLINICAL DATA:  Intermittent left lower quadrant pain for several hours, initial encounter EXAM: CT ABDOMEN AND PELVIS WITH CONTRAST TECHNIQUE:  Multidetector CT imaging of the abdomen and pelvis was performed using the standard protocol following bolus administration of intravenous contrast. CONTRAST:  62mL ISOVUE-300 IOPAMIDOL (ISOVUE-300) INJECTION 61% COMPARISON:  None. FINDINGS: Lower chest: Calcified pleural plaques are identified bilaterally. Mild bilateral lower lobe atelectatic changes are seen. No sizable effusion is noted. Hepatobiliary: Gallbladder is been surgically removed. The liver is within normal limits. Pancreas: There is 11 mm hypodensity noted in the head of the pancreas best seen on image number 31 of series 2. Although this may represent a pancreatic cyst the possibility of small pancreatic lesion could not be totally excluded. Spleen: Small splenic cyst is noted. Adrenals/Urinary Tract: No masses identified. No evidence of hydronephrosis. Stomach/Bowel: There is small bowel dilatation with some mild inflammatory changes surrounding the small bowel. This obstructive pattern continues into the right lower quadrant head which point there is an intussusception of the ileum within the ileum. No definitive mass lesion is seen. Small bowel beyond this intussusception is within normal limits. Scattered small bowel diverticula are noted. A large fluid containing diverticulum of the duodenum is seen adjacent to the pancreatic head. Moderate-sized hiatal hernia is noted. Vascular/Lymphatic: No pathologically enlarged lymph nodes. No evidence of abdominal aortic aneurysm. Reproductive: No mass or other significant abnormality. Other: Mild free fluid is noted within the pelvis. Musculoskeletal: Postsurgical changes in the left hip are noted. Chronic compression deformities at T12 and L2 are noted IMPRESSION: Small-bowel obstruction secondary to an ileal to ileal intussusception in the right lower quadrant. Some inflammatory changes are noted surrounding the dilated loops in the left mid abdomen. No evidence of perforation is identified.  Hypodensity within the head of the pancreas which may represent a complicated cyst although the possibility of a neoplasm could not be totally excluded. The need for further evaluation can be determined on a clinical basis. Bibasilar changes left greater than right likely representing some acute on chronic infiltrate/atelectasis. Critical Value/emergent results were called by telephone at the time of interpretation on 11/03/2015 at 3:54 pm to Dr. Jola Schmidt , who verbally acknowledged these results. Electronically Signed   By: Inez Catalina M.D.   On: 11/03/2015 15:56   Dg Abd  Portable 1v  Result Date: 11/03/2015 CLINICAL DATA:  Nasogastric tube placement. EXAM: PORTABLE ABDOMEN - 1 VIEW COMPARISON:  CT 11/03/2015 FINDINGS: Nasogastric tube has been placed, tip overlying the level of the proximal stomach and possibly within the hiatal hernia or proximal stomach. Bowel gas pattern shows persistent small bowel dilatation. Contrast is identified within the urinary bladder following CT exam. IMPRESSION: Nasogastric tube tip likely within the proximal stomach or hiatal hernia. Persistent dilatation of small bowel loops. Electronically Signed   By: Nolon Nations M.D.   On: 11/03/2015 17:03    Procedures Procedures (including critical care time)  Medications Ordered in ED Medications  0.9 %  sodium chloride infusion (1,000 mLs Intravenous New Bag/Given 11/03/15 1437)    Followed by  0.9 %  sodium chloride infusion (1,000 mLs Intravenous New Bag/Given 11/03/15 1758)  azithromycin (ZITHROMAX) 500 mg in dextrose 5 % 250 mL IVPB (500 mg Intravenous New Bag/Given 11/03/15 1758)  enoxaparin (LOVENOX) injection 40 mg (not administered)  0.9 %  sodium chloride infusion (not administered)  acetaminophen (TYLENOL) tablet 650 mg (not administered)    Or  acetaminophen (TYLENOL) suppository 650 mg (not administered)  ondansetron (ZOFRAN) tablet 4 mg (not administered)    Or  ondansetron (ZOFRAN) injection 4 mg  (not administered)  metoprolol (LOPRESSOR) injection 2.5 mg (not administered)  morphine 2 MG/ML injection 2 mg (not administered)  morphine 4 MG/ML injection 4 mg (4 mg Intravenous Given 11/03/15 1438)  ondansetron (ZOFRAN) injection 4 mg (4 mg Intravenous Given 11/03/15 1440)  iopamidol (ISOVUE-300) 61 % injection 100 mL (80 mLs Intravenous Contrast Given 11/03/15 1507)  cefTRIAXone (ROCEPHIN) 1 g in dextrose 5 % 50 mL IVPB (1 g Intravenous New Bag/Given 11/03/15 1606)     Initial Impression / Assessment and Plan / ED Course  I have reviewed the triage vital signs and the nursing notes.  Pertinent labs & imaging results that were available during my care of the patient were reviewed by me and considered in my medical decision making (see chart for details).  Clinical Course      Final Clinical Impressions(s) / ED Diagnoses   Final diagnoses:  Small bowel obstruction ([place])  CAP (community acquired pneumonia)    New Prescriptions New Prescriptions   No medications on file     Jola Schmidt, MD 11/03/15 1806

## 2015-11-03 NOTE — Consult Note (Signed)
Reason for Consult: Small bowel obstruction  Referring Physician: Dr. Jola Schmidt emergency Department PCP:  [facility] - Dr. Vergia Alberts is an 82 y.o. male.  HPI: Patient is a 82 year old male who developed abdominal pain around 11 AM this morning. He had gone to [employer], then visited his girl friend who is at a memory care unit, then went to [employer].  He had eaten very little at [facility] when he developed acute abdominal pain.  He reported nausea and vomiting EMS was called and he was treated with Zofran. O2 sats are 89% on room air. He was transported to the emergency room at [facility].  Workup in the emergency room shows he is afebrile and vital signs are stable. Sat was 89% on room air on admission. Labs show white count of 6.9 hemoglobin and hematocrit are 13.9 and 41 on repeat, platelets are 216,000. Electrolytes are normal BUN is elevated at 45 glucose was elevated. LFTs were normal on admission.   CT of the abdomen and pelvis were obtained. This shows a small bowel obstruction secondary to an ileal ileal intussusception in the right lower quadrant with some inflammatory changes surrounding the dilated loops the mid left abdomen. No evidence of perforation. There is also an area at the head of the pancreas that may represent a cyst although neoplasm could not be ruled out. He also has bibasilar changes left greater than right some of which may represent a pneumonia.   He is being admitted by the medical service and we are asked to see in consultation for the small bowel obstruction.  Past Medical History:  Diagnosis Date  . BARRETTS ESOPHAGUS 11/19/2007  . Blood transfusion without reported diagnosis   . CARDIOMYOPATHY 08/02/2008  . CHANGE IN BOWELS 02/01/2009  . CHF 08/10/2008  . Diarrhea 01/31/2009  . DIVERTICULOSIS, COLON 11/19/2007  . GASTRITIS 11/19/2007  . GERD 11/19/2007  . Heart murmur   . HIATAL HERNIA 11/19/2007  . HTN (hypertension)   .  HYPERLIPIDEMIA 06/27/2009  . HYPOTHYROIDISM 06/27/2009  . IBS (irritable bowel syndrome)   . MELENA 08/01/2009  . PERSONAL HX COLONIC POLYPS 02/01/2009  . TINEA CORPORIS 06/27/2009  . TOBACCO USE, QUIT 06/27/2009  . TREMOR, ESSENTIAL 06/27/2009    Past Surgical History:  Procedure Laterality Date  . CHOLECYSTECTOMY    . ESOPHAGOGASTRODUODENOSCOPY N/A 04/28/2013   Procedure: ESOPHAGOGASTRODUODENOSCOPY (EGD);  Surgeon: Jerene Bears, MD;  Location: [facility];  Service: Gastroenterology;  Laterality: N/A;  . HERNIA REPAIR    . HIP ARTHROPLASTY Left 11/30/2013   Procedure: ARTHROPLASTY BIPOLAR HIP;  Surgeon: [name], MD;  Location: [facility];  Service: Orthopedics;  Laterality: Left;  . LUNG SURGERY  1984  . TONSILLECTOMY    . TONSILLECTOMY    . UMBILICAL HERNIA REPAIR    . VASECTOMY      Family History  Problem Relation Age of Onset  . Heart disease Mother   . Colon cancer Father   . Prostate cancer Brother     Social History:  reports that he quit smoking about 72 years ago. His smoking use included Cigarettes. He has a 72.00 pack-year smoking history. He has never used smokeless tobacco. He reports that he drinks about 0.6 oz of alcohol per week . He reports that he does not use drugs.  Allergies:  Allergies  Allergen Reactions  . Ciprofloxacin Other (See Comments)    Reaction:  Unknown   . Penicillins Rash and Other (  See Comments)    Has patient had a PCN reaction causing immediate rash, facial/tongue/throat swelling, SOB or lightheadedness with hypotension: No Has patient had a PCN reaction causing severe rash involving mucus membranes or skin necrosis: No Has patient had a PCN reaction that required hospitalization No Has patient had a PCN reaction occurring within the last 10 years: No If all of the above answers are "NO", then may proceed with Cephalosporin use.    Prior to Admission medications   Medication Sig Start Date End Date Taking? Authorizing Provider  acidophilus  (RISAQUAD) CAPS capsule Take 1 capsule by mouth daily.   Yes Historical Provider, MD  baclofen (LIORESAL) 10 MG tablet Take 5 mg by mouth 3 (three) times daily.   Yes Historical Provider, MD  ENSURE PLUS (ENSURE PLUS) LIQD Take 237 mLs by mouth daily.   Yes Historical Provider, MD  esomeprazole (NEXIUM) 40 MG capsule Take 40 mg by mouth daily before breakfast.   Yes Historical Provider, MD  furosemide (LASIX) 20 MG tablet Take 20 mg by mouth every other day.   Yes Historical Provider, MD  furosemide (LASIX) 40 MG tablet Take 40 mg by mouth every other day.   Yes Historical Provider, MD  lisinopril-hydrochlorothiazide (PRINZIDE,ZESTORETIC) 10-12.5 MG tablet Take 1 tablet by mouth daily.   Yes Historical Provider, MD  metoprolol succinate (TOPROL-XL) 25 MG 24 hr tablet Take 25 mg by mouth daily.   Yes Historical Provider, MD  Multiple Vitamins-Minerals (PRESERVISION AREDS 2) CAPS Take 1 capsule by mouth 2 (two) times daily.   Yes Historical Provider, MD  simvastatin (ZOCOR) 20 MG tablet Take 20 mg by mouth at bedtime.   Yes Historical Provider, MD  tamsulosin (FLOMAX) 0.4 MG CAPS capsule Take 0.4 mg by mouth daily after supper.   Yes Historical Provider, MD     Results for orders placed or performed during the hospital encounter of 11/03/15 (from the past 48 hour(s))  CBC with Differential/Platelet     Status: Abnormal   Collection Time: 11/03/15  2:30 PM  Result Value Ref Range   WBC 6.9 4.0 - 10.5 K/uL   RBC 4.24 4.22 - 5.81 MIL/uL   Hemoglobin 12.8 (L) 13.0 - 17.0 g/dL   HCT 38.4 (L) 39.0 - 52.0 %   MCV 90.6 78.0 - 100.0 fL   MCH 30.2 26.0 - 34.0 pg   MCHC 33.3 30.0 - 36.0 g/dL   RDW 14.5 11.5 - 15.5 %   Platelets 216 150 - 400 K/uL   Neutrophils Relative % 77 %   Neutro Abs 5.2 1.7 - 7.7 K/uL   Lymphocytes Relative 14 %   Lymphs Abs 1.0 0.7 - 4.0 K/uL   Monocytes Relative 6 %   Monocytes Absolute 0.4 0.1 - 1.0 K/uL   Eosinophils Relative 3 %   Eosinophils Absolute 0.2 0.0 - 0.7  K/uL   Basophils Relative 0 %   Basophils Absolute 0.0 0.0 - 0.1 K/uL  Comprehensive metabolic panel     Status: Abnormal   Collection Time: 11/03/15  2:30 PM  Result Value Ref Range   Sodium 138 135 - 145 mmol/L   Potassium 4.7 3.5 - 5.1 mmol/L   Chloride 104 101 - 111 mmol/L   CO2 25 22 - 32 mmol/L   Glucose, Bld 171 (H) 65 - 99 mg/dL   BUN 30 (H) 6 - 20 mg/dL   Creatinine, Ser 1.17 0.61 - 1.24 mg/dL   Calcium 8.0 (L) 8.9 - 10.3 mg/dL  Total Protein 6.1 (L) 6.5 - 8.1 g/dL   Albumin 3.2 (L) 3.5 - 5.0 g/dL   AST 66 (H) 15 - 41 U/L   ALT 42 17 - 63 U/L   Alkaline Phosphatase 137 (H) 38 - 126 U/L   Total Bilirubin 1.2 0.3 - 1.2 mg/dL   GFR calc non Af Amer 52 (L) >60 mL/min   GFR calc Af Amer >60 >60 mL/min    Comment: (NOTE) The eGFR has been calculated using the CKD EPI equation. This calculation has not been validated in all clinical situations. eGFR's persistently <60 mL/min signify possible Chronic Kidney Disease.    Anion gap 9 5 - 15  Lipase, blood     Status: None   Collection Time: 11/03/15  2:30 PM  Result Value Ref Range   Lipase 30 11 - 51 U/L  I-Stat Chem 8, ED     Status: Abnormal   Collection Time: 11/03/15  2:38 PM  Result Value Ref Range   Sodium 140 135 - 145 mmol/L   Potassium 4.6 3.5 - 5.1 mmol/L   Chloride 102 101 - 111 mmol/L   BUN 45 (H) 6 - 20 mg/dL   Creatinine, Ser 1.10 0.61 - 1.24 mg/dL   Glucose, Bld 168 (H) 65 - 99 mg/dL   Calcium, Ion 0.98 (L) 1.12 - 1.23 mmol/L   TCO2 29 0 - 100 mmol/L   Hemoglobin 13.9 13.0 - 17.0 g/dL   HCT 41.0 39.0 - 52.0 %    Ct Abdomen Pelvis W Contrast  Result Date: 11/03/2015 CLINICAL DATA:  Intermittent left lower quadrant pain for several hours, initial encounter EXAM: CT ABDOMEN AND PELVIS WITH CONTRAST TECHNIQUE: Multidetector CT imaging of the abdomen and pelvis was performed using the standard protocol following bolus administration of intravenous contrast. CONTRAST:  67m ISOVUE-300 IOPAMIDOL (ISOVUE-300)  INJECTION 61% COMPARISON:  None. FINDINGS: Lower chest: Calcified pleural plaques are identified bilaterally. Mild bilateral lower lobe atelectatic changes are seen. No sizable effusion is noted. Hepatobiliary: Gallbladder is been surgically removed. The liver is within normal limits. Pancreas: There is 11 mm hypodensity noted in the head of the pancreas best seen on image number 31 of series 2. Although this may represent a pancreatic cyst the possibility of small pancreatic lesion could not be totally excluded. Spleen: Small splenic cyst is noted. Adrenals/Urinary Tract: No masses identified. No evidence of hydronephrosis. Stomach/Bowel: There is small bowel dilatation with some mild inflammatory changes surrounding the small bowel. This obstructive pattern continues into the right lower quadrant head which point there is an intussusception of the ileum within the ileum. No definitive mass lesion is seen. Small bowel beyond this intussusception is within normal limits. Scattered small bowel diverticula are noted. A large fluid containing diverticulum of the duodenum is seen adjacent to the pancreatic head. Moderate-sized hiatal hernia is noted. Vascular/Lymphatic: No pathologically enlarged lymph nodes. No evidence of abdominal aortic aneurysm. Reproductive: No mass or other significant abnormality. Other: Mild free fluid is noted within the pelvis. Musculoskeletal: Postsurgical changes in the left hip are noted. Chronic compression deformities at T12 and L2 are noted IMPRESSION: Small-bowel obstruction secondary to an ileal to ileal intussusception in the right lower quadrant. Some inflammatory changes are noted surrounding the dilated loops in the left mid abdomen. No evidence of perforation is identified. Hypodensity within the head of the pancreas which may represent a complicated cyst although the possibility of a neoplasm could not be totally excluded. The need for further evaluation can be  determined on a  clinical basis. Bibasilar changes left greater than right likely representing some acute on chronic infiltrate/atelectasis. Critical Value/emergent results were called by telephone at the time of interpretation on 11/03/2015 at 3:54 pm to Dr. Jola Schmidt , who verbally acknowledged these results. Electronically Signed   By: Inez Catalina M.D.   On: 11/03/2015 15:56    Review of Systems  Unable to perform ROS: Other  Constitutional: Negative.   HENT: Negative.   Eyes: Negative.   Respiratory: Negative.   Cardiovascular: Negative.   Gastrointestinal: Positive for abdominal pain, nausea and vomiting.  Genitourinary: Negative.        Wears depends  Musculoskeletal: Negative.   Skin: Negative.   Neurological: Negative.   Endo/Heme/Allergies: Negative.   Psychiatric/Behavioral: Negative.   All other systems reviewed and are negative.  Blood pressure 157/79, pulse 78, temperature 97.5 F (36.4 C), temperature source Oral, resp. rate 23, SpO2 (!) 89 %. Physical Exam  Constitutional: He is oriented to person, place, and time.  Chronically ill male in acute distress with ongoing severe abdominal pain.  HENT:  Head: Normocephalic and atraumatic.  He is extremely hard of hearing you almost have to yell for him to hear you.  Eyes: Right eye exhibits no discharge. Left eye exhibits no discharge. No scleral icterus.  Neck: Normal range of motion. Neck supple. No JVD present. No tracheal deviation present. No thyromegaly present.  Cardiovascular: Normal rate, regular rhythm and normal heart sounds.   No murmur heard. Respiratory: Effort normal. No respiratory distress. He has no wheezes (Bilateral rales both bases). He has rales (both bases). He exhibits no tenderness.  GI: He exhibits distension. There is tenderness. There is rebound.  Distended, very painful, and he cannot focus on much aside from his pain right now.    Scar at the umbilicus  Genitourinary:  Genitourinary Comments: He wears  depends, but I could not really tell if its urinary or rectal incontinence.  Musculoskeletal: He exhibits no edema.  Lymphadenopathy:    He has no cervical adenopathy.  Neurological: He is alert and oriented to person, place, and time. No cranial nerve deficit.  Skin: Skin is warm and dry. No rash noted. No erythema. No pallor.  Psychiatric:  He is pretty distraught with abdominal pain.      Assessment/Plan: SBO with ilieal to ileal intussusception Probable pneumonia vs CHF  (EF 45% on ECHO 07/14/14) Abnormal pancreas on CT Hx of tobacco use Hypothyroidism  Hypertension  Hyperlipidemia BPH Left femoral neck fx - 04/29/4008 - [facility] Umbilical hernia repair - 06/2008 - [facility]  Plan:  Agree with NG, NPO, hydration and bowel rest.     We will follow with you.  If he does not open up soon, he may need surgery soon.    Medical management tonight.     JENNINGS,WILLARD 11/03/2015, 4:01 PM   Agree with above.  Acute small bowel obstruction onset today with probable aspiration pneumonia. The patient is very hard of hearing, but very functional, and is driving.  No prior abdominal issues or symptoms.    Son, Tavarus Poteete (cell: [phone]), at bedside. Marya Amsler has a brother who lives in [place] and 2 half brothers who live in [place].  Plan:  Stabilize medically - aspiration appears significant. Re-evaluate in AM.  If abdominal exam no better, will probably require surgery, if he can tolerate it.    Discussed thoroughly with his son.  Alphonsa Overall, MD, [facility] Surgery Pager: [phone] Office phone:  [phone]  Agree with above.  Alphonsa Overall, MD, [facility] Surgery Pager: [phone] Office phone:  [phone]

## 2015-11-03 NOTE — ED Notes (Signed)
Resa Miner: Son [phone]

## 2015-11-04 ENCOUNTER — Inpatient Hospital Stay (HOSPITAL_COMMUNITY): Payer: Medicare Other

## 2015-11-04 DIAGNOSIS — K561 Intussusception: Secondary | ICD-10-CM

## 2015-11-04 LAB — CBC
HCT: 37.5 % — ABNORMAL LOW (ref 39.0–52.0)
Hemoglobin: 12.6 g/dL — ABNORMAL LOW (ref 13.0–17.0)
MCH: 30.2 pg (ref 26.0–34.0)
MCHC: 33.6 g/dL (ref 30.0–36.0)
MCV: 89.9 fL (ref 78.0–100.0)
PLATELETS: 213 10*3/uL (ref 150–400)
RBC: 4.17 MIL/uL — AB (ref 4.22–5.81)
RDW: 14.6 % (ref 11.5–15.5)
WBC: 11.6 10*3/uL — AB (ref 4.0–10.5)

## 2015-11-04 LAB — LACTIC ACID, PLASMA: Lactic Acid, Venous: 1.7 mmol/L (ref 0.5–1.9)

## 2015-11-04 LAB — BASIC METABOLIC PANEL
Anion gap: 8 (ref 5–15)
BUN: 32 mg/dL — AB (ref 6–20)
CALCIUM: 7.6 mg/dL — AB (ref 8.9–10.3)
CO2: 28 mmol/L (ref 22–32)
CREATININE: 1.3 mg/dL — AB (ref 0.61–1.24)
Chloride: 106 mmol/L (ref 101–111)
GFR calc non Af Amer: 46 mL/min — ABNORMAL LOW (ref >60)
GFR, EST AFRICAN AMERICAN: 53 mL/min — AB (ref >60)
Glucose, Bld: 96 mg/dL (ref 65–99)
Potassium: 3.6 mmol/L (ref 3.5–5.1)
SODIUM: 142 mmol/L (ref 135–145)

## 2015-11-04 LAB — MRSA PCR SCREENING: MRSA by PCR: NEGATIVE

## 2015-11-04 MED ORDER — SODIUM CHLORIDE 0.9 % IV BOLUS (SEPSIS): 1000.0000 mL | Freq: Once | INTRAVENOUS | Status: DC

## 2015-11-04 MED ORDER — FAMOTIDINE IN NACL 20-0.9 MG/50ML-% IV SOLN
20.0000 mg | Freq: Two times a day (BID) | INTRAVENOUS | Status: DC
  Administered 2015-11-04 – 2015-11-05 (×2): 20 mg via INTRAVENOUS
  Filled 2015-11-04 (×2): qty 50

## 2015-11-04 MED ORDER — SODIUM CHLORIDE 0.9 % IV BOLUS (SEPSIS)
250.0000 mL | INTRAVENOUS | Status: AC | PRN
  Administered 2015-11-04 (×2): 250 mL via INTRAVENOUS
  Filled 2015-11-04: qty 250

## 2015-11-04 NOTE — Progress Notes (Signed)
PROGRESS NOTE    [name]  [id number] DOB: 02-24-1922 DOA: 11/03/2015  PCP: Jenny Reichmann, MD   Brief Narrative:  [name] is a 82 y.o. male with medical history significant of cardiomyopathy, lung lobectomy in 1980s, Barretts esophagus, vertebral compression fracture who developed sudden onset abdominal pain and vomited once while at [employer] around 11 pm. No fever or chills.  Found to have intussusception of ileum. Also found to be hypoxic and thought to have aspirated while vomiting.   Subjective: No pain, dyspnea, nausea or cough.   Assessment & Plan:   Principal Problem:   Small bowel obstruction  - due to intussusception - appears to be resolving- surgery following  Active Problems: Acute hypoxic resp failure - ? Aspiration pneumonia- cont Unasyn- repeat CXR today shows bibasilar opacities with hypo expended lungs- ? Atelectasis - cont Unasyn for now- still requiring 4 L O2  AKI - due to dehydration/ hypotension? - cont slow hydration and follow I and O- appears to be developing pleural effusions   Chronic combined systolic and diastolic CHF (congestive heart failure) - EF 45 %, gr 1 d CHF - compensated- monitor I and O  HTN - Lasix, Lisinopril/ HCTZ and Metoprolol on hold  Gastritis/ GERD? - PPI on hold as shortage of IV Protonix and unable to give oral PPI - Pepcid IV   DVT prophylaxis: Lovenox Code Status: Full code Family Communication: son, Marya Amsler Disposition Plan: to be determined Consultants:   gen surgery Procedures:    Antimicrobials:  Anti-infectives    Start     Dose/Rate Route Frequency Ordered Stop   11/03/15 1930  Ampicillin-Sulbactam (UNASYN) 3 g in sodium chloride 0.9 % 100 mL IVPB     3 g 100 mL/hr over 60 Minutes Intravenous Every 6 hours 11/03/15 1848     11/03/15 1600  cefTRIAXone (ROCEPHIN) 1 g in dextrose 5 % 50 mL IVPB     1 g 100 mL/hr over 30 Minutes Intravenous  Once 11/03/15 1558 11/03/15 1636   11/03/15  1600  azithromycin (ZITHROMAX) 500 mg in dextrose 5 % 250 mL IVPB     500 mg 250 mL/hr over 60 Minutes Intravenous  Once 11/03/15 1558 11/03/15 1858       Objective: Vitals:   11/04/15 1150 11/04/15 1200 11/04/15 1300 11/04/15 1400  BP: (!) 103/43 109/67 (!) 114/48 (!) 103/53  Pulse: 75 73 73 73  Resp: (!) 23 (!) 24 (!) 23 (!) 22  Temp:  98.1 F (36.7 C)    TempSrc:  Oral    SpO2: 90% 92% 93% 93%  Weight:      Height:        Intake/Output Summary (Last 24 hours) at 11/04/15 1430 Last data filed at 11/04/15 1200  Gross per 24 hour  Intake             3000 ml  Output             1775 ml  Net             1225 ml   Filed Weights   11/03/15 1930  Weight: 62.8 kg (138 lb 7.2 oz)    Examination: General exam: Appears comfortable  HEENT: PERRLA, oral mucosa moist, no sclera icterus or thrush Respiratory system: Clear to auscultation. Respiratory effort normal. Pulse ox 92% on 4 L Cardiovascular system: S1 & S2 heard, RRR.  No murmurs  Gastrointestinal system: Abdomen soft, non-tender, nondistended. Audible but decreased bowel sound.  NG tube in place.  No organomegaly Central nervous system: Alert and oriented. No focal neurological deficits. Extremities: No cyanosis, clubbing or edema Skin: No rashes or ulcers Psychiatry:  Mood & affect appropriate.     Data Reviewed: I have personally reviewed following labs and imaging studies  CBC:  Recent Labs Lab 11/03/15 1430 11/03/15 1438 11/04/15 0834  WBC 6.9  --  11.6*  NEUTROABS 5.2  --   --   HGB 12.8* 13.9 12.6*  HCT 38.4* 41.0 37.5*  MCV 90.6  --  89.9  PLT 216  --  123456   Basic Metabolic Panel:  Recent Labs Lab 11/03/15 1430 11/03/15 1438 11/04/15 0834  NA 138 140 142  K 4.7 4.6 3.6  CL 104 102 106  CO2 25  --  28  GLUCOSE 171* 168* 96  BUN 30* 45* 32*  CREATININE 1.17 1.10 1.30*  CALCIUM 8.0*  --  7.6*   GFR: Estimated Creatinine Clearance: 31.5 mL/min (by C-G formula based on SCr of 1.3  mg/dL). Liver Function Tests:  Recent Labs Lab 11/03/15 1430  AST 66*  ALT 42  ALKPHOS 137*  BILITOT 1.2  PROT 6.1*  ALBUMIN 3.2*    Recent Labs Lab 11/03/15 1430  LIPASE 30   No results for input(s): AMMONIA in the last 168 hours. Coagulation Profile: No results for input(s): INR, PROTIME in the last 168 hours. Cardiac Enzymes: No results for input(s): CKTOTAL, CKMB, CKMBINDEX, TROPONINI in the last 168 hours. BNP (last 3 results) No results for input(s): PROBNP in the last 8760 hours. HbA1C: No results for input(s): HGBA1C in the last 72 hours. CBG: No results for input(s): GLUCAP in the last 168 hours. Lipid Profile: No results for input(s): CHOL, HDL, LDLCALC, TRIG, CHOLHDL, LDLDIRECT in the last 72 hours. Thyroid Function Tests: No results for input(s): TSH, T4TOTAL, FREET4, T3FREE, THYROIDAB in the last 72 hours. Anemia Panel: No results for input(s): VITAMINB12, FOLATE, FERRITIN, TIBC, IRON, RETICCTPCT in the last 72 hours. Urine analysis:    Component Value Date/Time   COLORURINE YELLOW 11/03/2015 [place] 11/03/2015 1420   LABSPEC 1.038 (H) 11/03/2015 1420   PHURINE 6.0 11/03/2015 1420   GLUCOSEU NEGATIVE 11/03/2015 1420   HGBUR NEGATIVE 11/03/2015 1420   BILIRUBINUR NEGATIVE 11/03/2015 1420   BILIRUBINUR neg 08/18/2013 0848   KETONESUR NEGATIVE 11/03/2015 1420   PROTEINUR NEGATIVE 11/03/2015 1420   UROBILINOGEN 0.2 11/30/2013 0804   NITRITE NEGATIVE 11/03/2015 1420   LEUKOCYTESUR NEGATIVE 11/03/2015 1420   Sepsis Labs: @LABRCNTIP (procalcitonin:4,lacticidven:4) ) Recent Results (from the past 240 hour(s))  MRSA PCR Screening     Status: None   Collection Time: 11/03/15  9:50 PM  Result Value Ref Range Status   MRSA by PCR NEGATIVE NEGATIVE Final    Comment:        The GeneXpert MRSA Assay (FDA approved for NASAL specimens only), is one component of a comprehensive MRSA colonization surveillance program. It is not intended to  diagnose MRSA infection nor to guide or monitor treatment for MRSA infections.          Radiology Studies: Ct Abdomen Pelvis W Contrast  Result Date: 11/03/2015 CLINICAL DATA:  Intermittent left lower quadrant pain for several hours, initial encounter EXAM: CT ABDOMEN AND PELVIS WITH CONTRAST TECHNIQUE: Multidetector CT imaging of the abdomen and pelvis was performed using the standard protocol following bolus administration of intravenous contrast. CONTRAST:  46mL ISOVUE-300 IOPAMIDOL (ISOVUE-300) INJECTION 61% COMPARISON:  None. FINDINGS: Lower chest: Calcified  pleural plaques are identified bilaterally. Mild bilateral lower lobe atelectatic changes are seen. No sizable effusion is noted. Hepatobiliary: Gallbladder is been surgically removed. The liver is within normal limits. Pancreas: There is 11 mm hypodensity noted in the head of the pancreas best seen on image number 31 of series 2. Although this may represent a pancreatic cyst the possibility of small pancreatic lesion could not be totally excluded. Spleen: Small splenic cyst is noted. Adrenals/Urinary Tract: No masses identified. No evidence of hydronephrosis. Stomach/Bowel: There is small bowel dilatation with some mild inflammatory changes surrounding the small bowel. This obstructive pattern continues into the right lower quadrant head which point there is an intussusception of the ileum within the ileum. No definitive mass lesion is seen. Small bowel beyond this intussusception is within normal limits. Scattered small bowel diverticula are noted. A large fluid containing diverticulum of the duodenum is seen adjacent to the pancreatic head. Moderate-sized hiatal hernia is noted. Vascular/Lymphatic: No pathologically enlarged lymph nodes. No evidence of abdominal aortic aneurysm. Reproductive: No mass or other significant abnormality. Other: Mild free fluid is noted within the pelvis. Musculoskeletal: Postsurgical changes in the left hip are  noted. Chronic compression deformities at T12 and L2 are noted IMPRESSION: Small-bowel obstruction secondary to an ileal to ileal intussusception in the right lower quadrant. Some inflammatory changes are noted surrounding the dilated loops in the left mid abdomen. No evidence of perforation is identified. Hypodensity within the head of the pancreas which may represent a complicated cyst although the possibility of a neoplasm could not be totally excluded. The need for further evaluation can be determined on a clinical basis. Bibasilar changes left greater than right likely representing some acute on chronic infiltrate/atelectasis. Critical Value/emergent results were called by telephone at the time of interpretation on 11/03/2015 at 3:54 pm to Dr. Jola Schmidt , who verbally acknowledged these results. Electronically Signed   By: Inez Catalina M.D.   On: 11/03/2015 15:56   Dg Chest Port 1 View  Result Date: 11/04/2015 CLINICAL DATA:  Nasogastric tube placement.  Initial encounter. EXAM: PORTABLE CHEST 1 VIEW COMPARISON:  Chest radiograph performed 02/10/2014 FINDINGS: The patient's enteric tube is seen ending overlying the body of the stomach, with the side port about the gastroesophageal junction. This could be advanced several centimeters, as deemed clinically appropriate. The lungs are hypoexpanded. Bibasilar airspace opacities may reflect atelectasis or pneumonia. Small bilateral pleural effusions are suspected. No pneumothorax is seen. The cardiomediastinal silhouette is borderline normal in size. No acute osseous abnormalities are identified. Clips are noted within the right upper quadrant, reflecting prior cholecystectomy. IMPRESSION: 1. Enteric tube noted ending overlying the body of the stomach, with the side port about the gastroesophageal junction. This could be advanced several centimeters, as deemed clinically appropriate. 2. Lungs hypoexpanded. Bibasilar airspace opacities may reflect atelectasis or  pneumonia. Small bilateral pleural effusions suspected. Electronically Signed   By: Garald Balding M.D.   On: 11/04/2015 05:56   Dg Abd 2 Views  Result Date: 11/04/2015 CLINICAL DATA:  Check nasogastric catheter placement EXAM: ABDOMEN - 2 VIEW COMPARISON:  11/03/2015 FINDINGS: Scattered large and small bowel gas is noted. No obstructive changes seen. Bladder is well distended with contrast opacified urine. Left hip replacement is noted. A nasogastric catheter is noted within the stomach just beyond the gastroesophageal junction. No free air is seen. Pleural plaques are noted bilaterally. IMPRESSION: Nasogastric catheter within the stomach. The degree of small bowel dilatation has improved from the prior CT examination. Electronically  Signed   By: Inez Catalina M.D.   On: 11/04/2015 08:17   Dg Abd Portable 1v  Result Date: 11/03/2015 CLINICAL DATA:  Nasogastric tube placement. EXAM: PORTABLE ABDOMEN - 1 VIEW COMPARISON:  CT 11/03/2015 FINDINGS: Nasogastric tube has been placed, tip overlying the level of the proximal stomach and possibly within the hiatal hernia or proximal stomach. Bowel gas pattern shows persistent small bowel dilatation. Contrast is identified within the urinary bladder following CT exam. IMPRESSION: Nasogastric tube tip likely within the proximal stomach or hiatal hernia. Persistent dilatation of small bowel loops. Electronically Signed   By: Nolon Nations M.D.   On: 11/03/2015 17:03      Scheduled Meds: . ampicillin-sulbactam (UNASYN) IV  3 g Intravenous Q6H  . enoxaparin (LOVENOX) injection  40 mg Subcutaneous Q24H  . sodium chloride  1,000 mL Intravenous Once   Continuous Infusions: . sodium chloride Stopped (11/04/15 0011)  . sodium chloride 100 mL/hr at 11/04/15 1200     LOS: 1 day    Time spent in minutes: 20    [place], MD [facility] Pager: [email or web address] Password [email or web address] 11/04/2015, 2:30 PM

## 2015-11-04 NOTE — Progress Notes (Signed)
Patient received 258ml normal saline bolus twice thru the night for low blood pressure.

## 2015-11-04 NOTE — Progress Notes (Signed)
[facility]:  [phone] General Surgery Progress Note   LOS: 1 day  POD -     Assessment/Plan: 1.  Intussusception with SBO  Appears clinically better  Continue NGT for now - much less abdominal pain that yesterday.  Hopefully this will resolve spontaneously.  2.  Aspiration pneumonia  Better sats today  WBC - 11,600 - 11/04/2015  Unasyn - 8/10 >>>   3.  Cardiomyopathy - Chronoic systoiolc CHF  Followed by Dr. Dorris Carnes 4.  Vertebral compression fx - L2 5.  History of Barrett's esophagus  Has been seen by Dr. Hilarie Fredrickson.  6.  DVT prophylaxis - Lovenox  Principal Problem:   Small bowel obstruction (HCC) Active Problems:   Essential hypertension   Chronic combined systolic and diastolic CHF (congestive heart failure) (HCC)   Compression fracture of L2 lumbar vertebra (HCC)   Intussusception (HCC)  Subjective:  Is joking and looks much better than last PM.  He still cannot hear at all, so it is very hard to interview him or carry on a conversation.  He has two sons at bedside.  One is Marya Amsler, whom he lives with.  Objective:   Vitals:   11/04/15 0740 11/04/15 0800  BP: (!) 106/39   Pulse: 67   Resp: (!) 24   Temp:  97.5 F (36.4 C)     Intake/Output from previous day:  08/10 0701 - 08/11 0700 In: 2400 [I.V.:2200; IV Piggyback:200] Out: 1125 [Urine:250; Emesis/NG output:375]  Intake/Output this shift:  Total I/O In: 100 [IV Piggyback:100] Out: -    Physical Exam:   General: Older WM who is alert.    HEENT: Normal. Pupils equal.   He has an NGT .   Lungs: Bilateral rhonchi.   Abdomen: Must less tender than last PM, has BS.  No mass.   Lab Results:    Recent Labs  11/03/15 1430 11/03/15 1438  WBC 6.9  --   HGB 12.8* 13.9  HCT 38.4* 41.0  PLT 216  --     BMET   Recent Labs  11/03/15 1430 11/03/15 1438  NA 138 140  K 4.7 4.6  CL 104 102  CO2 25  --   GLUCOSE 171* 168*  BUN 30* 45*  CREATININE 1.17 1.10  CALCIUM 8.0*  --      PT/INR  No results for input(s): LABPROT, INR in the last 72 hours.  ABG  No results for input(s): PHART, HCO3 in the last 72 hours.  Invalid input(s): PCO2, PO2   Studies/Results:  Ct Abdomen Pelvis W Contrast  Result Date: 11/03/2015 CLINICAL DATA:  Intermittent left lower quadrant pain for several hours, initial encounter EXAM: CT ABDOMEN AND PELVIS WITH CONTRAST TECHNIQUE: Multidetector CT imaging of the abdomen and pelvis was performed using the standard protocol following bolus administration of intravenous contrast. CONTRAST:  34mL ISOVUE-300 IOPAMIDOL (ISOVUE-300) INJECTION 61% COMPARISON:  None. FINDINGS: Lower chest: Calcified pleural plaques are identified bilaterally. Mild bilateral lower lobe atelectatic changes are seen. No sizable effusion is noted. Hepatobiliary: Gallbladder is been surgically removed. The liver is within normal limits. Pancreas: There is 11 mm hypodensity noted in the head of the pancreas best seen on image number 31 of series 2. Although this may represent a pancreatic cyst the possibility of small pancreatic lesion could not be totally excluded. Spleen: Small splenic cyst is noted. Adrenals/Urinary Tract: No masses identified. No evidence of hydronephrosis. Stomach/Bowel: There is small bowel dilatation with some mild inflammatory changes surrounding the  small bowel. This obstructive pattern continues into the right lower quadrant head which point there is an intussusception of the ileum within the ileum. No definitive mass lesion is seen. Small bowel beyond this intussusception is within normal limits. Scattered small bowel diverticula are noted. A large fluid containing diverticulum of the duodenum is seen adjacent to the pancreatic head. Moderate-sized hiatal hernia is noted. Vascular/Lymphatic: No pathologically enlarged lymph nodes. No evidence of abdominal aortic aneurysm. Reproductive: No mass or other significant abnormality. Other: Mild free fluid is noted  within the pelvis. Musculoskeletal: Postsurgical changes in the left hip are noted. Chronic compression deformities at T12 and L2 are noted IMPRESSION: Small-bowel obstruction secondary to an ileal to ileal intussusception in the right lower quadrant. Some inflammatory changes are noted surrounding the dilated loops in the left mid abdomen. No evidence of perforation is identified. Hypodensity within the head of the pancreas which may represent a complicated cyst although the possibility of a neoplasm could not be totally excluded. The need for further evaluation can be determined on a clinical basis. Bibasilar changes left greater than right likely representing some acute on chronic infiltrate/atelectasis. Critical Value/emergent results were called by telephone at the time of interpretation on 11/03/2015 at 3:54 pm to Dr. Jola Schmidt , who verbally acknowledged these results. Electronically Signed   By: Inez Catalina M.D.   On: 11/03/2015 15:56   Dg Chest Port 1 View  Result Date: 11/04/2015 CLINICAL DATA:  Nasogastric tube placement.  Initial encounter. EXAM: PORTABLE CHEST 1 VIEW COMPARISON:  Chest radiograph performed 02/10/2014 FINDINGS: The patient's enteric tube is seen ending overlying the body of the stomach, with the side port about the gastroesophageal junction. This could be advanced several centimeters, as deemed clinically appropriate. The lungs are hypoexpanded. Bibasilar airspace opacities may reflect atelectasis or pneumonia. Small bilateral pleural effusions are suspected. No pneumothorax is seen. The cardiomediastinal silhouette is borderline normal in size. No acute osseous abnormalities are identified. Clips are noted within the right upper quadrant, reflecting prior cholecystectomy. IMPRESSION: 1. Enteric tube noted ending overlying the body of the stomach, with the side port about the gastroesophageal junction. This could be advanced several centimeters, as deemed clinically appropriate.  2. Lungs hypoexpanded. Bibasilar airspace opacities may reflect atelectasis or pneumonia. Small bilateral pleural effusions suspected. Electronically Signed   By: Garald Balding M.D.   On: 11/04/2015 05:56   Dg Abd 2 Views  Result Date: 11/04/2015 CLINICAL DATA:  Check nasogastric catheter placement EXAM: ABDOMEN - 2 VIEW COMPARISON:  11/03/2015 FINDINGS: Scattered large and small bowel gas is noted. No obstructive changes seen. Bladder is well distended with contrast opacified urine. Left hip replacement is noted. A nasogastric catheter is noted within the stomach just beyond the gastroesophageal junction. No free air is seen. Pleural plaques are noted bilaterally. IMPRESSION: Nasogastric catheter within the stomach. The degree of small bowel dilatation has improved from the prior CT examination. Electronically Signed   By: Inez Catalina M.D.   On: 11/04/2015 08:17   Dg Abd Portable 1v  Result Date: 11/03/2015 CLINICAL DATA:  Nasogastric tube placement. EXAM: PORTABLE ABDOMEN - 1 VIEW COMPARISON:  CT 11/03/2015 FINDINGS: Nasogastric tube has been placed, tip overlying the level of the proximal stomach and possibly within the hiatal hernia or proximal stomach. Bowel gas pattern shows persistent small bowel dilatation. Contrast is identified within the urinary bladder following CT exam. IMPRESSION: Nasogastric tube tip likely within the proximal stomach or hiatal hernia. Persistent dilatation of small bowel loops.  Electronically Signed   By: Nolon Nations M.D.   On: 11/03/2015 17:03     Anti-infectives:   Anti-infectives    Start     Dose/Rate Route Frequency Ordered Stop   11/03/15 1930  Ampicillin-Sulbactam (UNASYN) 3 g in sodium chloride 0.9 % 100 mL IVPB     3 g 100 mL/hr over 60 Minutes Intravenous Every 6 hours 11/03/15 1848     11/03/15 1600  cefTRIAXone (ROCEPHIN) 1 g in dextrose 5 % 50 mL IVPB     1 g 100 mL/hr over 30 Minutes Intravenous  Once 11/03/15 1558 11/03/15 1636   11/03/15  1600  azithromycin (ZITHROMAX) 500 mg in dextrose 5 % 250 mL IVPB     500 mg 250 mL/hr over 60 Minutes Intravenous  Once 11/03/15 1558 11/03/15 1858      Alphonsa Overall, MD, FACS Pager: [facility]: [phone] 11/04/2015

## 2015-11-04 NOTE — Progress Notes (Signed)
Initial Nutrition Assessment  DOCUMENTATION CODES:   Not applicable  INTERVENTION:  - Diet advancement as medically feasible.  - RD will continue to monitor for needs with diet advancement versus need for nutrition support.  NUTRITION DIAGNOSIS:     related to inability to eat as evidenced by NPO status.  GOAL:   Patient will meet greater than or equal to 90% of their needs  MONITOR:   Diet advancement, Weight trends, Labs, I & O's  REASON FOR ASSESSMENT:   Malnutrition Screening Tool  ASSESSMENT:   82 y.o. male with medical history significant of cardiomyopathy, lung lobectomy in 1980s, Barretts esophagus, vertebral compression fracture who developed sudden onset abdominal pain and vomited once while at [employer] today around 11 pm. Pt then had one episode of emesis. Currently not in abdominal pain and has had no further vomiting.  Pt found to have SBO and NGT placed to suction.  Pt seen for MST. BMI indicates normal weight. Pt has been NPO since admission and unable to meet estimated nutrition needs. NGT in place to suction with 300cc tan-ish brown drainage present at this time. Dr. Pollie Friar note from ~0830 today states that pt is unable to hear. Pt sleeping at this time and no family/visitors are currently present. Also per Dr. Pollie Friar note, SBO seems to be improving. Will monitor for ability for diet advancement; unlikely for pt to need nutrition support if SBO is improving but will continue to monitor medical course.   Unable to do physical assessment with respect to pt's comfort at this time but did note mild to moderate muscle wasting to temples, clavicles, and shoulders. Will complete physical assessment at follow-up and document findings at that time.   Per chart review, pt has lost 20 lbs (13% body weight) in the past 11 months which is not significant for time frame. He has also lost 5 lbs (3.5% body weight) in the past 5 months which is also not significant for time  frame.   Medications reviewed. Labs reviewed; BUN: 32 mg/dL, creatinine: 1.3 mg/dL, Ca: 7.6 mg/dL, GFR: 46 mL/min, ionized Ca: 0.98 mmol/L. IVF: NS @ 100 mL/hr.    Diet Order:  Diet NPO time specified  Skin:  Reviewed, no issues  Last BM:  PTA  Height:   Ht Readings from Last 1 Encounters:  11/03/15 5\' 5"  (1.651 m)    Weight:   Wt Readings from Last 1 Encounters:  11/03/15 138 lb 7.2 oz (62.8 kg)    Ideal Body Weight:  61.82 kg  BMI:  Body mass index is 23.04 kg/m.  Estimated Nutritional Needs:   Kcal:  1300-1500  Protein:  60-70 grams  Fluid:  >/= 1.5 L/day  EDUCATION NEEDS:   No education needs identified at this time   Jarome Matin, MS, RD, LDN Inpatient Clinical Dietitian Pager # [phone] After hours/weekend pager # [phone]

## 2015-11-05 ENCOUNTER — Inpatient Hospital Stay (HOSPITAL_COMMUNITY): Payer: Medicare Other

## 2015-11-05 ENCOUNTER — Other Ambulatory Visit: Payer: Self-pay

## 2015-11-05 LAB — BASIC METABOLIC PANEL
ANION GAP: 8 (ref 5–15)
BUN: 30 mg/dL — ABNORMAL HIGH (ref 6–20)
CALCIUM: 7.7 mg/dL — AB (ref 8.9–10.3)
CO2: 25 mmol/L (ref 22–32)
Chloride: 111 mmol/L (ref 101–111)
Creatinine, Ser: 1.12 mg/dL (ref 0.61–1.24)
GFR, EST NON AFRICAN AMERICAN: 55 mL/min — AB (ref >60)
Glucose, Bld: 109 mg/dL — ABNORMAL HIGH (ref 65–99)
Potassium: 3.1 mmol/L — ABNORMAL LOW (ref 3.5–5.1)
Sodium: 144 mmol/L (ref 135–145)

## 2015-11-05 LAB — CBC WITH DIFFERENTIAL/PLATELET
BASOS ABS: 0 10*3/uL (ref 0.0–0.1)
BASOS PCT: 0 %
EOS PCT: 0 %
Eosinophils Absolute: 0 10*3/uL (ref 0.0–0.7)
HCT: 34.6 % — ABNORMAL LOW (ref 39.0–52.0)
Hemoglobin: 11.7 g/dL — ABNORMAL LOW (ref 13.0–17.0)
Lymphocytes Relative: 5 %
Lymphs Abs: 0.7 10*3/uL (ref 0.7–4.0)
MCH: 30.5 pg (ref 26.0–34.0)
MCHC: 33.8 g/dL (ref 30.0–36.0)
MCV: 90.1 fL (ref 78.0–100.0)
MONO ABS: 1.2 10*3/uL — AB (ref 0.1–1.0)
Monocytes Relative: 9 %
NEUTROS ABS: 11.8 10*3/uL — AB (ref 1.7–7.7)
Neutrophils Relative %: 86 %
PLATELETS: 205 10*3/uL (ref 150–400)
RBC: 3.84 MIL/uL — ABNORMAL LOW (ref 4.22–5.81)
RDW: 14.9 % (ref 11.5–15.5)
WBC: 13.7 10*3/uL — AB (ref 4.0–10.5)

## 2015-11-05 MED ORDER — METOPROLOL TARTRATE 5 MG/5ML IV SOLN
5.0000 mg | Freq: Four times a day (QID) | INTRAVENOUS | Status: DC
Start: 2015-11-05 — End: 2015-11-09
  Administered 2015-11-05 – 2015-11-09 (×14): 5 mg via INTRAVENOUS
  Filled 2015-11-05 (×13): qty 5

## 2015-11-05 MED ORDER — DIPHENHYDRAMINE HCL 50 MG/ML IJ SOLN
25.0000 mg | Freq: Once | INTRAMUSCULAR | Status: AC
  Administered 2015-11-05: 25 mg via INTRAVENOUS
  Filled 2015-11-05: qty 1

## 2015-11-05 MED ORDER — LIP MEDEX EX OINT
TOPICAL_OINTMENT | CUTANEOUS | Status: AC
  Administered 2015-11-05: 1
  Filled 2015-11-05: qty 7

## 2015-11-05 MED ORDER — POTASSIUM CHLORIDE IN NACL 20-0.45 MEQ/L-% IV SOLN
INTRAVENOUS | Status: DC
  Administered 2015-11-05: 10:00:00 via INTRAVENOUS
  Filled 2015-11-05 (×2): qty 1000

## 2015-11-05 MED ORDER — POTASSIUM CHLORIDE 10 MEQ/100ML IV SOLN
10.0000 meq | INTRAVENOUS | Status: AC
  Administered 2015-11-05 (×3): 10 meq via INTRAVENOUS
  Filled 2015-11-05 (×2): qty 100

## 2015-11-05 MED ORDER — FAMOTIDINE IN NACL 20-0.9 MG/50ML-% IV SOLN
20.0000 mg | INTRAVENOUS | Status: DC
  Administered 2015-11-06 – 2015-11-09 (×4): 20 mg via INTRAVENOUS
  Filled 2015-11-05 (×4): qty 50

## 2015-11-05 NOTE — Progress Notes (Signed)
PROGRESS NOTE    [name]  [id number] DOB: 09/29/1921 DOA: 11/03/2015  PCP: Jenny Reichmann, MD   Brief Narrative:  [name] is a 82 y.o. male with medical history significant of cardiomyopathy, lung lobectomy in 1980s, Barretts esophagus, vertebral compression fracture who developed sudden onset abdominal pain and vomited once while at [employer] around 11 pm. No fever or chills.  Found to have intussusception of ileum. Also found to be hypoxic and thought to have aspirated while vomiting.   Subjective: No complaints this AM- no abdominal pain and no cough.   Assessment & Plan:   Principal Problem:   Small bowel obstruction  - due to intussusception - appears to be resolving- surgery following  Active Problems: Acute hypoxic resp failure - ? Aspiration pneumonia- cont Unasyn- repeat CXR shows bibasilar opacities with hypo expended lungs- ? Atelectasis - cont Unasyn for now- has been weaned off of O2  AKI - due to dehydration/ hypotension? - cont slow hydration and follow I and O- has developed small pleural effusions and therefore do not need to be aggressive with IVF- he had 1 L of urine output yesterday which appropriate - Cr has improved to baseline.   Chronic combined systolic and diastolic CHF (congestive heart failure) - EF 45 %, gr 1 d CHF - compensated- monitor I and O  HTN - Lasix, Lisinopril/ HCTZ and Metoprolol on hold  Gastritis/ GERD? - PPI on hold as shortage of IV Protonix and unable to give oral PPI - Pepcid IV   DVT prophylaxis: Lovenox Code Status: Full code Family Communication: son, Marya Amsler Disposition Plan: to be determined Consultants:   gen surgery Procedures:    Antimicrobials:  Anti-infectives    Start     Dose/Rate Route Frequency Ordered Stop   11/03/15 1930  Ampicillin-Sulbactam (UNASYN) 3 g in sodium chloride 0.9 % 100 mL IVPB     3 g 100 mL/hr over 60 Minutes Intravenous Every 6 hours 11/03/15 1848     11/03/15 1600   cefTRIAXone (ROCEPHIN) 1 g in dextrose 5 % 50 mL IVPB     1 g 100 mL/hr over 30 Minutes Intravenous  Once 11/03/15 1558 11/03/15 1636   11/03/15 1600  azithromycin (ZITHROMAX) 500 mg in dextrose 5 % 250 mL IVPB     500 mg 250 mL/hr over 60 Minutes Intravenous  Once 11/03/15 1558 11/03/15 1858       Objective: Vitals:   11/05/15 0500 11/05/15 0600 11/05/15 0800 11/05/15 1000  BP: (!) 150/79 (!) 142/66 (!) 143/55 (!) 134/58  Pulse: 90 85 92 84  Resp: (!) 22 (!) 26 (!) 22 (!) 23  Temp:   98.6 F (37 C)   TempSrc:   Oral   SpO2: 94% 100% 100% 100%  Weight:      Height:        Intake/Output Summary (Last 24 hours) at 11/05/15 1213 Last data filed at 11/05/15 1010  Gross per 24 hour  Intake          2049.58 ml  Output             1100 ml  Net           949.58 ml   Filed Weights   11/03/15 1930 11/05/15 0400  Weight: 62.8 kg (138 lb 7.2 oz) 65.8 kg (145 lb 1 oz)    Examination: General exam: Appears comfortable  HEENT: PERRLA, oral mucosa moist, no sclera icterus or thrush Respiratory system: Clear  to auscultation. Respiratory effort normal. Pulse ox 92% on 4 L Cardiovascular system: S1 & S2 heard, RRR.  No murmurs  Gastrointestinal system: Abdomen soft, non-tender, nondistended. Audible but decreased bowel sounds. NG tube in place.  No organomegaly Central nervous system: Alert and oriented. No focal neurological deficits. Extremities: No cyanosis, clubbing or edema Skin: No rashes or ulcers Psychiatry:  Mood & affect appropriate.     Data Reviewed: I have personally reviewed following labs and imaging studies  CBC:  Recent Labs Lab 11/03/15 1430 11/03/15 1438 11/04/15 0834 11/05/15 0418  WBC 6.9  --  11.6* 13.7*  NEUTROABS 5.2  --   --  11.8*  HGB 12.8* 13.9 12.6* 11.7*  HCT 38.4* 41.0 37.5* 34.6*  MCV 90.6  --  89.9 90.1  PLT 216  --  213 [id number]   Basic Metabolic Panel:  Recent Labs Lab 11/03/15 1430 11/03/15 1438 11/04/15 0834 11/05/15 0418  NA 138  140 142 144  K 4.7 4.6 3.6 3.1*  CL 104 102 106 111  CO2 25  --  28 25  GLUCOSE 171* 168* 96 109*  BUN 30* 45* 32* 30*  CREATININE 1.17 1.10 1.30* 1.12  CALCIUM 8.0*  --  7.6* 7.7*   GFR: Estimated Creatinine Clearance: 36.6 mL/min (by C-G formula based on SCr of 1.12 mg/dL). Liver Function Tests:  Recent Labs Lab 11/03/15 1430  AST 66*  ALT 42  ALKPHOS 137*  BILITOT 1.2  PROT 6.1*  ALBUMIN 3.2*    Recent Labs Lab 11/03/15 1430  LIPASE 30   No results for input(s): AMMONIA in the last 168 hours. Coagulation Profile: No results for input(s): INR, PROTIME in the last 168 hours. Cardiac Enzymes: No results for input(s): CKTOTAL, CKMB, CKMBINDEX, TROPONINI in the last 168 hours. BNP (last 3 results) No results for input(s): PROBNP in the last 8760 hours. HbA1C: No results for input(s): HGBA1C in the last 72 hours. CBG: No results for input(s): GLUCAP in the last 168 hours. Lipid Profile: No results for input(s): CHOL, HDL, LDLCALC, TRIG, CHOLHDL, LDLDIRECT in the last 72 hours. Thyroid Function Tests: No results for input(s): TSH, T4TOTAL, FREET4, T3FREE, THYROIDAB in the last 72 hours. Anemia Panel: No results for input(s): VITAMINB12, FOLATE, FERRITIN, TIBC, IRON, RETICCTPCT in the last 72 hours. Urine analysis:    Component Value Date/Time   COLORURINE YELLOW 11/03/2015 [place] 11/03/2015 1420   LABSPEC 1.038 (H) 11/03/2015 1420   PHURINE 6.0 11/03/2015 1420   GLUCOSEU NEGATIVE 11/03/2015 1420   HGBUR NEGATIVE 11/03/2015 1420   BILIRUBINUR NEGATIVE 11/03/2015 1420   BILIRUBINUR neg 08/18/2013 0848   KETONESUR NEGATIVE 11/03/2015 1420   PROTEINUR NEGATIVE 11/03/2015 1420   UROBILINOGEN 0.2 11/30/2013 0804   NITRITE NEGATIVE 11/03/2015 1420   LEUKOCYTESUR NEGATIVE 11/03/2015 1420   Sepsis Labs: @LABRCNTIP (procalcitonin:4,lacticidven:4) ) Recent Results (from the past 240 hour(s))  MRSA PCR Screening     Status: None   Collection Time:  11/03/15  9:50 PM  Result Value Ref Range Status   MRSA by PCR NEGATIVE NEGATIVE Final    Comment:        The GeneXpert MRSA Assay (FDA approved for NASAL specimens only), is one component of a comprehensive MRSA colonization surveillance program. It is not intended to diagnose MRSA infection nor to guide or monitor treatment for MRSA infections.          Radiology Studies: Dg Abd 1 View  Result Date: 11/05/2015 CLINICAL DATA:  Nasogastric tube placement.  Initial encounter. EXAM: ABDOMEN - 1 VIEW COMPARISON:  Abdominal radiograph performed 11/04/2015 FINDINGS: The patient's enteric tube is seen ending overlying the body of the stomach. The visualized bowel gas pattern is grossly unremarkable. No free intra-abdominal air is seen, though evaluation for free air is limited on a single supine view. Scattered clips are seen at the right upper quadrant. Right basilar airspace opacity may reflect atelectasis or pneumonia. A small right pleural effusion is suspected. Prominent pleural calcification is noted at the medial right lung base. No acute osseous abnormalities are identified. Mild degenerative change is noted along the lower thoracic and lumbar spine. IMPRESSION: 1. Enteric tube seen ending overlying the body of the stomach. 2. Unremarkable bowel gas pattern; no free intra-abdominal air seen. 3. Right basilar airspace opacity may reflect atelectasis or pneumonia. Suspect small right pleural effusion. Electronically Signed   By: Garald Balding M.D.   On: 11/05/2015 06:47   Ct Abdomen Pelvis W Contrast  Result Date: 11/03/2015 CLINICAL DATA:  Intermittent left lower quadrant pain for several hours, initial encounter EXAM: CT ABDOMEN AND PELVIS WITH CONTRAST TECHNIQUE: Multidetector CT imaging of the abdomen and pelvis was performed using the standard protocol following bolus administration of intravenous contrast. CONTRAST:  27mL ISOVUE-300 IOPAMIDOL (ISOVUE-300) INJECTION 61% COMPARISON:   None. FINDINGS: Lower chest: Calcified pleural plaques are identified bilaterally. Mild bilateral lower lobe atelectatic changes are seen. No sizable effusion is noted. Hepatobiliary: Gallbladder is been surgically removed. The liver is within normal limits. Pancreas: There is 11 mm hypodensity noted in the head of the pancreas best seen on image number 31 of series 2. Although this may represent a pancreatic cyst the possibility of small pancreatic lesion could not be totally excluded. Spleen: Small splenic cyst is noted. Adrenals/Urinary Tract: No masses identified. No evidence of hydronephrosis. Stomach/Bowel: There is small bowel dilatation with some mild inflammatory changes surrounding the small bowel. This obstructive pattern continues into the right lower quadrant head which point there is an intussusception of the ileum within the ileum. No definitive mass lesion is seen. Small bowel beyond this intussusception is within normal limits. Scattered small bowel diverticula are noted. A large fluid containing diverticulum of the duodenum is seen adjacent to the pancreatic head. Moderate-sized hiatal hernia is noted. Vascular/Lymphatic: No pathologically enlarged lymph nodes. No evidence of abdominal aortic aneurysm. Reproductive: No mass or other significant abnormality. Other: Mild free fluid is noted within the pelvis. Musculoskeletal: Postsurgical changes in the left hip are noted. Chronic compression deformities at T12 and L2 are noted IMPRESSION: Small-bowel obstruction secondary to an ileal to ileal intussusception in the right lower quadrant. Some inflammatory changes are noted surrounding the dilated loops in the left mid abdomen. No evidence of perforation is identified. Hypodensity within the head of the pancreas which may represent a complicated cyst although the possibility of a neoplasm could not be totally excluded. The need for further evaluation can be determined on a clinical basis. Bibasilar  changes left greater than right likely representing some acute on chronic infiltrate/atelectasis. Critical Value/emergent results were called by telephone at the time of interpretation on 11/03/2015 at 3:54 pm to Dr. Jola Schmidt , who verbally acknowledged these results. Electronically Signed   By: Inez Catalina M.D.   On: 11/03/2015 15:56   Dg Chest Port 1 View  Result Date: 11/04/2015 CLINICAL DATA:  Nasogastric tube placement.  Initial encounter. EXAM: PORTABLE CHEST 1 VIEW COMPARISON:  Chest radiograph performed 02/10/2014 FINDINGS: The patient's enteric tube is seen ending  overlying the body of the stomach, with the side port about the gastroesophageal junction. This could be advanced several centimeters, as deemed clinically appropriate. The lungs are hypoexpanded. Bibasilar airspace opacities may reflect atelectasis or pneumonia. Small bilateral pleural effusions are suspected. No pneumothorax is seen. The cardiomediastinal silhouette is borderline normal in size. No acute osseous abnormalities are identified. Clips are noted within the right upper quadrant, reflecting prior cholecystectomy. IMPRESSION: 1. Enteric tube noted ending overlying the body of the stomach, with the side port about the gastroesophageal junction. This could be advanced several centimeters, as deemed clinically appropriate. 2. Lungs hypoexpanded. Bibasilar airspace opacities may reflect atelectasis or pneumonia. Small bilateral pleural effusions suspected. Electronically Signed   By: Garald Balding M.D.   On: 11/04/2015 05:56   Dg Abd 2 Views  Result Date: 11/04/2015 CLINICAL DATA:  Check nasogastric catheter placement EXAM: ABDOMEN - 2 VIEW COMPARISON:  11/03/2015 FINDINGS: Scattered large and small bowel gas is noted. No obstructive changes seen. Bladder is well distended with contrast opacified urine. Left hip replacement is noted. A nasogastric catheter is noted within the stomach just beyond the gastroesophageal junction.  No free air is seen. Pleural plaques are noted bilaterally. IMPRESSION: Nasogastric catheter within the stomach. The degree of small bowel dilatation has improved from the prior CT examination. Electronically Signed   By: Inez Catalina M.D.   On: 11/04/2015 08:17   Dg Abd Portable 1v  Result Date: 11/03/2015 CLINICAL DATA:  Nasogastric tube placement. EXAM: PORTABLE ABDOMEN - 1 VIEW COMPARISON:  CT 11/03/2015 FINDINGS: Nasogastric tube has been placed, tip overlying the level of the proximal stomach and possibly within the hiatal hernia or proximal stomach. Bowel gas pattern shows persistent small bowel dilatation. Contrast is identified within the urinary bladder following CT exam. IMPRESSION: Nasogastric tube tip likely within the proximal stomach or hiatal hernia. Persistent dilatation of small bowel loops. Electronically Signed   By: Nolon Nations M.D.   On: 11/03/2015 17:03      Scheduled Meds: . ampicillin-sulbactam (UNASYN) IV  3 g Intravenous Q6H  . enoxaparin (LOVENOX) injection  40 mg Subcutaneous Q24H  . [START ON 11/06/2015] famotidine (PEPCID) IV  20 mg Intravenous Q24H  . potassium chloride  10 mEq Intravenous Q1 Hr x 3  . sodium chloride  1,000 mL Intravenous Once   Continuous Infusions: . 0.45 % NaCl with KCl 20 mEq / L 75 mL/hr at 11/05/15 1003     LOS: 2 days    Time spent in minutes: [place], MD [facility] Pager: [email or web address] Password [facility] 11/05/2015, 12:13 PM

## 2015-11-05 NOTE — Progress Notes (Addendum)
[facility] Office:  [phone] General Surgery Progress Note   LOS: 2 days  POD -     Assessment/Plan: 1.  Intussusception with SBO  KUB unremarkable, but distended.  No flatus or BM.  Continue NGT Son, [name], at bedside.  I spent a fair amount of time talking to him about the plan.  He's more confused today.  2.  Aspiration pneumonia  Better sats today  WBC - 13,700 - 11/05/2015  Unasyn - 8/10 >>>   3.  Cardiomyopathy - Chronoic systoiolc CHF  Followed by Dr. Dorris Carnes 4.  Vertebral compression fx - L2 5.  History of Barrett's esophagus  Has been seen by Dr. Hilarie Fredrickson. 6.  DVT prophylaxis - Lovenox 7.  Hypokalemia   K+ 3.1 - 11/05/2015  To add K+ to IVF and give runs of K+   Principal Problem:   Small bowel obstruction (HCC) Active Problems:   Essential hypertension   Chronic combined systolic and diastolic CHF (congestive heart failure) (HCC)   Compression fracture of L2 lumbar vertebra (HCC)   Intussusception (HCC)  Subjective:  Confused, but alert.  Picking at things.  Apparently removed the NGT last PM.  Deland Pretty at bedside.  Objective:   Vitals:   11/05/15 0600 11/05/15 0800  BP: (!) 142/66   Pulse: 85   Resp: (!) 26   Temp:  98.6 F (37 C)     Intake/Output from previous day:  08/11 0701 - 08/12 0700 In: 2403.3 [I.V.:1953.3; IV Piggyback:450] Out: 1250 [Urine:1000; Emesis/NG output:250]  Intake/Output this shift:  No intake/output data recorded.   Physical Exam:   General: Older WM who is alert.    HEENT: Normal. Pupils equal.   He has an NGT .   Lungs: Bilateral rhonchi.   Abdomen: More distended, but no localized tenderness.  BS present.   Lab Results:     Recent Labs  11/04/15 0834 11/05/15 0418  WBC 11.6* 13.7*  HGB 12.6* 11.7*  HCT 37.5* 34.6*  PLT 213 205    BMET    Recent Labs  11/04/15 0834 11/05/15 0418  NA 142 144  K 3.6 3.1*  CL 106 111  CO2 28 25  GLUCOSE 96 109*  BUN 32* 30*  CREATININE 1.30*  1.12  CALCIUM 7.6* 7.7*    PT/INR  No results for input(s): LABPROT, INR in the last 72 hours.  ABG  No results for input(s): PHART, HCO3 in the last 72 hours.  Invalid input(s): PCO2, PO2   Studies/Results:  Dg Abd 1 View  Result Date: 11/05/2015 CLINICAL DATA:  Nasogastric tube placement.  Initial encounter. EXAM: ABDOMEN - 1 VIEW COMPARISON:  Abdominal radiograph performed 11/04/2015 FINDINGS: The patient's enteric tube is seen ending overlying the body of the stomach. The visualized bowel gas pattern is grossly unremarkable. No free intra-abdominal air is seen, though evaluation for free air is limited on a single supine view. Scattered clips are seen at the right upper quadrant. Right basilar airspace opacity may reflect atelectasis or pneumonia. A small right pleural effusion is suspected. Prominent pleural calcification is noted at the medial right lung base. No acute osseous abnormalities are identified. Mild degenerative change is noted along the lower thoracic and lumbar spine. IMPRESSION: 1. Enteric tube seen ending overlying the body of the stomach. 2. Unremarkable bowel gas pattern; no free intra-abdominal air seen. 3. Right basilar airspace opacity may reflect atelectasis or pneumonia. Suspect small right pleural effusion. Electronically Signed   By: Garald Balding  M.D.   On: 11/05/2015 06:47   Ct Abdomen Pelvis W Contrast  Result Date: 11/03/2015 CLINICAL DATA:  Intermittent left lower quadrant pain for several hours, initial encounter EXAM: CT ABDOMEN AND PELVIS WITH CONTRAST TECHNIQUE: Multidetector CT imaging of the abdomen and pelvis was performed using the standard protocol following bolus administration of intravenous contrast. CONTRAST:  80mL ISOVUE-300 IOPAMIDOL (ISOVUE-300) INJECTION 61% COMPARISON:  None. FINDINGS: Lower chest: Calcified pleural plaques are identified bilaterally. Mild bilateral lower lobe atelectatic changes are seen. No sizable effusion is noted.  Hepatobiliary: Gallbladder is been surgically removed. The liver is within normal limits. Pancreas: There is 11 mm hypodensity noted in the head of the pancreas best seen on image number 31 of series 2. Although this may represent a pancreatic cyst the possibility of small pancreatic lesion could not be totally excluded. Spleen: Small splenic cyst is noted. Adrenals/Urinary Tract: No masses identified. No evidence of hydronephrosis. Stomach/Bowel: There is small bowel dilatation with some mild inflammatory changes surrounding the small bowel. This obstructive pattern continues into the right lower quadrant head which point there is an intussusception of the ileum within the ileum. No definitive mass lesion is seen. Small bowel beyond this intussusception is within normal limits. Scattered small bowel diverticula are noted. A large fluid containing diverticulum of the duodenum is seen adjacent to the pancreatic head. Moderate-sized hiatal hernia is noted. Vascular/Lymphatic: No pathologically enlarged lymph nodes. No evidence of abdominal aortic aneurysm. Reproductive: No mass or other significant abnormality. Other: Mild free fluid is noted within the pelvis. Musculoskeletal: Postsurgical changes in the left hip are noted. Chronic compression deformities at T12 and L2 are noted IMPRESSION: Small-bowel obstruction secondary to an ileal to ileal intussusception in the right lower quadrant. Some inflammatory changes are noted surrounding the dilated loops in the left mid abdomen. No evidence of perforation is identified. Hypodensity within the head of the pancreas which may represent a complicated cyst although the possibility of a neoplasm could not be totally excluded. The need for further evaluation can be determined on a clinical basis. Bibasilar changes left greater than right likely representing some acute on chronic infiltrate/atelectasis. Critical Value/emergent results were called by telephone at the time of  interpretation on 11/03/2015 at 3:54 pm to Dr. Jola Schmidt , who verbally acknowledged these results. Electronically Signed   By: Inez Catalina M.D.   On: 11/03/2015 15:56   Dg Chest Port 1 View  Result Date: 11/04/2015 CLINICAL DATA:  Nasogastric tube placement.  Initial encounter. EXAM: PORTABLE CHEST 1 VIEW COMPARISON:  Chest radiograph performed 02/10/2014 FINDINGS: The patient's enteric tube is seen ending overlying the body of the stomach, with the side port about the gastroesophageal junction. This could be advanced several centimeters, as deemed clinically appropriate. The lungs are hypoexpanded. Bibasilar airspace opacities may reflect atelectasis or pneumonia. Small bilateral pleural effusions are suspected. No pneumothorax is seen. The cardiomediastinal silhouette is borderline normal in size. No acute osseous abnormalities are identified. Clips are noted within the right upper quadrant, reflecting prior cholecystectomy. IMPRESSION: 1. Enteric tube noted ending overlying the body of the stomach, with the side port about the gastroesophageal junction. This could be advanced several centimeters, as deemed clinically appropriate. 2. Lungs hypoexpanded. Bibasilar airspace opacities may reflect atelectasis or pneumonia. Small bilateral pleural effusions suspected. Electronically Signed   By: Garald Balding M.D.   On: 11/04/2015 05:56   Dg Abd 2 Views  Result Date: 11/04/2015 CLINICAL DATA:  Check nasogastric catheter placement EXAM: ABDOMEN - 2  VIEW COMPARISON:  11/03/2015 FINDINGS: Scattered large and small bowel gas is noted. No obstructive changes seen. Bladder is well distended with contrast opacified urine. Left hip replacement is noted. A nasogastric catheter is noted within the stomach just beyond the gastroesophageal junction. No free air is seen. Pleural plaques are noted bilaterally. IMPRESSION: Nasogastric catheter within the stomach. The degree of small bowel dilatation has improved from the  prior CT examination. Electronically Signed   By: Inez Catalina M.D.   On: 11/04/2015 08:17   Dg Abd Portable 1v  Result Date: 11/03/2015 CLINICAL DATA:  Nasogastric tube placement. EXAM: PORTABLE ABDOMEN - 1 VIEW COMPARISON:  CT 11/03/2015 FINDINGS: Nasogastric tube has been placed, tip overlying the level of the proximal stomach and possibly within the hiatal hernia or proximal stomach. Bowel gas pattern shows persistent small bowel dilatation. Contrast is identified within the urinary bladder following CT exam. IMPRESSION: Nasogastric tube tip likely within the proximal stomach or hiatal hernia. Persistent dilatation of small bowel loops. Electronically Signed   By: Nolon Nations M.D.   On: 11/03/2015 17:03     Anti-infectives:   Anti-infectives    Start     Dose/Rate Route Frequency Ordered Stop   11/03/15 1930  Ampicillin-Sulbactam (UNASYN) 3 g in sodium chloride 0.9 % 100 mL IVPB     3 g 100 mL/hr over 60 Minutes Intravenous Every 6 hours 11/03/15 1848     11/03/15 1600  cefTRIAXone (ROCEPHIN) 1 g in dextrose 5 % 50 mL IVPB     1 g 100 mL/hr over 30 Minutes Intravenous  Once 11/03/15 1558 11/03/15 1636   11/03/15 1600  azithromycin (ZITHROMAX) 500 mg in dextrose 5 % 250 mL IVPB     500 mg 250 mL/hr over 60 Minutes Intravenous  Once 11/03/15 1558 11/03/15 1858      Alphonsa Overall, MD, FACS Pager: [facility] Office: [phone] 11/05/2015

## 2015-11-05 NOTE — Progress Notes (Signed)
Patient agitated attempting to get out of bed sitter at bedside. Monitor showed HR 130's irregular rhythm. EKG done. Dr. Wynelle Cleveland notified new orders received

## 2015-11-06 ENCOUNTER — Inpatient Hospital Stay (HOSPITAL_COMMUNITY): Payer: Medicare Other

## 2015-11-06 ENCOUNTER — Other Ambulatory Visit (HOSPITAL_COMMUNITY): Payer: Medicare Other

## 2015-11-06 ENCOUNTER — Encounter (HOSPITAL_COMMUNITY): Payer: Self-pay | Admitting: Radiology

## 2015-11-06 LAB — BASIC METABOLIC PANEL
ANION GAP: 11 (ref 5–15)
BUN: 31 mg/dL — ABNORMAL HIGH (ref 6–20)
CALCIUM: 8.3 mg/dL — AB (ref 8.9–10.3)
CHLORIDE: 113 mmol/L — AB (ref 101–111)
CO2: 22 mmol/L (ref 22–32)
CREATININE: 1.36 mg/dL — AB (ref 0.61–1.24)
GFR calc non Af Amer: 44 mL/min — ABNORMAL LOW (ref >60)
GFR, EST AFRICAN AMERICAN: 50 mL/min — AB (ref >60)
Glucose, Bld: 104 mg/dL — ABNORMAL HIGH (ref 65–99)
Potassium: 4 mmol/L (ref 3.5–5.1)
SODIUM: 146 mmol/L — AB (ref 135–145)

## 2015-11-06 LAB — CBC WITH DIFFERENTIAL/PLATELET
BASOS PCT: 0 %
Basophils Absolute: 0 10*3/uL (ref 0.0–0.1)
EOS ABS: 0 10*3/uL (ref 0.0–0.7)
Eosinophils Relative: 0 %
HCT: 37 % — ABNORMAL LOW (ref 39.0–52.0)
HEMOGLOBIN: 12.3 g/dL — AB (ref 13.0–17.0)
Lymphocytes Relative: 4 %
Lymphs Abs: 0.6 10*3/uL — ABNORMAL LOW (ref 0.7–4.0)
MCH: 30.4 pg (ref 26.0–34.0)
MCHC: 33.2 g/dL (ref 30.0–36.0)
MCV: 91.4 fL (ref 78.0–100.0)
Monocytes Absolute: 1.1 10*3/uL — ABNORMAL HIGH (ref 0.1–1.0)
Monocytes Relative: 7 %
NEUTROS PCT: 89 %
Neutro Abs: 14.4 10*3/uL — ABNORMAL HIGH (ref 1.7–7.7)
PLATELETS: 229 10*3/uL (ref 150–400)
RBC: 4.05 MIL/uL — AB (ref 4.22–5.81)
RDW: 15 % (ref 11.5–15.5)
WBC: 16.1 10*3/uL — AB (ref 4.0–10.5)

## 2015-11-06 MED ORDER — ENOXAPARIN SODIUM 30 MG/0.3ML ~~LOC~~ SOLN
30.0000 mg | SUBCUTANEOUS | Status: DC
  Administered 2015-11-06: 30 mg via SUBCUTANEOUS
  Filled 2015-11-06: qty 0.3

## 2015-11-06 MED ORDER — DEXTROSE 5 % IV SOLN
INTRAVENOUS | Status: AC
  Administered 2015-11-06 – 2015-11-07 (×2): via INTRAVENOUS

## 2015-11-06 MED ORDER — SODIUM CHLORIDE 0.9 % IV SOLN
3.0000 g | Freq: Two times a day (BID) | INTRAVENOUS | Status: DC
  Administered 2015-11-06 – 2015-11-07 (×2): 3 g via INTRAVENOUS
  Filled 2015-11-06 (×2): qty 3

## 2015-11-06 MED ORDER — DIATRIZOATE MEGLUMINE & SODIUM 66-10 % PO SOLN
30.0000 mL | Freq: Once | ORAL | Status: AC
Start: 2015-11-06 — End: 2015-11-06
  Administered 2015-11-06: 30 mL via ORAL
  Filled 2015-11-06: qty 30

## 2015-11-06 NOTE — Progress Notes (Signed)
[facility]:  [phone] General Surgery Progress Note   LOS: 3 days  POD -     Assessment/Plan: 1.  Intussusception with SBO  KUB unremarkable, but distended.  No flatus or BM.  Continue NGT  Still confused, in gloves.  No abdominal pain.  Difficult to assess.  Now on 3rd day of NGT.  Will repeat abdominal CT scan with oral contrast only.  2.  Aspiration pneumonia  WBC - 16,100 - 11/06/2015  Unasyn - 8/10 >>>  Assume increasing WBC due to pulmonary disease.   3.  Cardiomyopathy - Chronoic systoiolc CHF  Followed by Dr. Bretta Bang into A fib briefly last PM 4.  Vertebral compression fx - L2 5.  History of Barrett's esophagus  Has been seen by Dr. Hilarie Fredrickson. 6.  DVT prophylaxis - Lovenox 7.  Hypokalemia - resolved  K+ 4.0 - 11/06/2015 8.  Renal function - Creat - 1.36 - 11/06/2015 9.  DVT prophylaxis - Lovenox   Principal Problem:   Small bowel obstruction (HCC) Active Problems:   Essential hypertension   Chronic combined systolic and diastolic CHF (congestive heart failure) (HCC)   Compression fracture of L2 lumbar vertebra (HCC)   Intussusception (HCC)  Subjective:  Confused, but alert.  In protective gloves.  Has no pain on palpation of abdomen.  No family in room.  Objective:   Vitals:   11/06/15 0000 11/06/15 0400  BP:    Pulse:    Resp:    Temp: 97.9 F (36.6 C) 98.2 F (36.8 C)     Intake/Output from previous day:  08/12 0701 - 08/13 0700 In: 1605 [I.V.:1155; IV Piggyback:450] Out: 1100 [Urine:800; Emesis/NG output:300]  Intake/Output this shift:  No intake/output data recorded.   Physical Exam:   General: Older WM who is alert, but confused.  Responds to questions.   HEENT: Normal. Pupils equal.   He has an NGT .   Lungs: Bilateral rhonchi.   Abdomen: Less distended than yesterday, no localized tenderness.  BS present.   Rectal:  Stool in rectal vault.  Lab Results:     Recent Labs  11/05/15 0418 11/06/15 0323   WBC 13.7* 16.1*  HGB 11.7* 12.3*  HCT 34.6* 37.0*  PLT 205 229    BMET    Recent Labs  11/05/15 0418 11/06/15 0323  NA 144 146*  K 3.1* 4.0  CL 111 113*  CO2 25 22  GLUCOSE 109* 104*  BUN 30* 31*  CREATININE 1.12 1.36*  CALCIUM 7.7* 8.3*    PT/INR  No results for input(s): LABPROT, INR in the last 72 hours.  ABG  No results for input(s): PHART, HCO3 in the last 72 hours.  Invalid input(s): PCO2, PO2   Studies/Results:  Dg Abd 1 View  Result Date: 11/05/2015 CLINICAL DATA:  Nasogastric tube placement.  Initial encounter. EXAM: ABDOMEN - 1 VIEW COMPARISON:  Abdominal radiograph performed 11/04/2015 FINDINGS: The patient's enteric tube is seen ending overlying the body of the stomach. The visualized bowel gas pattern is grossly unremarkable. No free intra-abdominal air is seen, though evaluation for free air is limited on a single supine view. Scattered clips are seen at the right upper quadrant. Right basilar airspace opacity may reflect atelectasis or pneumonia. A small right pleural effusion is suspected. Prominent pleural calcification is noted at the medial right lung base. No acute osseous abnormalities are identified. Mild degenerative change is noted along the lower thoracic and lumbar spine. IMPRESSION: 1. Enteric tube seen  ending overlying the body of the stomach. 2. Unremarkable bowel gas pattern; no free intra-abdominal air seen. 3. Right basilar airspace opacity may reflect atelectasis or pneumonia. Suspect small right pleural effusion. Electronically Signed   By: Garald Balding M.D.   On: 11/05/2015 06:47   Dg Abd 2 Views  Result Date: 11/04/2015 CLINICAL DATA:  Check nasogastric catheter placement EXAM: ABDOMEN - 2 VIEW COMPARISON:  11/03/2015 FINDINGS: Scattered large and small bowel gas is noted. No obstructive changes seen. Bladder is well distended with contrast opacified urine. Left hip replacement is noted. A nasogastric catheter is noted within the stomach just  beyond the gastroesophageal junction. No free air is seen. Pleural plaques are noted bilaterally. IMPRESSION: Nasogastric catheter within the stomach. The degree of small bowel dilatation has improved from the prior CT examination. Electronically Signed   By: Inez Catalina M.D.   On: 11/04/2015 08:17     Anti-infectives:   Anti-infectives    Start     Dose/Rate Route Frequency Ordered Stop   11/03/15 1930  Ampicillin-Sulbactam (UNASYN) 3 g in sodium chloride 0.9 % 100 mL IVPB     3 g 100 mL/hr over 60 Minutes Intravenous Every 6 hours 11/03/15 1848     11/03/15 1600  cefTRIAXone (ROCEPHIN) 1 g in dextrose 5 % 50 mL IVPB     1 g 100 mL/hr over 30 Minutes Intravenous  Once 11/03/15 1558 11/03/15 1636   11/03/15 1600  azithromycin (ZITHROMAX) 500 mg in dextrose 5 % 250 mL IVPB     500 mg 250 mL/hr over 60 Minutes Intravenous  Once 11/03/15 1558 11/03/15 1858      Alphonsa Overall, MD, FACS Pager: [facility] Office: [phone] 11/06/2015

## 2015-11-06 NOTE — Progress Notes (Signed)
PROGRESS NOTE    [name]  [id number] DOB: [birth year] DOA: 11/03/2015  PCP: Jenny Reichmann, MD   Brief Narrative:  [name] is a 82 y.o. male with medical history significant of cardiomyopathy, lung lobectomy in 1980s, Barretts esophagus, vertebral compression fracture who developed sudden onset abdominal pain and vomited once while at [employer] around 11 pm. No fever or chills.  Found to have intussusception of ileum. Also found to be hypoxic and thought to have aspirated while vomiting.   Subjective: No complaints    Assessment & Plan:   Principal Problem:   Small bowel obstruction  - due to intussusception - appears to be resolving- no much NG tube output- surgery following  Active Problems: Acute hypoxic resp failure - ? Aspiration pneumonia- cont Unasyn- repeat CXR shows bibasilar opacities with hypo expended lungs- ? Atelectasis - cont Unasyn for now- has been weaned off of O2  A-fib with RVR - no prior history of this - given IV Metoprolol- now back in sinus rhythm - ECHO ordered- check thyroid studies  AKI- pleural effusions  - due to dehydration/ hypotension? - cont slow hydration and follow I and O- has developed small pleural effusions and therefore do not need to be aggressive with IVF-  Making a good amount of urine and is not dehydrated - Cr has improved to baseline.   Chronic combined systolic and diastolic CHF (congestive heart failure) - EF 45 %, gr 1 d CHF - compensated- monitor I and O  Hypernatremia - change IVF to D5W  HTN - Lasix, Lisinopril/ HCTZ and Metoprolol on hold - IV Lopressor for now  Gastritis/ GERD? - PPI on hold as shortage of IV Protonix and unable to give oral PPI - Pepcid IV   DVT prophylaxis: Lovenox Code Status: Full code Family Communication: son, [name] Disposition Plan: to be determined Consultants:   gen surgery Procedures:    Antimicrobials:  Anti-infectives    Start     Dose/Rate Route Frequency  Ordered Stop   11/06/15 2200  Ampicillin-Sulbactam (UNASYN) 3 g in sodium chloride 0.9 % 100 mL IVPB     3 g 100 mL/hr over 60 Minutes Intravenous Every 12 hours 11/06/15 1020     11/03/15 1930  Ampicillin-Sulbactam (UNASYN) 3 g in sodium chloride 0.9 % 100 mL IVPB  Status:  Discontinued     3 g 100 mL/hr over 60 Minutes Intravenous Every 6 hours 11/03/15 1848 11/06/15 1020   11/03/15 1600  cefTRIAXone (ROCEPHIN) 1 g in dextrose 5 % 50 mL IVPB     1 g 100 mL/hr over 30 Minutes Intravenous  Once 11/03/15 1558 11/03/15 1636   11/03/15 1600  azithromycin (ZITHROMAX) 500 mg in dextrose 5 % 250 mL IVPB     500 mg 250 mL/hr over 60 Minutes Intravenous  Once 11/03/15 1558 11/03/15 1858       Objective: Vitals:   11/06/15 0000 11/06/15 0400 11/06/15 0500 11/06/15 0801  BP:      Pulse:      Resp:      Temp: 97.9 F (36.6 C) 98.2 F (36.8 C)  97.6 F (36.4 C)  TempSrc: Oral Oral  Axillary  SpO2:      Weight:   64.8 kg (142 lb 13.7 oz)   Height:        Intake/Output Summary (Last 24 hours) at 11/06/15 1145 Last data filed at 11/06/15 0908  Gross per 24 hour  Intake  1600 ml  Output              600 ml  Net             1000 ml   Filed Weights   11/03/15 1930 11/05/15 0400 11/06/15 0500  Weight: 62.8 kg (138 lb 7.2 oz) 65.8 kg (145 lb 1 oz) 64.8 kg (142 lb 13.7 oz)    Examination: General exam: Appears comfortable  HEENT: PERRLA, oral mucosa moist, no sclera icterus or thrush Respiratory system: Clear to auscultation. Respiratory effort normal. Pulse ox 96 % on room air  Cardiovascular system: S1 & S2 heard, RRR.  No murmurs  Gastrointestinal system: Abdomen soft, non-tender, nondistended. Audible but decreased bowel sounds. NG tube in place.  No organomegaly Central nervous system: Alert and oriented. No focal neurological deficits. Extremities: No cyanosis, clubbing or edema Skin: No rashes or ulcers Psychiatry:  Mood & affect appropriate.     Data Reviewed:  I have personally reviewed following labs and imaging studies  CBC:  Recent Labs Lab 11/03/15 1430 11/03/15 1438 11/04/15 0834 11/05/15 0418 11/06/15 0323  WBC 6.9  --  11.6* 13.7* 16.1*  NEUTROABS 5.2  --   --  11.8* 14.4*  HGB 12.8* 13.9 12.6* 11.7* 12.3*  HCT 38.4* 41.0 37.5* 34.6* 37.0*  MCV 90.6  --  89.9 90.1 91.4  PLT 216  --  213 205 [id number]   Basic Metabolic Panel:  Recent Labs Lab 11/03/15 1430 11/03/15 1438 11/04/15 0834 11/05/15 0418 11/06/15 0323  NA 138 140 142 144 146*  K 4.7 4.6 3.6 3.1* 4.0  CL 104 102 106 111 113*  CO2 25  --  28 25 22   GLUCOSE 171* 168* 96 109* 104*  BUN 30* 45* 32* 30* 31*  CREATININE 1.17 1.10 1.30* 1.12 1.36*  CALCIUM 8.0*  --  7.6* 7.7* 8.3*   GFR: Estimated Creatinine Clearance: 30.1 mL/min (by C-G formula based on SCr of 1.36 mg/dL). Liver Function Tests:  Recent Labs Lab 11/03/15 1430  AST 66*  ALT 42  ALKPHOS 137*  BILITOT 1.2  PROT 6.1*  ALBUMIN 3.2*    Recent Labs Lab 11/03/15 1430  LIPASE 30   No results for input(s): AMMONIA in the last 168 hours. Coagulation Profile: No results for input(s): INR, PROTIME in the last 168 hours. Cardiac Enzymes: No results for input(s): CKTOTAL, CKMB, CKMBINDEX, TROPONINI in the last 168 hours. BNP (last 3 results) No results for input(s): PROBNP in the last 8760 hours. HbA1C: No results for input(s): HGBA1C in the last 72 hours. CBG: No results for input(s): GLUCAP in the last 168 hours. Lipid Profile: No results for input(s): CHOL, HDL, LDLCALC, TRIG, CHOLHDL, LDLDIRECT in the last 72 hours. Thyroid Function Tests: No results for input(s): TSH, T4TOTAL, FREET4, T3FREE, THYROIDAB in the last 72 hours. Anemia Panel: No results for input(s): VITAMINB12, FOLATE, FERRITIN, TIBC, IRON, RETICCTPCT in the last 72 hours. Urine analysis:    Component Value Date/Time   COLORURINE YELLOW 11/03/2015 [place] 11/03/2015 1420   LABSPEC 1.038 (H) 11/03/2015  1420   PHURINE 6.0 11/03/2015 1420   GLUCOSEU NEGATIVE 11/03/2015 1420   HGBUR NEGATIVE 11/03/2015 1420   BILIRUBINUR NEGATIVE 11/03/2015 1420   BILIRUBINUR neg 08/18/2013 0848   KETONESUR NEGATIVE 11/03/2015 1420   PROTEINUR NEGATIVE 11/03/2015 1420   UROBILINOGEN 0.2 11/30/2013 0804   NITRITE NEGATIVE 11/03/2015 1420   LEUKOCYTESUR NEGATIVE 11/03/2015 1420   Sepsis Labs: @LABRCNTIP (procalcitonin:4,lacticidven:4) ) Recent Results (from  the past 240 hour(s))  MRSA PCR Screening     Status: None   Collection Time: 11/03/15  9:50 PM  Result Value Ref Range Status   MRSA by PCR NEGATIVE NEGATIVE Final    Comment:        The GeneXpert MRSA Assay (FDA approved for NASAL specimens only), is one component of a comprehensive MRSA colonization surveillance program. It is not intended to diagnose MRSA infection nor to guide or monitor treatment for MRSA infections.          Radiology Studies: Ct Abdomen Pelvis Wo Contrast  Result Date: 11/06/2015 CLINICAL DATA:  Intussusception with SBO KUB unremarkable, but distended. No flatus or BM. Continue NGT Still confused, in gloves. No abdominal pain. Difficult to assess. Now on 3rd day of NGT. EXAM: CT ABDOMEN AND PELVIS WITHOUT CONTRAST TECHNIQUE: Multidetector CT imaging of the abdomen and pelvis was performed following the standard protocol without IV contrast. COMPARISON:  11/03/2015 FINDINGS: Lung bases: There is dependent lung base opacity associated with densely calcified pleural plaques and minimal pleural effusions. Lung base opacity most consistent with atelectasis. Heart is mildly enlarged. No convincing pulmonary edema. Hepatobiliary: Liver is unremarkable. Gallbladder surgically absent. No bile duct dilation. Spleen: 7 mm low-density lesion posteriorly. This is consistent with a cyst and is stable. There calcified splenic granuloma, also stable. Spleen normal in size. Pancreas: Unremarkable. Adrenal glands:  No masses. Kidneys,  ureters, bladder: Bilateral renal cortical thinning. Small hyper attenuating lesion from the lateral lower pole the left kidney measuring 7 mm, likely a cyst complicated by previous hemorrhage or proteinaceous contents. It is stable. No other renal masses, no stones and no hydronephrosis. Normal ureters. Bladder is unremarkable. Vascular: Diffuse ectasia of the abdominal aorta with atherosclerotic calcifications. Infrarenal aorta measures 3 cm greatest diameter. Loss Lymph nodes:  No adenopathy. Ascites: Small amount ascites noted adjacent to liver, along the pericolic gutters and in the posterior pelvic recess. Gastrointestinal: There are 2 contiguous densities in the distal ileum, 1 measuring 19 mm and the other 2.3 cm in greatest dimension. The current exam, this does not appear to be an intussusception but dense material within the small bowel. Bowel distal this is decompressed while bowel proximal to this is mildly distended. The colon is mostly decompressed. There is no bowel wall thickening. Normal appendix is visualized. Musculoskeletal: Well-positioned left hip total arthroplasty. Bones diffusely demineralized. There is a marked compression fracture of L2, which appears chronic. Mild compression fractures of T12 and T8. Bones are diffusely demineralized. IMPRESSION: 1. Partial small bowel obstruction similar to the prior CT. What was felt be an ileal intussusception on the prior study now appears more consistent with 2 adjacent densities, consistent with stones or dense stool, in the distal ileum. These may or may not be the cause of the obstruction. Bowel distal to these densities is decompressed. There is currently no convincing intussusception. 2. Small amount ascites, increased when compared the prior CT. 3. Minimal pleural effusions and dependent lung base atelectasis, increased from the prior study. Densely calcified pleural plaque at the lung bases is stable. 4. Aortic atherosclerosis and aortic  ectasia, of the infrarenal abdominal aorta measuring 3 cm. Recommend followup by ultrasound in 3 years. This recommendation follows ACR consensus guidelines: White Paper of the ACR Incidental Findings Committee II on Vascular Findings. Natasha [facility] Coll Radiol 2013; 10:789-794 Electronically Signed   By: Lajean Manes M.D.   On: 11/06/2015 11:25   Dg Abd 1 View  Result Date: 11/06/2015 CLINICAL  DATA:  Followup for intussusception. EXAM: ABDOMEN - 1 VIEW COMPARISON:  11/05/2015 FINDINGS: Nasogastric tube passes below the diaphragm into the proximal stomach. There is mild small bowel dilation central abdomen. Air is seen within a normal caliber colon. There is some rectal air. Small bowel distention appears increased when compared to the previous day's study. IMPRESSION: 1. Mild small bowel dilation increased when compared to the previous day's study. 2. Nasogastric tube is well positioned. Electronically Signed   By: Lajean Manes M.D.   On: 11/06/2015 08:55   Dg Abd 1 View  Result Date: 11/05/2015 CLINICAL DATA:  Nasogastric tube placement.  Initial encounter. EXAM: ABDOMEN - 1 VIEW COMPARISON:  Abdominal radiograph performed 11/04/2015 FINDINGS: The patient's enteric tube is seen ending overlying the body of the stomach. The visualized bowel gas pattern is grossly unremarkable. No free intra-abdominal air is seen, though evaluation for free air is limited on a single supine view. Scattered clips are seen at the right upper quadrant. Right basilar airspace opacity may reflect atelectasis or pneumonia. A small right pleural effusion is suspected. Prominent pleural calcification is noted at the medial right lung base. No acute osseous abnormalities are identified. Mild degenerative change is noted along the lower thoracic and lumbar spine. IMPRESSION: 1. Enteric tube seen ending overlying the body of the stomach. 2. Unremarkable bowel gas pattern; no free intra-abdominal air seen. 3. Right basilar airspace opacity may  reflect atelectasis or pneumonia. Suspect small right pleural effusion. Electronically Signed   By: Garald Balding M.D.   On: 11/05/2015 06:47      Scheduled Meds: . ampicillin-sulbactam (UNASYN) IV  3 g Intravenous Q12H  . enoxaparin (LOVENOX) injection  30 mg Subcutaneous Q24H  . famotidine (PEPCID) IV  20 mg Intravenous Q24H  . metoprolol  5 mg Intravenous Q6H  . sodium chloride  1,000 mL Intravenous Once   Continuous Infusions: . dextrose 110 mL/hr at 11/06/15 0848     LOS: 3 days    Time spent in minutes: 76    [place], MD [facility] Pager: [email or web address] Password [email or web address] 11/06/2015, 11:45 AM

## 2015-11-06 NOTE — Progress Notes (Signed)
Patient has some runs of SVT Dr. Wynelle Cleveland on unit and was notified

## 2015-11-06 NOTE — Progress Notes (Addendum)
Pharmacy Antibiotic Note  [name] is a 82 y.o. male admitted on 11/03/2015 with Aspiration PNA, received Rocephin/Zmax x1 in ED.  Pharmacy has been consulted for Unasyn dosing.  PMH cardiomyopathy, lung lobectomy 1980s  Day #4 antibiotics Renal: SCr worse today  WBC increased No fevers  Plan:  Borderline CrCl with increase in SCR overnight, will adjust Unasyn 3g IV q12hr  Will also adjust enoxaparin to 30mg  SQ q24h  Follow renal function and length of therapy  Height: 5\' 5"  (165.1 cm) Weight: 142 lb 13.7 oz (64.8 kg) IBW/kg (Calculated) : 61.5  Temp (24hrs), Avg:98 F (36.7 C), Min:97.4 F (36.3 C), Max:98.9 F (37.2 C)   Recent Labs Lab 11/03/15 1430 11/03/15 1438 11/04/15 0834 11/05/15 0418 11/06/15 0323  WBC 6.9  --  11.6* 13.7* 16.1*  CREATININE 1.17 1.10 1.30* 1.12 1.36*  LATICACIDVEN  --   --  1.7  --   --     Estimated Creatinine Clearance: 30.1 mL/min (by C-G formula based on SCr of 1.36 mg/dL).    Allergies  Allergen Reactions  . Ciprofloxacin Other (See Comments)    Reaction:  Unknown   . Penicillins Rash and Other (See Comments)    Has patient had a PCN reaction causing immediate rash, facial/tongue/throat swelling, SOB or lightheadedness with hypotension: No Has patient had a PCN reaction causing severe rash involving mucus membranes or skin necrosis: No Has patient had a PCN reaction that required hospitalization No Has patient had a PCN reaction occurring within the last 10 years: No If all of the above answers are "NO", then may proceed with Cephalosporin use.    Antimicrobials this admission: R/Z x 1 8/10 Unasyn 8/10 >>   Dose adjustments this admission: ---  Microbiology results: MRSA PCR: neg  Thank you for allowing pharmacy to be a part of this patient's care.  Doreene Eland, PharmD, BCPS.   Pager: [phone] 11/06/2015 10:17 AM

## 2015-11-06 NOTE — Progress Notes (Signed)
CT reviewed and discussed with Dr. Autumn Patty.  No evidence of Intussusception or bowel compromise. There is hyperdense material present in the small bowel, distal ileum area.  I don't see an indication for urgent surgery at this time so we'll continue the current plan and repeat plain films tomorrow.

## 2015-11-07 ENCOUNTER — Inpatient Hospital Stay (HOSPITAL_COMMUNITY): Payer: Medicare Other

## 2015-11-07 DIAGNOSIS — R9431 Abnormal electrocardiogram [ECG] [EKG]: Secondary | ICD-10-CM

## 2015-11-07 LAB — BASIC METABOLIC PANEL
Anion gap: 7 (ref 5–15)
BUN: 31 mg/dL — ABNORMAL HIGH (ref 6–20)
CHLORIDE: 109 mmol/L (ref 101–111)
CO2: 24 mmol/L (ref 22–32)
Calcium: 8.2 mg/dL — ABNORMAL LOW (ref 8.9–10.3)
Creatinine, Ser: 1.05 mg/dL (ref 0.61–1.24)
GFR calc non Af Amer: 59 mL/min — ABNORMAL LOW (ref >60)
Glucose, Bld: 136 mg/dL — ABNORMAL HIGH (ref 65–99)
Potassium: 3.1 mmol/L — ABNORMAL LOW (ref 3.5–5.1)
Sodium: 140 mmol/L (ref 135–145)

## 2015-11-07 LAB — CBC
HEMATOCRIT: 37.3 % — AB (ref 39.0–52.0)
HEMOGLOBIN: 12.7 g/dL — AB (ref 13.0–17.0)
MCH: 30.5 pg (ref 26.0–34.0)
MCHC: 34 g/dL (ref 30.0–36.0)
MCV: 89.7 fL (ref 78.0–100.0)
Platelets: 245 10*3/uL (ref 150–400)
RBC: 4.16 MIL/uL — AB (ref 4.22–5.81)
RDW: 14.9 % (ref 11.5–15.5)
WBC: 16.4 10*3/uL — ABNORMAL HIGH (ref 4.0–10.5)

## 2015-11-07 LAB — ECHOCARDIOGRAM COMPLETE
HEIGHTINCHES: 65 in
WEIGHTICAEL: 2285.73 [oz_av]

## 2015-11-07 LAB — MAGNESIUM: Magnesium: 1.9 mg/dL (ref 1.7–2.4)

## 2015-11-07 LAB — TSH: TSH: 4.448 u[IU]/mL (ref 0.350–4.500)

## 2015-11-07 LAB — T4, FREE: Free T4: 0.9 ng/dL (ref 0.61–1.12)

## 2015-11-07 MED ORDER — POTASSIUM CHLORIDE 10 MEQ/100ML IV SOLN
10.0000 meq | INTRAVENOUS | Status: AC
  Administered 2015-11-07 (×3): 10 meq via INTRAVENOUS
  Filled 2015-11-07 (×3): qty 100

## 2015-11-07 MED ORDER — CHLORHEXIDINE GLUCONATE 0.12 % MT SOLN
15.0000 mL | Freq: Two times a day (BID) | OROMUCOSAL | Status: DC
  Administered 2015-11-08 – 2015-11-09 (×3): 15 mL via OROMUCOSAL
  Filled 2015-11-07 (×3): qty 15

## 2015-11-07 MED ORDER — DILTIAZEM HCL-DEXTROSE 100-5 MG/100ML-% IV SOLN (PREMIX)
5.0000 mg/h | INTRAVENOUS | Status: DC
  Administered 2015-11-07: 5 mg/h via INTRAVENOUS
  Administered 2015-11-07: 10 mg/h via INTRAVENOUS
  Administered 2015-11-08: 5 mg/h via INTRAVENOUS
  Administered 2015-11-09: 2.5 mg/h via INTRAVENOUS
  Filled 2015-11-07 (×4): qty 100

## 2015-11-07 MED ORDER — SODIUM CHLORIDE 0.9 % IV SOLN
3.0000 g | Freq: Four times a day (QID) | INTRAVENOUS | Status: DC
  Administered 2015-11-07 – 2015-11-09 (×9): 3 g via INTRAVENOUS
  Filled 2015-11-07 (×11): qty 3

## 2015-11-07 MED ORDER — SODIUM CHLORIDE 0.45 % IV SOLN
INTRAVENOUS | Status: DC
  Administered 2015-11-07 – 2015-11-08 (×3): via INTRAVENOUS
  Filled 2015-11-07 (×7): qty 1000

## 2015-11-07 MED ORDER — CETYLPYRIDINIUM CHLORIDE 0.05 % MT LIQD
7.0000 mL | Freq: Two times a day (BID) | OROMUCOSAL | Status: DC
  Administered 2015-11-08 – 2015-11-09 (×2): 7 mL via OROMUCOSAL

## 2015-11-07 MED ORDER — HALOPERIDOL LACTATE 5 MG/ML IJ SOLN
2.0000 mg | Freq: Once | INTRAMUSCULAR | Status: AC
Start: 2015-11-07 — End: 2015-11-07
  Administered 2015-11-07: 2 mg via INTRAVENOUS
  Filled 2015-11-07: qty 1

## 2015-11-07 MED ORDER — HALOPERIDOL LACTATE 5 MG/ML IJ SOLN
3.0000 mg | Freq: Once | INTRAMUSCULAR | Status: AC
  Administered 2015-11-07: 3 mg via INTRAVENOUS
  Filled 2015-11-07: qty 1

## 2015-11-07 MED ORDER — ENOXAPARIN SODIUM 40 MG/0.4ML ~~LOC~~ SOLN
40.0000 mg | SUBCUTANEOUS | Status: DC
  Administered 2015-11-07 – 2015-11-08 (×2): 40 mg via SUBCUTANEOUS
  Filled 2015-11-07 (×2): qty 0.4

## 2015-11-07 NOTE — Progress Notes (Signed)
  Echocardiogram 2D Echocardiogram has been performed.  [name] 11/07/2015, 10:06 AM

## 2015-11-07 NOTE — Progress Notes (Signed)
PROGRESS NOTE    [name]  [id number] DOB: 1921/04/27 DOA: 11/03/2015  PCP: Jenny Reichmann, MD   Brief Narrative:  HAWTHORN PATI is a 82 y.o. male with medical history significant of cardiomyopathy, lung lobectomy in 1980s, Barretts esophagus, vertebral compression fracture who developed sudden onset abdominal pain and vomited once while at [employer] around 11 pm. No fever or chills.  Found to have intussusception of ileum. Also found to be hypoxic and thought to have aspirated while vomiting.   Subjective: No complaints    Assessment & Plan:   Principal Problem:   Small bowel obstruction  - due to intussusception - appears to be resolving- not much NG tube output- surgery following  Active Problems: Acute hypoxic resp failure - ? Aspiration pneumonia- cont Unasyn- repeat CXR shows bibasilar opacities with hypo expended lungs- ? Atelectasis - cont Unasyn for now- has been weaned off of O2  A-fib with RVR - no prior history of this - given IV Metoprolol- converting back and forth from NSR to A-fib- will start Cardizem infusion - ECHO ordered- checked thyroid studies which are normal  AKI- pleural effusions  - due to dehydration/ hypotension? - cont slow hydration and follow I and O- has developed small pleural effusions and therefore do not need to be aggressive with IVF  - Cr has improved to baseline.   Hypokalemia  replacing  Chronic combined systolic and diastolic CHF (congestive heart failure) - EF 45 %, gr 1 d CHF - compensated- monitor I and O  Hypernatremia - change IVF to D5W  HTN - Lasix, Lisinopril/ HCTZ and Metoprolol on hold - IV Lopressor for now  Gastritis/ GERD? - PPI on hold as shortage of IV Protonix and unable to give oral PPI - Pepcid IV   DVT prophylaxis: Lovenox Code Status: Full code Family Communication: son, Marya Amsler Disposition Plan: to be determined Consultants:   gen surgery Procedures:    Antimicrobials:    Anti-infectives    Start     Dose/Rate Route Frequency Ordered Stop   11/07/15 1600  Ampicillin-Sulbactam (UNASYN) 3 g in sodium chloride 0.9 % 100 mL IVPB     3 g 100 mL/hr over 60 Minutes Intravenous Every 6 hours 11/07/15 1024     11/06/15 2200  Ampicillin-Sulbactam (UNASYN) 3 g in sodium chloride 0.9 % 100 mL IVPB  Status:  Discontinued     3 g 100 mL/hr over 60 Minutes Intravenous Every 12 hours 11/06/15 1020 11/07/15 1024   11/03/15 1930  Ampicillin-Sulbactam (UNASYN) 3 g in sodium chloride 0.9 % 100 mL IVPB  Status:  Discontinued     3 g 100 mL/hr over 60 Minutes Intravenous Every 6 hours 11/03/15 1848 11/06/15 1020   11/03/15 1600  cefTRIAXone (ROCEPHIN) 1 g in dextrose 5 % 50 mL IVPB     1 g 100 mL/hr over 30 Minutes Intravenous  Once 11/03/15 1558 11/03/15 1636   11/03/15 1600  azithromycin (ZITHROMAX) 500 mg in dextrose 5 % 250 mL IVPB     500 mg 250 mL/hr over 60 Minutes Intravenous  Once 11/03/15 1558 11/03/15 1858       Objective: Vitals:   11/07/15 0800 11/07/15 0900 11/07/15 1144 11/07/15 1200  BP: 140/80 130/89  120/87  Pulse:      Resp: (!) 26 (!) 31  16  Temp:   98.5 F (36.9 C)   TempSrc:   Oral   SpO2: 100%   94%  Weight:  Height:        Intake/Output Summary (Last 24 hours) at 11/07/15 1228 Last data filed at 11/07/15 0500  Gross per 24 hour  Intake          1746.42 ml  Output              725 ml  Net          1021.42 ml   Filed Weights   11/03/15 1930 11/05/15 0400 11/06/15 0500  Weight: 62.8 kg (138 lb 7.2 oz) 65.8 kg (145 lb 1 oz) 64.8 kg (142 lb 13.7 oz)    Examination: General exam: Appears comfortable  HEENT: PERRLA, oral mucosa moist, no sclera icterus or thrush Respiratory system: Clear to auscultation. Respiratory effort normal. Pulse ox 96 % on room air  Cardiovascular system: S1 & S2 heard, RRR.  No murmurs  Gastrointestinal system: Abdomen soft, non-tender, nondistended. Audible but decreased bowel sounds. NG tube in place.   No organomegaly Central nervous system: Alert and oriented. No focal neurological deficits. Extremities: No cyanosis, clubbing or edema Skin: No rashes or ulcers Psychiatry:  Mood & affect appropriate.     Data Reviewed: I have personally reviewed following labs and imaging studies  CBC:  Recent Labs Lab 11/03/15 1430 11/03/15 1438 11/04/15 0834 11/05/15 0418 11/06/15 0323 11/07/15 0725  WBC 6.9  --  11.6* 13.7* 16.1* 16.4*  NEUTROABS 5.2  --   --  11.8* 14.4*  --   HGB 12.8* 13.9 12.6* 11.7* 12.3* 12.7*  HCT 38.4* 41.0 37.5* 34.6* 37.0* 37.3*  MCV 90.6  --  89.9 90.1 91.4 89.7  PLT 216  --  213 205 229 [id number]   Basic Metabolic Panel:  Recent Labs Lab 11/03/15 1430 11/03/15 1438 11/04/15 0834 11/05/15 0418 11/06/15 0323 11/07/15 0725  NA 138 140 142 144 146* 140  K 4.7 4.6 3.6 3.1* 4.0 3.1*  CL 104 102 106 111 113* 109  CO2 25  --  28 25 22 24   GLUCOSE 171* 168* 96 109* 104* 136*  BUN 30* 45* 32* 30* 31* 31*  CREATININE 1.17 1.10 1.30* 1.12 1.36* 1.05  CALCIUM 8.0*  --  7.6* 7.7* 8.3* 8.2*   GFR: Estimated Creatinine Clearance: 39 mL/min (by C-G formula based on SCr of 1.05 mg/dL). Liver Function Tests:  Recent Labs Lab 11/03/15 1430  AST 66*  ALT 42  ALKPHOS 137*  BILITOT 1.2  PROT 6.1*  ALBUMIN 3.2*    Recent Labs Lab 11/03/15 1430  LIPASE 30   No results for input(s): AMMONIA in the last 168 hours. Coagulation Profile: No results for input(s): INR, PROTIME in the last 168 hours. Cardiac Enzymes: No results for input(s): CKTOTAL, CKMB, CKMBINDEX, TROPONINI in the last 168 hours. BNP (last 3 results) No results for input(s): PROBNP in the last 8760 hours. HbA1C: No results for input(s): HGBA1C in the last 72 hours. CBG: No results for input(s): GLUCAP in the last 168 hours. Lipid Profile: No results for input(s): CHOL, HDL, LDLCALC, TRIG, CHOLHDL, LDLDIRECT in the last 72 hours. Thyroid Function Tests:  Recent Labs  11/07/15 0707  11/07/15 0725  TSH 4.448  --   FREET4  --  0.90   Anemia Panel: No results for input(s): VITAMINB12, FOLATE, FERRITIN, TIBC, IRON, RETICCTPCT in the last 72 hours. Urine analysis:    Component Value Date/Time   COLORURINE YELLOW 11/03/2015 [place] 11/03/2015 1420   LABSPEC 1.038 (H) 11/03/2015 1420   PHURINE 6.0 11/03/2015 1420  GLUCOSEU NEGATIVE 11/03/2015 1420   HGBUR NEGATIVE 11/03/2015 1420   BILIRUBINUR NEGATIVE 11/03/2015 1420   BILIRUBINUR neg 08/18/2013 0848   KETONESUR NEGATIVE 11/03/2015 1420   PROTEINUR NEGATIVE 11/03/2015 1420   UROBILINOGEN 0.2 11/30/2013 0804   NITRITE NEGATIVE 11/03/2015 1420   LEUKOCYTESUR NEGATIVE 11/03/2015 1420   Sepsis Labs: @LABRCNTIP (procalcitonin:4,lacticidven:4) ) Recent Results (from the past 240 hour(s))  MRSA PCR Screening     Status: None   Collection Time: 11/03/15  9:50 PM  Result Value Ref Range Status   MRSA by PCR NEGATIVE NEGATIVE Final    Comment:        The GeneXpert MRSA Assay (FDA approved for NASAL specimens only), is one component of a comprehensive MRSA colonization surveillance program. It is not intended to diagnose MRSA infection nor to guide or monitor treatment for MRSA infections.          Radiology Studies: Ct Abdomen Pelvis Wo Contrast  Result Date: 11/06/2015 CLINICAL DATA:  Intussusception with SBO KUB unremarkable, but distended. No flatus or BM. Continue NGT Still confused, in gloves. No abdominal pain. Difficult to assess. Now on 3rd day of NGT. EXAM: CT ABDOMEN AND PELVIS WITHOUT CONTRAST TECHNIQUE: Multidetector CT imaging of the abdomen and pelvis was performed following the standard protocol without IV contrast. COMPARISON:  11/03/2015 FINDINGS: Lung bases: There is dependent lung base opacity associated with densely calcified pleural plaques and minimal pleural effusions. Lung base opacity most consistent with atelectasis. Heart is mildly enlarged. No convincing pulmonary  edema. Hepatobiliary: Liver is unremarkable. Gallbladder surgically absent. No bile duct dilation. Spleen: 7 mm low-density lesion posteriorly. This is consistent with a cyst and is stable. There calcified splenic granuloma, also stable. Spleen normal in size. Pancreas: Unremarkable. Adrenal glands:  No masses. Kidneys, ureters, bladder: Bilateral renal cortical thinning. Small hyper attenuating lesion from the lateral lower pole the left kidney measuring 7 mm, likely a cyst complicated by previous hemorrhage or proteinaceous contents. It is stable. No other renal masses, no stones and no hydronephrosis. Normal ureters. Bladder is unremarkable. Vascular: Diffuse ectasia of the abdominal aorta with atherosclerotic calcifications. Infrarenal aorta measures 3 cm greatest diameter. Loss Lymph nodes:  No adenopathy. Ascites: Small amount ascites noted adjacent to liver, along the pericolic gutters and in the posterior pelvic recess. Gastrointestinal: There are 2 contiguous densities in the distal ileum, 1 measuring 19 mm and the other 2.3 cm in greatest dimension. The current exam, this does not appear to be an intussusception but dense material within the small bowel. Bowel distal this is decompressed while bowel proximal to this is mildly distended. The colon is mostly decompressed. There is no bowel wall thickening. Normal appendix is visualized. Musculoskeletal: Well-positioned left hip total arthroplasty. Bones diffusely demineralized. There is a marked compression fracture of L2, which appears chronic. Mild compression fractures of T12 and T8. Bones are diffusely demineralized. IMPRESSION: 1. Partial small bowel obstruction similar to the prior CT. What was felt be an ileal intussusception on the prior study now appears more consistent with 2 adjacent densities, consistent with stones or dense stool, in the distal ileum. These may or may not be the cause of the obstruction. Bowel distal to these densities is  decompressed. There is currently no convincing intussusception. 2. Small amount ascites, increased when compared the prior CT. 3. Minimal pleural effusions and dependent lung base atelectasis, increased from the prior study. Densely calcified pleural plaque at the lung bases is stable. 4. Aortic atherosclerosis and aortic ectasia, of the infrarenal  abdominal aorta measuring 3 cm. Recommend followup by ultrasound in 3 years. This recommendation follows ACR consensus guidelines: White Paper of the ACR Incidental Findings Committee II on Vascular Findings. [facility] Radiol 2013; 10:789-794 Electronically Signed   By: Lajean Manes M.D.   On: 11/06/2015 11:25   Dg Abd 1 View  Result Date: 11/06/2015 CLINICAL DATA:  Followup for intussusception. EXAM: ABDOMEN - 1 VIEW COMPARISON:  11/05/2015 FINDINGS: Nasogastric tube passes below the diaphragm into the proximal stomach. There is mild small bowel dilation central abdomen. Air is seen within a normal caliber colon. There is some rectal air. Small bowel distention appears increased when compared to the previous day's study. IMPRESSION: 1. Mild small bowel dilation increased when compared to the previous day's study. 2. Nasogastric tube is well positioned. Electronically Signed   By: Lajean Manes M.D.   On: 11/06/2015 08:55   Dg Chest Port 1 View  Result Date: 11/07/2015 CLINICAL DATA:  Nasogastric tube. EXAM: PORTABLE CHEST 1 VIEW COMPARISON:  11/04/2015. FINDINGS: NG tube noted coiled stomach. Cardiomegaly with pulmonary vascular prominence interstitial prominence. No focal infiltrate. Mild bibasilar atelectasis. No pleural effusion or pneumothorax. IMPRESSION: 1. NG tube noted coiled stomach. 2. Cardiomegaly with pulmonary interstitial prominence consistent with congestive heart failure and interstitial edema. Small left pleural effusion. 3. Low lung volumes with bibasilar atelectasis . Electronically Signed   By: Marcello Moores  Register   On: 11/07/2015 09:00   Dg  Abd Portable 1v  Result Date: 11/07/2015 CLINICAL DATA:  Abdominal distention. EXAM: PORTABLE ABDOMEN - 1 VIEW COMPARISON:  CT 11/06/2015.  Abdominal series 813 2017, 11/05/2015 FINDINGS: NG tube noted coiled in the stomach. Progressive distention of multiple loops of small bowel are noted. Paucity of colonic gas noted. No pneumatosis noted. No free air identified. Severe degenerative changes and scoliosis lumbar spine. Degenerative changes right hip. Left hip prosthesis. Diffuse osteopenia. Surgical sutures left groin. IMPRESSION: 1. NG tube noted coiled stomach. 2. Progressive distention of multiple loops of small bowel. Small bowel loops are noted to be dilated up to 4.9 cm. Findings consistent with small-bowel obstruction. Electronically Signed   By: Marcello Moores  Register   On: 11/07/2015 06:43      Scheduled Meds: . ampicillin-sulbactam (UNASYN) IV  3 g Intravenous Q6H  . enoxaparin (LOVENOX) injection  40 mg Subcutaneous Q24H  . famotidine (PEPCID) IV  20 mg Intravenous Q24H  . metoprolol  5 mg Intravenous Q6H  . sodium chloride  1,000 mL Intravenous Once   Continuous Infusions: . diltiazem (CARDIZEM) infusion 10 mg/hr (11/07/15 0901)     LOS: 4 days    Time spent in minutes: 52    [place], MD [facility] Pager: [email or web address] Password [email or web address] 11/07/2015, 12:28 PM

## 2015-11-07 NOTE — Progress Notes (Signed)
Repositioned NGT and secured, had second nurse, Bri, check placement, 45ml emesis output noted since repositioning

## 2015-11-07 NOTE — Progress Notes (Signed)
Pharmacy Antibiotic Note  [name] is a 82 y.o. male admitted on 11/03/2015 with Aspiration PNA, received Rocephin/Zmax x1 in ED.  Pharmacy has been consulted for Unasyn dosing.  PMH cardiomyopathy, lung lobectomy 1980s  Day #5 antibiotics Renal: SCr better oday  WBC unchanged No fevers  Plan:  Improved CrCl now > 30 ml/min - will change Unasyn from 3g q12 to 3g q6  Will also adjust Lovenox from 30mg  to 40mg  daily for CrCl > 30 ml/min  Follow renal function and length of therapy  Height: 5\' 5"  (165.1 cm) Weight: 142 lb 13.7 oz (64.8 kg) IBW/kg (Calculated) : 61.5  Temp (24hrs), Avg:98.2 F (36.8 C), Min:97.8 F (36.6 C), Max:99.1 F (37.3 C)   Recent Labs Lab 11/03/15 1430 11/03/15 1438 11/04/15 0834 11/05/15 0418 11/06/15 0323 11/07/15 0725  WBC 6.9  --  11.6* 13.7* 16.1* 16.4*  CREATININE 1.17 1.10 1.30* 1.12 1.36* 1.05  LATICACIDVEN  --   --  1.7  --   --   --     Estimated Creatinine Clearance: 39 mL/min (by C-G formula based on SCr of 1.05 mg/dL).    Allergies  Allergen Reactions  . Ciprofloxacin Other (See Comments)    Reaction:  Unknown   . Penicillins Rash and Other (See Comments)    Has patient had a PCN reaction causing immediate rash, facial/tongue/throat swelling, SOB or lightheadedness with hypotension: No Has patient had a PCN reaction causing severe rash involving mucus membranes or skin necrosis: No Has patient had a PCN reaction that required hospitalization No Has patient had a PCN reaction occurring within the last 10 years: No If all of the above answers are "NO", then may proceed with Cephalosporin use.    Antimicrobials this admission: R/Z x 1 8/10 x 1 Unasyn 8/10 >>   Dose adjustments this admission: ---  Microbiology results: MRSA PCR: neg  Thank you for allowing pharmacy to be a part of this patient's care.  Adrian Saran, PharmD, BCPS Pager [phone] 11/07/2015 10:22 AM
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
## 2015-11-08 ENCOUNTER — Inpatient Hospital Stay (HOSPITAL_COMMUNITY): Payer: Medicare Other

## 2015-11-08 LAB — BASIC METABOLIC PANEL
Anion gap: 5 (ref 5–15)
BUN: 28 mg/dL — AB (ref 6–20)
CHLORIDE: 111 mmol/L (ref 101–111)
CO2: 25 mmol/L (ref 22–32)
Calcium: 8 mg/dL — ABNORMAL LOW (ref 8.9–10.3)
Creatinine, Ser: 1.12 mg/dL (ref 0.61–1.24)
GFR calc Af Amer: >60 mL/min (ref >60)
GFR calc non Af Amer: 55 mL/min — ABNORMAL LOW (ref >60)
GLUCOSE: 99 mg/dL (ref 65–99)
POTASSIUM: 4.9 mmol/L (ref 3.5–5.1)
Sodium: 141 mmol/L (ref 135–145)

## 2015-11-08 MED ORDER — LEVALBUTEROL HCL 0.63 MG/3ML IN NEBU: 0.6300 mg | INHALATION_SOLUTION | Freq: Four times a day (QID) | RESPIRATORY_TRACT | Status: DC | PRN

## 2015-11-08 MED ORDER — HALOPERIDOL LACTATE 5 MG/ML IJ SOLN
2.5000 mg | Freq: Once | INTRAMUSCULAR | Status: AC
  Administered 2015-11-08: 2.5 mg via INTRAVENOUS
  Filled 2015-11-08: qty 1

## 2015-11-08 MED ORDER — ALBUTEROL SULFATE (2.5 MG/3ML) 0.083% IN NEBU
2.5000 mg | INHALATION_SOLUTION | RESPIRATORY_TRACT | Status: DC | PRN
  Administered 2015-11-08: 2.5 mg via RESPIRATORY_TRACT
  Filled 2015-11-08: qty 3

## 2015-11-08 MED ORDER — HALOPERIDOL LACTATE 5 MG/ML IJ SOLN: 2.0000 mg | Freq: Four times a day (QID) | INTRAMUSCULAR | Status: DC | PRN
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
## 2015-11-08 NOTE — Progress Notes (Signed)
Subjective: Remains confused.  Pulled ng tube out.  Objective: Vital signs in last 24 hours: Temp:  [97.4 F (36.3 C)-98.5 F (36.9 C)] 97.6 F (36.4 C) (08/15 0800) Pulse Rate:  [63-81] 63 (08/15 0600) Resp:  [16-29] 19 (08/15 0600) BP: (97-133)/(40-90) 97/47 (08/15 0600) SpO2:  [90 %-98 %] 98 % (08/15 0600) Last BM Date: 11/07/15  Intake/Output from previous day: 08/14 0701 - 08/15 0700 In: 2423.8 [I.V.:1923.8; IV Piggyback:500] Out: 600 [Urine:400; Emesis/NG output:200] Intake/Output this shift: No intake/output data recorded.  PE: General- In NAD Abdomen-slightly firm and distended, intermittent high pitched bowel sounds, no tenderness  Lab Results:   Recent Labs  11/06/15 0323 11/07/15 0725  WBC 16.1* 16.4*  HGB 12.3* 12.7*  HCT 37.0* 37.3*  PLT 229 245   BMET  Recent Labs  11/07/15 0725 11/08/15 0746  NA 140 141  K 3.1* 4.9  CL 109 111  CO2 24 25  GLUCOSE 136* 99  BUN 31* 28*  CREATININE 1.05 1.12  CALCIUM 8.2* 8.0*   PT/INR No results for input(s): LABPROT, INR in the last 72 hours. Comprehensive Metabolic Panel:    Component Value Date/Time   NA 141 11/08/2015 0746   NA 140 11/07/2015 0725   K 4.9 11/08/2015 0746   K 3.1 (L) 11/07/2015 0725   CL 111 11/08/2015 0746   CL 109 11/07/2015 0725   CO2 25 11/08/2015 0746   CO2 24 11/07/2015 0725   BUN 28 (H) 11/08/2015 0746   BUN 31 (H) 11/07/2015 0725   CREATININE 1.12 11/08/2015 0746   CREATININE 1.05 11/07/2015 0725   CREATININE 1.09 06/30/2015 0949   CREATININE 1.20 (H) 03/16/2015 0936   GLUCOSE 99 11/08/2015 0746   GLUCOSE 136 (H) 11/07/2015 0725   CALCIUM 8.0 (L) 11/08/2015 0746   CALCIUM 8.2 (L) 11/07/2015 0725   AST 66 (H) 11/03/2015 1430   AST 11 03/16/2015 0936   ALT 42 11/03/2015 1430   ALT 9 03/16/2015 0936   ALKPHOS 137 (H) 11/03/2015 1430   ALKPHOS 180 (H) 03/16/2015 0936   BILITOT 1.2 11/03/2015 1430   BILITOT 0.8 03/16/2015 0936   PROT 6.1 (L) 11/03/2015 1430    PROT 6.7 03/16/2015 0936   ALBUMIN 3.2 (L) 11/03/2015 1430   ALBUMIN 3.8 03/16/2015 0936     Studies/Results:  ECHO:  Left ventricle: The cavity size was normal. Wall thickness was   increased in a pattern of mild LVH. Indeterminant diastolic   function (atrial fibrillation). Systolic function was severely   reduced. The estimated ejection fraction was in the range of 25%   to 30%. Diffuse hypokinesis but lateral wall function looked   better than other walls. - Aortic valve: There was no stenosis. There was trivial   regurgitation. - Mitral valve: Mildly calcified annulus. There was no significant   regurgitation. - Left atrium: The atrium was mildly dilated. - Right ventricle: The cavity size was normal. Systolic function   was mildly to moderately reduced. - Tricuspid valve: Peak RV-RA gradient (S): 31 mm Hg. - Systemic veins: IVC was not visualized.  Ct Abdomen Pelvis Wo Contrast  Result Date: 11/06/2015 CLINICAL DATA:  Intussusception with SBO KUB unremarkable, but distended. No flatus or BM. Continue NGT Still confused, in gloves. No abdominal pain. Difficult to assess. Now on 3rd day of NGT. EXAM: CT ABDOMEN AND PELVIS WITHOUT CONTRAST TECHNIQUE: Multidetector CT imaging of the abdomen and pelvis was performed following the standard protocol without IV contrast. COMPARISON:  11/03/2015 FINDINGS: Lung  bases: There is dependent lung base opacity associated with densely calcified pleural plaques and minimal pleural effusions. Lung base opacity most consistent with atelectasis. Heart is mildly enlarged. No convincing pulmonary edema. Hepatobiliary: Liver is unremarkable. Gallbladder surgically absent. No bile duct dilation. Spleen: 7 mm low-density lesion posteriorly. This is consistent with a cyst and is stable. There calcified splenic granuloma, also stable. Spleen normal in size. Pancreas: Unremarkable. Adrenal glands:  No masses. Kidneys, ureters, bladder: Bilateral renal cortical  thinning. Small hyper attenuating lesion from the lateral lower pole the left kidney measuring 7 mm, likely a cyst complicated by previous hemorrhage or proteinaceous contents. It is stable. No other renal masses, no stones and no hydronephrosis. Normal ureters. Bladder is unremarkable. Vascular: Diffuse ectasia of the abdominal aorta with atherosclerotic calcifications. Infrarenal aorta measures 3 cm greatest diameter. Loss Lymph nodes:  No adenopathy. Ascites: Small amount ascites noted adjacent to liver, along the pericolic gutters and in the posterior pelvic recess. Gastrointestinal: There are 2 contiguous densities in the distal ileum, 1 measuring 19 mm and the other 2.3 cm in greatest dimension. The current exam, this does not appear to be an intussusception but dense material within the small bowel. Bowel distal this is decompressed while bowel proximal to this is mildly distended. The colon is mostly decompressed. There is no bowel wall thickening. Normal appendix is visualized. Musculoskeletal: Well-positioned left hip total arthroplasty. Bones diffusely demineralized. There is a marked compression fracture of L2, which appears chronic. Mild compression fractures of T12 and T8. Bones are diffusely demineralized. IMPRESSION: 1. Partial small bowel obstruction similar to the prior CT. What was felt be an ileal intussusception on the prior study now appears more consistent with 2 adjacent densities, consistent with stones or dense stool, in the distal ileum. These may or may not be the cause of the obstruction. Bowel distal to these densities is decompressed. There is currently no convincing intussusception. 2. Small amount ascites, increased when compared the prior CT. 3. Minimal pleural effusions and dependent lung base atelectasis, increased from the prior study. Densely calcified pleural plaque at the lung bases is stable. 4. Aortic atherosclerosis and aortic ectasia, of the infrarenal abdominal aorta  measuring 3 cm. Recommend followup by ultrasound in 3 years. This recommendation follows ACR consensus guidelines: White Paper of the ACR Incidental Findings Committee II on Vascular Findings. Nat[facility] Coll Radiol 2013; 10:789-794 Electronically Signed   By: Lajean Manes M.D.   On: 11/06/2015 11:25   Dg Chest Port 1 View  Result Date: 11/08/2015 CLINICAL DATA:  Hypertension.  Recent bowel intussusception EXAM: PORTABLE CHEST 1 VIEW COMPARISON:  November 07, 2015 FINDINGS: Nasogastric tube has been removed. There is patchy atelectasis in the right base region. Calcification on the right medially is likely due to prior emphysema, stable. No new opacity identified. Heart is upper normal in size with pulmonary vascularity within normal limits. There is atherosclerotic calcification aorta. Bones are osteoporotic. There is superior migration of each humeral head. IMPRESSION: Probable prior empyema on the right medially with calcification. Right base atelectasis. Lungs elsewhere clear. Stable cardiac silhouette. There is aortic atherosclerosis. There is evidence of bilateral chronic rotator cuff tears. Electronically Signed   By: Lowella Grip III M.D.   On: 11/08/2015 09:31   Dg Chest Port 1 View  Result Date: 11/07/2015 CLINICAL DATA:  Nasogastric tube. EXAM: PORTABLE CHEST 1 VIEW COMPARISON:  11/04/2015. FINDINGS: NG tube noted coiled stomach. Cardiomegaly with pulmonary vascular prominence interstitial prominence. No focal infiltrate. Mild bibasilar  atelectasis. No pleural effusion or pneumothorax. IMPRESSION: 1. NG tube noted coiled stomach. 2. Cardiomegaly with pulmonary interstitial prominence consistent with congestive heart failure and interstitial edema. Small left pleural effusion. 3. Low lung volumes with bibasilar atelectasis . Electronically Signed   By: Marcello Moores  Register   On: 11/07/2015 09:00   Dg Abd Portable 1v  Result Date: 11/08/2015 CLINICAL DATA:  Small-bowel obstruction EXAM: PORTABLE  ABDOMEN - 1 VIEW COMPARISON:  11/07/2015 FINDINGS: There is again noted small-bowel obstruction similar to that seen on the prior exam. Nasogastric catheter is not well appreciated on this exam. No definitive free air is seen. Left hip replacement and degenerative change of the lumbar spine are again noted. IMPRESSION: Persistent small bowel obstruction Electronically Signed   By: Inez Catalina M.D.   On: 11/08/2015 09:35   Dg Abd Portable 1v  Result Date: 11/07/2015 CLINICAL DATA:  Abdominal distention. EXAM: PORTABLE ABDOMEN - 1 VIEW COMPARISON:  CT 11/06/2015.  Abdominal series 813 2017, 11/05/2015 FINDINGS: NG tube noted coiled in the stomach. Progressive distention of multiple loops of small bowel are noted. Paucity of colonic gas noted. No pneumatosis noted. No free air identified. Severe degenerative changes and scoliosis lumbar spine. Degenerative changes right hip. Left hip prosthesis. Diffuse osteopenia. Surgical sutures left groin. IMPRESSION: 1. NG tube noted coiled stomach. 2. Progressive distention of multiple loops of small bowel. Small bowel loops are noted to be dilated up to 4.9 cm. Findings consistent with small-bowel obstruction. Electronically Signed   By: Marcello Moores  Register   On: 11/07/2015 06:43    Anti-infectives: Anti-infectives    Start     Dose/Rate Route Frequency Ordered Stop   11/07/15 1600  Ampicillin-Sulbactam (UNASYN) 3 g in sodium chloride 0.9 % 100 mL IVPB     3 g 100 mL/hr over 60 Minutes Intravenous Every 6 hours 11/07/15 1024     11/06/15 2200  Ampicillin-Sulbactam (UNASYN) 3 g in sodium chloride 0.9 % 100 mL IVPB  Status:  Discontinued     3 g 100 mL/hr over 60 Minutes Intravenous Every 12 hours 11/06/15 1020 11/07/15 1024   11/03/15 1930  Ampicillin-Sulbactam (UNASYN) 3 g in sodium chloride 0.9 % 100 mL IVPB  Status:  Discontinued     3 g 100 mL/hr over 60 Minutes Intravenous Every 6 hours 11/03/15 1848 11/06/15 1020   11/03/15 1600  cefTRIAXone (ROCEPHIN) 1 g  in dextrose 5 % 50 mL IVPB     1 g 100 mL/hr over 30 Minutes Intravenous  Once 11/03/15 1558 11/03/15 1636   11/03/15 1600  azithromycin (ZITHROMAX) 500 mg in dextrose 5 % 250 mL IVPB     500 mg 250 mL/hr over 60 Minutes Intravenous  Once 11/03/15 1558 11/03/15 1858      Assessment Principal Problem:   Small bowel obstruction (HCC)-persistent and likely to due hyperdense lesion in distal ileum  Active Problems:     A-fib (HCC)   Chronic combined systolic and diastolic CHF (congestive heart failure) (HCC)   Aspiration pneumonia    LOS: 5 days   Plan: He may eventually require surgery once more medically stable.  Other option is palliative care. Will try ng again.   [name] 11/08/2015

## 2015-11-08 NOTE — Progress Notes (Addendum)
PROGRESS NOTE    [name]  [id number] DOB: Sep 29, 1921 DOA: 11/03/2015  PCP: Jenny Reichmann, MD   Brief Narrative:  [name] is a 82 y.o. male with medical history significant of cardiomyopathy, lung lobectomy in 1980s, Barretts esophagus, vertebral compression fracture who is quite independent at baseline and developed sudden onset abdominal pain and vomited once while at [employer] around 11 pm. No fever or chills.  Found to have intussusception of ileum. Also found to be hypoxic and thought to have aspirated while vomiting. While being in the hospital he has developed new onset paroxysmal  A-fib with RVR. HR has been difficult to control and is being managed with IV Cardizem in addition to IV Lopressor. Also found to have a new drop in EF from 45 to 25%.   Subjective: No complaints    Assessment & Plan:   Principal Problem:   Small bowel obstruction  - due to intussusception vs obstruction in distal ileum -  surgery following- NG output had improved to < 300 in 24 hrs however, he pulled NG yesterday and this is now being replaced per surgery  Active Problems: Acute hypoxic resp failure - ? Aspiration pneumonia- started on Unasyn in ER and eventually was weaned to room air-  repeat CXR showed bibasilar opacities with hypo expended lungs- - noted to be back on O2 today which apparently occurred sometime last night- has some congestion and wheeze- CXR does note show any change, no infiltrates and no pulm edema - may have aspirated again last night? - cont Unasyn and O2 - PRN nebs - Xopenex as he has A-fib  A-fib with RVR - no prior history of this - given IV Metoprolol- converting back and forth from NSR to A-fib with RVR - cont Cardizem infusion for today- if he remained in NSR, can stop Cardizem and maintain on Metoprolol - ECHO ordered-EF 25-30%  checked thyroid studies which are normal  Systolic CHF - chronic - previously  EF 45 %, gr 1 d CHF - ECHO shows a new  finding of EF of 25-30% with mild to mod right heart failure as well- mild Pulm HTN - follow I and O carefully to prevent fluid overload in acute setting - on B blocker at this time- will need ACE I / ARB as tolerated if he recovers from SBO  AKI- pleural effusions  - due to dehydration/ hypotension? - cont slow hydration and follow I and O- has developed small pleural effusions and therefore do not need to be aggressive with IVF  - Cr has improved to baseline.   Acute delirium - PRN Haldol and sitter- he does better when family is at bedside - he is routinely not agitated at home and as mentioned, is quite oriented and independent  Hypokalemia  replacing and following  Hypernatremia -resolved with changed IVF to lower sodium concentration  HTN - Lasix, Lisinopril/ HCTZ and Metoprolol on hold - IV Lopressor for now  Gastritis/ GERD? - PPI on hold as shortage of IV Protonix and unable to give oral PPI - Pepcid IV  Very hard of hearing - has appt for hearing aid on Friday   DVT prophylaxis: Lovenox Code Status: Full code Family Communication: son, Marya Amsler Disposition Plan: to be determined Consultants:   gen surgery Procedures:    Antimicrobials:  Anti-infectives    Start     Dose/Rate Route Frequency Ordered Stop   11/07/15 1600  Ampicillin-Sulbactam (UNASYN) 3 g in sodium chloride 0.9 %  100 mL IVPB     3 g 100 mL/hr over 60 Minutes Intravenous Every 6 hours 11/07/15 1024     11/06/15 2200  Ampicillin-Sulbactam (UNASYN) 3 g in sodium chloride 0.9 % 100 mL IVPB  Status:  Discontinued     3 g 100 mL/hr over 60 Minutes Intravenous Every 12 hours 11/06/15 1020 11/07/15 1024   11/03/15 1930  Ampicillin-Sulbactam (UNASYN) 3 g in sodium chloride 0.9 % 100 mL IVPB  Status:  Discontinued     3 g 100 mL/hr over 60 Minutes Intravenous Every 6 hours 11/03/15 1848 11/06/15 1020   11/03/15 1600  cefTRIAXone (ROCEPHIN) 1 g in dextrose 5 % 50 mL IVPB     1 g 100 mL/hr over 30  Minutes Intravenous  Once 11/03/15 1558 11/03/15 1636   11/03/15 1600  azithromycin (ZITHROMAX) 500 mg in dextrose 5 % 250 mL IVPB     500 mg 250 mL/hr over 60 Minutes Intravenous  Once 11/03/15 1558 11/03/15 1858       Objective: Vitals:   11/08/15 0600 11/08/15 0749 11/08/15 0800 11/08/15 1200  BP: (!) 97/47   136/78  Pulse: 63   80  Resp: 19   (!) 23  Temp:  97.4 F (36.3 C) 97.6 F (36.4 C) 97.8 F (36.6 C)  TempSrc:      SpO2: 98%   96%  Weight:      Height:        Intake/Output Summary (Last 24 hours) at 11/08/15 1329 Last data filed at 11/08/15 0606  Gross per 24 hour  Intake          2423.83 ml  Output              600 ml  Net          1823.83 ml   Filed Weights   11/03/15 1930 11/05/15 0400 11/06/15 0500  Weight: 62.8 kg (138 lb 7.2 oz) 65.8 kg (145 lb 1 oz) 64.8 kg (142 lb 13.7 oz)    Examination: General exam: Appears comfortable  HEENT: PERRLA, oral mucosa moist, no sclera icterus or thrush Respiratory system: Clear to auscultation. Respiratory effort normal. Pulse ox 96 % on room air  Cardiovascular system: S1 & S2 heard, RRR.  No murmurs  Gastrointestinal system: Abdomen soft, non-tender, nondistended. Audible but decreased bowel sounds.    No organomegaly Central nervous system: Alert and oriented to place. No focal neurological deficits. Extremities: No cyanosis, clubbing or edema Skin: No rashes or ulcers Psychiatry:  Mood & affect appropriate.     Data Reviewed: I have personally reviewed following labs and imaging studies  CBC:  Recent Labs Lab 11/03/15 1430 11/03/15 1438 11/04/15 0834 11/05/15 0418 11/06/15 0323 11/07/15 0725  WBC 6.9  --  11.6* 13.7* 16.1* 16.4*  NEUTROABS 5.2  --   --  11.8* 14.4*  --   HGB 12.8* 13.9 12.6* 11.7* 12.3* 12.7*  HCT 38.4* 41.0 37.5* 34.6* 37.0* 37.3*  MCV 90.6  --  89.9 90.1 91.4 89.7  PLT 216  --  213 205 229 [id number]   Basic Metabolic Panel:  Recent Labs Lab 11/04/15 0834 11/05/15 0418  11/06/15 0323 11/07/15 0725 11/08/15 0746  NA 142 144 146* 140 141  K 3.6 3.1* 4.0 3.1* 4.9  CL 106 111 113* 109 111  CO2 28 25 22 24 25   GLUCOSE 96 109* 104* 136* 99  BUN 32* 30* 31* 31* 28*  CREATININE 1.30* 1.12 1.36* 1.05 1.12  CALCIUM  7.6* 7.7* 8.3* 8.2* 8.0*  MG  --   --   --  1.9  --    GFR: Estimated Creatinine Clearance: 36.6 mL/min (by C-G formula based on SCr of 1.12 mg/dL). Liver Function Tests:  Recent Labs Lab 11/03/15 1430  AST 66*  ALT 42  ALKPHOS 137*  BILITOT 1.2  PROT 6.1*  ALBUMIN 3.2*    Recent Labs Lab 11/03/15 1430  LIPASE 30   No results for input(s): AMMONIA in the last 168 hours. Coagulation Profile: No results for input(s): INR, PROTIME in the last 168 hours. Cardiac Enzymes: No results for input(s): CKTOTAL, CKMB, CKMBINDEX, TROPONINI in the last 168 hours. BNP (last 3 results) No results for input(s): PROBNP in the last 8760 hours. HbA1C: No results for input(s): HGBA1C in the last 72 hours. CBG: No results for input(s): GLUCAP in the last 168 hours. Lipid Profile: No results for input(s): CHOL, HDL, LDLCALC, TRIG, CHOLHDL, LDLDIRECT in the last 72 hours. Thyroid Function Tests:  Recent Labs  11/07/15 0707 11/07/15 0725  TSH 4.448  --   FREET4  --  0.90   Anemia Panel: No results for input(s): VITAMINB12, FOLATE, FERRITIN, TIBC, IRON, RETICCTPCT in the last 72 hours. Urine analysis:    Component Value Date/Time   COLORURINE YELLOW 11/03/2015 [place] 11/03/2015 1420   LABSPEC 1.038 (H) 11/03/2015 1420   PHURINE 6.0 11/03/2015 1420   GLUCOSEU NEGATIVE 11/03/2015 1420   HGBUR NEGATIVE 11/03/2015 1420   BILIRUBINUR NEGATIVE 11/03/2015 1420   BILIRUBINUR neg 08/18/2013 0848   KETONESUR NEGATIVE 11/03/2015 1420   PROTEINUR NEGATIVE 11/03/2015 1420   UROBILINOGEN 0.2 11/30/2013 0804   NITRITE NEGATIVE 11/03/2015 1420   LEUKOCYTESUR NEGATIVE 11/03/2015 1420   Sepsis  Labs: @LABRCNTIP (procalcitonin:4,lacticidven:4) ) Recent Results (from the past 240 hour(s))  MRSA PCR Screening     Status: None   Collection Time: 11/03/15  9:50 PM  Result Value Ref Range Status   MRSA by PCR NEGATIVE NEGATIVE Final    Comment:        The GeneXpert MRSA Assay (FDA approved for NASAL specimens only), is one component of a comprehensive MRSA colonization surveillance program. It is not intended to diagnose MRSA infection nor to guide or monitor treatment for MRSA infections.          Radiology Studies: Dg Chest Port 1 View  Result Date: 11/08/2015 CLINICAL DATA:  Hypertension.  Recent bowel intussusception EXAM: PORTABLE CHEST 1 VIEW COMPARISON:  November 07, 2015 FINDINGS: Nasogastric tube has been removed. There is patchy atelectasis in the right base region. Calcification on the right medially is likely due to prior emphysema, stable. No new opacity identified. Heart is upper normal in size with pulmonary vascularity within normal limits. There is atherosclerotic calcification aorta. Bones are osteoporotic. There is superior migration of each humeral head. IMPRESSION: Probable prior empyema on the right medially with calcification. Right base atelectasis. Lungs elsewhere clear. Stable cardiac silhouette. There is aortic atherosclerosis. There is evidence of bilateral chronic rotator cuff tears. Electronically Signed   By: Lowella Grip III M.D.   On: 11/08/2015 09:31   Dg Chest Port 1 View  Result Date: 11/07/2015 CLINICAL DATA:  Nasogastric tube. EXAM: PORTABLE CHEST 1 VIEW COMPARISON:  11/04/2015. FINDINGS: NG tube noted coiled stomach. Cardiomegaly with pulmonary vascular prominence interstitial prominence. No focal infiltrate. Mild bibasilar atelectasis. No pleural effusion or pneumothorax. IMPRESSION: 1. NG tube noted coiled stomach. 2. Cardiomegaly with pulmonary interstitial prominence consistent with congestive heart  failure and interstitial edema. Small  left pleural effusion. 3. Low lung volumes with bibasilar atelectasis . Electronically Signed   By: Marcello Moores  Register   On: 11/07/2015 09:00   Dg Abd Portable 1v  Result Date: 11/08/2015 CLINICAL DATA:  NG tube placement EXAM: PORTABLE ABDOMEN - 1 VIEW COMPARISON:  November 08, 2015 FINDINGS: The NG tube terminates in the distal esophagus. Recommend advancement. Persistent small bowel obstruction. Evaluation for free air is limited on this study but none is definitely seen. Pleural calcifications again identified. Minimal opacity in the lateral right lung is not completely excluded. No other acute abnormalities. IMPRESSION: 1. The NG tube side port is in the midesophagus and the distal tip is above the GE junction. Recommend advancement. 2. Persistent small bowel obstruction. 3. Calcified pleural plaques persists. These results will be called to the ordering clinician or representative by the Radiologist Assistant, and communication documented in the PACS or zVision Dashboard. Electronically Signed   By: Dorise Bullion III M.D   On: 11/08/2015 12:59   Dg Abd Portable 1v  Result Date: 11/08/2015 CLINICAL DATA:  Small-bowel obstruction EXAM: PORTABLE ABDOMEN - 1 VIEW COMPARISON:  11/07/2015 FINDINGS: There is again noted small-bowel obstruction similar to that seen on the prior exam. Nasogastric catheter is not well appreciated on this exam. No definitive free air is seen. Left hip replacement and degenerative change of the lumbar spine are again noted. IMPRESSION: Persistent small bowel obstruction Electronically Signed   By: Inez Catalina M.D.   On: 11/08/2015 09:35   Dg Abd Portable 1v  Result Date: 11/07/2015 CLINICAL DATA:  Abdominal distention. EXAM: PORTABLE ABDOMEN - 1 VIEW COMPARISON:  CT 11/06/2015.  Abdominal series 813 2017, 11/05/2015 FINDINGS: NG tube noted coiled in the stomach. Progressive distention of multiple loops of small bowel are noted. Paucity of colonic gas noted. No pneumatosis  noted. No free air identified. Severe degenerative changes and scoliosis lumbar spine. Degenerative changes right hip. Left hip prosthesis. Diffuse osteopenia. Surgical sutures left groin. IMPRESSION: 1. NG tube noted coiled stomach. 2. Progressive distention of multiple loops of small bowel. Small bowel loops are noted to be dilated up to 4.9 cm. Findings consistent with small-bowel obstruction. Electronically Signed   By: Marcello Moores  Register   On: 11/07/2015 06:43      Scheduled Meds: . ampicillin-sulbactam (UNASYN) IV  3 g Intravenous Q6H  . antiseptic oral rinse  7 mL Mouth Rinse q12n4p  . chlorhexidine  15 mL Mouth Rinse BID  . enoxaparin (LOVENOX) injection  40 mg Subcutaneous Q24H  . famotidine (PEPCID) IV  20 mg Intravenous Q24H  . metoprolol  5 mg Intravenous Q6H  . sodium chloride  1,000 mL Intravenous Once   Continuous Infusions: . diltiazem (CARDIZEM) infusion 2.5 mg/hr (11/08/15 0606)  . sodium chloride 0.45 % with kcl 125 mL/hr at 11/08/15 0029     LOS: 5 days    Time spent in minutes: 57    Tye, MD [facility] Pager: [email or web address] Password [email or web address] 11/08/2015, 1:29 PM

## 2015-11-08 NOTE — Progress Notes (Signed)
Nutrition Follow-up  DOCUMENTATION CODES:   Not applicable  INTERVENTION:  - Diet advancement as medically feasible.  - RD will continue to monitor for needs related to medical course.   NUTRITION DIAGNOSIS:     related to inability to eat as evidenced by NPO status. -ongoing  GOAL:   Patient will meet greater than or equal to 90% of their needs -unmet.   MONITOR:   Diet advancement, Weight trends, Labs, I & O's  ASSESSMENT:   82 y.o. male with medical history significant of cardiomyopathy, lung lobectomy in 1980s, Barretts esophagus, vertebral compression fracture who developed sudden onset abdominal pain and vomited once while at [facility] today around 11 pm. Pt then had one episode of emesis. Currently not in abdominal pain and has had no further vomiting.  Pt found to have SBO and NGT placed to suction.  8/15 Pt continues to be NPO since admission (5 days). NGT was adjusted yesterday and 75cc drainage obtained after adjustment. Per notes, pt pulled NGT this AM; spoke with sitter who is at bedside who reports NGT was replaced today ~1100. At time of RD visit canister with ~150cc yellow, clear drainage.   No family/visitors present at this time. Pt is confused at time of visit. Notes from early this AM indicate pt was agitated and aggressive for a short time. Did not perform physical assessment at this time with respect to pt's comfort but will attempt at follow-up.   Last BM: 8/14. Surgery note from this AM states that pt may require surgery in the future. Will continue to monitor for POC/GOC and medical course.   Medications reviewed; PRN Zofran, 10 mEq IV KCl x3 runs yesterday. Labs reviewed; BUN: 28 mg/dL, Ca: 8 mg/dL, GFR: 55 mg/dL. IVF: 1/2 NS-10 mEq KCl @ 125 mL/hr.   8/11 - NGT in place to suction with 300cc tan-ish brown drainage present at this time.  - Pt sleeping at this time and no family/visitors are currently present.  - Per Dr. Pollie Friar note, SBO seems to be  improving.  - Unable to do physical assessment with respect to pt's comfort at this time but did note mild to moderate muscle wasting to temples, clavicles, and shoulders. -  - Per chart review, pt has lost 20 lbs (13% body weight) in the past 11 months which is not significant for time frame. - He has also lost 5 lbs (3.5% body weight) in the past 5 months which is also not significant for time frame.   IVF: NS @ 100 mL/hr.    Diet Order:  Diet NPO time specified  Skin:  Reviewed, no issues  Last BM:  8/14  Height:   Ht Readings from Last 1 Encounters:  11/03/15 5\' 5"  (1.651 m)    Weight:   Wt Readings from Last 1 Encounters:  11/06/15 142 lb 13.7 oz (64.8 kg)    Ideal Body Weight:  61.82 kg  BMI:  Body mass index is 23.77 kg/m.  Estimated Nutritional Needs:   Kcal:  1300-1500  Protein:  60-70 grams  Fluid:  >/= 1.5 L/day  EDUCATION NEEDS:   No education needs identified at this time    Jarome Matin, MS, RD, LDN Inpatient Clinical Dietitian Pager # [phone] After hours/weekend pager # [phone]

## 2015-11-08 NOTE — Evaluation (Signed)
SLP Cancellation Note  Patient Details Name: [name] MRN: [id number] DOB: 06/24/1921   Cancelled treatment:       Reason Eval/Treat Not Completed: Other (comment) (rn reports swallow evaluation to be cancelled, md please reorder if indicated)   Luanna Salk, [facility] SLP [phone]

## 2015-11-09 ENCOUNTER — Inpatient Hospital Stay (HOSPITAL_COMMUNITY): Payer: Medicare Other

## 2015-11-09 ENCOUNTER — Inpatient Hospital Stay (HOSPITAL_COMMUNITY): Payer: Medicare Other | Admitting: Anesthesiology

## 2015-11-09 ENCOUNTER — Encounter (HOSPITAL_COMMUNITY)
Admission: EM | Disposition: A | Discharge status: Skilled Nursing Facility | Payer: Self-pay | Source: Home / Self Care | Attending: Internal Medicine

## 2015-11-09 ENCOUNTER — Encounter (HOSPITAL_COMMUNITY): Payer: Self-pay | Admitting: Student

## 2015-11-09 DIAGNOSIS — I10 Essential (primary) hypertension: Secondary | ICD-10-CM

## 2015-11-09 DIAGNOSIS — I5042 Chronic combined systolic (congestive) and diastolic (congestive) heart failure: Secondary | ICD-10-CM

## 2015-11-09 DIAGNOSIS — T182XXA Foreign body in stomach, initial encounter: Secondary | ICD-10-CM

## 2015-11-09 HISTORY — PX: LAPAROTOMY: SHX154

## 2015-11-09 HISTORY — PX: OTHER SURGICAL HISTORY: SHX169

## 2015-11-09 HISTORY — DX: Foreign body in stomach, initial encounter: T18.2XXA

## 2015-11-09 LAB — CBC
HCT: 37 % — ABNORMAL LOW (ref 39.0–52.0)
Hemoglobin: 12.4 g/dL — ABNORMAL LOW (ref 13.0–17.0)
MCH: 29.9 pg (ref 26.0–34.0)
MCHC: 33.5 g/dL (ref 30.0–36.0)
MCV: 89.2 fL (ref 78.0–100.0)
Platelets: 262 10*3/uL (ref 150–400)
RBC: 4.15 MIL/uL — ABNORMAL LOW (ref 4.22–5.81)
RDW: 14.6 % (ref 11.5–15.5)
WBC: 14.5 10*3/uL — AB (ref 4.0–10.5)

## 2015-11-09 LAB — BLOOD GAS, ARTERIAL
Acid-base deficit: 9 mmol/L — ABNORMAL HIGH (ref 0.0–2.0)
BICARBONATE: 15.4 meq/L — AB (ref 20.0–24.0)
Drawn by: 308601
FIO2: 60
LHR: 18 {breaths}/min
O2 Saturation: 95 %
PEEP/CPAP: 5 cmH2O
PO2 ART: 87.9 mmHg (ref 80.0–100.0)
Patient temperature: 98.6
TCO2: 14 mmol/L (ref 0–100)
VT: 490 mL
pCO2 arterial: 29.8 mmHg — ABNORMAL LOW (ref 35.0–45.0)
pH, Arterial: 7.332 — ABNORMAL LOW (ref 7.350–7.450)

## 2015-11-09 LAB — BASIC METABOLIC PANEL
ANION GAP: 6 (ref 5–15)
Anion gap: 6 (ref 5–15)
BUN: 23 mg/dL — AB (ref 6–20)
BUN: 25 mg/dL — AB (ref 6–20)
CALCIUM: 8.1 mg/dL — AB (ref 8.9–10.3)
CHLORIDE: 118 mmol/L — AB (ref 101–111)
CO2: 23 mmol/L (ref 22–32)
CO2: 16 mmol/L — AB (ref 22–32)
Calcium: 7.2 mg/dL — ABNORMAL LOW (ref 8.9–10.3)
Chloride: 111 mmol/L (ref 101–111)
Creatinine, Ser: 0.96 mg/dL (ref 0.61–1.24)
Creatinine, Ser: 0.98 mg/dL (ref 0.61–1.24)
GFR calc Af Amer: >60 mL/min (ref >60)
GLUCOSE: 89 mg/dL (ref 65–99)
GLUCOSE: 122 mg/dL — AB (ref 65–99)
POTASSIUM: 4.6 mmol/L (ref 3.5–5.1)
Potassium: 5.2 mmol/L — ABNORMAL HIGH (ref 3.5–5.1)
Sodium: 140 mmol/L (ref 135–145)
Sodium: 140 mmol/L (ref 135–145)

## 2015-11-09 LAB — LACTIC ACID, PLASMA
LACTIC ACID, VENOUS: 0.8 mmol/L (ref 0.5–1.9)
Lactic Acid, Venous: 0.7 mmol/L (ref 0.5–1.9)

## 2015-11-09 LAB — TYPE AND SCREEN
ABO/RH(D): A POS
ANTIBODY SCREEN: NEGATIVE

## 2015-11-09 LAB — TROPONIN I: TROPONIN I: 0.15 ng/mL — AB (ref <0.03)

## 2015-11-09 LAB — TRIGLYCERIDES: TRIGLYCERIDES: 123 mg/dL (ref <150)

## 2015-11-09 SURGERY — LAPAROTOMY, EXPLORATORY
Anesthesia: General

## 2015-11-09 MED ORDER — FENTANYL CITRATE (PF) 250 MCG/5ML IJ SOLN
INTRAMUSCULAR | Status: AC
  Filled 2015-11-09: qty 5

## 2015-11-09 MED ORDER — PROMETHAZINE HCL 25 MG/ML IJ SOLN: 6.2500 mg | INTRAMUSCULAR | Status: DC | PRN

## 2015-11-09 MED ORDER — FAMOTIDINE IN NACL 20-0.9 MG/50ML-% IV SOLN
20.0000 mg | Freq: Two times a day (BID) | INTRAVENOUS | Status: DC
  Administered 2015-11-09 – 2015-11-10 (×2): 20 mg via INTRAVENOUS
  Filled 2015-11-09 (×2): qty 50

## 2015-11-09 MED ORDER — LIDOCAINE HCL (CARDIAC) 20 MG/ML IV SOLN
INTRAVENOUS | Status: AC
  Filled 2015-11-09: qty 5

## 2015-11-09 MED ORDER — PHENYLEPHRINE HCL 10 MG/ML IJ SOLN
INTRAMUSCULAR | Status: DC | PRN
  Administered 2015-11-09: 80 ug via INTRAVENOUS

## 2015-11-09 MED ORDER — LIDOCAINE HCL (CARDIAC) 20 MG/ML IV SOLN
INTRAVENOUS | Status: DC | PRN
  Administered 2015-11-09: 100 mg via INTRAVENOUS

## 2015-11-09 MED ORDER — KCL IN DEXTROSE-NACL 20-5-0.45 MEQ/L-%-% IV SOLN
INTRAVENOUS | Status: DC
  Administered 2015-11-09 – 2015-11-10 (×3): via INTRAVENOUS
  Filled 2015-11-09 (×2): qty 1000

## 2015-11-09 MED ORDER — 0.9 % SODIUM CHLORIDE (POUR BTL) OPTIME
TOPICAL | Status: DC | PRN
  Administered 2015-11-09: 2000 mL

## 2015-11-09 MED ORDER — PROPOFOL 500 MG/50ML IV EMUL
INTRAVENOUS | Status: DC | PRN
  Administered 2015-11-09: 25 ug/kg/min via INTRAVENOUS

## 2015-11-09 MED ORDER — FENTANYL CITRATE (PF) 100 MCG/2ML IJ SOLN: 50.0000 ug | INTRAMUSCULAR | Status: DC | PRN

## 2015-11-09 MED ORDER — ETOMIDATE 2 MG/ML IV SOLN
INTRAVENOUS | Status: AC
  Filled 2015-11-09: qty 10

## 2015-11-09 MED ORDER — CHLORHEXIDINE GLUCONATE 0.12% ORAL RINSE (MEDLINE KIT)
15.0000 mL | Freq: Two times a day (BID) | OROMUCOSAL | Status: DC
  Administered 2015-11-09 – 2015-11-11 (×4): 15 mL via OROMUCOSAL

## 2015-11-09 MED ORDER — ONDANSETRON HCL 4 MG/2ML IJ SOLN: 4.0000 mg | Freq: Four times a day (QID) | INTRAMUSCULAR | Status: DC | PRN

## 2015-11-09 MED ORDER — SODIUM CHLORIDE 0.9 % IV SOLN: 250.0000 mL | INTRAVENOUS | Status: DC | PRN

## 2015-11-09 MED ORDER — BUPIVACAINE-EPINEPHRINE 0.25% -1:200000 IJ SOLN
INTRAMUSCULAR | Status: AC
  Filled 2015-11-09: qty 1

## 2015-11-09 MED ORDER — HYDROMORPHONE HCL 1 MG/ML IJ SOLN: 0.2500 mg | INTRAMUSCULAR | Status: DC | PRN

## 2015-11-09 MED ORDER — PROPOFOL 10 MG/ML IV BOLUS
INTRAVENOUS | Status: AC
  Filled 2015-11-09: qty 20

## 2015-11-09 MED ORDER — ROCURONIUM BROMIDE 100 MG/10ML IV SOLN
INTRAVENOUS | Status: AC
  Filled 2015-11-09: qty 1

## 2015-11-09 MED ORDER — SODIUM CHLORIDE 0.9 % IJ SOLN
INTRAMUSCULAR | Status: AC
  Filled 2015-11-09: qty 50

## 2015-11-09 MED ORDER — ASPIRIN 300 MG RE SUPP
300.0000 mg | RECTAL | Status: AC
  Administered 2015-11-09: 300 mg via RECTAL
  Filled 2015-11-09: qty 1

## 2015-11-09 MED ORDER — MIDAZOLAM HCL 2 MG/2ML IJ SOLN
1.0000 mg | INTRAMUSCULAR | Status: AC | PRN
  Administered 2015-11-09 – 2015-11-10 (×3): 1 mg via INTRAVENOUS
  Filled 2015-11-09 (×2): qty 2

## 2015-11-09 MED ORDER — ENOXAPARIN SODIUM 30 MG/0.3ML ~~LOC~~ SOLN
30.0000 mg | SUBCUTANEOUS | Status: DC
  Administered 2015-11-10 – 2015-11-15 (×6): 30 mg via SUBCUTANEOUS
  Filled 2015-11-09 (×6): qty 0.3

## 2015-11-09 MED ORDER — SODIUM CHLORIDE 0.45 % IV SOLN
INTRAVENOUS | Status: DC
Start: 2015-11-09 — End: 2015-11-09
  Administered 2015-11-09: 08:00:00 via INTRAVENOUS

## 2015-11-09 MED ORDER — ETOMIDATE 2 MG/ML IV SOLN
INTRAVENOUS | Status: DC | PRN
  Administered 2015-11-09: 14 mg via INTRAVENOUS
  Administered 2015-11-09: 6 mg via INTRAVENOUS

## 2015-11-09 MED ORDER — SODIUM CHLORIDE 0.9 % IV SOLN
INTRAVENOUS | Status: DC | PRN
  Administered 2015-11-09 (×2): via INTRAVENOUS

## 2015-11-09 MED ORDER — PROPOFOL 1000 MG/100ML IV EMUL
5.0000 ug/kg/min | INTRAVENOUS | Status: DC
  Filled 2015-11-09: qty 100

## 2015-11-09 MED ORDER — FENTANYL CITRATE (PF) 100 MCG/2ML IJ SOLN
50.0000 ug | INTRAMUSCULAR | Status: DC | PRN
  Administered 2015-11-09 – 2015-11-10 (×4): 50 ug via INTRAVENOUS
  Filled 2015-11-09 (×4): qty 2

## 2015-11-09 MED ORDER — BUPIVACAINE LIPOSOME 1.3 % IJ SUSP
20.0000 mL | Freq: Once | INTRAMUSCULAR | Status: AC
  Administered 2015-11-09: 20 mL
  Filled 2015-11-09: qty 20

## 2015-11-09 MED ORDER — SODIUM CHLORIDE 0.9 % IV SOLN: INTRAVENOUS | Status: DC

## 2015-11-09 MED ORDER — FENTANYL CITRATE (PF) 100 MCG/2ML IJ SOLN
50.0000 ug | INTRAMUSCULAR | Status: DC | PRN
  Administered 2015-11-09: 50 ug via INTRAVENOUS
  Filled 2015-11-09: qty 2

## 2015-11-09 MED ORDER — FENTANYL CITRATE (PF) 100 MCG/2ML IJ SOLN
INTRAMUSCULAR | Status: DC | PRN
  Administered 2015-11-09: 50 ug via INTRAVENOUS
  Administered 2015-11-09: 100 ug via INTRAVENOUS

## 2015-11-09 MED ORDER — PHENYLEPHRINE HCL 10 MG/ML IJ SOLN
30.0000 ug/min | INTRAMUSCULAR | Status: DC
  Administered 2015-11-09: 40 ug/min via INTRAVENOUS
  Filled 2015-11-09: qty 1

## 2015-11-09 MED ORDER — ASPIRIN 81 MG PO CHEW: 324.0000 mg | CHEWABLE_TABLET | ORAL | Status: AC

## 2015-11-09 MED ORDER — PROPOFOL 1000 MG/100ML IV EMUL
5.0000 ug/kg/min | INTRAVENOUS | Status: DC
  Administered 2015-11-09: 33 ug/kg/min via INTRAVENOUS
  Filled 2015-11-09: qty 100

## 2015-11-09 MED ORDER — ALBUMIN HUMAN 5 % IV SOLN
INTRAVENOUS | Status: AC
  Filled 2015-11-09: qty 500

## 2015-11-09 MED ORDER — PHENYLEPHRINE HCL 10 MG/ML IJ SOLN
INTRAMUSCULAR | Status: DC | PRN
  Administered 2015-11-09: 25 ug/min via INTRAVENOUS

## 2015-11-09 MED ORDER — ANTISEPTIC ORAL RINSE SOLUTION (CORINZ)
7.0000 mL | Freq: Four times a day (QID) | OROMUCOSAL | Status: DC
  Administered 2015-11-09 – 2015-11-11 (×6): 7 mL via OROMUCOSAL

## 2015-11-09 MED ORDER — SUCCINYLCHOLINE CHLORIDE 20 MG/ML IJ SOLN
INTRAMUSCULAR | Status: DC | PRN
  Administered 2015-11-09: 100 mg via INTRAVENOUS

## 2015-11-09 MED ORDER — MIDAZOLAM HCL 2 MG/2ML IJ SOLN
1.0000 mg | INTRAMUSCULAR | Status: DC | PRN
  Administered 2015-11-10 (×2): 1 mg via INTRAVENOUS
  Filled 2015-11-09 (×3): qty 2

## 2015-11-09 MED ORDER — ROCURONIUM BROMIDE 100 MG/10ML IV SOLN
INTRAVENOUS | Status: DC | PRN
  Administered 2015-11-09 (×2): 10 mg via INTRAVENOUS
  Administered 2015-11-09: 30 mg via INTRAVENOUS
  Administered 2015-11-09: 10 mg via INTRAVENOUS

## 2015-11-09 SURGICAL SUPPLY — 42 items
APPLICATOR COTTON TIP 6IN STRL (MISCELLANEOUS) ×3 IMPLANT
BLADE EXTENDED COATED 6.5IN (ELECTRODE) IMPLANT
BLADE HEX COATED 2.75 (ELECTRODE) ×3 IMPLANT
CELLS DAT CNTRL 66122 CELL SVR (MISCELLANEOUS) ×1 IMPLANT
COVER MAYO STAND STRL (DRAPES) ×3 IMPLANT
COVER SURGICAL LIGHT HANDLE (MISCELLANEOUS) IMPLANT
DRAIN CHANNEL 19F RND (DRAIN) IMPLANT
DRAPE LAPAROSCOPIC ABDOMINAL (DRAPES) ×3 IMPLANT
DRAPE WARM FLUID 44X44 (DRAPE) ×3 IMPLANT
DRSG OPSITE POSTOP 4X6 (GAUZE/BANDAGES/DRESSINGS) ×3 IMPLANT
ELECT REM PT RETURN 9FT ADLT (ELECTROSURGICAL) ×3
ELECTRODE REM PT RTRN 9FT ADLT (ELECTROSURGICAL) ×1 IMPLANT
EVACUATOR SILICONE 100CC (DRAIN) IMPLANT
GAUZE SPONGE 4X4 12PLY STRL (GAUZE/BANDAGES/DRESSINGS) ×3 IMPLANT
GLOVE BIOGEL M 8.0 STRL (GLOVE) ×9 IMPLANT
GOWN SPEC L4 XLG W/TWL (GOWN DISPOSABLE) ×3 IMPLANT
GOWN STRL REUS W/TWL XL LVL3 (GOWN DISPOSABLE) ×9 IMPLANT
HANDLE SUCTION POOLE (INSTRUMENTS) ×2 IMPLANT
KIT BASIN OR (CUSTOM PROCEDURE TRAY) ×3 IMPLANT
LIGASURE IMPACT 36 18CM CVD LR (INSTRUMENTS) IMPLANT
NS IRRIG 1000ML POUR BTL (IV SOLUTION) ×6 IMPLANT
PACK GENERAL/GYN (CUSTOM PROCEDURE TRAY) ×3 IMPLANT
RTRCTR WOUND ALEXIS 18CM MED (MISCELLANEOUS) ×3
SPONGE LAP 18X18 X RAY DECT (DISPOSABLE) ×6 IMPLANT
STAPLER VISISTAT 35W (STAPLE) IMPLANT
SUCTION POOLE HANDLE (INSTRUMENTS) ×6
SUT PDS AB 1 CTX 36 (SUTURE) IMPLANT
SUT PDS AB 4-0 PS2 18 (SUTURE) ×6 IMPLANT
SUT PDS AB 4-0 RB1 27 (SUTURE) ×6 IMPLANT
SUT SILK 2 0 (SUTURE) ×2
SUT SILK 2 0 SH CR/8 (SUTURE) ×6 IMPLANT
SUT SILK 2-0 18XBRD TIE 12 (SUTURE) ×1 IMPLANT
SUT SILK 3 0 (SUTURE) ×2
SUT SILK 3 0 SH CR/8 (SUTURE) ×3 IMPLANT
SUT SILK 3-0 18XBRD TIE 12 (SUTURE) ×1 IMPLANT
SUT VIC AB 3-0 SH 18 (SUTURE) ×6 IMPLANT
SUT VICRYL 2 0 18  UND BR (SUTURE)
SUT VICRYL 2 0 18 UND BR (SUTURE) IMPLANT
TOWEL OR 17X26 10 PK STRL BLUE (TOWEL DISPOSABLE) ×3 IMPLANT
TOWEL OR NON WOVEN STRL DISP B (DISPOSABLE) ×3 IMPLANT
TRAY FOLEY W/METER SILVER 14FR (SET/KITS/TRAYS/PACK) IMPLANT
TRAY FOLEY W/METER SILVER 16FR (SET/KITS/TRAYS/PACK) ×3 IMPLANT

## 2015-11-09 NOTE — Consult Note (Signed)
PULMONARY / CRITICAL CARE MEDICINE   Name: [name] MRN: [id number] DOB: October 20, 1921    ADMISSION DATE:  11/03/2015 CONSULTATION DATE:  8/16  REFERRING MD:  Dr. Tyrell Antonio  CHIEF COMPLAINT:  VDRF post op  HISTORY OF PRESENT ILLNESS:   82 year old male with PMH as below, which is significant for systolic/diastolic CHF, NICM, and Barrett's esophagus. He was admitted 8/10 for small bowel obstruction after an episode of vomiting at the [facility]. CT abdomen demonstrated intussusception vs obstruction of distal ileum. Also some concern for aspiration PNA started on unasyn. SBO was initially treated conservatively with decompression via NGT. Hospital course complicated by AF RVR requiring diltiazem infusion. 8/16 he was taken to OR for ex-lap in setting SBO and bezoar was removed from terminal ileum. Postoperatively he was taken to ICU on ventilator.   PAST MEDICAL HISTORY :  He  has a past medical history of BARRETTS ESOPHAGUS (11/19/2007); Blood transfusion without reported diagnosis; CHANGE IN BOWELS (02/01/2009); Chronic combined systolic (congestive) and diastolic (congestive) heart failure ([place]); Diarrhea (01/31/2009); DIVERTICULOSIS, COLON (11/19/2007); GASTRITIS (11/19/2007); GERD (11/19/2007); HIATAL HERNIA (11/19/2007); HTN (hypertension); HYPERLIPIDEMIA (06/27/2009); HYPOTHYROIDISM (06/27/2009); IBS (irritable bowel syndrome); MELENA (08/01/2009); Nonischemic cardiomyopathy ([place]); PERSONAL HX COLONIC POLYPS (02/01/2009); TINEA CORPORIS (06/27/2009); TOBACCO USE, QUIT (06/27/2009); and TREMOR, ESSENTIAL (06/27/2009).  PAST SURGICAL HISTORY: He  has a past surgical history that includes Cholecystectomy; Lung surgery (1984); Umbilical hernia repair; Tonsillectomy; Vasectomy; Esophagogastroduodenoscopy (N/A, 04/28/2013); Tonsillectomy; Hernia repair; and Hip Arthroplasty (Left, 11/30/2013).  Allergies  Allergen Reactions  . Ciprofloxacin Other (See Comments)    Reaction:  Unknown   . Penicillins Rash and Other  (See Comments)    Has patient had a PCN reaction causing immediate rash, facial/tongue/throat swelling, SOB or lightheadedness with hypotension: No Has patient had a PCN reaction causing severe rash involving mucus membranes or skin necrosis: No Has patient had a PCN reaction that required hospitalization No Has patient had a PCN reaction occurring within the last 10 years: No If all of the above answers are "NO", then may proceed with Cephalosporin use.    No current facility-administered medications on file prior to encounter.    No current outpatient prescriptions on file prior to encounter.    FAMILY HISTORY:  His indicated that his mother is deceased. He indicated that his father is deceased. He indicated that both of his sisters are alive. He indicated that his brother is deceased. He indicated that his maternal grandmother is deceased. He indicated that his maternal grandfather is deceased. He indicated that his paternal grandmother is deceased. He indicated that his paternal grandfather is deceased. He indicated that all of his four sons are alive.    SOCIAL HISTORY: He  reports that he quit smoking about 72 years ago. His smoking use included Cigarettes. He has a 72.00 pack-year smoking history. He has never used smokeless tobacco. He reports that he drinks about 0.6 oz of alcohol per week . He reports that he does not use drugs.  REVIEW OF SYSTEMS:   Unable as patient is intubated and encephalopathic  SUBJECTIVE:    VITAL SIGNS: BP 135/73   Pulse 78   Temp 98.6 F (37 C) (Axillary)   Resp (!) 23   Ht 5\' 5"  (1.651 m)   Wt 64.8 kg (142 lb 13.7 oz)   SpO2 96%   BMI 23.77 kg/m   HEMODYNAMICS:    VENTILATOR SETTINGS: Vent Mode: PRVC FiO2 (%):  [100 %] 100 % Set Rate:  [18 bmp]  18 bmp Vt Set:  [490 mL] 490 mL PEEP:  [5 cmH20] 5 cmH20 Plateau Pressure:  [22 cmH20] 22 cmH20  INTAKE / OUTPUT: I/O last 3 completed shifts: In: 4373.8 [I.V.:3773.8; IV  [id number] Out: 1225 [Urine:975; Emesis/NG output:250]  PHYSICAL EXAMINATION: General:  Elderly male in NAD on vent Neuro:  Sedated HEENT:  [place]/AT, PERRL, no JVD Cardiovascular:  RRR, no MRG Lungs:  Clear bilateral breath sounds Abdomen:  Soft, non-distedned. +BS Musculoskeletal:  No acute deformity Skin:  Grossly intact.  LABS:  BMET  Recent Labs Lab 11/07/15 0725 11/08/15 0746 11/09/15 0315  NA 140 141 140  K 3.1* 4.9 5.2*  CL 109 111 111  CO2 24 25 23   BUN 31* 28* 23*  CREATININE 1.05 1.12 0.96  GLUCOSE 136* 99 89    Electrolytes  Recent Labs Lab 11/07/15 0725 11/08/15 0746 11/09/15 0315  CALCIUM 8.2* 8.0* 8.1*  MG 1.9  --   --     CBC  Recent Labs Lab 11/06/15 0323 11/07/15 0725 11/09/15 0315  WBC 16.1* 16.4* 14.5*  HGB 12.3* 12.7* 12.4*  HCT 37.0* 37.3* 37.0*  PLT 229 245 262    Coag's No results for input(s): APTT, INR in the last 168 hours.  Sepsis Markers  Recent Labs Lab 11/04/15 0834  LATICACIDVEN 1.7    ABG No results for input(s): PHART, PCO2ART, PO2ART in the last 168 hours.  Liver Enzymes  Recent Labs Lab 11/03/15 1430  AST 66*  ALT 42  ALKPHOS 137*  BILITOT 1.2  ALBUMIN 3.2*    Cardiac Enzymes No results for input(s): TROPONINI, PROBNP in the last 168 hours.  Glucose No results for input(s): GLUCAP in the last 168 hours.  Imaging Dg Abd Portable 1v  Result Date: 11/09/2015 CLINICAL DATA:  Small bowel obstruction EXAM: PORTABLE ABDOMEN - 1 VIEW COMPARISON:  November 08, 2015 FINDINGS: Nasogastric tube tip and side port are in proximal stomach. There remain multiple loops of dilated small bowel in a pattern concerning for a degree of obstruction. No free air evident. There is evidence of prior calcified empyema is in the lung base regions. IMPRESSION: Bowel gas pattern remains of concern for a degree of bowel obstruction. No free air. Nasogastric tube tip and side port in proximal stomach. Electronically Signed    By: Lowella Grip III M.D.   On: 11/09/2015 07:01     STUDIES:  CT abd 8/13> Partial small bowel obstruction similar to the prior CT. What was felt be an ileal intussusception on the prior study now appears more consistent with 2 adjacent densities, consistent with stones or dense stool, in the distal ileum. These may or may not be the cause of the obstruction. Bowel distal to these densities is decompressed. There is currently no convincing intussusception. Minimal pleural effusions and dependent lung base atelectasis, increased from the prior study. Densely calcified pleural plaque at the lung bases is stable.  CULTURES:   ANTIBIOTICS: Unasyn 8/10>  SIGNIFICANT EVENTS: 8/10 admit 8/16 to OR bezoar removed ex lap.   LINES/TUBES: ETT 8/16 >>> ARt line 8/16 >>>  DISCUSSION: 82 year old male admitted for SBO and aspiration. Developed PAF inpatient. To OR 8/16 for ex-lap and had Bezoar removed. Post op on vent in ICU. Plan to support overnight and hopefully extubate in AM.   ASSESSMENT / PLAN:  PULMONARY A: Inability to protect airway in post-op setting Acute hypoxemic respiratory failure secondary to aspiration PNA (not the reason he is on vent)  P:  Full vent support Likely to extubate in AM CXR for ETT placement ABG Vent bundle  CARDIOVASCULAR A:  Shock in perioperative setting, likely hypovolemic (now off pressors) Paroxysmal atrial fibrillation (CHA2DS2-VASc Score 4), currently sinus with rate 66 Chronic systolic/diastolic CHF (LVEF newly 0000000 down from 45) Troponin elevation - no ischemic changes on EKG  P:  Telemetry DC diltiazem and home anti-hypertensive's Volume resuscitation Neo as needed to keep MAP > 40mmHg Cardiology following  RENAL A:   Hyperkalemia  P:   Repeat BMP  GASTROINTESTINAL A:   Bowel obstruction secondary to Bezoar, now s/p ex-lap and removal 8/16 Barrett's esophagus   P:   Management per surgery NPO Pepcid  BID  HEMATOLOGIC A:   No acute issues  P:  Repeat BMP  INFECTIOUS A:   Suspected aspiration PNA  P:   Continue Unasyn No cultures to follow  ENDOCRINE A:   No acute issues  P:   Follow glucose on chemistries  NEUROLOGIC A:   Acute encephalopathy  P:   RASS goal: -1 Propofol gtt  PRN fentanyl WUA in AM with hope for extubation.    FAMILY  - Updates: no family available  - Inter-disciplinary family meet or Palliative Care meeting due by:  8/24   Georgann Housekeeper, AGACNP-BC [facility] Pager [phone] or [phone]  11/09/2015 7:10 PM     ATTENDING NOTE / ATTESTATION NOTE :   I have discussed the case with the resident/APP  Georgann Housekeeper as well as hospitalist taking care of the patient prior to surgery.   I agree with the resident/APP's  history, physical examination, assessment, and plans.    I have edited the above note and modified it according to our agreed history, physical examination, assessment and plan.   Briefly, 82 year old male, apparently of good functional capacity, admitted with abdominal pain and vomiting. CT scan of the abdomen showed intussusception vs obstruction in the  distal ileum. Also some concern for aspiration PNA started on unasyn. SBO was initially treated conservatively with decompression via NGT. Hospital course complicated by AF RVR requiring diltiazem infusion. 8/16 he was taken to OR for ex-lap in setting SBO and bezoar was removed from terminal ileum. He was kept on the ventilator postoperatively. Became hypotensive with propofol. Blood pressure improved after stopping propofol. No issues overnight other than the ones mentioned. This morning, he is awake, follows command, comfortable. Physical exam : VSS. BP 120/60, HR 60, respiratory rate 18, sats of 100% on 40% FiO2. Afebrile. HEENT exam showed no neck vein distention. ET tube in place. Chest exam showed good air entry, some crackles at the bases. No  wheezing. Cardiac exam showed good S1 and S2. No murmurs appreciated. Abdomen was soft, diminished bowel sounds. No tenderness. Extremities revealed no edema. Warm extremities.  Laboratory results reviewed. Chest x-ray showed atelectasis at the bases. BMP reviewed. Creatinine normal. CBC reviewed. Elevated WBC at 17. Lactic acid was normal at 0.8. Cultures are pending.  Assessment and Plan : 1. Acute hypoxemic respiratory failure secondary to unable to protect airway with surgery. Concern for aspiration pneumonia. Patient's EF is 25% but there is no evidence of volume overload or pulmonary edema based on exam. Plan for pressure support trial today. Anticipate, we can extubate patient if he does well. We'll diurese a little bit as he is 10 L positive based on his intake and output. Check electrolytes.  2. Congestive heart failure, EF of 25%. Atrial fibrillation. Demand ischemia. As mentioned, there is no evidence  for significant volume overload despite being 10 L positive on intake and output. Diuresce gently. Check electrolytes. Keep potassium equals 4. Keep magnesium at least 2. 3. Aspiration pneumonitis vs PNA.  Cont Unasyn for now. Check pro calcitonin. Will deescalate once with more data.  4. Bowel obstruction secondary to Bezoar, now s/p ex-lap and removal 8/16. Barrett's esophagus. Keep nothing by mouth. Per surgery. 5. DVT prophylaxis. 6. PUD prophylaxis.   I have spent 35  minutes of critical care time with this patient today.  Family :Family updated at length today.  I spoke with his son, Matheo Amory and extensively discussed with them his overall condition and prognosis. Patient remains a full code. They will modify the CODE STATUS depending on patient's clinical condition.   Monica Becton, MD 11/10/2015, 8:34 AM [facility] Pager [phone] After 3 pm or if no answer, call [phone]

## 2015-11-09 NOTE — Progress Notes (Signed)
eLink Physician-Brief Progress Note Patient Name: DAWSON TWEED DOB: 31-May-1921 MRN: [id number]   Date of Service  11/09/2015  HPI/Events of Note  Pt back from OR on vent. S/P bezoar removal, small bowel obstruction.  On low dose neo drip with good BP.  100% FiO2, 5 PEEP, stable on camera check. Had paroxysmal A fib but is now sinus on tele  eICU Interventions  Check chest X ray, ABG Wean off neo Pulmonary consult to follow for vent management.     Intervention Category Evaluation Type: New Patient Evaluation  Robbert Langlinais 11/09/2015, 5:15 PM

## 2015-11-09 NOTE — Anesthesia Procedure Notes (Signed)
Procedure Name: Intubation Date/Time: 11/09/2015 2:44 PM Performed by: Montel Clock Pre-anesthesia Checklist: Patient identified, Emergency Drugs available, Suction available, Patient being monitored and Timeout performed Patient Re-evaluated:Patient Re-evaluated prior to inductionOxygen Delivery Method: Circle system utilized Preoxygenation: Pre-oxygenation with 100% oxygen Intubation Type: IV induction, Rapid sequence and Cricoid Pressure applied Laryngoscope Size: Mac and 3 Grade View: Grade I Tube type: Subglottic suction tube Tube size: 7.5 mm Number of attempts: 1 Airway Equipment and Method: Stylet Placement Confirmation: ETT inserted through vocal cords under direct vision,  positive ETCO2 and breath sounds checked- equal and bilateral Secured at: 23 cm Tube secured with: Tape Dental Injury: Teeth and Oropharynx as per pre-operative assessment

## 2015-11-09 NOTE — Progress Notes (Signed)
Called and notified Ruby, charge RN in ICU that patient would most likely return to ICU directly from the OR intubated and needing ventilator in room.  Thank you

## 2015-11-09 NOTE — Progress Notes (Signed)
PROGRESS NOTE    [name]  [id number] DOB: [birth year] DOA: 11/03/2015  PCP: Jenny Reichmann, MD   Brief Narrative:  [name] is a 82 y.o. male with medical history significant of cardiomyopathy, lung lobectomy in 1980s, Barretts esophagus, vertebral compression fracture who is quite independent at baseline and developed sudden onset abdominal pain and vomited once while at [employer] around 11 pm. No fever or chills.  Found to have intussusception of ileum. Also found to be hypoxic and thought to have aspirated while vomiting. While being in the hospital he has developed new onset paroxysmal  A-fib with RVR. HR has been difficult to control and is being managed with IV Cardizem in addition to IV Lopressor. Also found to have a new drop in EF from 45 to 25%.   Subjective: No complaints   Denies chest pain.  Still with NG tube, no Bowel movement   Assessment & Plan:   Principal Problem:   Small bowel obstruction  - due to intussusception vs obstruction in distal ileum -  surgery following- NG output had improved to < 300 in 24 hrs however, he pulled NG yesterday and this is now being replaced per surgery -no improvement. Discussed with son patient is at high risk for surgery, and complication. Will ask cardio to see family request.   For possible surgery today.  Active Problems: Acute hypoxic resp failure - ? Aspiration pneumonia- started on Unasyn in ER and eventually was weaned to room air-  repeat CXR showed bibasilar opacities with hypo expended lungs- - noted to be back on O2 today which apparently occurred sometime last night- has some congestion and wheeze- CXR does note show any change, no infiltrates and no pulm edema - may have aspirated again last night? - cont Unasyn and O2 - PRN nebs - Xopenex as he has A-fib  A-fib with RVR - no prior history of this - given IV Metoprolol- converting back and forth from NSR to A-fib with RVR - cont Cardizem infusion  -  ECHO ordered-EF 25-30%  checked thyroid studies which are normal  Systolic CHF - chronic - previously  EF 45 %, gr 1 d CHF - ECHO shows a new finding of EF of 25-30% with mild to mod right heart failure as well- mild Pulm HTN - follow I and O carefully to prevent fluid overload in acute setting - on B blocker at this time- will need ACE I / ARB as tolerated if he recovers from SBO -family would like to speak with cardiology   AKI- pleural effusions  - due to dehydration/ hypotension? - cont slow hydration and follow I and O- has developed small pleural effusions and therefore do not need to be aggressive with IVF  - Cr has improved to baseline.   Acute delirium - PRN Haldol and sitter- he does better when family is at bedside - he is routinely not agitated at home and as mentioned, is quite oriented and independent  Hypokalemia  replacing and following  Hypernatremia -resolved with changed IVF to lower sodium concentration  HTN - Lasix, Lisinopril/ HCTZ and Metoprolol on hold - IV Lopressor   Gastritis/ GERD? - PPI on hold as shortage of IV Protonix and unable to give oral PPI - Pepcid IV  Very hard of hearing - has appt for hearing aid on Friday   DVT prophylaxis: Lovenox Code Status: Full code Family Communication: son, Marya Amsler Disposition Plan: to be determined Consultants:   gen surgery  Procedures:    Antimicrobials:  Anti-infectives    Start     Dose/Rate Route Frequency Ordered Stop   11/07/15 1600  Ampicillin-Sulbactam (UNASYN) 3 g in sodium chloride 0.9 % 100 mL IVPB     3 g 100 mL/hr over 60 Minutes Intravenous Every 6 hours 11/07/15 1024     11/06/15 2200  Ampicillin-Sulbactam (UNASYN) 3 g in sodium chloride 0.9 % 100 mL IVPB  Status:  Discontinued     3 g 100 mL/hr over 60 Minutes Intravenous Every 12 hours 11/06/15 1020 11/07/15 1024   11/03/15 1930  Ampicillin-Sulbactam (UNASYN) 3 g in sodium chloride 0.9 % 100 mL IVPB  Status:  Discontinued     3  g 100 mL/hr over 60 Minutes Intravenous Every 6 hours 11/03/15 1848 11/06/15 1020   11/03/15 1600  cefTRIAXone (ROCEPHIN) 1 g in dextrose 5 % 50 mL IVPB     1 g 100 mL/hr over 30 Minutes Intravenous  Once 11/03/15 1558 11/03/15 1636   11/03/15 1600  azithromycin (ZITHROMAX) 500 mg in dextrose 5 % 250 mL IVPB     500 mg 250 mL/hr over 60 Minutes Intravenous  Once 11/03/15 1558 11/03/15 1858       Objective: Vitals:   11/09/15 0804 11/09/15 0900 11/09/15 1000 11/09/15 1001  BP:  (!) 118/100 139/86 128/83  Pulse:  95 (!) 124   Resp:  19 20 20   Temp: 97.3 F (36.3 C)     TempSrc: Oral     SpO2:  99% 100%   Weight:      Height:        Intake/Output Summary (Last 24 hours) at 11/09/15 1130 Last data filed at 11/09/15 1000  Gross per 24 hour  Intake          2897.33 ml  Output              925 ml  Net          1972.33 ml   Filed Weights   11/03/15 1930 11/05/15 0400 11/06/15 0500  Weight: 62.8 kg (138 lb 7.2 oz) 65.8 kg (145 lb 1 oz) 64.8 kg (142 lb 13.7 oz)    Examination: General exam: Appears comfortable  HEENT: PERRLA, oral mucosa moist, no sclera icterus or thrush Respiratory system: Clear to auscultation. Respiratory effort normal. Pulse ox 96 % on room air  Cardiovascular system: S1 & S2 heard, RRR.  No murmurs  Gastrointestinal system: Abdomen soft, non-tender, nondistended. Audible but decreased bowel sounds.    No organomegaly Central nervous system: Alert and oriented to place. No focal neurological deficits. Extremities: No cyanosis, clubbing or edema Skin: No rashes or ulcers Psychiatry:  Mood & affect appropriate.     Data Reviewed: I have personally reviewed following labs and imaging studies  CBC:  Recent Labs Lab 11/03/15 1430  11/04/15 0834 11/05/15 0418 11/06/15 0323 11/07/15 0725 11/09/15 0315  WBC 6.9  --  11.6* 13.7* 16.1* 16.4* 14.5*  NEUTROABS 5.2  --   --  11.8* 14.4*  --   --   HGB 12.8*  < > 12.6* 11.7* 12.3* 12.7* 12.4*  HCT  38.4*  < > 37.5* 34.6* 37.0* 37.3* 37.0*  MCV 90.6  --  89.9 90.1 91.4 89.7 89.2  PLT 216  --  213 205 229 245 262  < > = values in this interval not displayed. Basic Metabolic Panel:  Recent Labs Lab 11/05/15 0418 11/06/15 0323 11/07/15 0725 11/08/15 0746 11/09/15 0315  NA  144 146* 140 141 140  K 3.1* 4.0 3.1* 4.9 5.2*  CL 111 113* 109 111 111  CO2 25 22 24 25 23   GLUCOSE 109* 104* 136* 99 89  BUN 30* 31* 31* 28* 23*  CREATININE 1.12 1.36* 1.05 1.12 0.96  CALCIUM 7.7* 8.3* 8.2* 8.0* 8.1*  MG  --   --  1.9  --   --    GFR: Estimated Creatinine Clearance: 42.7 mL/min (by C-G formula based on SCr of 0.96 mg/dL). Liver Function Tests:  Recent Labs Lab 11/03/15 1430  AST 66*  ALT 42  ALKPHOS 137*  BILITOT 1.2  PROT 6.1*  ALBUMIN 3.2*    Recent Labs Lab 11/03/15 1430  LIPASE 30   No results for input(s): AMMONIA in the last 168 hours. Coagulation Profile: No results for input(s): INR, PROTIME in the last 168 hours. Cardiac Enzymes: No results for input(s): CKTOTAL, CKMB, CKMBINDEX, TROPONINI in the last 168 hours. BNP (last 3 results) No results for input(s): PROBNP in the last 8760 hours. HbA1C: No results for input(s): HGBA1C in the last 72 hours. CBG: No results for input(s): GLUCAP in the last 168 hours. Lipid Profile: No results for input(s): CHOL, HDL, LDLCALC, TRIG, CHOLHDL, LDLDIRECT in the last 72 hours. Thyroid Function Tests:  Recent Labs  11/07/15 0707 11/07/15 0725  TSH 4.448  --   FREET4  --  0.90   Anemia Panel: No results for input(s): VITAMINB12, FOLATE, FERRITIN, TIBC, IRON, RETICCTPCT in the last 72 hours. Urine analysis:    Component Value Date/Time   COLORURINE YELLOW 11/03/2015 [place] 11/03/2015 1420   LABSPEC 1.038 (H) 11/03/2015 1420   PHURINE 6.0 11/03/2015 1420   GLUCOSEU NEGATIVE 11/03/2015 1420   HGBUR NEGATIVE 11/03/2015 1420   BILIRUBINUR NEGATIVE 11/03/2015 1420   BILIRUBINUR neg 08/18/2013 0848    KETONESUR NEGATIVE 11/03/2015 1420   PROTEINUR NEGATIVE 11/03/2015 1420   UROBILINOGEN 0.2 11/30/2013 0804   NITRITE NEGATIVE 11/03/2015 1420   LEUKOCYTESUR NEGATIVE 11/03/2015 1420   Sepsis Labs: @LABRCNTIP (procalcitonin:4,lacticidven:4) ) Recent Results (from the past 240 hour(s))  MRSA PCR Screening     Status: None   Collection Time: 11/03/15  9:50 PM  Result Value Ref Range Status   MRSA by PCR NEGATIVE NEGATIVE Final    Comment:        The GeneXpert MRSA Assay (FDA approved for NASAL specimens only), is one component of a comprehensive MRSA colonization surveillance program. It is not intended to diagnose MRSA infection nor to guide or monitor treatment for MRSA infections.          Radiology Studies: Dg Chest Port 1 View  Result Date: 11/08/2015 CLINICAL DATA:  Hypertension.  Recent bowel intussusception EXAM: PORTABLE CHEST 1 VIEW COMPARISON:  November 07, 2015 FINDINGS: Nasogastric tube has been removed. There is patchy atelectasis in the right base region. Calcification on the right medially is likely due to prior emphysema, stable. No new opacity identified. Heart is upper normal in size with pulmonary vascularity within normal limits. There is atherosclerotic calcification aorta. Bones are osteoporotic. There is superior migration of each humeral head. IMPRESSION: Probable prior empyema on the right medially with calcification. Right base atelectasis. Lungs elsewhere clear. Stable cardiac silhouette. There is aortic atherosclerosis. There is evidence of bilateral chronic rotator cuff tears. Electronically Signed   By: Lowella Grip III M.D.   On: 11/08/2015 09:31   Dg Abd Portable 1v  Result Date: 11/09/2015 CLINICAL DATA:  Small bowel obstruction  EXAM: PORTABLE ABDOMEN - 1 VIEW COMPARISON:  November 08, 2015 FINDINGS: Nasogastric tube tip and side port are in proximal stomach. There remain multiple loops of dilated small bowel in a pattern concerning for a degree  of obstruction. No free air evident. There is evidence of prior calcified empyema is in the lung base regions. IMPRESSION: Bowel gas pattern remains of concern for a degree of bowel obstruction. No free air. Nasogastric tube tip and side port in proximal stomach. Electronically Signed   By: Lowella Grip III M.D.   On: 11/09/2015 07:01   Dg Abd Portable 1v  Result Date: 11/08/2015 CLINICAL DATA:  NG tube placement EXAM: PORTABLE ABDOMEN - 1 VIEW COMPARISON:  November 08, 2015 FINDINGS: The NG tube terminates in the distal esophagus. Recommend advancement. Persistent small bowel obstruction. Evaluation for free air is limited on this study but none is definitely seen. Pleural calcifications again identified. Minimal opacity in the lateral right lung is not completely excluded. No other acute abnormalities. IMPRESSION: 1. The NG tube side port is in the midesophagus and the distal tip is above the GE junction. Recommend advancement. 2. Persistent small bowel obstruction. 3. Calcified pleural plaques persists. These results will be called to the ordering clinician or representative by the Radiologist Assistant, and communication documented in the PACS or zVision Dashboard. Electronically Signed   By: Dorise Bullion III M.D   On: 11/08/2015 12:59   Dg Abd Portable 1v  Result Date: 11/08/2015 CLINICAL DATA:  Small-bowel obstruction EXAM: PORTABLE ABDOMEN - 1 VIEW COMPARISON:  11/07/2015 FINDINGS: There is again noted small-bowel obstruction similar to that seen on the prior exam. Nasogastric catheter is not well appreciated on this exam. No definitive free air is seen. Left hip replacement and degenerative change of the lumbar spine are again noted. IMPRESSION: Persistent small bowel obstruction Electronically Signed   By: Inez Catalina M.D.   On: 11/08/2015 09:35      Scheduled Meds: . ampicillin-sulbactam (UNASYN) IV  3 g Intravenous Q6H  . antiseptic oral rinse  7 mL Mouth Rinse q12n4p  .  chlorhexidine  15 mL Mouth Rinse BID  . enoxaparin (LOVENOX) injection  40 mg Subcutaneous Q24H  . famotidine (PEPCID) IV  20 mg Intravenous Q24H  . metoprolol  5 mg Intravenous Q6H  . sodium chloride  1,000 mL Intravenous Once   Continuous Infusions: . sodium chloride 75 mL/hr at 11/09/15 0751  . diltiazem (CARDIZEM) infusion 15 mg/hr (11/09/15 1020)     LOS: 6 days    Time spent in minutes: Bruce, Mallarie Voorhies A, MD [facility] [id number] [email or web address] Password [email or web address] 11/09/2015, 11:30 AM

## 2015-11-09 NOTE — Anesthesia Postprocedure Evaluation (Signed)
Anesthesia Post Note  Patient: [name]  Procedure(s) Performed: Procedure(s) (LRB): EXPLORATORY LAPAROTOMY (N/A)  Patient location during evaluation: ICU Anesthesia Type: General Level of consciousness: sedated Pain management: pain level controlled Vital Signs Assessment: post-procedure vital signs reviewed and stable Respiratory status: patient on ventilator - see flowsheet for VS and patient remains intubated per anesthesia plan Cardiovascular status: stable Anesthetic complications: no    Last Vitals:  Vitals:   11/09/15 1800 11/09/15 1810  BP: (!) 95/46   Pulse: 66   Resp: 19   Temp:  36.4 C    Last Pain:  Vitals:   11/09/15 1810  TempSrc: Axillary  PainSc:                  [place]

## 2015-11-09 NOTE — Op Note (Addendum)
[name], [name]               ACCOUNT NO.:  [id number]  MEDICAL RECORD NO.:  [id number]  LOCATION:  [id number]                         FACILITY:  [facility]  PHYSICIAN:  Isabel Caprice. Hassell Done, MD  DATE OF BIRTH:  [birth year]  DATE OF PROCEDURE:  11/09/2015 DATE OF DISCHARGE:                              OPERATIVE REPORT   PREOPERATIVE INDICATIONS:  This is a 82 year old white male with a sudden onset of obstruction and what was thought to be an intussusception by CT scan.  He remains somewhat obstructed and, a followup CT showed foreign body in his terminal ileum.  The patient has been able to pass this.  PREOPERATIVE DIAGNOSIS:  Foreign body obstruction, terminal ileum.  POSTOPERATIVE DIAGNOSIS:  Bezoar of terminal ileum in an hourglass shape.  Probably, from Metamucil with near total obstruction, proximal bowel distention with early near perforation of multiple jejunal diverticula (these were oversewn with 3-0 silk sutures and reinforced).  Procedure:  Laparotomy with ileotomy and extraction of foreign body (bezoar), oversewing of multiple ischemic jejunal diverticula.    SURGEON:  Isabel Caprice. Hassell Done, MD  ASSISTANT:  None.  ANESTHESIA:  General endotracheal.  DESCRIPTION OF PROCEDURE:  The patient was taken to room 6 at [facility] and given general anesthesia.  The abdomen was prepped with [employer]ic[employer] and draped sterilely.  Access to the abdomen was achieved through a small midline incision going above and below the umbilicus entering the abdomen carefully.  The bowel was quite distended and mildly dusky upon entering.  I ran the bowel proximally and found these markedly dilated jejunal tics some with a yellow exudate on them, suggestive of some underlying possible ischemia.  I oversewed these with 3-0 silks.  There was no frank perforation and I felt that this was good.  The copious ascites was kind of a reddish light red color. Following this, I went down distally and with  my hand could feel the hard mass in the right lower quadrant.  There was an adhesion of the terminal ileum that was holding this in place.  I very carefully mobilized and felt, brought the cecum up, but this portion of terminal ileum was stuck in the right lower quadrant.  I used scissors to lyse a band that was holding it down, and when I did, I was able to bring up this terminal ileum with the hard mass in place that was obviously obstructing it.  This would not pass across the ileocecal valve.  I milked it back into the proximal ileum.  At that point, I put a wound protector in and isolated this.  I made a small otomy with the Bovie and delivered this hourglass shaped obstructing bezoar.  At that point, I inserted the [employer] sucker upstream and began decompressing a total of 1.75 L of succus entericus that was green colored.  There was no spillage.  This was all nicely controlled.  I then withdrew the [facility] sucker from the enterotomy and closed this in 2 layers from either end with 4-0 PDS in a canal Mayo fashion on an inner layer with an outer layer of seromuscular sutures of 3-0 silk.  We changed our gloves.  I irrigated  with a couple of liters of saline. I then closed the abdomen with a double-stranded #1 PDS from above and below and then tied these separately.  Wound was irrigated and __________ infiltrated with 20 mL of Exparel before closing the skin with a stapler.  The patient tolerated the procedure well and was taken to the recovery room in satisfactory condition.  FINAL DIAGNOSIS:  Small bowel obstruction from a bezoar with proximal ischemic changes and multiple jejunal diverticula.     Isabel Caprice Hassell Done, MD     MBM/MEDQ  D:  11/09/2015  T:  11/09/2015  Job:  [id number]

## 2015-11-09 NOTE — Progress Notes (Signed)
E-Link MD notified regarding pt HR and BP. Will continue to monitor.

## 2015-11-09 NOTE — Brief Op Note (Addendum)
11/03/2015 - 11/09/2015  4:47 PM  PATIENT:  [name]  82 y.o. male  PRE-OPERATIVE DIAGNOSIS:  small bowel obstruction  POST-OPERATIVE DIAGNOSIS:  small bowel obstruction due to bezoar in the terminal ileum (probable metamucil)  PROCEDURE:  Procedure(s): EXPLORATORY LAPAROTOMY (N/A) enterotomy and removal of bezoar with decompression of distended small bowel and oversewing of compromised jejunal diverticula reacting to distention  SURGEON:  Surgeon(s) and Role:    * [name], MD - Primary  PHYSICIAN ASSISTANT:   ASSISTANTS: none   ANESTHESIA:   general  EBL:  Total I/O In: 1061.3 [I.V.:911.3; IV Piggyback:150] Out: 325 [Urine:200; Blood:125]  BLOOD ADMINISTERED:none  DRAINS: none   LOCAL MEDICATIONS USED:  BUPIVICAINE   SPECIMEN:  Source of Specimen:  bezoar lodged in terminal ileum (metamucil?)  DISPOSITION OF SPECIMEN:  PATHOLOGY  COUNTS:  YES  TOURNIQUET:  * No tourniquets in log *  DICTATION: .Other Dictation: Dictation Number [id number]  PLAN OF CARE: Back to ICU intubated  PATIENT DISPOSITION:  ICU - intubated and hemodynamically stable.   Delay start of Pharmacological VTE agent (>24hrs) due to surgical blood loss or risk of bleeding: no

## 2015-11-09 NOTE — Consult Note (Signed)
Cardiology Consult    Patient ID: [name] MRN: [id number], DOB/AGE: 1921-11-22   Admit date: 11/03/2015 Date of Consult: 11/09/2015  Primary Physician: Jenny Reichmann, MD Reason for Consult: Preoperative Clearance; Atrial Fibrillation Primary Cardiologist: Dr. Harrington Challenger Requesting Provider: Dr. Tyrell Antonio   History of Present Illness    [name] is a 82 y.o. male with past medical history of chronic combined systolic and diastolic CHF (EF [id number] by echo in 2015) presumed to be nonischemic cardiomyopathy (no ischemia on NST in 2010), HTN, HLD, Hypothyroidism and Barrett's esophagus who presented to [facility] ED on 11/03/2015 for abdominal pain, nausea, and vomiting.   CT Imaging showed a small bowel obstruction secondary to an ileal to ileal intussusception in the right lower quadrant. Some inflammatory changes are noted surrounding the dilated loops in the left mid abdomen were noted. NG tube placed by surgery.   CXR with bibasilar opacities possibly reflecting atelectasis or PNA. Concern for aspiration PNA and he was started on Unasyn. Repeat CXR on 8/15 showed probable prior empyema on the right medially with calcification and right base atelectasis but lungs elsewhere clear. WBC peaked at 16.4 on 8/14, trending down to 14.5 today.   Went into atrial fibrillation on 8/12. Given IV Lopressor with brief returns to NSR. Started on IV Cardizem. EKG at time shows atrial fibrillation with RVR, HR 130, with nonspecific ST changes. A repeat echocardiogram was obtained which showed a reduced EF of 25-30% with diffuse HK, decreased from 45% in 2015. TSH and Mg WNL.   Has had intermittent episodes of confusion, pulling out his NG tube. This has been replaced. Abdominal film this AM shows bowel gas pattern remains of concern for a degree of bowel obstruction. No free air.   At the time of this encounter, patient is A&O x 3. Reports he is still having abdominal pain, which he describes as a  shooting pain occurring every 3-5 minutes. Nausea and vomiting resolved. He denies any recent chest discomfort, palpitations or worsening dyspnea. He is currently in atrial fibrillation with HR in the 130's Converted from NSR to atrial fibrillation around 1000 today. No history of atrial fibrillation prior to this admission.  Last seen by Dr. Harrington Challenger in 06/2015 and doing well at that time. Denied any chest discomfort or worsening dyspnea. Reported having 3 falls since his visit 6 months prior. Still active for a 82 yo. Does not regularly engage in exercise but performs ADL's independently. Still drives.   Past Medical History   Past Medical History:  Diagnosis Date  . BARRETTS ESOPHAGUS 11/19/2007  . Blood transfusion without reported diagnosis   . CHANGE IN BOWELS 02/01/2009  . Chronic combined systolic (congestive) and diastolic (congestive) heart failure ([place])    a. echo 2015: EF 45% b. Echo 10/2015: EF 25-30% with diffuse HK  . Diarrhea 01/31/2009  . DIVERTICULOSIS, COLON 11/19/2007  . GASTRITIS 11/19/2007  . GERD 11/19/2007  . HIATAL HERNIA 11/19/2007  . HTN (hypertension)   . HYPERLIPIDEMIA 06/27/2009  . HYPOTHYROIDISM 06/27/2009  . IBS (irritable bowel syndrome)   . MELENA 08/01/2009  . Nonischemic cardiomyopathy ([place])    a. NST in 2010 with no evidence of ischemia.  Marland Kitchen PERSONAL HX COLONIC POLYPS 02/01/2009  . TINEA CORPORIS 06/27/2009  . TOBACCO USE, QUIT 06/27/2009  . TREMOR, ESSENTIAL 06/27/2009    Past Surgical History:  Procedure Laterality Date  . CHOLECYSTECTOMY    . ESOPHAGOGASTRODUODENOSCOPY N/A 04/28/2013   Procedure: ESOPHAGOGASTRODUODENOSCOPY (EGD);  Surgeon:  Jerene Bears, MD;  Location: [facility];  Service: Gastroenterology;  Laterality: N/A;  . HERNIA REPAIR    . HIP ARTHROPLASTY Left 11/30/2013   Procedure: ARTHROPLASTY BIPOLAR HIP;  Surgeon: [name], MD;  Location: [facility];  Service: Orthopedics;  Laterality: Left;  . LUNG SURGERY  1984  . TONSILLECTOMY    . TONSILLECTOMY     . UMBILICAL HERNIA REPAIR    . VASECTOMY       Allergies  Allergies  Allergen Reactions  . Ciprofloxacin Other (See Comments)    Reaction:  Unknown   . Penicillins Rash and Other (See Comments)    Has patient had a PCN reaction causing immediate rash, facial/tongue/throat swelling, SOB or lightheadedness with hypotension: No Has patient had a PCN reaction causing severe rash involving mucus membranes or skin necrosis: No Has patient had a PCN reaction that required hospitalization No Has patient had a PCN reaction occurring within the last 10 years: No If all of the above answers are "NO", then may proceed with Cephalosporin use.    Inpatient Medications    . ampicillin-sulbactam (UNASYN) IV  3 g Intravenous Q6H  . antiseptic oral rinse  7 mL Mouth Rinse q12n4p  . chlorhexidine  15 mL Mouth Rinse BID  . enoxaparin (LOVENOX) injection  40 mg Subcutaneous Q24H  . famotidine (PEPCID) IV  20 mg Intravenous Q24H  . metoprolol  5 mg Intravenous Q6H  . sodium chloride  1,000 mL Intravenous Once    Family History    Family History  Problem Relation Age of Onset  . Heart disease Mother   . Colon cancer Father   . Prostate cancer Brother     Social History    Social History   Social History  . Marital status: Widowed    Spouse name: N/A  . Number of children: N/A  . Years of education: N/A   Occupational History  . retired Clinical biochemist    Social History Main Topics  . Smoking status: Former Smoker    Packs/day: 3.00    Years: 24.00    Types: Cigarettes    Quit date: 03/27/1943  . Smokeless tobacco: Never Used     Comment: Widowed x 2, has lady friend  . Alcohol use 0.6 oz/week    1 Standard drinks or equivalent per week     Comment: occassional  . Drug use: No  . Sexual activity: No   Other Topics Concern  . Not on file   Social History Narrative  . No narrative on file     Review of Systems    General:  No chills, fever, night sweats or weight  changes.  Cardiovascular:  No chest pain, dyspnea on exertion, edema, orthopnea, palpitations, paroxysmal nocturnal dyspnea. Dermatological: No rash, lesions/masses Respiratory: No cough, dyspnea Urologic: No hematuria, dysuria Abdominal:   No diarrhea, bright red blood per rectum, melena, or hematemesis. Positive for nausea, vomiting, and abdominal pain.  Neurologic:  No visual changes, wkns, changes in mental status. All other systems reviewed and are otherwise negative except as noted above.  Physical Exam    Blood pressure 128/83, pulse (!) 124, temperature 97.3 F (36.3 C), temperature source Oral, resp. rate 20, height 5\' 5"  (1.651 m), weight 142 lb 13.7 oz (64.8 kg), SpO2 100 %.  General: Pleasant, elderly Caucasian male appearing in NAD. Psych: Normal affect. Neuro: Alert and oriented X 3. Moves all extremities spontaneously. HEENT: Normal; NG tube in place.  Neck: Supple without bruits  or JVD. Lungs:  Resp regular and unlabored, mild rales at bases bilaterally. No wheezing appreciated. Heart: Irregularly irregular, tachycardiac, no s3, s4, or murmurs. Abdomen: Soft, non-tender, non-distended, BS + x 4.  Extremities: No clubbing, cyanosis or edema. DP/PT/Radials 2+ and equal bilaterally.  Labs    Troponin (Point of Care Test) No results for input(s): TROPIPOC in the last 72 hours. No results for input(s): CKTOTAL, CKMB, TROPONINI in the last 72 hours. Lab Results  Component Value Date   WBC 14.5 (H) 11/09/2015   HGB 12.4 (L) 11/09/2015   HCT 37.0 (L) 11/09/2015   MCV 89.2 11/09/2015   PLT 262 11/09/2015    Recent Labs Lab 11/03/15 1430  11/09/15 0315  NA 138  < > 140  K 4.7  < > 5.2*  CL 104  < > 111  CO2 25  < > 23  BUN 30*  < > 23*  CREATININE 1.17  < > 0.96  CALCIUM 8.0*  < > 8.1*  PROT 6.1*  --   --   BILITOT 1.2  --   --   ALKPHOS 137*  --   --   ALT 42  --   --   AST 66*  --   --   GLUCOSE 171*  < > 89  < > = values in this interval not  displayed. Lab Results  Component Value Date   CHOL 136 06/30/2015   HDL 42 06/30/2015   LDLCALC 78 06/30/2015   TRIG 78 06/30/2015   No results found for: [facility]   Radiology Studies    Ct Abdomen Pelvis Wo Contrast  Result Date: 11/06/2015 CLINICAL DATA:  Intussusception with SBO KUB unremarkable, but distended. No flatus or BM. Continue NGT Still confused, in gloves. No abdominal pain. Difficult to assess. Now on 3rd day of NGT. EXAM: CT ABDOMEN AND PELVIS WITHOUT CONTRAST TECHNIQUE: Multidetector CT imaging of the abdomen and pelvis was performed following the standard protocol without IV contrast. COMPARISON:  11/03/2015 FINDINGS: Lung bases: There is dependent lung base opacity associated with densely calcified pleural plaques and minimal pleural effusions. Lung base opacity most consistent with atelectasis. Heart is mildly enlarged. No convincing pulmonary edema. Hepatobiliary: Liver is unremarkable. Gallbladder surgically absent. No bile duct dilation. Spleen: 7 mm low-density lesion posteriorly. This is consistent with a cyst and is stable. There calcified splenic granuloma, also stable. Spleen normal in size. Pancreas: Unremarkable. Adrenal glands:  No masses. Kidneys, ureters, bladder: Bilateral renal cortical thinning. Small hyper attenuating lesion from the lateral lower pole the left kidney measuring 7 mm, likely a cyst complicated by previous hemorrhage or proteinaceous contents. It is stable. No other renal masses, no stones and no hydronephrosis. Normal ureters. Bladder is unremarkable. Vascular: Diffuse ectasia of the abdominal aorta with atherosclerotic calcifications. Infrarenal aorta measures 3 cm greatest diameter. Loss Lymph nodes:  No adenopathy. Ascites: Small amount ascites noted adjacent to liver, along the pericolic gutters and in the posterior pelvic recess. Gastrointestinal: There are 2 contiguous densities in the distal ileum, 1 measuring 19 mm and the other 2.3 cm in  greatest dimension. The current exam, this does not appear to be an intussusception but dense material within the small bowel. Bowel distal this is decompressed while bowel proximal to this is mildly distended. The colon is mostly decompressed. There is no bowel wall thickening. Normal appendix is visualized. Musculoskeletal: Well-positioned left hip total arthroplasty. Bones diffusely demineralized. There is a marked compression fracture of L2, which appears chronic. Mild  compression fractures of T12 and T8. Bones are diffusely demineralized. IMPRESSION: 1. Partial small bowel obstruction similar to the prior CT. What was felt be an ileal intussusception on the prior study now appears more consistent with 2 adjacent densities, consistent with stones or dense stool, in the distal ileum. These may or may not be the cause of the obstruction. Bowel distal to these densities is decompressed. There is currently no convincing intussusception. 2. Small amount ascites, increased when compared the prior CT. 3. Minimal pleural effusions and dependent lung base atelectasis, increased from the prior study. Densely calcified pleural plaque at the lung bases is stable. 4. Aortic atherosclerosis and aortic ectasia, of the infrarenal abdominal aorta measuring 3 cm. Recommend followup by ultrasound in 3 years. This recommendation follows ACR consensus guidelines: White Paper of the ACR Incidental Findings Committee II on Vascular Findings. [facility] Coll Radiol 2013; 10:789-794 Electronically Signed   By: Lajean Manes M.D.   On: 11/06/2015 11:25   Dg Abd 1 View  Result Date: 11/06/2015 CLINICAL DATA:  Followup for intussusception. EXAM: ABDOMEN - 1 VIEW COMPARISON:  11/05/2015 FINDINGS: Nasogastric tube passes below the diaphragm into the proximal stomach. There is mild small bowel dilation central abdomen. Air is seen within a normal caliber colon. There is some rectal air. Small bowel distention appears increased when compared to  the previous day's study. IMPRESSION: 1. Mild small bowel dilation increased when compared to the previous day's study. 2. Nasogastric tube is well positioned. Electronically Signed   By: Lajean Manes M.D.   On: 11/06/2015 08:55   Dg Abd 1 View  Result Date: 11/05/2015 CLINICAL DATA:  Nasogastric tube placement.  Initial encounter. EXAM: ABDOMEN - 1 VIEW COMPARISON:  Abdominal radiograph performed 11/04/2015 FINDINGS: The patient's enteric tube is seen ending overlying the body of the stomach. The visualized bowel gas pattern is grossly unremarkable. No free intra-abdominal air is seen, though evaluation for free air is limited on a single supine view. Scattered clips are seen at the right upper quadrant. Right basilar airspace opacity may reflect atelectasis or pneumonia. A small right pleural effusion is suspected. Prominent pleural calcification is noted at the medial right lung base. No acute osseous abnormalities are identified. Mild degenerative change is noted along the lower thoracic and lumbar spine. IMPRESSION: 1. Enteric tube seen ending overlying the body of the stomach. 2. Unremarkable bowel gas pattern; no free intra-abdominal air seen. 3. Right basilar airspace opacity may reflect atelectasis or pneumonia. Suspect small right pleural effusion. Electronically Signed   By: Garald Balding M.D.   On: 11/05/2015 06:47   Ct Abdomen Pelvis W Contrast  Result Date: 11/03/2015 CLINICAL DATA:  Intermittent left lower quadrant pain for several hours, initial encounter EXAM: CT ABDOMEN AND PELVIS WITH CONTRAST TECHNIQUE: Multidetector CT imaging of the abdomen and pelvis was performed using the standard protocol following bolus administration of intravenous contrast. CONTRAST:  68mL ISOVUE-300 IOPAMIDOL (ISOVUE-300) INJECTION 61% COMPARISON:  None. FINDINGS: Lower chest: Calcified pleural plaques are identified bilaterally. Mild bilateral lower lobe atelectatic changes are seen. No sizable effusion is  noted. Hepatobiliary: Gallbladder is been surgically removed. The liver is within normal limits. Pancreas: There is 11 mm hypodensity noted in the head of the pancreas best seen on image number 31 of series 2. Although this may represent a pancreatic cyst the possibility of small pancreatic lesion could not be totally excluded. Spleen: Small splenic cyst is noted. Adrenals/Urinary Tract: No masses identified. No evidence of hydronephrosis. Stomach/Bowel: There  is small bowel dilatation with some mild inflammatory changes surrounding the small bowel. This obstructive pattern continues into the right lower quadrant head which point there is an intussusception of the ileum within the ileum. No definitive mass lesion is seen. Small bowel beyond this intussusception is within normal limits. Scattered small bowel diverticula are noted. A large fluid containing diverticulum of the duodenum is seen adjacent to the pancreatic head. Moderate-sized hiatal hernia is noted. Vascular/Lymphatic: No pathologically enlarged lymph nodes. No evidence of abdominal aortic aneurysm. Reproductive: No mass or other significant abnormality. Other: Mild free fluid is noted within the pelvis. Musculoskeletal: Postsurgical changes in the left hip are noted. Chronic compression deformities at T12 and L2 are noted IMPRESSION: Small-bowel obstruction secondary to an ileal to ileal intussusception in the right lower quadrant. Some inflammatory changes are noted surrounding the dilated loops in the left mid abdomen. No evidence of perforation is identified. Hypodensity within the head of the pancreas which may represent a complicated cyst although the possibility of a neoplasm could not be totally excluded. The need for further evaluation can be determined on a clinical basis. Bibasilar changes left greater than right likely representing some acute on chronic infiltrate/atelectasis. Critical Value/emergent results were called by telephone at the  time of interpretation on 11/03/2015 at 3:54 pm to Dr. Jola Schmidt , who verbally acknowledged these results. Electronically Signed   By: Inez Catalina M.D.   On: 11/03/2015 15:56   Dg Chest Port 1 View  Result Date: 11/08/2015 CLINICAL DATA:  Hypertension.  Recent bowel intussusception EXAM: PORTABLE CHEST 1 VIEW COMPARISON:  November 07, 2015 FINDINGS: Nasogastric tube has been removed. There is patchy atelectasis in the right base region. Calcification on the right medially is likely due to prior emphysema, stable. No new opacity identified. Heart is upper normal in size with pulmonary vascularity within normal limits. There is atherosclerotic calcification aorta. Bones are osteoporotic. There is superior migration of each humeral head. IMPRESSION: Probable prior empyema on the right medially with calcification. Right base atelectasis. Lungs elsewhere clear. Stable cardiac silhouette. There is aortic atherosclerosis. There is evidence of bilateral chronic rotator cuff tears. Electronically Signed   By: Lowella Grip III M.D.   On: 11/08/2015 09:31   Dg Chest Port 1 View  Result Date: 11/07/2015 CLINICAL DATA:  Nasogastric tube. EXAM: PORTABLE CHEST 1 VIEW COMPARISON:  11/04/2015. FINDINGS: NG tube noted coiled stomach. Cardiomegaly with pulmonary vascular prominence interstitial prominence. No focal infiltrate. Mild bibasilar atelectasis. No pleural effusion or pneumothorax. IMPRESSION: 1. NG tube noted coiled stomach. 2. Cardiomegaly with pulmonary interstitial prominence consistent with congestive heart failure and interstitial edema. Small left pleural effusion. 3. Low lung volumes with bibasilar atelectasis . Electronically Signed   By: Marcello Moores  Register   On: 11/07/2015 09:00   Dg Chest Port 1 View  Result Date: 11/04/2015 CLINICAL DATA:  Nasogastric tube placement.  Initial encounter. EXAM: PORTABLE CHEST 1 VIEW COMPARISON:  Chest radiograph performed 02/10/2014 FINDINGS: The patient's enteric  tube is seen ending overlying the body of the stomach, with the side port about the gastroesophageal junction. This could be advanced several centimeters, as deemed clinically appropriate. The lungs are hypoexpanded. Bibasilar airspace opacities may reflect atelectasis or pneumonia. Small bilateral pleural effusions are suspected. No pneumothorax is seen. The cardiomediastinal silhouette is borderline normal in size. No acute osseous abnormalities are identified. Clips are noted within the right upper quadrant, reflecting prior cholecystectomy. IMPRESSION: 1. Enteric tube noted ending overlying the body of the stomach,  with the side port about the gastroesophageal junction. This could be advanced several centimeters, as deemed clinically appropriate. 2. Lungs hypoexpanded. Bibasilar airspace opacities may reflect atelectasis or pneumonia. Small bilateral pleural effusions suspected. Electronically Signed   By: Garald Balding M.D.   On: 11/04/2015 05:56   Dg Abd 2 Views  Result Date: 11/04/2015 CLINICAL DATA:  Check nasogastric catheter placement EXAM: ABDOMEN - 2 VIEW COMPARISON:  11/03/2015 FINDINGS: Scattered large and small bowel gas is noted. No obstructive changes seen. Bladder is well distended with contrast opacified urine. Left hip replacement is noted. A nasogastric catheter is noted within the stomach just beyond the gastroesophageal junction. No free air is seen. Pleural plaques are noted bilaterally. IMPRESSION: Nasogastric catheter within the stomach. The degree of small bowel dilatation has improved from the prior CT examination. Electronically Signed   By: Inez Catalina M.D.   On: 11/04/2015 08:17   Dg Abd Portable 1v  Result Date: 11/09/2015 CLINICAL DATA:  Small bowel obstruction EXAM: PORTABLE ABDOMEN - 1 VIEW COMPARISON:  November 08, 2015 FINDINGS: Nasogastric tube tip and side port are in proximal stomach. There remain multiple loops of dilated small bowel in a pattern concerning for a  degree of obstruction. No free air evident. There is evidence of prior calcified empyema is in the lung base regions. IMPRESSION: Bowel gas pattern remains of concern for a degree of bowel obstruction. No free air. Nasogastric tube tip and side port in proximal stomach. Electronically Signed   By: Lowella Grip III M.D.   On: 11/09/2015 07:01   Dg Abd Portable 1v  Result Date: 11/08/2015 CLINICAL DATA:  NG tube placement EXAM: PORTABLE ABDOMEN - 1 VIEW COMPARISON:  November 08, 2015 FINDINGS: The NG tube terminates in the distal esophagus. Recommend advancement. Persistent small bowel obstruction. Evaluation for free air is limited on this study but none is definitely seen. Pleural calcifications again identified. Minimal opacity in the lateral right lung is not completely excluded. No other acute abnormalities. IMPRESSION: 1. The NG tube side port is in the midesophagus and the distal tip is above the GE junction. Recommend advancement. 2. Persistent small bowel obstruction. 3. Calcified pleural plaques persists. These results will be called to the ordering clinician or representative by the Radiologist Assistant, and communication documented in the PACS or zVision Dashboard. Electronically Signed   By: Dorise Bullion III M.D   On: 11/08/2015 12:59   Dg Abd Portable 1v  Result Date: 11/08/2015 CLINICAL DATA:  Small-bowel obstruction EXAM: PORTABLE ABDOMEN - 1 VIEW COMPARISON:  11/07/2015 FINDINGS: There is again noted small-bowel obstruction similar to that seen on the prior exam. Nasogastric catheter is not well appreciated on this exam. No definitive free air is seen. Left hip replacement and degenerative change of the lumbar spine are again noted. IMPRESSION: Persistent small bowel obstruction Electronically Signed   By: Inez Catalina M.D.   On: 11/08/2015 09:35   Dg Abd Portable 1v  Result Date: 11/07/2015 CLINICAL DATA:  Abdominal distention. EXAM: PORTABLE ABDOMEN - 1 VIEW COMPARISON:  CT  11/06/2015.  Abdominal series 813 2017, 11/05/2015 FINDINGS: NG tube noted coiled in the stomach. Progressive distention of multiple loops of small bowel are noted. Paucity of colonic gas noted. No pneumatosis noted. No free air identified. Severe degenerative changes and scoliosis lumbar spine. Degenerative changes right hip. Left hip prosthesis. Diffuse osteopenia. Surgical sutures left groin. IMPRESSION: 1. NG tube noted coiled stomach. 2. Progressive distention of multiple loops of small bowel. Small bowel  loops are noted to be dilated up to 4.9 cm. Findings consistent with small-bowel obstruction. Electronically Signed   By: Marcello Moores  Register   On: 11/07/2015 06:43   Dg Abd Portable 1v  Result Date: 11/03/2015 CLINICAL DATA:  Nasogastric tube placement. EXAM: PORTABLE ABDOMEN - 1 VIEW COMPARISON:  CT 11/03/2015 FINDINGS: Nasogastric tube has been placed, tip overlying the level of the proximal stomach and possibly within the hiatal hernia or proximal stomach. Bowel gas pattern shows persistent small bowel dilatation. Contrast is identified within the urinary bladder following CT exam. IMPRESSION: Nasogastric tube tip likely within the proximal stomach or hiatal hernia. Persistent dilatation of small bowel loops. Electronically Signed   By: Nolon Nations M.D.   On: 11/03/2015 17:03    EKG & Cardiac Imaging    EKG: Atrial fibrillation with RVR, HR 130, with nonspecific ST changes.   Echocardiogram: 11/07/2015 Study Conclusions  - Left ventricle: The cavity size was normal. Wall thickness was   increased in a pattern of mild LVH. Indeterminant diastolic   function (atrial fibrillation). Systolic function was severely   reduced. The estimated ejection fraction was in the range of 25%   to 30%. Diffuse hypokinesis but lateral wall function looked   better than other walls. - Aortic valve: There was no stenosis. There was trivial   regurgitation. - Mitral valve: Mildly calcified annulus. There  was no significant   regurgitation. - Left atrium: The atrium was mildly dilated. - Right ventricle: The cavity size was normal. Systolic function   was mildly to moderately reduced. - Tricuspid valve: Peak RV-RA gradient (S): 31 mm Hg. - Systemic veins: IVC was not visualized.  Impressions:  - The patient was in atrial fibrillation. Normal LV size with mild   LV hypertrophy. EF 25-30%. Lateral wall function looked   relatively preserved compared to other walls. Normal RV size with   mild to moderately decreased systolic function. Mild pulmonary   hypertension.  Assessment & Plan    1. Preoperative Clearance for Surgical Treatment of Small Bowel Obstruction - admitted with nausea, vomiting, and abdominal pain. CT Imaging showed small bowel obstruction secondary to an ileal to ileal intussusception in the right lower quadrant. NG tube placed on admission and has been replaced this admission due to periods of confusion where the patient has pulled it out.  - Abd film this AM shows bowel gas pattern remains of concern for a degree of bowel obstruction. Still having abdominal pain at the time of this encounter. - while patient is 82yo he is still active for his age and enjoys going to restaurants and visiting friends. Does not engage in regular exercise, but denies any recent chest discomfort or dyspnea with exertion when engaging in these activities. Has known reduced EF thought to be secondary to nonischemic cardiomyopathy and new onset atrial fibrillation this admission. EKG is without acute ischemic changes. - due to age alone and known cardiomyopathy, he would be high-risk for the procedure, but it appears options are limited if symptoms do not improve with NG tube. With him denying any anginal symptoms and with advanced age, not sure how beneficial an ischemic evaluation would be as he will be high-risk if surgery is indicated. Dr. Claiborne Billings to see for final assessment.   2. Paroxysmal Atrial  Fibrillation - no history of this. Went into atrial fibrillation on 8/12 and has since had repeat episodes of PAF with return to NSR. Converted from NSR to atrial fibrillation around 1000 today. TSH and  Mg WNL. K+ elevated to 5.2. - currently on IV Cardizem for rate-control, HR in the 130's. Continue for now. Would avoid Cardizem long-term with known reduced EF.  - This patients CHA2DS2-VASc Score and unadjusted Ischemic Stroke Rate (% per year) is equal to 4.8 % stroke rate/year from a score of 4 (CHF, HTN, Age (2)). Reports having frequent falls. Would address the need for long-term anticoagulation following surgery with the patient and his family.   3. Chronic Combined Systolic and Diastolic CHF - Presumed to be nonischemic cardiomyopathy (no ischemia on NST in 2010) per review of Dr. Harrington Challenger' notes. Echocardiogram this admission shows a reduced EF of 25-30% with diffuse HK, decreased from 45% in 2015. PTA medications include Toprol-XL 25mg  daily, Prinzide 10-12.5mg  daily, and Lasix 20-40mg  daily (alternating doses on days of the week).  - receiving IVF with current illness and being NPO. Monitor fluid status closely as he is at high-risk to have an acute CHF exacerbation. Does not appear volume overloaded at time of exam.  - PTA medications held as he is NPO. Receiving IV Lopressor 5mg  Q6H.  4. Aspiration PNA - currently on Unasyn.  - per admitting team   Of note, patient is very hard of hearing. Utilized reverse stethoscope during encounter and this improved communication significantly.   Signed, Erma Heritage, PA-C 11/09/2015, 11:34 AM Pager: [phone]  Patient seen and examined. Agree with assessment and plan. Discussed with patient's family and Dr. Hassell Done. He has a h/o NICM with previous EF 45% in 2015. He is in need for imminent surgery for progressive SBO. He had developed AF up to 130 bpm and   fortunately he has converted back to NSR today. No chest pain. He has not had a recent  ischemic evaluaton. EF was reduced at 25 - 30% in the setting of his AF which may have contributed to decline in LV fx.  Pt is for surgery now. Will check ECG now pre-op since converted back to sinus rhythm and would check ECG post op. Will need post op telemetry. He will need med adjustments post operatively as he recovers. Will follow. If BP is stable post op, since he will be NPO, may need lopressor iv 2.5 mg q 4 hrs to reduce recurrent AF if BP allows.   Troy Sine, MD, [facility] 11/09/2015 1:20 PM

## 2015-11-09 NOTE — H&P (View-Only) (Signed)
Subjective: Remains confused.  Pulled ng tube out.  Objective: Vital signs in last 24 hours: Temp:  [97.4 F (36.3 C)-98.5 F (36.9 C)] 97.6 F (36.4 C) (08/15 0800) Pulse Rate:  [63-81] 63 (08/15 0600) Resp:  [16-29] 19 (08/15 0600) BP: (97-133)/(40-90) 97/47 (08/15 0600) SpO2:  [90 %-98 %] 98 % (08/15 0600) Last BM Date: 11/07/15  Intake/Output from previous day: 08/14 0701 - 08/15 0700 In: 2423.8 [I.V.:1923.8; IV Piggyback:500] Out: 600 [Urine:400; Emesis/NG output:200] Intake/Output this shift: No intake/output data recorded.  PE: General- In NAD Abdomen-slightly firm and distended, intermittent high pitched bowel sounds, no tenderness  Lab Results:   Recent Labs  11/06/15 0323 11/07/15 0725  WBC 16.1* 16.4*  HGB 12.3* 12.7*  HCT 37.0* 37.3*  PLT 229 245   BMET  Recent Labs  11/07/15 0725 11/08/15 0746  NA 140 141  K 3.1* 4.9  CL 109 111  CO2 24 25  GLUCOSE 136* 99  BUN 31* 28*  CREATININE 1.05 1.12  CALCIUM 8.2* 8.0*   PT/INR No results for input(s): LABPROT, INR in the last 72 hours. Comprehensive Metabolic Panel:    Component Value Date/Time   NA 141 11/08/2015 0746   NA 140 11/07/2015 0725   K 4.9 11/08/2015 0746   K 3.1 (L) 11/07/2015 0725   CL 111 11/08/2015 0746   CL 109 11/07/2015 0725   CO2 25 11/08/2015 0746   CO2 24 11/07/2015 0725   BUN 28 (H) 11/08/2015 0746   BUN 31 (H) 11/07/2015 0725   CREATININE 1.12 11/08/2015 0746   CREATININE 1.05 11/07/2015 0725   CREATININE 1.09 06/30/2015 0949   CREATININE 1.20 (H) 03/16/2015 0936   GLUCOSE 99 11/08/2015 0746   GLUCOSE 136 (H) 11/07/2015 0725   CALCIUM 8.0 (L) 11/08/2015 0746   CALCIUM 8.2 (L) 11/07/2015 0725   AST 66 (H) 11/03/2015 1430   AST 11 03/16/2015 0936   ALT 42 11/03/2015 1430   ALT 9 03/16/2015 0936   ALKPHOS 137 (H) 11/03/2015 1430   ALKPHOS 180 (H) 03/16/2015 0936   BILITOT 1.2 11/03/2015 1430   BILITOT 0.8 03/16/2015 0936   PROT 6.1 (L) 11/03/2015 1430    PROT 6.7 03/16/2015 0936   ALBUMIN 3.2 (L) 11/03/2015 1430   ALBUMIN 3.8 03/16/2015 0936     Studies/Results:  ECHO:  Left ventricle: The cavity size was normal. Wall thickness was   increased in a pattern of mild LVH. Indeterminant diastolic   function (atrial fibrillation). Systolic function was severely   reduced. The estimated ejection fraction was in the range of 25%   to 30%. Diffuse hypokinesis but lateral wall function looked   better than other walls. - Aortic valve: There was no stenosis. There was trivial   regurgitation. - Mitral valve: Mildly calcified annulus. There was no significant   regurgitation. - Left atrium: The atrium was mildly dilated. - Right ventricle: The cavity size was normal. Systolic function   was mildly to moderately reduced. - Tricuspid valve: Peak RV-RA gradient (S): 31 mm Hg. - Systemic veins: IVC was not visualized.  Ct Abdomen Pelvis Wo Contrast  Result Date: 11/06/2015 CLINICAL DATA:  Intussusception with SBO KUB unremarkable, but distended. No flatus or BM. Continue NGT Still confused, in gloves. No abdominal pain. Difficult to assess. Now on 3rd day of NGT. EXAM: CT ABDOMEN AND PELVIS WITHOUT CONTRAST TECHNIQUE: Multidetector CT imaging of the abdomen and pelvis was performed following the standard protocol without IV contrast. COMPARISON:  11/03/2015 FINDINGS: Lung  bases: There is dependent lung base opacity associated with densely calcified pleural plaques and minimal pleural effusions. Lung base opacity most consistent with atelectasis. Heart is mildly enlarged. No convincing pulmonary edema. Hepatobiliary: Liver is unremarkable. Gallbladder surgically absent. No bile duct dilation. Spleen: 7 mm low-density lesion posteriorly. This is consistent with a cyst and is stable. There calcified splenic granuloma, also stable. Spleen normal in size. Pancreas: Unremarkable. Adrenal glands:  No masses. Kidneys, ureters, bladder: Bilateral renal cortical  thinning. Small hyper attenuating lesion from the lateral lower pole the left kidney measuring 7 mm, likely a cyst complicated by previous hemorrhage or proteinaceous contents. It is stable. No other renal masses, no stones and no hydronephrosis. Normal ureters. Bladder is unremarkable. Vascular: Diffuse ectasia of the abdominal aorta with atherosclerotic calcifications. Infrarenal aorta measures 3 cm greatest diameter. Loss Lymph nodes:  No adenopathy. Ascites: Small amount ascites noted adjacent to liver, along the pericolic gutters and in the posterior pelvic recess. Gastrointestinal: There are 2 contiguous densities in the distal ileum, 1 measuring 19 mm and the other 2.3 cm in greatest dimension. The current exam, this does not appear to be an intussusception but dense material within the small bowel. Bowel distal this is decompressed while bowel proximal to this is mildly distended. The colon is mostly decompressed. There is no bowel wall thickening. Normal appendix is visualized. Musculoskeletal: Well-positioned left hip total arthroplasty. Bones diffusely demineralized. There is a marked compression fracture of L2, which appears chronic. Mild compression fractures of T12 and T8. Bones are diffusely demineralized. IMPRESSION: 1. Partial small bowel obstruction similar to the prior CT. What was felt be an ileal intussusception on the prior study now appears more consistent with 2 adjacent densities, consistent with stones or dense stool, in the distal ileum. These may or may not be the cause of the obstruction. Bowel distal to these densities is decompressed. There is currently no convincing intussusception. 2. Small amount ascites, increased when compared the prior CT. 3. Minimal pleural effusions and dependent lung base atelectasis, increased from the prior study. Densely calcified pleural plaque at the lung bases is stable. 4. Aortic atherosclerosis and aortic ectasia, of the infrarenal abdominal aorta  measuring 3 cm. Recommend followup by ultrasound in 3 years. This recommendation follows ACR consensus guidelines: White Paper of the ACR Incidental Findings Committee II on Vascular Findings. Natasha [employer] Coll Radiol 2013; 10:789-794 Electronically Signed   By: Lajean Manes M.D.   On: 11/06/2015 11:25   Dg Chest Port 1 View  Result Date: 11/08/2015 CLINICAL DATA:  Hypertension.  Recent bowel intussusception EXAM: PORTABLE CHEST 1 VIEW COMPARISON:  November 07, 2015 FINDINGS: Nasogastric tube has been removed. There is patchy atelectasis in the right base region. Calcification on the right medially is likely due to prior emphysema, stable. No new opacity identified. Heart is upper normal in size with pulmonary vascularity within normal limits. There is atherosclerotic calcification aorta. Bones are osteoporotic. There is superior migration of each humeral head. IMPRESSION: Probable prior empyema on the right medially with calcification. Right base atelectasis. Lungs elsewhere clear. Stable cardiac silhouette. There is aortic atherosclerosis. There is evidence of bilateral chronic rotator cuff tears. Electronically Signed   By: Lowella Grip III M.D.   On: 11/08/2015 09:31   Dg Chest Port 1 View  Result Date: 11/07/2015 CLINICAL DATA:  Nasogastric tube. EXAM: PORTABLE CHEST 1 VIEW COMPARISON:  11/04/2015. FINDINGS: NG tube noted coiled stomach. Cardiomegaly with pulmonary vascular prominence interstitial prominence. No focal infiltrate. Mild bibasilar  atelectasis. No pleural effusion or pneumothorax. IMPRESSION: 1. NG tube noted coiled stomach. 2. Cardiomegaly with pulmonary interstitial prominence consistent with congestive heart failure and interstitial edema. Small left pleural effusion. 3. Low lung volumes with bibasilar atelectasis . Electronically Signed   By: Marcello Moores  Register   On: 11/07/2015 09:00   Dg Abd Portable 1v  Result Date: 11/08/2015 CLINICAL DATA:  Small-bowel obstruction EXAM: PORTABLE  ABDOMEN - 1 VIEW COMPARISON:  11/07/2015 FINDINGS: There is again noted small-bowel obstruction similar to that seen on the prior exam. Nasogastric catheter is not well appreciated on this exam. No definitive free air is seen. Left hip replacement and degenerative change of the lumbar spine are again noted. IMPRESSION: Persistent small bowel obstruction Electronically Signed   By: Inez Catalina M.D.   On: 11/08/2015 09:35   Dg Abd Portable 1v  Result Date: 11/07/2015 CLINICAL DATA:  Abdominal distention. EXAM: PORTABLE ABDOMEN - 1 VIEW COMPARISON:  CT 11/06/2015.  Abdominal series 813 2017, 11/05/2015 FINDINGS: NG tube noted coiled in the stomach. Progressive distention of multiple loops of small bowel are noted. Paucity of colonic gas noted. No pneumatosis noted. No free air identified. Severe degenerative changes and scoliosis lumbar spine. Degenerative changes right hip. Left hip prosthesis. Diffuse osteopenia. Surgical sutures left groin. IMPRESSION: 1. NG tube noted coiled stomach. 2. Progressive distention of multiple loops of small bowel. Small bowel loops are noted to be dilated up to 4.9 cm. Findings consistent with small-bowel obstruction. Electronically Signed   By: Marcello Moores  Register   On: 11/07/2015 06:43    Anti-infectives: Anti-infectives    Start     Dose/Rate Route Frequency Ordered Stop   11/07/15 1600  Ampicillin-Sulbactam (UNASYN) 3 g in sodium chloride 0.9 % 100 mL IVPB     3 g 100 mL/hr over 60 Minutes Intravenous Every 6 hours 11/07/15 1024     11/06/15 2200  Ampicillin-Sulbactam (UNASYN) 3 g in sodium chloride 0.9 % 100 mL IVPB  Status:  Discontinued     3 g 100 mL/hr over 60 Minutes Intravenous Every 12 hours 11/06/15 1020 11/07/15 1024   11/03/15 1930  Ampicillin-Sulbactam (UNASYN) 3 g in sodium chloride 0.9 % 100 mL IVPB  Status:  Discontinued     3 g 100 mL/hr over 60 Minutes Intravenous Every 6 hours 11/03/15 1848 11/06/15 1020   11/03/15 1600  cefTRIAXone (ROCEPHIN) 1 g  in dextrose 5 % 50 mL IVPB     1 g 100 mL/hr over 30 Minutes Intravenous  Once 11/03/15 1558 11/03/15 1636   11/03/15 1600  azithromycin (ZITHROMAX) 500 mg in dextrose 5 % 250 mL IVPB     500 mg 250 mL/hr over 60 Minutes Intravenous  Once 11/03/15 1558 11/03/15 1858      Assessment Principal Problem:   Small bowel obstruction (HCC)-persistent and likely to due hyperdense lesion in distal ileum  Active Problems:     A-fib (HCC)   Chronic combined systolic and diastolic CHF (congestive heart failure) (HCC)   Aspiration pneumonia    LOS: 5 days   Plan: He may eventually require surgery once more medically stable.  Other option is palliative care. Will try ng again.   [name] 11/08/2015

## 2015-11-09 NOTE — Progress Notes (Signed)
Subjective: No real change, he seems clearer from a mentation status, no improvement in his abdomen.  His HR is controlled but looks like AF, no P's can be seen on telem.  Objective: Vital signs in last 24 hours: Temp:  [97.6 F (36.4 C)-98.8 F (37.1 C)] 97.6 F (36.4 C) (08/16 0347) Pulse Rate:  [68-117] 76 (08/16 0600) Resp:  [17-26] 17 (08/16 0600) BP: (112-151)/(40-95) 151/71 (08/16 0600) SpO2:  [92 %-100 %] 100 % (08/16 0600) Last BM Date: 11/08/15 250 from the NG yesterday recorded Urine 675 recorded Afebrile, VSS some tachycardia K+ 5.2 WBC 14.5 Film:  Bowel gas pattern remains of concern for a degree of bowel obstruction. No free air. Nasogastric tube tip and side port in proximal stomach. Intake/Output from previous day: 08/15 0701 - 08/16 0700 In: 2586.1 [I.V.:2086.1; IV Piggyback:500] Out: 925 [Urine:675; Emesis/NG output:250] Intake/Output this shift: No intake/output data recorded.  General appearance: alert, cooperative, no distress and can only understand about 1/2 of what we are saying secondary to hearing issues. GI: distended, hypoactive BS.  Not really tender now.    Lab Results:   Recent Labs  11/07/15 0725 11/09/15 0315  WBC 16.4* 14.5*  HGB 12.7* 12.4*  HCT 37.3* 37.0*  PLT 245 262    BMET  Recent Labs  11/08/15 0746 11/09/15 0315  NA 141 140  K 4.9 5.2*  CL 111 111  CO2 25 23  GLUCOSE 99 89  BUN 28* 23*  CREATININE 1.12 0.96  CALCIUM 8.0* 8.1*   PT/INR No results for input(s): LABPROT, INR in the last 72 hours.   Recent Labs Lab 11/03/15 1430  AST 66*  ALT 42  ALKPHOS 137*  BILITOT 1.2  PROT 6.1*  ALBUMIN 3.2*     Lipase     Component Value Date/Time   LIPASE 30 11/03/2015 1430     Studies/Results: Dg Chest Port 1 View  Result Date: 11/08/2015 CLINICAL DATA:  Hypertension.  Recent bowel intussusception EXAM: PORTABLE CHEST 1 VIEW COMPARISON:  November 07, 2015 FINDINGS: Nasogastric tube has been removed.  There is patchy atelectasis in the right base region. Calcification on the right medially is likely due to prior emphysema, stable. No new opacity identified. Heart is upper normal in size with pulmonary vascularity within normal limits. There is atherosclerotic calcification aorta. Bones are osteoporotic. There is superior migration of each humeral head. IMPRESSION: Probable prior empyema on the right medially with calcification. Right base atelectasis. Lungs elsewhere clear. Stable cardiac silhouette. There is aortic atherosclerosis. There is evidence of bilateral chronic rotator cuff tears. Electronically Signed   By: Lowella Grip III M.D.   On: 11/08/2015 09:31   Dg Chest Port 1 View  Result Date: 11/07/2015 CLINICAL DATA:  Nasogastric tube. EXAM: PORTABLE CHEST 1 VIEW COMPARISON:  11/04/2015. FINDINGS: NG tube noted coiled stomach. Cardiomegaly with pulmonary vascular prominence interstitial prominence. No focal infiltrate. Mild bibasilar atelectasis. No pleural effusion or pneumothorax. IMPRESSION: 1. NG tube noted coiled stomach. 2. Cardiomegaly with pulmonary interstitial prominence consistent with congestive heart failure and interstitial edema. Small left pleural effusion. 3. Low lung volumes with bibasilar atelectasis . Electronically Signed   By: Marcello Moores  Register   On: 11/07/2015 09:00   Dg Abd Portable 1v  Result Date: 11/09/2015 CLINICAL DATA:  Small bowel obstruction EXAM: PORTABLE ABDOMEN - 1 VIEW COMPARISON:  November 08, 2015 FINDINGS: Nasogastric tube tip and side port are in proximal stomach. There remain multiple loops of dilated small bowel in a pattern concerning  for a degree of obstruction. No free air evident. There is evidence of prior calcified empyema is in the lung base regions. IMPRESSION: Bowel gas pattern remains of concern for a degree of bowel obstruction. No free air. Nasogastric tube tip and side port in proximal stomach. Electronically Signed   By: Lowella Grip  III M.D.   On: 11/09/2015 07:01   Dg Abd Portable 1v  Result Date: 11/08/2015 CLINICAL DATA:  NG tube placement EXAM: PORTABLE ABDOMEN - 1 VIEW COMPARISON:  November 08, 2015 FINDINGS: The NG tube terminates in the distal esophagus. Recommend advancement. Persistent small bowel obstruction. Evaluation for free air is limited on this study but none is definitely seen. Pleural calcifications again identified. Minimal opacity in the lateral right lung is not completely excluded. No other acute abnormalities. IMPRESSION: 1. The NG tube side port is in the midesophagus and the distal tip is above the GE junction. Recommend advancement. 2. Persistent small bowel obstruction. 3. Calcified pleural plaques persists. These results will be called to the ordering clinician or representative by the Radiologist Assistant, and communication documented in the PACS or zVision Dashboard. Electronically Signed   By: Dorise Bullion III M.D   On: 11/08/2015 12:59   Dg Abd Portable 1v  Result Date: 11/08/2015 CLINICAL DATA:  Small-bowel obstruction EXAM: PORTABLE ABDOMEN - 1 VIEW COMPARISON:  11/07/2015 FINDINGS: There is again noted small-bowel obstruction similar to that seen on the prior exam. Nasogastric catheter is not well appreciated on this exam. No definitive free air is seen. Left hip replacement and degenerative change of the lumbar spine are again noted. IMPRESSION: Persistent small bowel obstruction Electronically Signed   By: Inez Catalina M.D.   On: 11/08/2015 09:35    Medications: . ampicillin-sulbactam (UNASYN) IV  3 g Intravenous Q6H  . antiseptic oral rinse  7 mL Mouth Rinse q12n4p  . chlorhexidine  15 mL Mouth Rinse BID  . enoxaparin (LOVENOX) injection  40 mg Subcutaneous Q24H  . famotidine (PEPCID) IV  20 mg Intravenous Q24H  . metoprolol  5 mg Intravenous Q6H  . sodium chloride  1,000 mL Intravenous Once   . sodium chloride 75 mL/hr at 11/09/15 0751  . diltiazem (CARDIZEM) infusion 2.5 mg/hr  (11/09/15 0754)    Cardiomyopathy with Chronic systolic CHF -(EF [id number] on ECHO 07/14/14 -- Now down to 25-30% EF on Echo 11/07/15) Abnormal pancreas on CT Vertebral compression fracture - L2 History of Barrett's esophagus Chronic renal insufficiency Hx of tobacco use Hypothyroidis Hypertension  Hyperlipidemia BPH Left femoral neck fx - 0000000 - Alucio Umbilical hernia repair - 06/2008 - Streck    Assessment/Plan SBO with ilieal to ileal intussusception - CT scan 8/13 shows no evidence of intussusception or bowel compromise there is a hyperdense material in small bowel, distal ileum no indication for surgery currently. Aspiration pneumonia Ongoing confusion/dementia Atrial fibrillation FEN: NPO/ no IV fluids listed ID:  Day 5 Unasyn DVT:  Lovenox  Plan:  Dr. Hassell Done will talk with the son about possible surgery today.  He is a very high risk.  He cannot understand allot of what we are telling him secondary to hearing issue.       LOS: 6 days    Khalessi Blough 11/09/[phone]

## 2015-11-09 NOTE — Anesthesia Preprocedure Evaluation (Addendum)
Anesthesia Evaluation  Patient identified by MRN, date of birth, ID band Patient awake    Reviewed: Allergy & Precautions, NPO status , Patient's Chart, lab work & pertinent test results  Airway Mallampati: II  TM Distance: >3 FB Neck ROM: Full    Dental  (+) Dental Advisory Given   Pulmonary pneumonia, former smoker,    breath sounds clear to auscultation       Cardiovascular hypertension, Pt. on medications +CHF  + dysrhythmias Atrial Fibrillation  Rhythm:Irregular Rate:Normal  The patient was in atrial fibrillation. Normal LV size with mildLV hypertrophy. EF 25-30%. Lateral wall function lookedrelatively preserved compared to other walls. Normal RV size withmild to moderately decreased systolic function. Mild pulmonaryhypertension.   Neuro/Psych negative neurological ROS     GI/Hepatic Neg liver ROS, GERD  ,SBO 2/2 intussusception.   Endo/Other  Hypothyroidism   Renal/GU Renal disease     Musculoskeletal   Abdominal   Peds  Hematology  (+) anemia ,   Anesthesia Other Findings   Reproductive/Obstetrics                            Lab Results  Component Value Date   WBC 14.5 (H) 11/09/2015   HGB 12.4 (L) 11/09/2015   HCT 37.0 (L) 11/09/2015   MCV 89.2 11/09/2015   PLT 262 11/09/2015   Lab Results  Component Value Date   CREATININE 0.96 11/09/2015   BUN 23 (H) 11/09/2015   NA 140 11/09/2015   K 5.2 (H) 11/09/2015   CL 111 11/09/2015   CO2 23 11/09/2015    Anesthesia Physical Anesthesia Plan  ASA: IV and emergent  Anesthesia Plan: General   Post-op Pain Management:    Induction: Intravenous  Airway Management Planned: Oral ETT  Additional Equipment: Arterial line  Intra-op Plan:   Post-operative Plan: Possible Post-op intubation/ventilation  Informed Consent: I have reviewed the patients History and Physical, chart, labs and discussed the procedure including  the risks, benefits and alternatives for the proposed anesthesia with the patient or authorized representative who has indicated his/her understanding and acceptance.   Dental advisory given  Plan Discussed with: CRNA  Anesthesia Plan Comments:       Anesthesia Quick Evaluation

## 2015-11-09 NOTE — Interval H&P Note (Signed)
History and Physical Interval Note:  11/09/2015 1:34 PM  [name]  has presented today for surgery, with the diagnosis of small bowel obstruction  The various methods of treatment have been discussed with the patient and family. After consideration of risks, benefits and other options for treatment, the patient has consented to  Procedure(s): EXPLORATORY LAPAROTOMY (N/A) as a surgical intervention .  The patient's history has been reviewed, patient examined, no change in status, stable for surgery.  I have reviewed the patient's chart and labs.  Questions were answered to the patient's satisfaction.     [name] B

## 2015-11-09 NOTE — Progress Notes (Signed)
[facility] Progress Note Patient Name: KILEN BOBLITT DOB: 12-02-1921 MRN: [id number]   Date of Service  11/09/2015  HPI/Events of Note  Bradycardia HR in 60s Hypotension SBP in 70s  eICU Interventions  DC propofol Use PRN fentanyl, versed Assess for extubation in AM.      Intervention Category Evaluation Type: Other  Jahkeem Kurka 11/09/2015, 10:39 PM

## 2015-11-09 NOTE — Transfer of Care (Addendum)
Immediate Anesthesia Transfer of Care Note  Patient: [name]  Procedure(s) Performed: Procedure(s): EXPLORATORY LAPAROTOMY (N/A)  Patient Location: ICU  Anesthesia Type:General  Level of Consciousness: sedated, unresponsive and Patient remains intubated per anesthesia plan  Airway & Oxygen Therapy: Patient remains intubated per anesthesia plan and Patient placed on Ventilator (see vital sign flow sheet for setting)  Post-op Assessment: Report given to RN and Post -op Vital signs reviewed and stable  Post vital signs: Reviewed and stable  Last Vitals:  Vitals:   11/09/15 1415 11/09/15 1416  BP:    Pulse:    Resp: (!) 25 (!) 23  Temp:      Last Pain:  Vitals:   11/09/15 1200  TempSrc: Axillary  PainSc:          Complications: No apparent anesthesia complications

## 2015-11-10 ENCOUNTER — Inpatient Hospital Stay (HOSPITAL_COMMUNITY): Payer: Medicare Other

## 2015-11-10 ENCOUNTER — Encounter (HOSPITAL_COMMUNITY): Payer: Self-pay | Admitting: Surgery

## 2015-11-10 DIAGNOSIS — K5669 Other intestinal obstruction: Secondary | ICD-10-CM

## 2015-11-10 DIAGNOSIS — J9601 Acute respiratory failure with hypoxia: Secondary | ICD-10-CM

## 2015-11-10 DIAGNOSIS — J189 Pneumonia, unspecified organism: Secondary | ICD-10-CM

## 2015-11-10 DIAGNOSIS — I480 Paroxysmal atrial fibrillation: Secondary | ICD-10-CM

## 2015-11-10 DIAGNOSIS — K5660 Unspecified intestinal obstruction: Secondary | ICD-10-CM

## 2015-11-10 LAB — POCT I-STAT 7, (LYTES, BLD GAS, ICA,H+H)
ACID-BASE DEFICIT: 5 mmol/L — AB (ref 0.0–2.0)
Bicarbonate: 19.6 mEq/L — ABNORMAL LOW (ref 20.0–24.0)
Calcium, Ion: 1.12 mmol/L (ref 1.12–1.23)
HEMATOCRIT: 33 % — AB (ref 39.0–52.0)
HEMOGLOBIN: 11.2 g/dL — AB (ref 13.0–17.0)
O2 Saturation: 99 %
PCO2 ART: 35.1 mmHg (ref 35.0–45.0)
Potassium: 5.5 mmol/L — ABNORMAL HIGH (ref 3.5–5.1)
SODIUM: 142 mmol/L (ref 135–145)
TCO2: 21 mmol/L (ref 0–100)
pH, Arterial: 7.352 (ref 7.350–7.450)
pO2, Arterial: 136 mmHg — ABNORMAL HIGH (ref 80.0–100.0)

## 2015-11-10 LAB — BASIC METABOLIC PANEL
ANION GAP: 5 (ref 5–15)
BUN: 22 mg/dL — ABNORMAL HIGH (ref 6–20)
CO2: 18 mmol/L — AB (ref 22–32)
Calcium: 7.3 mg/dL — ABNORMAL LOW (ref 8.9–10.3)
Chloride: 117 mmol/L — ABNORMAL HIGH (ref 101–111)
Creatinine, Ser: 0.98 mg/dL (ref 0.61–1.24)
GFR calc Af Amer: >60 mL/min (ref >60)
GFR calc non Af Amer: >60 mL/min (ref >60)
GLUCOSE: 144 mg/dL — AB (ref 65–99)
POTASSIUM: 4.4 mmol/L (ref 3.5–5.1)
Sodium: 140 mmol/L (ref 135–145)

## 2015-11-10 LAB — CBC
HEMATOCRIT: 36.6 % — AB (ref 39.0–52.0)
HEMOGLOBIN: 12.3 g/dL — AB (ref 13.0–17.0)
MCH: 29.9 pg (ref 26.0–34.0)
MCHC: 33.6 g/dL (ref 30.0–36.0)
MCV: 89.1 fL (ref 78.0–100.0)
Platelets: 287 10*3/uL (ref 150–400)
RBC: 4.11 MIL/uL — ABNORMAL LOW (ref 4.22–5.81)
RDW: 14.7 % (ref 11.5–15.5)
WBC: 16.7 10*3/uL — ABNORMAL HIGH (ref 4.0–10.5)

## 2015-11-10 LAB — TROPONIN I
TROPONIN I: 0.1 ng/mL — AB (ref <0.03)
TROPONIN I: 0.1 ng/mL — AB (ref <0.03)

## 2015-11-10 LAB — POTASSIUM: Potassium: 4.3 mmol/L (ref 3.5–5.1)

## 2015-11-10 LAB — PHOSPHORUS: Phosphorus: 3.1 mg/dL (ref 2.5–4.6)

## 2015-11-10 LAB — GLUCOSE, CAPILLARY
GLUCOSE-CAPILLARY: 143 mg/dL — AB (ref 65–99)
Glucose-Capillary: 135 mg/dL — ABNORMAL HIGH (ref 65–99)
Glucose-Capillary: 103 mg/dL — ABNORMAL HIGH (ref 65–99)

## 2015-11-10 LAB — PROCALCITONIN: Procalcitonin: 1.07 ng/mL

## 2015-11-10 LAB — MAGNESIUM
Magnesium: 1.6 mg/dL — ABNORMAL LOW (ref 1.7–2.4)
Magnesium: 2.1 mg/dL (ref 1.7–2.4)

## 2015-11-10 MED ORDER — FUROSEMIDE 10 MG/ML IJ SOLN
40.0000 mg | INTRAMUSCULAR | Status: AC
  Administered 2015-11-10: 40 mg via INTRAVENOUS
  Filled 2015-11-10: qty 4

## 2015-11-10 MED ORDER — SODIUM CHLORIDE 0.9 % IV SOLN
3.0000 g | Freq: Four times a day (QID) | INTRAVENOUS | Status: AC
  Administered 2015-11-10 – 2015-11-11 (×3): 3 g via INTRAVENOUS
  Filled 2015-11-10 (×3): qty 3

## 2015-11-10 MED ORDER — METOPROLOL TARTRATE 5 MG/5ML IV SOLN
INTRAVENOUS | Status: AC
  Filled 2015-11-10: qty 5

## 2015-11-10 MED ORDER — MAGNESIUM SULFATE 2 GM/50ML IV SOLN
2.0000 g | Freq: Once | INTRAVENOUS | Status: AC
  Administered 2015-11-10: 2 g via INTRAVENOUS
  Filled 2015-11-10: qty 50

## 2015-11-10 MED ORDER — SODIUM CHLORIDE 0.9 % IV SOLN
3.0000 g | Freq: Four times a day (QID) | INTRAVENOUS | Status: DC
  Administered 2015-11-10: 3 g via INTRAVENOUS
  Filled 2015-11-10: qty 3

## 2015-11-10 MED ORDER — FAMOTIDINE IN NACL 20-0.9 MG/50ML-% IV SOLN
20.0000 mg | INTRAVENOUS | Status: DC
  Administered 2015-11-11 – 2015-11-14 (×4): 20 mg via INTRAVENOUS
  Filled 2015-11-10 (×4): qty 50

## 2015-11-10 MED ORDER — FUROSEMIDE 10 MG/ML IJ SOLN
40.0000 mg | Freq: Once | INTRAMUSCULAR | Status: AC
  Administered 2015-11-10: 40 mg via INTRAVENOUS
  Filled 2015-11-10: qty 4

## 2015-11-10 MED ORDER — METOPROLOL TARTRATE 5 MG/5ML IV SOLN
5.0000 mg | Freq: Once | INTRAVENOUS | Status: AC
  Administered 2015-11-10: 5 mg via INTRAVENOUS

## 2015-11-10 MED ORDER — METOPROLOL TARTRATE 5 MG/5ML IV SOLN
5.0000 mg | INTRAVENOUS | Status: DC | PRN
  Administered 2015-11-10: 5 mg via INTRAVENOUS
  Filled 2015-11-10 (×2): qty 5

## 2015-11-10 NOTE — Progress Notes (Signed)
Hospital Problem List     Principal Problem:   Small bowel obstruction (HCC) Active Problems:   Essential hypertension   A-fib (HCC)   Chronic combined systolic and diastolic CHF (congestive heart failure) (HCC)   Compression fracture of L2 lumbar vertebra (HCC)   Intussusception [facility])    Patient Profile:   Primary Cardiologist: Dr. Harrington Challenger  82 y.o. male w/ PMH of chronic combined systolic and diastolic CHF (EF 68% by echo in 2015) presumed to be nonischemic cardiomyopathy (no ischemia on NST in 2010), HTN, HLD, Hypothyroidism and Barrett's esophagus who was admitted to [facility] on 11/03/2015 for SBO. Cards consulted for pre-op clearance.  Subjective   On vent. Patient arousable. Pressors had been discontinued.   Inpatient Medications    . antiseptic oral rinse  7 mL Mouth Rinse QID  . chlorhexidine gluconate (SAGE KIT)  15 mL Mouth Rinse BID  . enoxaparin (LOVENOX) injection  30 mg Subcutaneous Q24H  . famotidine (PEPCID) IV  20 mg Intravenous Q12H   . phenylephrine (NEO-SYNEPHRINE) Adult infusion Stopped (11/10/15 0350)   Vital Signs    Vitals:   11/10/15 0500 11/10/15 0600 11/10/15 0700 11/10/15 0800  BP:    (!) 120/59  Pulse: 76 79 86 88  Resp: 18 (!) 21 (!) 23 (!) 23  Temp:    98.3 F (36.8 C)  TempSrc:    Axillary  SpO2: 100% 100% 100% 100%  Weight:      Height:        Intake/Output Summary (Last 24 hours) at 11/10/15 0826 Last data filed at 11/10/15 0800  Gross per 24 hour  Intake          3332.89 ml  Output              760 ml  Net          2572.89 ml   Filed Weights   11/06/15 0500 11/10/15 0100 11/10/15 0407  Weight: 142 lb 13.7 oz (64.8 kg) 154 lb 8.7 oz (70.1 kg) 154 lb 8.7 oz (70.1 kg)    Physical Exam    General: Elderly Caucasian male appearing in no acute distress. Head: Normocephalic, atraumatic.  Neck: Supple without bruits, JVD not elevated. Lungs:  Resp regular and unlabored, CTA without wheezing or rales. Currently  intubated. Heart: RRR, S1, S2, no S3, S4, or murmur; no rub. Abdomen: Soft, non-tender, non-distended with normoactive bowel sounds. No hepatomegaly. No rebound/guarding. No obvious abdominal masses. Extremities: No clubbing, cyanosis, or edema. Distal pedal pulses are 2+ bilaterally. Neuro: Alert and oriented X 3. Moves all extremities spontaneously. Psych: Normal affect.  Labs    CBC  Recent Labs  11/09/15 0315 11/09/15 1526 11/10/15 0425  WBC 14.5*  --  16.7*  HGB 12.4* 11.2* 12.3*  HCT 37.0* 33.0* 36.6*  MCV 89.2  --  89.1  PLT 262  --  127   Basic Metabolic Panel  Recent Labs  11/09/15 2050 11/10/15 0745  NA 140 140  K 4.6 4.4  CL 118* 117*  CO2 16* 18*  GLUCOSE 122* 144*  BUN 25* 22*  CREATININE 0.98 0.98  CALCIUM 7.2* 7.3*   Liver Function Tests No results for input(s): AST, ALT, ALKPHOS, BILITOT, PROT, ALBUMIN in the last 72 hours. No results for input(s): LIPASE, AMYLASE in the last 72 hours. Cardiac Enzymes  Recent Labs  11/09/15 1803 11/10/15 0119 11/10/15 0425  TROPONINI 0.15* 0.10* 0.10*   BNP Invalid input(s): POCBNP D-Dimer No results for input(s):  DDIMER in the last 72 hours. Hemoglobin A1C No results for input(s): HGBA1C in the last 72 hours. Fasting Lipid Panel  Recent Labs  11/09/15 1803  TRIG 123   Thyroid Function Tests No results for input(s): TSH, T4TOTAL, T3FREE, THYROIDAB in the last 72 hours.  Invalid input(s): FREET3  Telemetry    NSR, HR in 60's - 80's.   ECG    NSR, HR 71, LAD, with PAC's.    Cardiac Studies and Radiology    Ct Abdomen Pelvis Wo Contrast  Result Date: 11/06/2015 CLINICAL DATA:  Intussusception with SBO KUB unremarkable, but distended. No flatus or BM. Continue NGT Still confused, in gloves. No abdominal pain. Difficult to assess. Now on 3rd day of NGT. EXAM: CT ABDOMEN AND PELVIS WITHOUT CONTRAST TECHNIQUE: Multidetector CT imaging of the abdomen and pelvis was performed following the standard  protocol without IV contrast. COMPARISON:  11/03/2015 FINDINGS: Lung bases: There is dependent lung base opacity associated with densely calcified pleural plaques and minimal pleural effusions. Lung base opacity most consistent with atelectasis. Heart is mildly enlarged. No convincing pulmonary edema. Hepatobiliary: Liver is unremarkable. Gallbladder surgically absent. No bile duct dilation. Spleen: 7 mm low-density lesion posteriorly. This is consistent with a cyst and is stable. There calcified splenic granuloma, also stable. Spleen normal in size. Pancreas: Unremarkable. Adrenal glands:  No masses. Kidneys, ureters, bladder: Bilateral renal cortical thinning. Small hyper attenuating lesion from the lateral lower pole the left kidney measuring 7 mm, likely a cyst complicated by previous hemorrhage or proteinaceous contents. It is stable. No other renal masses, no stones and no hydronephrosis. Normal ureters. Bladder is unremarkable. Vascular: Diffuse ectasia of the abdominal aorta with atherosclerotic calcifications. Infrarenal aorta measures 3 cm greatest diameter. Loss Lymph nodes:  No adenopathy. Ascites: Small amount ascites noted adjacent to liver, along the pericolic gutters and in the posterior pelvic recess. Gastrointestinal: There are 2 contiguous densities in the distal ileum, 1 measuring 19 mm and the other 2.3 cm in greatest dimension. The current exam, this does not appear to be an intussusception but dense material within the small bowel. Bowel distal this is decompressed while bowel proximal to this is mildly distended. The colon is mostly decompressed. There is no bowel wall thickening. Normal appendix is visualized. Musculoskeletal: Well-positioned left hip total arthroplasty. Bones diffusely demineralized. There is a marked compression fracture of L2, which appears chronic. Mild compression fractures of T12 and T8. Bones are diffusely demineralized. IMPRESSION: 1. Partial small bowel obstruction  similar to the prior CT. What was felt be an ileal intussusception on the prior study now appears more consistent with 2 adjacent densities, consistent with stones or dense stool, in the distal ileum. These may or may not be the cause of the obstruction. Bowel distal to these densities is decompressed. There is currently no convincing intussusception. 2. Small amount ascites, increased when compared the prior CT. 3. Minimal pleural effusions and dependent lung base atelectasis, increased from the prior study. Densely calcified pleural plaque at the lung bases is stable. 4. Aortic atherosclerosis and aortic ectasia, of the infrarenal abdominal aorta measuring 3 cm. Recommend followup by ultrasound in 3 years. This recommendation follows ACR consensus guidelines: White Paper of the ACR Incidental Findings Committee II on Vascular Findings. [facility] Radiol 2013; 10:789-794 Electronically Signed   By: Lajean Manes M.D.   On: 11/06/2015 11:25   Dg Abd 1 View  Result Date: 11/06/2015 CLINICAL DATA:  Followup for intussusception. EXAM: ABDOMEN - 1 VIEW  COMPARISON:  11/05/2015 FINDINGS: Nasogastric tube passes below the diaphragm into the proximal stomach. There is mild small bowel dilation central abdomen. Air is seen within a normal caliber colon. There is some rectal air. Small bowel distention appears increased when compared to the previous day's study. IMPRESSION: 1. Mild small bowel dilation increased when compared to the previous day's study. 2. Nasogastric tube is well positioned. Electronically Signed   By: Lajean Manes M.D.   On: 11/06/2015 08:55   Dg Abd 1 View  Result Date: 11/05/2015 CLINICAL DATA:  Nasogastric tube placement.  Initial encounter. EXAM: ABDOMEN - 1 VIEW COMPARISON:  Abdominal radiograph performed 11/04/2015 FINDINGS: The patient's enteric tube is seen ending overlying the body of the stomach. The visualized bowel gas pattern is grossly unremarkable. No free intra-abdominal air is  seen, though evaluation for free air is limited on a single supine view. Scattered clips are seen at the right upper quadrant. Right basilar airspace opacity may reflect atelectasis or pneumonia. A small right pleural effusion is suspected. Prominent pleural calcification is noted at the medial right lung base. No acute osseous abnormalities are identified. Mild degenerative change is noted along the lower thoracic and lumbar spine. IMPRESSION: 1. Enteric tube seen ending overlying the body of the stomach. 2. Unremarkable bowel gas pattern; no free intra-abdominal air seen. 3. Right basilar airspace opacity may reflect atelectasis or pneumonia. Suspect small right pleural effusion. Electronically Signed   By: Garald Balding M.D.   On: 11/05/2015 06:47   Ct Abdomen Pelvis W Contrast  Result Date: 11/03/2015 CLINICAL DATA:  Intermittent left lower quadrant pain for several hours, initial encounter EXAM: CT ABDOMEN AND PELVIS WITH CONTRAST TECHNIQUE: Multidetector CT imaging of the abdomen and pelvis was performed using the standard protocol following bolus administration of intravenous contrast. CONTRAST:  76m ISOVUE-300 IOPAMIDOL (ISOVUE-300) INJECTION 61% COMPARISON:  None. FINDINGS: Lower chest: Calcified pleural plaques are identified bilaterally. Mild bilateral lower lobe atelectatic changes are seen. No sizable effusion is noted. Hepatobiliary: Gallbladder is been surgically removed. The liver is within normal limits. Pancreas: There is 11 mm hypodensity noted in the head of the pancreas best seen on image number 31 of series 2. Although this may represent a pancreatic cyst the possibility of small pancreatic lesion could not be totally excluded. Spleen: Small splenic cyst is noted. Adrenals/Urinary Tract: No masses identified. No evidence of hydronephrosis. Stomach/Bowel: There is small bowel dilatation with some mild inflammatory changes surrounding the small bowel. This obstructive pattern continues into  the right lower quadrant head which point there is an intussusception of the ileum within the ileum. No definitive mass lesion is seen. Small bowel beyond this intussusception is within normal limits. Scattered small bowel diverticula are noted. A large fluid containing diverticulum of the duodenum is seen adjacent to the pancreatic head. Moderate-sized hiatal hernia is noted. Vascular/Lymphatic: No pathologically enlarged lymph nodes. No evidence of abdominal aortic aneurysm. Reproductive: No mass or other significant abnormality. Other: Mild free fluid is noted within the pelvis. Musculoskeletal: Postsurgical changes in the left hip are noted. Chronic compression deformities at T12 and L2 are noted IMPRESSION: Small-bowel obstruction secondary to an ileal to ileal intussusception in the right lower quadrant. Some inflammatory changes are noted surrounding the dilated loops in the left mid abdomen. No evidence of perforation is identified. Hypodensity within the head of the pancreas which may represent a complicated cyst although the possibility of a neoplasm could not be totally excluded. The need for further evaluation can be  determined on a clinical basis. Bibasilar changes left greater than right likely representing some acute on chronic infiltrate/atelectasis. Critical Value/emergent results were called by telephone at the time of interpretation on 11/03/2015 at 3:54 pm to Dr. Jola Schmidt , who verbally acknowledged these results. Electronically Signed   By: Inez Catalina M.D.   On: 11/03/2015 15:56   Dg Chest Port 1 View  Result Date: 11/09/2015 CLINICAL DATA:  Intubation. EXAM: PORTABLE CHEST 1 VIEW COMPARISON:  11/08/2015 FINDINGS: Endotracheal tube has its tip 1 cm above the carina. Nasogastric tube enters the stomach. The heart is enlarged. There is aortic atherosclerosis. There is infiltrate and/or volume loss in the lower lungs bilaterally. Some of the pulmonary opacities are somewhat nodular and  should be followed on subsequent exams. No effusions. IMPRESSION: Endotracheal tube tip 1 cm above the carina. Nasogastric tube enters stomach. Infiltrate and/or volume loss at both lung bases. Follow-up nodular shadows after treatment. Cardiomegaly.  Aortic atherosclerosis. Electronically Signed   By: Nelson Chimes M.D.   On: 11/09/2015 18:21   Dg Chest Port 1 View  Result Date: 11/08/2015 CLINICAL DATA:  Hypertension.  Recent bowel intussusception EXAM: PORTABLE CHEST 1 VIEW COMPARISON:  November 07, 2015 FINDINGS: Nasogastric tube has been removed. There is patchy atelectasis in the right base region. Calcification on the right medially is likely due to prior emphysema, stable. No new opacity identified. Heart is upper normal in size with pulmonary vascularity within normal limits. There is atherosclerotic calcification aorta. Bones are osteoporotic. There is superior migration of each humeral head. IMPRESSION: Probable prior empyema on the right medially with calcification. Right base atelectasis. Lungs elsewhere clear. Stable cardiac silhouette. There is aortic atherosclerosis. There is evidence of bilateral chronic rotator cuff tears. Electronically Signed   By: Lowella Grip III M.D.   On: 11/08/2015 09:31   Dg Chest Port 1 View  Result Date: 11/07/2015 CLINICAL DATA:  Nasogastric tube. EXAM: PORTABLE CHEST 1 VIEW COMPARISON:  11/04/2015. FINDINGS: NG tube noted coiled stomach. Cardiomegaly with pulmonary vascular prominence interstitial prominence. No focal infiltrate. Mild bibasilar atelectasis. No pleural effusion or pneumothorax. IMPRESSION: 1. NG tube noted coiled stomach. 2. Cardiomegaly with pulmonary interstitial prominence consistent with congestive heart failure and interstitial edema. Small left pleural effusion. 3. Low lung volumes with bibasilar atelectasis . Electronically Signed   By: Marcello Moores  Register   On: 11/07/2015 09:00   Dg Chest Port 1 View  Result Date: 11/04/2015 CLINICAL  DATA:  Nasogastric tube placement.  Initial encounter. EXAM: PORTABLE CHEST 1 VIEW COMPARISON:  Chest radiograph performed 02/10/2014 FINDINGS: The patient's enteric tube is seen ending overlying the body of the stomach, with the side port about the gastroesophageal junction. This could be advanced several centimeters, as deemed clinically appropriate. The lungs are hypoexpanded. Bibasilar airspace opacities may reflect atelectasis or pneumonia. Small bilateral pleural effusions are suspected. No pneumothorax is seen. The cardiomediastinal silhouette is borderline normal in size. No acute osseous abnormalities are identified. Clips are noted within the right upper quadrant, reflecting prior cholecystectomy. IMPRESSION: 1. Enteric tube noted ending overlying the body of the stomach, with the side port about the gastroesophageal junction. This could be advanced several centimeters, as deemed clinically appropriate. 2. Lungs hypoexpanded. Bibasilar airspace opacities may reflect atelectasis or pneumonia. Small bilateral pleural effusions suspected. Electronically Signed   By: Garald Balding M.D.   On: 11/04/2015 05:56   Dg Abd 2 Views  Result Date: 11/04/2015 CLINICAL DATA:  Check nasogastric catheter placement EXAM: ABDOMEN - 2  VIEW COMPARISON:  11/03/2015 FINDINGS: Scattered large and small bowel gas is noted. No obstructive changes seen. Bladder is well distended with contrast opacified urine. Left hip replacement is noted. A nasogastric catheter is noted within the stomach just beyond the gastroesophageal junction. No free air is seen. Pleural plaques are noted bilaterally. IMPRESSION: Nasogastric catheter within the stomach. The degree of small bowel dilatation has improved from the prior CT examination. Electronically Signed   By: Inez Catalina M.D.   On: 11/04/2015 08:17   Dg Abd Portable 1v  Result Date: 11/09/2015 CLINICAL DATA:  Small bowel obstruction EXAM: PORTABLE ABDOMEN - 1 VIEW COMPARISON:   November 08, 2015 FINDINGS: Nasogastric tube tip and side port are in proximal stomach. There remain multiple loops of dilated small bowel in a pattern concerning for a degree of obstruction. No free air evident. There is evidence of prior calcified empyema is in the lung base regions. IMPRESSION: Bowel gas pattern remains of concern for a degree of bowel obstruction. No free air. Nasogastric tube tip and side port in proximal stomach. Electronically Signed   By: Lowella Grip III M.D.   On: 11/09/2015 07:01   Dg Abd Portable 1v  Result Date: 11/08/2015 CLINICAL DATA:  NG tube placement EXAM: PORTABLE ABDOMEN - 1 VIEW COMPARISON:  November 08, 2015 FINDINGS: The NG tube terminates in the distal esophagus. Recommend advancement. Persistent small bowel obstruction. Evaluation for free air is limited on this study but none is definitely seen. Pleural calcifications again identified. Minimal opacity in the lateral right lung is not completely excluded. No other acute abnormalities. IMPRESSION: 1. The NG tube side port is in the midesophagus and the distal tip is above the GE junction. Recommend advancement. 2. Persistent small bowel obstruction. 3. Calcified pleural plaques persists. These results will be called to the ordering clinician or representative by the Radiologist Assistant, and communication documented in the PACS or zVision Dashboard. Electronically Signed   By: Dorise Bullion III M.D   On: 11/08/2015 12:59   Dg Abd Portable 1v  Result Date: 11/08/2015 CLINICAL DATA:  Small-bowel obstruction EXAM: PORTABLE ABDOMEN - 1 VIEW COMPARISON:  11/07/2015 FINDINGS: There is again noted small-bowel obstruction similar to that seen on the prior exam. Nasogastric catheter is not well appreciated on this exam. No definitive free air is seen. Left hip replacement and degenerative change of the lumbar spine are again noted. IMPRESSION: Persistent small bowel obstruction Electronically Signed   By: Inez Catalina  M.D.   On: 11/08/2015 09:35   Dg Abd Portable 1v  Result Date: 11/07/2015 CLINICAL DATA:  Abdominal distention. EXAM: PORTABLE ABDOMEN - 1 VIEW COMPARISON:  CT 11/06/2015.  Abdominal series 813 2017, 11/05/2015 FINDINGS: NG tube noted coiled in the stomach. Progressive distention of multiple loops of small bowel are noted. Paucity of colonic gas noted. No pneumatosis noted. No free air identified. Severe degenerative changes and scoliosis lumbar spine. Degenerative changes right hip. Left hip prosthesis. Diffuse osteopenia. Surgical sutures left groin. IMPRESSION: 1. NG tube noted coiled stomach. 2. Progressive distention of multiple loops of small bowel. Small bowel loops are noted to be dilated up to 4.9 cm. Findings consistent with small-bowel obstruction. Electronically Signed   By: Marcello Moores  Register   On: 11/07/2015 06:43   Dg Abd Portable 1v  Result Date: 11/03/2015 CLINICAL DATA:  Nasogastric tube placement. EXAM: PORTABLE ABDOMEN - 1 VIEW COMPARISON:  CT 11/03/2015 FINDINGS: Nasogastric tube has been placed, tip overlying the level of the proximal stomach and  possibly within the hiatal hernia or proximal stomach. Bowel gas pattern shows persistent small bowel dilatation. Contrast is identified within the urinary bladder following CT exam. IMPRESSION: Nasogastric tube tip likely within the proximal stomach or hiatal hernia. Persistent dilatation of small bowel loops. Electronically Signed   By: Nolon Nations M.D.   On: 11/03/2015 17:03    Echocardiogram: 11/07/2015 Study Conclusions  - Left ventricle: The cavity size was normal. Wall thickness was increased in a pattern of mild LVH. Indeterminant diastolic function (atrial fibrillation). Systolic function was severely reduced. The estimated ejection fraction was in the range of 25% to 30%. Diffuse hypokinesis but lateral wall function looked better than other walls. - Aortic valve: There was no stenosis. There was  trivial regurgitation. - Mitral valve: Mildly calcified annulus. There was no significant regurgitation. - Left atrium: The atrium was mildly dilated. - Right ventricle: The cavity size was normal. Systolic function was mildly to moderately reduced. - Tricuspid valve: Peak RV-RA gradient (S): 31 mm Hg. - Systemic veins: IVC was not visualized.  Impressions:  - The patient was in atrial fibrillation. Normal LV size with mild LV hypertrophy. EF 25-30%. Lateral wall function looked relatively preserved compared to other walls. Normal RV size with mild to moderately decreased systolic function. Mild pulmonary hypertension.  Assessment & Plan    1. Preoperative Clearance for Surgical Treatment of Small Bowel Obstruction - admitted with nausea, vomiting, and abdominal pain. CT Imaging showed small bowel obstruction secondary to an ileal to ileal intussusception in the right lower quadrant. NG tube placed on admission and has been replaced this admission due to periods of confusion where the patient has pulled it out.  - while patient is 82yo he is still active for his age and enjoys going to restaurants and visiting friends. Does not engage in regular exercise, but denies any recent chest discomfort or dyspnea with exertion when engaging in these activities. Has known reduced EF thought to be secondary to nonischemic cardiomyopathy and new onset atrial fibrillation this admission. EKG is without acute ischemic changes. - due to age alone and known cardiomyopathy, he would be high-risk for the procedure, but it appears options are limited if symptoms do not improve with NG tube.  - underwent surgery on 8/16. Placed on low-dose Neo drip which has now been discontinued. Currently on vent for airway protection. Possible extubation later today. Post-op EKG without acute ischemic changes. Maintaining NSR.   2. Paroxysmal Atrial Fibrillation - no history of this. Went into atrial  fibrillation on 8/12 and has since had repeat episodes of PAF with return to NSR.  TSH and Mg WNL.  - was on IV Cardizem, now discontinued. Currently in NSR. Not on any current cardiac medications as he required pressor support following surgery. Now off pressors. Would use IV Lopressor 2.39m Q4H once BP remains stabilized to reduce recurrent AF and transition back to PO once able to tolerate PO's and ok with surgery.  - This patients CHA2DS2-VASc Score and unadjusted Ischemic Stroke Rate (% per year) is equal to 4.8 % stroke rate/year from a score of 4 (CHF, HTN, Age (2)). Reports having frequent falls. Would address the need for long-term anticoagulation following surgery with the patient and his family.   3. Chronic Combined Systolic and Diastolic CHF - Presumed to be nonischemic cardiomyopathy (no ischemia on NST in 2010) per review of Dr. RHarrington Challenger notes. Echocardiogram this admission shows a reduced EF of 25-30% with diffuse HK, decreased from 45% in 2015.  PTA medications include Toprol-XL 64m daily, Prinzide 10-12.520mdaily, and Lasix 20-4069maily (alternating doses on days of the week).  - Overall, he is + 10.5L this admission due to being NPO and receiving IVF, weight up 16 lbs. Monitor fluid status closely as he is at high-risk to have an acute CHF exacerbation. Does not appear volume overloaded at time of exam.   4. Aspiration PNA - currently on Unasyn.  - per admitting team  5. Elevated Troponin - values flat at 0.15, 0.10, and 0.10. Likely secondary to demand ischemia in the post-operative setting and with recent atrial fibrillation. Post-op EKG without acute ischemic changes.   SigArna MediciPA-C 8:26 AM 11/10/2015 Pager: [phone] Patient seen and examined. Agree with assessment and plan.  Mr. BowVanecek now currently intubated on the ventilator.  ECG earlier today revealed sinus tachycardia at 1 23 bpm with QS complex anteriorly, left axis deviation,  probable left anterior hemiblock.  At present, he is in normal sinus rhythm with a heart rate in the 70s with a rare to occasional atrial premature complex.  His recent echo shown a decline in ejection fraction at 25-30%.  Mild flat plateau.  Troponin pattern is consistent with probable demand ischemia in the setting of his recent atrial fibrillation and postoperative state.  His ECG does not show any ischemic changes.  We will follow the patient.  Ultimately, he will need to be started on medications for his reduced LV function, but at present.  Blood pressure remains low in the 90 range systolic offNeo-Synephrine.   ThoTroy SineD, FACScottsdale Endoscopy Center17/2017 2:32 PM

## 2015-11-10 NOTE — Progress Notes (Signed)
Pt on vent less then  24 hours. Pt placed on wean per MD request.

## 2015-11-10 NOTE — Progress Notes (Signed)
Nutrition Follow-up  DOCUMENTATION CODES:   Not applicable  INTERVENTION:  - Will monitor for POC concerning nutrition per Surgery. - RD will follow-up 8/17.  NUTRITION DIAGNOSIS:   Inadequate oral intake related to inability to eat as evidenced by NPO status. -ongoing  GOAL:   Patient will meet greater than or equal to 90% of their needs -unmet  MONITOR:   Vent status, Weight trends, Labs, Skin, I & O's  REASON FOR ASSESSMENT:   Ventilator  ASSESSMENT:   82 y.o. male with medical history significant of cardiomyopathy, lung lobectomy in 1980s, Barretts esophagus, vertebral compression fracture who developed sudden onset abdominal pain and vomited once while at [facility] today around 11 pm. Pt then had one episode of emesis. Currently not in abdominal pain and has had no further vomiting.  Pt found to have SBO and NGT placed to suction.  8/17 Pt continues with NGT to suction with 100cc drainage present at this time. Pt now intubated following procedure yesterday: ex lap enterotomy with removal of bezoar and decompression of distended small bowel and oversewing of compromised jejunal diverticula.  Patient is currently intubated on ventilator support MV: 10.3 L/min Temp (24hrs), Avg:97.8 F (36.6 C), Min:97.3 F (36.3 C), Max:98.6 F (37 C) Propofol: none  Estimated nutrition needs updated this AM d/t ventilation. RT note states MD advised to start weaning. Bilateral mittens in place and no family/visitors present at this time. Pt off pressors and not on any sedation at this time. RD will follow-up tomorrow for POC concerning nutrition per surgery. Pt has now been NPO x7 days.  Medications reviewed.  Labs reviewed; Cl: 117 mmol/L, BUN: 22 mg/dL, Ca: 7.3 mg/dL. IVF: D5-1/2 NS-20 mEq KCl @ 50 mL/hr (204 kcal).   8/15 - Pt continues to be NPO since admission (5 days).  - NGT was adjusted yesterday and 75cc drainage obtained after adjustment.  - Per notes, pt pulled NGT  this AM; spoke with sitter who is at bedside who reports NGT was replaced today ~1100.  - At time of RD visit canister with ~150cc yellow, clear drainage.  - Pt is confused at time of visit.  - Notes from early this AM indicate pt was agitated and aggressive for a short time.  - Did not perform physical assessment at this time with respect to pt's comfort.  IVF: 1/2 NS-10 mEq KCl @ 125 mL/hr.   8/11 - NGT in place to suction with 300cc tan-ish brown drainage present at this time.  - Pt sleeping at this time and no family/visitors are currently present.  - Per Dr. Pollie Friar note, SBO seems to be improving.  - Unable to do physical assessment with respect to pt's comfort at this time but did note mild to moderate muscle wasting to temples, clavicles, and shoulders. -  - Per chart review, pt has lost 20 lbs (13% body weight) in the past 11 months which is not significant for time frame. - He has also lost 5 lbs (3.5% body weight) in the past 5 months which is also not significant for time frame.  IVF: NS @ 100 mL/hr   Diet Order:  Diet NPO time specified  Skin:  Reviewed, no issues  Last BM:  8/17  Height:   Ht Readings from Last 1 Encounters:  11/09/15 5\' 5"  (1.651 m)    Weight:   Wt Readings from Last 1 Encounters:  11/10/15 154 lb 8.7 oz (70.1 kg)    Ideal Body Weight:  61.82 kg  BMI:  Body mass index is 25.72 kg/m.  Estimated Nutritional Needs:   Kcal:  [id number]  Protein:  75-94 grams (1.2-1.5 grams/kg)  Fluid:  >/= 1.5 L/day  EDUCATION NEEDS:   No education needs identified at this time    Jarome Matin, MS, RD, LDN Inpatient Clinical Dietitian Pager # [phone] After hours/weekend pager # [phone]

## 2015-11-10 NOTE — Progress Notes (Signed)
  [facility] PCCM  Pts sinus tachycardia persisted despite being on full support on the ventilator. Heart rate in the 140s. EKG showed sinus tachycardia. Blood pressure was 90 systolic. He was comfortable. We ended up giving 5 mg IV Lopressor which controlled the heart rate and ended up being 90 systolic.  I extensively discussed the plan with patient's sons Dominica Severin and Luvenia Heller. They understand what was going on. We decided to keep him a full code. We will give him his best shot. If he's not getting better, they will consider DO NOT RESUSCITATE.  Monica Becton, MD 11/10/2015, 12:38 PM [facility] Pager [phone] After 3 pm or if no answer, call [phone]

## 2015-11-10 NOTE — Progress Notes (Signed)
1 Day Post-Op  Subjective: He looks pretty good right now.  He is on full vent but wakes easily.  He is in sinus rhythm.  Off pressors. I am concerned with low urine output, he has a foley in so we can track that very easily.  He has mittens on, and aline in place.  No bowel sounds, some serous like drainage bottom of Waffle dressing.    Objective: Vital signs in last 24 hours: Temp:  [97.3 F (36.3 C)-98.6 F (37 C)] 97.3 F (36.3 C) (08/17 0407) Pulse Rate:  [44-124] 86 (08/17 0700) Resp:  [15-27] 23 (08/17 0700) BP: (95-155)/(46-100) 98/48 (08/17 0330) SpO2:  [84 %-100 %] 100 % (08/17 0700) FiO2 (%):  [40 %-100 %] 40 % (08/17 0400) Weight:  [70.1 kg (154 lb 8.7 oz)] 70.1 kg (154 lb 8.7 oz) (08/17 0407) Last BM Date: 11/08/15 3069 IV 465 urine recorded BM x 1 recorded Afebrile,  Off pressor now, HR 70's Labs this Am WBC 16.7 Trop up slightly over night I do not see BMP for this AM so I will get one now Post op CXR:  Infiltrate and volume loss both lower lobes Intake/Output from previous day: 08/16 0701 - 08/17 0700 In: 3269.1 [I.V.:3069.1; IV Piggyback:200] Out: 740 [Urine:465; Emesis/NG output:150; Blood:125] Intake/Output this shift: No intake/output data recorded.  General appearance: alert and still on vent, has mittens on his right hand. Aline left hand with board restrained.   GI: Incision looks fine, some serous drainage, no bowel sounds  Lab Results:   Recent Labs  11/09/15 0315 11/10/15 0425  WBC 14.5* 16.7*  HGB 12.4* 12.3*  HCT 37.0* 36.6*  PLT 262 287    BMET  Recent Labs  11/09/15 0315 11/09/15 2050  NA 140 140  K 5.2* 4.6  CL 111 118*  CO2 23 16*  GLUCOSE 89 122*  BUN 23* 25*  CREATININE 0.96 0.98  CALCIUM 8.1* 7.2*   PT/INR No results for input(s): LABPROT, INR in the last 72 hours.   Recent Labs Lab 11/03/15 1430  AST 66*  ALT 42  ALKPHOS 137*  BILITOT 1.2  PROT 6.1*  ALBUMIN 3.2*     Lipase     Component Value  Date/Time   LIPASE 30 11/03/2015 1430     Studies/Results: Dg Chest Port 1 View  Result Date: 11/09/2015 CLINICAL DATA:  Intubation. EXAM: PORTABLE CHEST 1 VIEW COMPARISON:  11/08/2015 FINDINGS: Endotracheal tube has its tip 1 cm above the carina. Nasogastric tube enters the stomach. The heart is enlarged. There is aortic atherosclerosis. There is infiltrate and/or volume loss in the lower lungs bilaterally. Some of the pulmonary opacities are somewhat nodular and should be followed on subsequent exams. No effusions. IMPRESSION: Endotracheal tube tip 1 cm above the carina. Nasogastric tube enters stomach. Infiltrate and/or volume loss at both lung bases. Follow-up nodular shadows after treatment. Cardiomegaly.  Aortic atherosclerosis. Electronically Signed   By: Nelson Chimes M.D.   On: 11/09/2015 18:21   Dg Chest Port 1 View  Result Date: 11/08/2015 CLINICAL DATA:  Hypertension.  Recent bowel intussusception EXAM: PORTABLE CHEST 1 VIEW COMPARISON:  November 07, 2015 FINDINGS: Nasogastric tube has been removed. There is patchy atelectasis in the right base region. Calcification on the right medially is likely due to prior emphysema, stable. No new opacity identified. Heart is upper normal in size with pulmonary vascularity within normal limits. There is atherosclerotic calcification aorta. Bones are osteoporotic. There is superior migration of each humeral  head. IMPRESSION: Probable prior empyema on the right medially with calcification. Right base atelectasis. Lungs elsewhere clear. Stable cardiac silhouette. There is aortic atherosclerosis. There is evidence of bilateral chronic rotator cuff tears. Electronically Signed   By: Lowella Grip III M.D.   On: 11/08/2015 09:31   Dg Abd Portable 1v  Result Date: 11/09/2015 CLINICAL DATA:  Small bowel obstruction EXAM: PORTABLE ABDOMEN - 1 VIEW COMPARISON:  November 08, 2015 FINDINGS: Nasogastric tube tip and side port are in proximal stomach. There remain  multiple loops of dilated small bowel in a pattern concerning for a degree of obstruction. No free air evident. There is evidence of prior calcified empyema is in the lung base regions. IMPRESSION: Bowel gas pattern remains of concern for a degree of bowel obstruction. No free air. Nasogastric tube tip and side port in proximal stomach. Electronically Signed   By: Lowella Grip III M.D.   On: 11/09/2015 07:01   Dg Abd Portable 1v  Result Date: 11/08/2015 CLINICAL DATA:  NG tube placement EXAM: PORTABLE ABDOMEN - 1 VIEW COMPARISON:  November 08, 2015 FINDINGS: The NG tube terminates in the distal esophagus. Recommend advancement. Persistent small bowel obstruction. Evaluation for free air is limited on this study but none is definitely seen. Pleural calcifications again identified. Minimal opacity in the lateral right lung is not completely excluded. No other acute abnormalities. IMPRESSION: 1. The NG tube side port is in the midesophagus and the distal tip is above the GE junction. Recommend advancement. 2. Persistent small bowel obstruction. 3. Calcified pleural plaques persists. These results will be called to the ordering clinician or representative by the Radiologist Assistant, and communication documented in the PACS or zVision Dashboard. Electronically Signed   By: Dorise Bullion III M.D   On: 11/08/2015 12:59   Dg Abd Portable 1v  Result Date: 11/08/2015 CLINICAL DATA:  Small-bowel obstruction EXAM: PORTABLE ABDOMEN - 1 VIEW COMPARISON:  11/07/2015 FINDINGS: There is again noted small-bowel obstruction similar to that seen on the prior exam. Nasogastric catheter is not well appreciated on this exam. No definitive free air is seen. Left hip replacement and degenerative change of the lumbar spine are again noted. IMPRESSION: Persistent small bowel obstruction Electronically Signed   By: Inez Catalina M.D.   On: 11/08/2015 09:35    Medications: . antiseptic oral rinse  7 mL Mouth Rinse QID  .  chlorhexidine gluconate (SAGE KIT)  15 mL Mouth Rinse BID  . enoxaparin (LOVENOX) injection  30 mg Subcutaneous Q24H  . famotidine (PEPCID) IV  20 mg Intravenous Q12H   . dextrose 5 % and 0.45 % NaCl with KCl 20 mEq/L 75 mL/hr at 11/10/15 0700  . phenylephrine (NEO-SYNEPHRINE) Adult infusion Stopped (11/10/15 0350)    Cardiomyopathy with Chronic systolic CHF -(EF 68% on ECHO 07/14/14 -- Now down to 25-30% EF on Echo 11/07/15) Abnormal pancreas on CT Vertebral compression fracture - L2 History of Barrett's esophagus Chronic renal insufficiency Hx of tobacco use Hypothyroidis Hypertension  Hyperlipidemia BPH Left femoral neck fx - 03/26/5724 - Alucio Umbilical hernia repair - 06/2008 - Streck   Assessment/Plan SBO due to bezoar in the terminal ileum (probable metamucil) S/p EXPLORATORY LAPAROTOMY (N/A) enterotomy and removal of bezoar with decompression of distended small bowel and oversewing of compromised jejunal diverticula reacting to distention,11/09/15, Dr. [name]  POD1 VDRF post op day 1 Aspiration pneumonia on admit  Ongoing confusion/dementia Atrial fibrillation - now in SR Decreased urine output - foley in  BMP pending FEN:  NPO/ no IV fluids listed - tomorrow will be 7 days NPO - prealbumin in AM and discuss nutrition ID: Day 7 Unasyn completed 11/09/15 - no post op abx  DVT: Lovenox  Plan:  CCM is managing acute care and vent. Cardiology -  Dr. Claiborne Billings following also.     LOS: 7 days    Mujahid Jalomo 11/10/2015 [phone]

## 2015-11-10 NOTE — Progress Notes (Signed)
1 Day Post-Op  Subjective: On vent.  Failed PST trial. Awake.  Objective: Vital signs in last 24 hours: Temp:  [97.3 F (36.3 C)-98.6 F (37 C)] 98.3 F (36.8 C) (08/17 0800) Pulse Rate:  [44-100] 80 (08/17 0900) Resp:  [15-27] 20 (08/17 0900) BP: (95-142)/(46-98) 121/52 (08/17 0900) SpO2:  [84 %-100 %] 100 % (08/17 0900) FiO2 (%):  [40 %-100 %] 40 % (08/17 0844) Weight:  [70.1 kg (154 lb 8.7 oz)] 70.1 kg (154 lb 8.7 oz) (08/17 0407) Last BM Date: 11/10/15  Intake/Output from previous day: 08/16 0701 - 08/17 0700 In: 3269.1 [I.V.:3069.1; IV Piggyback:200] Out: 740 [Urine:465; Emesis/NG output:150; Blood:125] Intake/Output this shift: Total I/O In: 125 [I.V.:125] Out: 145 [Urine:20; Emesis/NG output:125]  PE: General- In NAD Abdomen-soft, distended, quiet, wound clean with honeycomb dressing on  Lab Results:   Recent Labs  11/09/15 0315 11/09/15 1526 11/10/15 0425  WBC 14.5*  --  16.7*  HGB 12.4* 11.2* 12.3*  HCT 37.0* 33.0* 36.6*  PLT 262  --  287   BMET  Recent Labs  11/09/15 2050 11/10/15 0745  NA 140 140  K 4.6 4.4  CL 118* 117*  CO2 16* 18*  GLUCOSE 122* 144*  BUN 25* 22*  CREATININE 0.98 0.98  CALCIUM 7.2* 7.3*   PT/INR No results for input(s): LABPROT, INR in the last 72 hours. Comprehensive Metabolic Panel:    Component Value Date/Time   NA 140 11/10/2015 0745   NA 140 11/09/2015 2050   K 4.4 11/10/2015 0745   K 4.6 11/09/2015 2050   CL 117 (H) 11/10/2015 0745   CL 118 (H) 11/09/2015 2050   CO2 18 (L) 11/10/2015 0745   CO2 16 (L) 11/09/2015 2050   BUN 22 (H) 11/10/2015 0745   BUN 25 (H) 11/09/2015 2050   CREATININE 0.98 11/10/2015 0745   CREATININE 0.98 11/09/2015 2050   CREATININE 1.09 06/30/2015 0949   CREATININE 1.20 (H) 03/16/2015 0936   GLUCOSE 144 (H) 11/10/2015 0745   GLUCOSE 122 (H) 11/09/2015 2050   CALCIUM 7.3 (L) 11/10/2015 0745   CALCIUM 7.2 (L) 11/09/2015 2050   AST 66 (H) 11/03/2015 1430   AST 11 03/16/2015 0936    ALT 42 11/03/2015 1430   ALT 9 03/16/2015 0936   ALKPHOS 137 (H) 11/03/2015 1430   ALKPHOS 180 (H) 03/16/2015 0936   BILITOT 1.2 11/03/2015 1430   BILITOT 0.8 03/16/2015 0936   PROT 6.1 (L) 11/03/2015 1430   PROT 6.7 03/16/2015 0936   ALBUMIN 3.2 (L) 11/03/2015 1430   ALBUMIN 3.8 03/16/2015 0936     Studies/Results: Dg Chest Port 1 View  Result Date: 11/10/2015 CLINICAL DATA:  Hypoxia EXAM: PORTABLE CHEST 1 VIEW COMPARISON:  November 09, 2015 FINDINGS: Endotracheal tube tip is 2.1 cm above the carina. Nasogastric tube tip and side port are in the stomach. No pneumothorax. There is a stable nodular appearing opacity in the right lower lobe, likely a granuloma. There is patchy atelectasis in both lower lobes. Foci of calcification are noted along each pleural surface, more on the right than on the left, findings consistent with either previous empyema or asbestos exposure. There is no new opacity. Heart is mildly enlarged with pulmonary vascularity within normal limits. There is atherosclerotic calcification in the aorta. No adenopathy evident. There is degenerative change in each shoulder. IMPRESSION: Tube positions as described without pneumothorax. Bibasilar atelectasis. Areas of pleural calcification bilaterally, more on the right than on the left, stable. Probable granuloma right lower lobe.  Aortic atherosclerosis. Stable cardiac prominence. No frank edema or consolidation evident. Electronically Signed   By: Lowella Grip III M.D.   On: 11/10/2015 08:42   Dg Chest Port 1 View  Result Date: 11/09/2015 CLINICAL DATA:  Intubation. EXAM: PORTABLE CHEST 1 VIEW COMPARISON:  11/08/2015 FINDINGS: Endotracheal tube has its tip 1 cm above the carina. Nasogastric tube enters the stomach. The heart is enlarged. There is aortic atherosclerosis. There is infiltrate and/or volume loss in the lower lungs bilaterally. Some of the pulmonary opacities are somewhat nodular and should be followed on subsequent  exams. No effusions. IMPRESSION: Endotracheal tube tip 1 cm above the carina. Nasogastric tube enters stomach. Infiltrate and/or volume loss at both lung bases. Follow-up nodular shadows after treatment. Cardiomegaly.  Aortic atherosclerosis. Electronically Signed   By: Nelson Chimes M.D.   On: 11/09/2015 18:21   Dg Abd Portable 1v  Result Date: 11/09/2015 CLINICAL DATA:  Small bowel obstruction EXAM: PORTABLE ABDOMEN - 1 VIEW COMPARISON:  November 08, 2015 FINDINGS: Nasogastric tube tip and side port are in proximal stomach. There remain multiple loops of dilated small bowel in a pattern concerning for a degree of obstruction. No free air evident. There is evidence of prior calcified empyema is in the lung base regions. IMPRESSION: Bowel gas pattern remains of concern for a degree of bowel obstruction. No free air. Nasogastric tube tip and side port in proximal stomach. Electronically Signed   By: Lowella Grip III M.D.   On: 11/09/2015 07:01   Dg Abd Portable 1v  Result Date: 11/08/2015 CLINICAL DATA:  NG tube placement EXAM: PORTABLE ABDOMEN - 1 VIEW COMPARISON:  November 08, 2015 FINDINGS: The NG tube terminates in the distal esophagus. Recommend advancement. Persistent small bowel obstruction. Evaluation for free air is limited on this study but none is definitely seen. Pleural calcifications again identified. Minimal opacity in the lateral right lung is not completely excluded. No other acute abnormalities. IMPRESSION: 1. The NG tube side port is in the midesophagus and the distal tip is above the GE junction. Recommend advancement. 2. Persistent small bowel obstruction. 3. Calcified pleural plaques persists. These results will be called to the ordering clinician or representative by the Radiologist Assistant, and communication documented in the PACS or zVision Dashboard. Electronically Signed   By: Dorise Bullion III M.D   On: 11/08/2015 12:59    Anti-infectives: Anti-infectives    Start      Dose/Rate Route Frequency Ordered Stop   11/10/15 1000  Ampicillin-Sulbactam (UNASYN) 3 g in sodium chloride 0.9 % 100 mL IVPB     3 g 100 mL/hr over 60 Minutes Intravenous Every 6 hours 11/10/15 0910 11/11/15 0959   11/07/15 1600  Ampicillin-Sulbactam (UNASYN) 3 g in sodium chloride 0.9 % 100 mL IVPB  Status:  Discontinued     3 g 100 mL/hr over 60 Minutes Intravenous Every 6 hours 11/07/15 1024 11/09/15 1731   11/06/15 2200  Ampicillin-Sulbactam (UNASYN) 3 g in sodium chloride 0.9 % 100 mL IVPB  Status:  Discontinued     3 g 100 mL/hr over 60 Minutes Intravenous Every 12 hours 11/06/15 1020 11/07/15 1024   11/03/15 1930  Ampicillin-Sulbactam (UNASYN) 3 g in sodium chloride 0.9 % 100 mL IVPB  Status:  Discontinued     3 g 100 mL/hr over 60 Minutes Intravenous Every 6 hours 11/03/15 1848 11/06/15 1020   11/03/15 1600  cefTRIAXone (ROCEPHIN) 1 g in dextrose 5 % 50 mL IVPB     1  g 100 mL/hr over 30 Minutes Intravenous  Once 11/03/15 1558 11/03/15 1636   11/03/15 1600  azithromycin (ZITHROMAX) 500 mg in dextrose 5 % 250 mL IVPB     500 mg 250 mL/hr over 60 Minutes Intravenous  Once 11/03/15 1558 11/03/15 1858      Assessment SBO due to bezoar in the terminal ileum (probable metamucil) S/p EXPLORATORY LAPAROTOMY (N/A)enterotomy and removal of bezoar with decompression of distended small bowel and oversewing of compromised jejunal diverticula reacting to distention,11/09/15, Dr. Lucinda Dell clean; unable to be extubated today; small green liquid BM.    LOS: 7 days   Plan: No enteral feeding yet.   [name] 11/10/2015

## 2015-11-10 NOTE — Progress Notes (Signed)
   [facility] PCCM  Pt was tachycardic at 130s (from 80s) and tachypneic with mild IC retraction during PST.  Pts EF is 25 %. Also 10L (+). Will hold off on extubating today. Diuresce today. Correct K and mg.  Hopefully in am.   Monica Becton, MD 11/10/2015, 10:15 AM [facility] Pager [phone] After 3 pm or if no answer, call [phone]

## 2015-11-10 NOTE — Progress Notes (Signed)
Pharmacy Antibiotic Note  [name] is a 82 y.o. male admitted on 11/03/2015 with Aspiration PNA, received Rocephin/Zmax x1 in ED.  Pharmacy has been consulted for Unasyn dosing.  PMH cardiomyopathy, lung lobectomy 1980s  Day 8 antibiotics  Plan:  Continue Unasyn 3g IV q6 hr through today to complete 8 days total tx  Height: 5\' 5"  (165.1 cm) Weight: 154 lb 8.7 oz (70.1 kg) IBW/kg (Calculated) : 61.5  Temp (24hrs), Avg:97.8 F (36.6 C), Min:97.3 F (36.3 C), Max:98.6 F (37 C)   Recent Labs Lab 11/04/15 0834 11/05/15 0418 11/06/15 0323 11/07/15 0725 11/08/15 0746 11/09/15 0315 11/09/15 1803 11/09/15 2020 11/09/15 2050 11/10/15 0425 11/10/15 0745  WBC 11.6* 13.7* 16.1* 16.4*  --  14.5*  --   --   --  16.7*  --   CREATININE 1.30* 1.12 1.36* 1.05 1.12 0.96  --   --  0.98  --  0.98  LATICACIDVEN 1.7  --   --   --   --   --  0.7 0.8  --   --   --     Estimated Creatinine Clearance: 41.8 mL/min (by C-G formula based on SCr of 0.98 mg/dL).    Allergies  Allergen Reactions  . Ciprofloxacin Other (See Comments)    Reaction:  Unknown   . Penicillins Rash and Other (See Comments)    Has patient had a PCN reaction causing immediate rash, facial/tongue/throat swelling, SOB or lightheadedness with hypotension: No Has patient had a PCN reaction causing severe rash involving mucus membranes or skin necrosis: No Has patient had a PCN reaction that required hospitalization No Has patient had a PCN reaction occurring within the last 10 years: No If all of the above answers are "NO", then may proceed with Cephalosporin use.    Antimicrobials this admission: R/Z x 1 8/10 Unasyn 8/10 >> 8/16   Dose adjustments this admission: 8/13 amps/sulb to 3gm q12h for inc in SCr 8/14: Change Unasyn to 3g IV q6 for CrCl > 30  Microbiology results: 8/10 MRSA PCR: neg Thank you for allowing pharmacy to be a part of this patient's care.  Reuel Boom, PharmD, BCPS Pager:  [phone] 11/10/2015, 9:14 AM

## 2015-11-10 NOTE — Progress Notes (Signed)
PT passed SBT. However, placed back on full support at approx 1035 due to HR >130. RN aware. Per RN, PT will remain intubated today to to cardiac condition.

## 2015-11-11 DIAGNOSIS — I429 Cardiomyopathy, unspecified: Secondary | ICD-10-CM

## 2015-11-11 DIAGNOSIS — T189XXA Foreign body of alimentary tract, part unspecified, initial encounter: Secondary | ICD-10-CM

## 2015-11-11 LAB — CBC WITH DIFFERENTIAL/PLATELET
BASOS ABS: 0 10*3/uL (ref 0.0–0.1)
Basophils Relative: 0 %
EOS ABS: 0.3 10*3/uL (ref 0.0–0.7)
EOS PCT: 3 %
HCT: 30.5 % — ABNORMAL LOW (ref 39.0–52.0)
Hemoglobin: 10.5 g/dL — ABNORMAL LOW (ref 13.0–17.0)
LYMPHS PCT: 7 %
Lymphs Abs: 0.9 10*3/uL (ref 0.7–4.0)
MCH: 30.4 pg (ref 26.0–34.0)
MCHC: 34.4 g/dL (ref 30.0–36.0)
MCV: 88.4 fL (ref 78.0–100.0)
MONO ABS: 1.7 10*3/uL — AB (ref 0.1–1.0)
Monocytes Relative: 13 %
Neutro Abs: 10 10*3/uL — ABNORMAL HIGH (ref 1.7–7.7)
Neutrophils Relative %: 77 %
PLATELETS: 302 10*3/uL (ref 150–400)
RBC: 3.45 MIL/uL — ABNORMAL LOW (ref 4.22–5.81)
RDW: 14.5 % (ref 11.5–15.5)
WBC: 12.9 10*3/uL — ABNORMAL HIGH (ref 4.0–10.5)

## 2015-11-11 LAB — PREALBUMIN: PREALBUMIN: 4.3 mg/dL — AB (ref 18–38)

## 2015-11-11 LAB — COMPREHENSIVE METABOLIC PANEL
ALK PHOS: 54 U/L (ref 38–126)
ALT: 12 U/L — AB (ref 17–63)
AST: 10 U/L — AB (ref 15–41)
Albumin: 1.7 g/dL — ABNORMAL LOW (ref 3.5–5.0)
Anion gap: 6 (ref 5–15)
BILIRUBIN TOTAL: 0.9 mg/dL (ref 0.3–1.2)
BUN: 20 mg/dL (ref 6–20)
CALCIUM: 7.3 mg/dL — AB (ref 8.9–10.3)
CHLORIDE: 114 mmol/L — AB (ref 101–111)
CO2: 21 mmol/L — ABNORMAL LOW (ref 22–32)
CREATININE: 1.29 mg/dL — AB (ref 0.61–1.24)
GFR calc Af Amer: 54 mL/min — ABNORMAL LOW (ref >60)
GFR, EST NON AFRICAN AMERICAN: 46 mL/min — AB (ref >60)
Glucose, Bld: 98 mg/dL (ref 65–99)
Potassium: 3.6 mmol/L (ref 3.5–5.1)
Sodium: 141 mmol/L (ref 135–145)
Total Protein: 4.3 g/dL — ABNORMAL LOW (ref 6.5–8.1)

## 2015-11-11 LAB — GLUCOSE, CAPILLARY
GLUCOSE-CAPILLARY: 75 mg/dL (ref 65–99)
GLUCOSE-CAPILLARY: 77 mg/dL (ref 65–99)
GLUCOSE-CAPILLARY: 78 mg/dL (ref 65–99)
GLUCOSE-CAPILLARY: 73 mg/dL (ref 65–99)
Glucose-Capillary: 103 mg/dL — ABNORMAL HIGH (ref 65–99)
Glucose-Capillary: 109 mg/dL — ABNORMAL HIGH (ref 65–99)
Glucose-Capillary: 85 mg/dL (ref 65–99)

## 2015-11-11 LAB — PROCALCITONIN: PROCALCITONIN: 1 ng/mL

## 2015-11-11 LAB — PHOSPHORUS: Phosphorus: 3.4 mg/dL (ref 2.5–4.6)

## 2015-11-11 LAB — MAGNESIUM: MAGNESIUM: 1.9 mg/dL (ref 1.7–2.4)

## 2015-11-11 MED ORDER — CHLORHEXIDINE GLUCONATE 0.12 % MT SOLN
15.0000 mL | Freq: Two times a day (BID) | OROMUCOSAL | Status: DC
  Administered 2015-11-11 – 2015-11-14 (×6): 15 mL via OROMUCOSAL
  Filled 2015-11-11 (×5): qty 15

## 2015-11-11 MED ORDER — CETYLPYRIDINIUM CHLORIDE 0.05 % MT LIQD
7.0000 mL | Freq: Two times a day (BID) | OROMUCOSAL | Status: DC
  Administered 2015-11-11 – 2015-11-13 (×4): 7 mL via OROMUCOSAL

## 2015-11-11 MED ORDER — MAGNESIUM SULFATE IN D5W 1-5 GM/100ML-% IV SOLN
1.0000 g | Freq: Once | INTRAVENOUS | Status: AC
Start: 2015-11-11 — End: 2015-11-11
  Administered 2015-11-11: 1 g via INTRAVENOUS
  Filled 2015-11-11: qty 100

## 2015-11-11 MED ORDER — METOPROLOL TARTRATE 5 MG/5ML IV SOLN
2.5000 mg | INTRAVENOUS | Status: DC
  Administered 2015-11-11 – 2015-11-13 (×10): 2.5 mg via INTRAVENOUS
  Filled 2015-11-11 (×11): qty 5

## 2015-11-11 NOTE — Progress Notes (Signed)
Hospital Problem List     Principal Problem:   Small bowel obstruction ([place]) Active Problems:   Essential hypertension   A-fib (HCC)   Chronic combined systolic and diastolic CHF (congestive heart failure) (HCC)   Compression fracture of L2 lumbar vertebra (HCC)   Intussusception (HCC)   Acute respiratory failure [facility])    Patient Profile:   Primary Cardiologist: Dr. Harrington Challenger  82 y.o.male w/ PMH of chronic combined systolic and diastolic CHF (EF 62% by echo in 2015) presumed to be nonischemic cardiomyopathy (no ischemia on NST in 2010), HTN, HLD, Hypothyroidism and Barrett's esophagus who was admitted to [facility] on 11/03/2015 for SBO. Cards consulted for pre-op clearance.  Subjective   Unable to be extubated yesterday secondary to tachycardia (confirmed to be sinus tach by EKG). Alert this morning. Making motions he wants to be extubated.   Inpatient Medications    . antiseptic oral rinse  7 mL Mouth Rinse QID  . chlorhexidine gluconate (SAGE KIT)  15 mL Mouth Rinse BID  . enoxaparin (LOVENOX) injection  30 mg Subcutaneous Q24H  . famotidine (PEPCID) IV  20 mg Intravenous Q24H    Vital Signs    Vitals:   11/11/15 0410 11/11/15 0500 11/11/15 0600 11/11/15 0830  BP: 122/71 (!) 118/53 (!) 105/58   Pulse: (!) 115     Resp: 18 19 (!) 22   Temp:      TempSrc:      SpO2: 100% 100% 100% 100%  Weight:      Height:        Intake/Output Summary (Last 24 hours) at 11/11/15 0839 Last data filed at 11/11/15 0600  Gross per 24 hour  Intake              610 ml  Output             3590 ml  Net            -2980 ml   Filed Weights   11/10/15 0100 11/10/15 0407 11/11/15 0330  Weight: 154 lb 8.7 oz (70.1 kg) 154 lb 8.7 oz (70.1 kg) 153 lb (69.4 kg)    Physical Exam    General: Elderly Caucasian, male appearing in no acute distress. Head: Normocephalic, atraumatic.  Neck: Supple without bruits, JVD not elevated. Lungs:  Resp regular and unlabored, no wheezing or rales  appreciated. Heart: RRR, S1, S2, no S3, S4, or murmur; no rub. Abdomen: Soft, non-distended with normoactive bowel sounds. No hepatomegaly. No rebound/guarding. No obvious abdominal masses. Extremities: No clubbing, cyanosis, or edema. Distal pedal pulses are 2+ bilaterally. Neuro: Alert and oriented X 3. Moves all extremities spontaneously. Psych: Normal affect.  Labs    CBC  Recent Labs  11/10/15 0425 11/11/15 0339  WBC 16.7* 12.9*  NEUTROABS  --  10.0*  HGB 12.3* 10.5*  HCT 36.6* 30.5*  MCV 89.1 88.4  PLT 287 263   Basic Metabolic Panel  Recent Labs  11/10/15 0745 11/10/15 1352 11/11/15 0339  NA 140  --  141  K 4.4 4.3 3.6  CL 117*  --  114*  CO2 18*  --  21*  GLUCOSE 144*  --  98  BUN 22*  --  20  CREATININE 0.98  --  1.29*  CALCIUM 7.3*  --  7.3*  MG 1.6* 2.1 1.9  PHOS 3.1  --  3.4   Liver Function Tests  Recent Labs  11/11/15 0339  AST 10*  ALT 12*  ALKPHOS 54  BILITOT 0.9  PROT 4.3*  ALBUMIN 1.7*   No results for input(s): LIPASE, AMYLASE in the last 72 hours. Cardiac Enzymes  Recent Labs  11/09/15 1803 11/10/15 0119 11/10/15 0425  TROPONINI 0.15* 0.10* 0.10*   BNP Invalid input(s): POCBNP D-Dimer No results for input(s): DDIMER in the last 72 hours. Hemoglobin A1C No results for input(s): HGBA1C in the last 72 hours. Fasting Lipid Panel  Recent Labs  11/09/15 1803  TRIG 123   Thyroid Function Tests No results for input(s): TSH, T4TOTAL, T3FREE, THYROIDAB in the last 72 hours.  Invalid input(s): FREET3  Telemetry    Sinus tachy, HR 120's yesterday afternoon and episodes of this overnight. HR improved to 70's - 80's currently.  ECG    Sinus tach, PAC's with HR 123   Cardiac Studies and Radiology    Ct Abdomen Pelvis Wo Contrast  Result Date: 11/06/2015 CLINICAL DATA:  Intussusception with SBO KUB unremarkable, but distended. No flatus or BM. Continue NGT Still confused, in gloves. No abdominal pain. Difficult to assess.  Now on 3rd day of NGT. EXAM: CT ABDOMEN AND PELVIS WITHOUT CONTRAST TECHNIQUE: Multidetector CT imaging of the abdomen and pelvis was performed following the standard protocol without IV contrast. COMPARISON:  11/03/2015 FINDINGS: Lung bases: There is dependent lung base opacity associated with densely calcified pleural plaques and minimal pleural effusions. Lung base opacity most consistent with atelectasis. Heart is mildly enlarged. No convincing pulmonary edema. Hepatobiliary: Liver is unremarkable. Gallbladder surgically absent. No bile duct dilation. Spleen: 7 mm low-density lesion posteriorly. This is consistent with a cyst and is stable. There calcified splenic granuloma, also stable. Spleen normal in size. Pancreas: Unremarkable. Adrenal glands:  No masses. Kidneys, ureters, bladder: Bilateral renal cortical thinning. Small hyper attenuating lesion from the lateral lower pole the left kidney measuring 7 mm, likely a cyst complicated by previous hemorrhage or proteinaceous contents. It is stable. No other renal masses, no stones and no hydronephrosis. Normal ureters. Bladder is unremarkable. Vascular: Diffuse ectasia of the abdominal aorta with atherosclerotic calcifications. Infrarenal aorta measures 3 cm greatest diameter. Loss Lymph nodes:  No adenopathy. Ascites: Small amount ascites noted adjacent to liver, along the pericolic gutters and in the posterior pelvic recess. Gastrointestinal: There are 2 contiguous densities in the distal ileum, 1 measuring 19 mm and the other 2.3 cm in greatest dimension. The current exam, this does not appear to be an intussusception but dense material within the small bowel. Bowel distal this is decompressed while bowel proximal to this is mildly distended. The colon is mostly decompressed. There is no bowel wall thickening. Normal appendix is visualized. Musculoskeletal: Well-positioned left hip total arthroplasty. Bones diffusely demineralized. There is a marked  compression fracture of L2, which appears chronic. Mild compression fractures of T12 and T8. Bones are diffusely demineralized. IMPRESSION: 1. Partial small bowel obstruction similar to the prior CT. What was felt be an ileal intussusception on the prior study now appears more consistent with 2 adjacent densities, consistent with stones or dense stool, in the distal ileum. These may or may not be the cause of the obstruction. Bowel distal to these densities is decompressed. There is currently no convincing intussusception. 2. Small amount ascites, increased when compared the prior CT. 3. Minimal pleural effusions and dependent lung base atelectasis, increased from the prior study. Densely calcified pleural plaque at the lung bases is stable. 4. Aortic atherosclerosis and aortic ectasia, of the infrarenal abdominal aorta measuring 3 cm. Recommend followup by ultrasound in 3 years.  This recommendation follows ACR consensus guidelines: White Paper of the ACR Incidental Findings Committee II on Vascular Findings. [facility] Radiol 2013; 10:[id number] Electronically Signed   By: Lajean Manes M.D.   On: 11/06/2015 11:25   Dg Abd 1 View  Result Date: 11/06/2015 CLINICAL DATA:  Followup for intussusception. EXAM: ABDOMEN - 1 VIEW COMPARISON:  11/05/2015 FINDINGS: Nasogastric tube passes below the diaphragm into the proximal stomach. There is mild small bowel dilation central abdomen. Air is seen within a normal caliber colon. There is some rectal air. Small bowel distention appears increased when compared to the previous day's study. IMPRESSION: 1. Mild small bowel dilation increased when compared to the previous day's study. 2. Nasogastric tube is well positioned. Electronically Signed   By: Lajean Manes M.D.   On: 11/06/2015 08:55   Dg Abd 1 View  Result Date: 11/05/2015 CLINICAL DATA:  Nasogastric tube placement.  Initial encounter. EXAM: ABDOMEN - 1 VIEW COMPARISON:  Abdominal radiograph performed 11/04/2015  FINDINGS: The patient's enteric tube is seen ending overlying the body of the stomach. The visualized bowel gas pattern is grossly unremarkable. No free intra-abdominal air is seen, though evaluation for free air is limited on a single supine view. Scattered clips are seen at the right upper quadrant. Right basilar airspace opacity may reflect atelectasis or pneumonia. A small right pleural effusion is suspected. Prominent pleural calcification is noted at the medial right lung base. No acute osseous abnormalities are identified. Mild degenerative change is noted along the lower thoracic and lumbar spine. IMPRESSION: 1. Enteric tube seen ending overlying the body of the stomach. 2. Unremarkable bowel gas pattern; no free intra-abdominal air seen. 3. Right basilar airspace opacity may reflect atelectasis or pneumonia. Suspect small right pleural effusion. Electronically Signed   By: Garald Balding M.D.   On: 11/05/2015 06:47   Ct Abdomen Pelvis W Contrast  Result Date: 11/03/2015 CLINICAL DATA:  Intermittent left lower quadrant pain for several hours, initial encounter EXAM: CT ABDOMEN AND PELVIS WITH CONTRAST TECHNIQUE: Multidetector CT imaging of the abdomen and pelvis was performed using the standard protocol following bolus administration of intravenous contrast. CONTRAST:  67m ISOVUE-300 IOPAMIDOL (ISOVUE-300) INJECTION 61% COMPARISON:  None. FINDINGS: Lower chest: Calcified pleural plaques are identified bilaterally. Mild bilateral lower lobe atelectatic changes are seen. No sizable effusion is noted. Hepatobiliary: Gallbladder is been surgically removed. The liver is within normal limits. Pancreas: There is 11 mm hypodensity noted in the head of the pancreas best seen on image number 31 of series 2. Although this may represent a pancreatic cyst the possibility of small pancreatic lesion could not be totally excluded. Spleen: Small splenic cyst is noted. Adrenals/Urinary Tract: No masses identified. No  evidence of hydronephrosis. Stomach/Bowel: There is small bowel dilatation with some mild inflammatory changes surrounding the small bowel. This obstructive pattern continues into the right lower quadrant head which point there is an intussusception of the ileum within the ileum. No definitive mass lesion is seen. Small bowel beyond this intussusception is within normal limits. Scattered small bowel diverticula are noted. A large fluid containing diverticulum of the duodenum is seen adjacent to the pancreatic head. Moderate-sized hiatal hernia is noted. Vascular/Lymphatic: No pathologically enlarged lymph nodes. No evidence of abdominal aortic aneurysm. Reproductive: No mass or other significant abnormality. Other: Mild free fluid is noted within the pelvis. Musculoskeletal: Postsurgical changes in the left hip are noted. Chronic compression deformities at T12 and L2 are noted IMPRESSION: Small-bowel obstruction secondary to an ileal  to ileal intussusception in the right lower quadrant. Some inflammatory changes are noted surrounding the dilated loops in the left mid abdomen. No evidence of perforation is identified. Hypodensity within the head of the pancreas which may represent a complicated cyst although the possibility of a neoplasm could not be totally excluded. The need for further evaluation can be determined on a clinical basis. Bibasilar changes left greater than right likely representing some acute on chronic infiltrate/atelectasis. Critical Value/emergent results were called by telephone at the time of interpretation on 11/03/2015 at 3:54 pm to Dr. Jola Schmidt , who verbally acknowledged these results. Electronically Signed   By: Inez Catalina M.D.   On: 11/03/2015 15:56   Dg Chest Port 1 View  Result Date: 11/10/2015 CLINICAL DATA:  Hypoxia EXAM: PORTABLE CHEST 1 VIEW COMPARISON:  November 09, 2015 FINDINGS: Endotracheal tube tip is 2.1 cm above the carina. Nasogastric tube tip and side port are in the  stomach. No pneumothorax. There is a stable nodular appearing opacity in the right lower lobe, likely a granuloma. There is patchy atelectasis in both lower lobes. Foci of calcification are noted along each pleural surface, more on the right than on the left, findings consistent with either previous empyema or asbestos exposure. There is no new opacity. Heart is mildly enlarged with pulmonary vascularity within normal limits. There is atherosclerotic calcification in the aorta. No adenopathy evident. There is degenerative change in each shoulder. IMPRESSION: Tube positions as described without pneumothorax. Bibasilar atelectasis. Areas of pleural calcification bilaterally, more on the right than on the left, stable. Probable granuloma right lower lobe. Aortic atherosclerosis. Stable cardiac prominence. No frank edema or consolidation evident. Electronically Signed   By: Lowella Grip III M.D.   On: 11/10/2015 08:42   Dg Chest Port 1 View  Result Date: 11/09/2015 CLINICAL DATA:  Intubation. EXAM: PORTABLE CHEST 1 VIEW COMPARISON:  11/08/2015 FINDINGS: Endotracheal tube has its tip 1 cm above the carina. Nasogastric tube enters the stomach. The heart is enlarged. There is aortic atherosclerosis. There is infiltrate and/or volume loss in the lower lungs bilaterally. Some of the pulmonary opacities are somewhat nodular and should be followed on subsequent exams. No effusions. IMPRESSION: Endotracheal tube tip 1 cm above the carina. Nasogastric tube enters stomach. Infiltrate and/or volume loss at both lung bases. Follow-up nodular shadows after treatment. Cardiomegaly.  Aortic atherosclerosis. Electronically Signed   By: Nelson Chimes M.D.   On: 11/09/2015 18:21   Dg Chest Port 1 View  Result Date: 11/08/2015 CLINICAL DATA:  Hypertension.  Recent bowel intussusception EXAM: PORTABLE CHEST 1 VIEW COMPARISON:  November 07, 2015 FINDINGS: Nasogastric tube has been removed. There is patchy atelectasis in the right  base region. Calcification on the right medially is likely due to prior emphysema, stable. No new opacity identified. Heart is upper normal in size with pulmonary vascularity within normal limits. There is atherosclerotic calcification aorta. Bones are osteoporotic. There is superior migration of each humeral head. IMPRESSION: Probable prior empyema on the right medially with calcification. Right base atelectasis. Lungs elsewhere clear. Stable cardiac silhouette. There is aortic atherosclerosis. There is evidence of bilateral chronic rotator cuff tears. Electronically Signed   By: Lowella Grip III M.D.   On: 11/08/2015 09:31   Dg Chest Port 1 View  Result Date: 11/07/2015 CLINICAL DATA:  Nasogastric tube. EXAM: PORTABLE CHEST 1 VIEW COMPARISON:  11/04/2015. FINDINGS: NG tube noted coiled stomach. Cardiomegaly with pulmonary vascular prominence interstitial prominence. No focal infiltrate. Mild bibasilar atelectasis. No  pleural effusion or pneumothorax. IMPRESSION: 1. NG tube noted coiled stomach. 2. Cardiomegaly with pulmonary interstitial prominence consistent with congestive heart failure and interstitial edema. Small left pleural effusion. 3. Low lung volumes with bibasilar atelectasis . Electronically Signed   By: Marcello Moores  Register   On: 11/07/2015 09:00   Dg Chest Port 1 View  Result Date: 11/04/2015 CLINICAL DATA:  Nasogastric tube placement.  Initial encounter. EXAM: PORTABLE CHEST 1 VIEW COMPARISON:  Chest radiograph performed 02/10/2014 FINDINGS: The patient's enteric tube is seen ending overlying the body of the stomach, with the side port about the gastroesophageal junction. This could be advanced several centimeters, as deemed clinically appropriate. The lungs are hypoexpanded. Bibasilar airspace opacities may reflect atelectasis or pneumonia. Small bilateral pleural effusions are suspected. No pneumothorax is seen. The cardiomediastinal silhouette is borderline normal in size. No acute  osseous abnormalities are identified. Clips are noted within the right upper quadrant, reflecting prior cholecystectomy. IMPRESSION: 1. Enteric tube noted ending overlying the body of the stomach, with the side port about the gastroesophageal junction. This could be advanced several centimeters, as deemed clinically appropriate. 2. Lungs hypoexpanded. Bibasilar airspace opacities may reflect atelectasis or pneumonia. Small bilateral pleural effusions suspected. Electronically Signed   By: Garald Balding M.D.   On: 11/04/2015 05:56   Dg Abd 2 Views  Result Date: 11/04/2015 CLINICAL DATA:  Check nasogastric catheter placement EXAM: ABDOMEN - 2 VIEW COMPARISON:  11/03/2015 FINDINGS: Scattered large and small bowel gas is noted. No obstructive changes seen. Bladder is well distended with contrast opacified urine. Left hip replacement is noted. A nasogastric catheter is noted within the stomach just beyond the gastroesophageal junction. No free air is seen. Pleural plaques are noted bilaterally. IMPRESSION: Nasogastric catheter within the stomach. The degree of small bowel dilatation has improved from the prior CT examination. Electronically Signed   By: Inez Catalina M.D.   On: 11/04/2015 08:17   Dg Abd Portable 1v  Result Date: 11/09/2015 CLINICAL DATA:  Small bowel obstruction EXAM: PORTABLE ABDOMEN - 1 VIEW COMPARISON:  November 08, 2015 FINDINGS: Nasogastric tube tip and side port are in proximal stomach. There remain multiple loops of dilated small bowel in a pattern concerning for a degree of obstruction. No free air evident. There is evidence of prior calcified empyema is in the lung base regions. IMPRESSION: Bowel gas pattern remains of concern for a degree of bowel obstruction. No free air. Nasogastric tube tip and side port in proximal stomach. Electronically Signed   By: Lowella Grip III M.D.   On: 11/09/2015 07:01   Dg Abd Portable 1v  Result Date: 11/08/2015 CLINICAL DATA:  NG tube placement  EXAM: PORTABLE ABDOMEN - 1 VIEW COMPARISON:  November 08, 2015 FINDINGS: The NG tube terminates in the distal esophagus. Recommend advancement. Persistent small bowel obstruction. Evaluation for free air is limited on this study but none is definitely seen. Pleural calcifications again identified. Minimal opacity in the lateral right lung is not completely excluded. No other acute abnormalities. IMPRESSION: 1. The NG tube side port is in the midesophagus and the distal tip is above the GE junction. Recommend advancement. 2. Persistent small bowel obstruction. 3. Calcified pleural plaques persists. These results will be called to the ordering clinician or representative by the Radiologist Assistant, and communication documented in the PACS or zVision Dashboard. Electronically Signed   By: Dorise Bullion III M.D   On: 11/08/2015 12:59   Dg Abd Portable 1v  Result Date: 11/08/2015 CLINICAL DATA:  Small-bowel obstruction EXAM: PORTABLE ABDOMEN - 1 VIEW COMPARISON:  11/07/2015 FINDINGS: There is again noted small-bowel obstruction similar to that seen on the prior exam. Nasogastric catheter is not well appreciated on this exam. No definitive free air is seen. Left hip replacement and degenerative change of the lumbar spine are again noted. IMPRESSION: Persistent small bowel obstruction Electronically Signed   By: Inez Catalina M.D.   On: 11/08/2015 09:35   Dg Abd Portable 1v  Result Date: 11/07/2015 CLINICAL DATA:  Abdominal distention. EXAM: PORTABLE ABDOMEN - 1 VIEW COMPARISON:  CT 11/06/2015.  Abdominal series 813 2017, 11/05/2015 FINDINGS: NG tube noted coiled in the stomach. Progressive distention of multiple loops of small bowel are noted. Paucity of colonic gas noted. No pneumatosis noted. No free air identified. Severe degenerative changes and scoliosis lumbar spine. Degenerative changes right hip. Left hip prosthesis. Diffuse osteopenia. Surgical sutures left groin. IMPRESSION: 1. NG tube noted coiled  stomach. 2. Progressive distention of multiple loops of small bowel. Small bowel loops are noted to be dilated up to 4.9 cm. Findings consistent with small-bowel obstruction. Electronically Signed   By: Marcello Moores  Register   On: 11/07/2015 06:43   Dg Abd Portable 1v  Result Date: 11/03/2015 CLINICAL DATA:  Nasogastric tube placement. EXAM: PORTABLE ABDOMEN - 1 VIEW COMPARISON:  CT 11/03/2015 FINDINGS: Nasogastric tube has been placed, tip overlying the level of the proximal stomach and possibly within the hiatal hernia or proximal stomach. Bowel gas pattern shows persistent small bowel dilatation. Contrast is identified within the urinary bladder following CT exam. IMPRESSION: Nasogastric tube tip likely within the proximal stomach or hiatal hernia. Persistent dilatation of small bowel loops. Electronically Signed   By: Nolon Nations M.D.   On: 11/03/2015 17:03    Echocardiogram: 11/07/2015 Study Conclusions  - Left ventricle: The cavity size was normal. Wall thickness was increased in a pattern of mild LVH. Indeterminant diastolic function (atrial fibrillation). Systolic function was severely reduced. The estimated ejection fraction was in the range of 25% to 30%. Diffuse hypokinesis but lateral wall function looked better than other walls. - Aortic valve: There was no stenosis. There was trivial regurgitation. - Mitral valve: Mildly calcified annulus. There was no significant regurgitation. - Left atrium: The atrium was mildly dilated. - Right ventricle: The cavity size was normal. Systolic function was mildly to moderately reduced. - Tricuspid valve: Peak RV-RA gradient (S): 31 mm Hg. - Systemic veins: IVC was not visualized.  Impressions:  - The patient was in atrial fibrillation. Normal LV size with mild LV hypertrophy. EF 25-30%. Lateral wall function looked relatively preserved compared to other walls. Normal RV size with mild to moderately decreased  systolic function. Mild pulmonary hypertension.  Assessment & Plan    1. Preoperative Clearance for Surgical Treatment of Small Bowel Obstruction - admitted with nausea, vomiting, and abdominal pain. CT Imaging showed small bowel obstruction secondary to an ileal to ileal intussusception in the right lower quadrant. NG tube placed on admission and has been replaced this admission due to periods of confusion where the patient has pulled it out.  - while patient is 82yo he is still active for his age and enjoys going to restaurants and visiting friends. Does not engage in regular exercise, but denies any recent chest discomfort or dyspnea with exertion when engaging in these activities. Has known reduced EF thought to be secondary to nonischemic cardiomyopathy and new onset atrial fibrillation this admission. EKG is without acute ischemic changes. - due to age  alone and known cardiomyopathy, he would be high-risk for the procedure, but it appears options are limited if symptoms do not improve with NG tube.  - underwent surgery on 8/16. Placed on low-dose Neo drip which has now been discontinued. Currently on vent for airway protection. Post-op EKG without acute ischemic changes. Sinus tach yesterday and extubation delayed, HR improved this AM.   2. Paroxysmal Atrial Fibrillation - no history of this. Went into atrial fibrillation on 8/12 and has since had repeat episodes of PAF with return to NSR.  TSH and Mg WNL.  - was on IV Cardizem, now discontinued. Currently in NSR. Not on any current cardiac medications as he required pressor support following surgery. Now off pressors. Would use IV Lopressor 2.'5mg'$  Q4H once BP remains stabilized to reduce recurrent AF and transition back to PO once able to tolerate PO's and ok with surgery (on Toprol-XL '25mg'$  daily PTA).  - This patients CHA2DS2-VASc Score and unadjusted Ischemic Stroke Rate (% per year) is equal to 4.8 % stroke rate/year from a score of 4(CHF,  HTN, Age (2)). Reports having frequent falls. Would address the need for long-term anticoagulation following surgery with the patient and his family.   3. Chronic Combined Systolic and Diastolic CHF - Presumed to be nonischemic cardiomyopathy (no ischemia on NST in 2010) per review of Dr. Harrington Challenger' notes. Echocardiogram this admission showsa reduced EF of 25-30% with diffuse HK, decreased from 45% in 2015. PTA medications include Toprol-XL '25mg'$  daily, Prinzide 10-12.'5mg'$  daily, and Lasix 20-'40mg'$  daily (alternating doses on days of the week).  - Overall, he is + 7.4L this admission due to being NPO and receiving IVF, given IV Lasix yesterday with net output of -3.0L. HR improved this AM. Does not appear overly volume overloaded on exam. Would benefit with IV Lasix '40mg'$  for one dose today if BP will allow, currently in low-100's.   4. Aspiration PNA - currently on Unasyn.  - per admitting team  5. Elevated Troponin - values flat at 0.15, 0.10, and 0.10. Likely secondary to demand ischemia in the post-operative setting and with recent atrial fibrillation. Post-op EKG without acute ischemic changes.   Arna Medici , PA-C 8:39 AM 11/11/2015 Pager: [phone]   Patient seen and examined. Agree with assessment and plan. Extubated this am. Converted to sinus rhythm. Tachycardia resolved.  HR now in the 73'Z, BP 329 systolic; stable hemodynamics.  Will f/u ECG.   Troy Sine, MD, [facility] 11/11/2015 3:07 PM

## 2015-11-11 NOTE — Progress Notes (Signed)
Nutrition Follow-up  DOCUMENTATION CODES:   Not applicable  INTERVENTION:  - Nutrition support initiation per Surgery.  - RD will follow-up 8/21.  NUTRITION DIAGNOSIS:   Inadequate oral intake related to inability to eat as evidenced by NPO status. -ongoing  GOAL:   Patient will meet greater than or equal to 90% of their needs -unmet  MONITOR:   Weight trends, Labs, Skin, I & O's, Other (Comment) (nutrition support initiation)  ASSESSMENT:   82 y.o. male with medical history significant of cardiomyopathy, lung lobectomy in 1980s, Barretts esophagus, vertebral compression fracture who developed sudden onset abdominal pain and vomited once while at [facility] today around 11 pm. Pt then had one episode of emesis. Currently not in abdominal pain and has had no further vomiting.  Pt found to have SBO and NGT placed to suction.  8/18 Pt was extubated earlier this AM and estimated nutrition needs updated based on this as well as POD #2. Pt continues with NGT with 100cc drainage at time of RD visit. Pt's weight remains stable since yesterday with Lasix x1 yesterday. Notes indicate likely plan to initiate TPN for prolonged NPO (now 8 days).  Medications reviewed; 1 g IV Mg sulfate x1 dose today, 2 g IV Mg sulfate x1 dose yesterday, PRN Zofran. Labs reviewed; CBGs: 85-109 mg/dL, Cl: 114 mmol/L, creatinine: 1.29 mg/dL, Ca: 7.3 mg/dL, GFR: 46 mL/min.      8/17 - Pt continues with NGT to suction with 100cc drainage present at this time.  - Pt now intubated following procedure yesterday: ex lap enterotomy with removal of bezoar and decompression of distended small bowel and oversewing of compromised jejunal diverticula. - Estimated nutrition needs updated this AM d/t ventilation.  - RT note states MD advised to start weaning.  - Bilateral mittens in place and no family/visitors present at this time.  - Pt off pressors and not on any sedation at this time.  - Pt has now been NPO x7  days.  Patient is currently intubated on ventilator support MV: 10.3 L/min Temp (24hrs), Avg:97.8 F (36.6 C), Min:97.3 F (36.3 C), Max:98.6 F (37 C) Propofol: none  IVF: D5-1/2 NS-20 mEq KCl @ 50 mL/hr (204 kcal).   8/15 - Pt continues to be NPO since admission (5 days).  - NGT was adjusted yesterday and 75cc drainage obtained after adjustment.  - Per notes, pt pulled NGT this AM; spoke with sitter who is at bedside who reports NGT was replaced today ~1100.  - At time of RD visit canister with ~150cc yellow, clear drainage.  - Pt is confused at time of visit.  - Notes from early this AM indicate pt was agitated and aggressive for a short time.  - Did not perform physical assessment at this time with respect to pt's comfort.  IVF: 1/2 NS-10 mEq KCl @ 125 mL/hr.    Diet Order:  Diet NPO time specified  Skin:  Reviewed, no issues  Last BM:  8/17  Height:   Ht Readings from Last 1 Encounters:  11/09/15 5\' 5"  (1.651 m)    Weight:   Wt Readings from Last 1 Encounters:  11/11/15 153 lb (69.4 kg)    Ideal Body Weight:  61.82 kg  BMI:  Body mass index is 25.46 kg/m.  Estimated Nutritional Needs:   Kcal:  1500-1700  Protein:  75-85 grams  Fluid:  1.5-1.7 L/day  EDUCATION NEEDS:   No education needs identified at this time    Jarome Matin, MS, RD,  LDN Inpatient Clinical Dietitian Pager # [phone] After hours/weekend pager # [phone]

## 2015-11-11 NOTE — Progress Notes (Signed)
2 Days Post-Op  Subjective: Awake and calm on vent.  Objective: Vital signs in last 24 hours: Temp:  [98.5 F (36.9 C)-99.3 F (37.4 C)] 98.6 F (37 C) (08/18 0351) Pulse Rate:  [53-124] 115 (08/18 0410) Resp:  [18-28] 22 (08/18 0600) BP: (87-149)/(49-85) 105/58 (08/18 0600) SpO2:  [98 %-100 %] 100 % (08/18 0830) FiO2 (%):  [30 %-40 %] 30 % (08/18 0830) Weight:  [69.4 kg (153 lb)] 69.4 kg (153 lb) (08/18 0330) Last BM Date: 11/10/15  Intake/Output from previous day: 08/17 0701 - 08/18 0700 In: 685 [I.V.:385; IV Piggyback:300] Out: [id number] [Urine:3610; Emesis/NG output:125] Intake/Output this shift: No intake/output data recorded.  PE: General- In NAD Abdomen-soft, distended, quiet, wound clean with honeycomb dressing on  Lab Results:   Recent Labs  11/10/15 0425 11/11/15 0339  WBC 16.7* 12.9*  HGB 12.3* 10.5*  HCT 36.6* 30.5*  PLT 287 302   BMET  Recent Labs  11/10/15 0745 11/10/15 1352 11/11/15 0339  NA 140  --  141  K 4.4 4.3 3.6  CL 117*  --  114*  CO2 18*  --  21*  GLUCOSE 144*  --  98  BUN 22*  --  20  CREATININE 0.98  --  1.29*  CALCIUM 7.3*  --  7.3*   PT/INR No results for input(s): LABPROT, INR in the last 72 hours. Comprehensive Metabolic Panel:    Component Value Date/Time   NA 141 11/11/2015 0339   NA 140 11/10/2015 0745   K 3.6 11/11/2015 0339   K 4.3 11/10/2015 1352   CL 114 (H) 11/11/2015 0339   CL 117 (H) 11/10/2015 0745   CO2 21 (L) 11/11/2015 0339   CO2 18 (L) 11/10/2015 0745   BUN 20 11/11/2015 0339   BUN 22 (H) 11/10/2015 0745   CREATININE 1.29 (H) 11/11/2015 0339   CREATININE 0.98 11/10/2015 0745   CREATININE 1.09 06/30/2015 0949   CREATININE 1.20 (H) 03/16/2015 0936   GLUCOSE 98 11/11/2015 0339   GLUCOSE 144 (H) 11/10/2015 0745   CALCIUM 7.3 (L) 11/11/2015 0339   CALCIUM 7.3 (L) 11/10/2015 0745   AST 10 (L) 11/11/2015 0339   AST 66 (H) 11/03/2015 1430   ALT 12 (L) 11/11/2015 0339   ALT 42 11/03/2015 1430   ALKPHOS  54 11/11/2015 0339   ALKPHOS 137 (H) 11/03/2015 1430   BILITOT 0.9 11/11/2015 0339   BILITOT 1.2 11/03/2015 1430   PROT 4.3 (L) 11/11/2015 0339   PROT 6.1 (L) 11/03/2015 1430   ALBUMIN 1.7 (L) 11/11/2015 0339   ALBUMIN 3.2 (L) 11/03/2015 1430     Studies/Results: Dg Chest Port 1 View  Result Date: 11/10/2015 CLINICAL DATA:  Hypoxia EXAM: PORTABLE CHEST 1 VIEW COMPARISON:  November 09, 2015 FINDINGS: Endotracheal tube tip is 2.1 cm above the carina. Nasogastric tube tip and side port are in the stomach. No pneumothorax. There is a stable nodular appearing opacity in the right lower lobe, likely a granuloma. There is patchy atelectasis in both lower lobes. Foci of calcification are noted along each pleural surface, more on the right than on the left, findings consistent with either previous empyema or asbestos exposure. There is no new opacity. Heart is mildly enlarged with pulmonary vascularity within normal limits. There is atherosclerotic calcification in the aorta. No adenopathy evident. There is degenerative change in each shoulder. IMPRESSION: Tube positions as described without pneumothorax. Bibasilar atelectasis. Areas of pleural calcification bilaterally, more on the right than on the left, stable. Probable granuloma  right lower lobe. Aortic atherosclerosis. Stable cardiac prominence. No frank edema or consolidation evident. Electronically Signed   By: Lowella Grip III M.D.   On: 11/10/2015 08:42   Dg Chest Port 1 View  Result Date: 11/09/2015 CLINICAL DATA:  Intubation. EXAM: PORTABLE CHEST 1 VIEW COMPARISON:  11/08/2015 FINDINGS: Endotracheal tube has its tip 1 cm above the carina. Nasogastric tube enters the stomach. The heart is enlarged. There is aortic atherosclerosis. There is infiltrate and/or volume loss in the lower lungs bilaterally. Some of the pulmonary opacities are somewhat nodular and should be followed on subsequent exams. No effusions. IMPRESSION: Endotracheal tube tip 1  cm above the carina. Nasogastric tube enters stomach. Infiltrate and/or volume loss at both lung bases. Follow-up nodular shadows after treatment. Cardiomegaly.  Aortic atherosclerosis. Electronically Signed   By: Nelson Chimes M.D.   On: 11/09/2015 18:21    Anti-infectives: Anti-infectives    Start     Dose/Rate Route Frequency Ordered Stop   11/10/15 1600  Ampicillin-Sulbactam (UNASYN) 3 g in sodium chloride 0.9 % 100 mL IVPB     3 g 100 mL/hr over 60 Minutes Intravenous Every 6 hours 11/10/15 1152 11/11/15 0453   11/10/15 1000  Ampicillin-Sulbactam (UNASYN) 3 g in sodium chloride 0.9 % 100 mL IVPB  Status:  Discontinued     3 g 100 mL/hr over 60 Minutes Intravenous Every 6 hours 11/10/15 0910 11/10/15 1151   11/07/15 1600  Ampicillin-Sulbactam (UNASYN) 3 g in sodium chloride 0.9 % 100 mL IVPB  Status:  Discontinued     3 g 100 mL/hr over 60 Minutes Intravenous Every 6 hours 11/07/15 1024 11/09/15 1731   11/06/15 2200  Ampicillin-Sulbactam (UNASYN) 3 g in sodium chloride 0.9 % 100 mL IVPB  Status:  Discontinued     3 g 100 mL/hr over 60 Minutes Intravenous Every 12 hours 11/06/15 1020 11/07/15 1024   11/03/15 1930  Ampicillin-Sulbactam (UNASYN) 3 g in sodium chloride 0.9 % 100 mL IVPB  Status:  Discontinued     3 g 100 mL/hr over 60 Minutes Intravenous Every 6 hours 11/03/15 1848 11/06/15 1020   11/03/15 1600  cefTRIAXone (ROCEPHIN) 1 g in dextrose 5 % 50 mL IVPB     1 g 100 mL/hr over 30 Minutes Intravenous  Once 11/03/15 1558 11/03/15 1636   11/03/15 1600  azithromycin (ZITHROMAX) 500 mg in dextrose 5 % 250 mL IVPB     500 mg 250 mL/hr over 60 Minutes Intravenous  Once 11/03/15 1558 11/03/15 1858      Assessment SBO due to bezoar in the terminal ileum (probable metamucil) S/p EXPLORATORY LAPAROTOMY (N/A)enterotomy and removal of bezoar with decompression of distended small bowel and oversewing of compromised jejunal diverticula reacting to distention,11/09/15, Dr. Lucinda Dell clean; has postop VDRF; WBC coming down   LOS: 8 days   Plan: Continue ng.  Would start TPN for nutritional support as he is at risk of a prolonged ileus.   Judge Duque J 11/11/2015

## 2015-11-11 NOTE — Progress Notes (Addendum)
PULMONARY / CRITICAL CARE MEDICINE   Name: [name] MRN: [id number] DOB: Jan 10, 1922    ADMISSION DATE:  11/03/2015 CONSULTATION DATE:  8/16  REFERRING MD:  Dr. Tyrell Antonio  CHIEF COMPLAINT:  VDRF post op  HISTORY OF PRESENT ILLNESS:   82 year old male with PMH as below, which is significant for systolic/diastolic CHF, NICM, and Barrett's esophagus. He was admitted 8/10 for small bowel obstruction after an episode of vomiting at the [facility]. CT abdomen demonstrated intussusception vs obstruction of distal ileum. Also some concern for aspiration PNA started on unasyn. SBO was initially treated conservatively with decompression via NGT. Hospital course complicated by AF RVR requiring diltiazem infusion. 8/16 he was taken to OR for ex-lap in setting SBO and bezoar was removed from terminal ileum. Postoperatively he was taken to ICU on ventilator.    SUBJECTIVE:  Had sinus tachycardia during PST and was tachypneic. ST responded well to lopressor eventually.  Some in and out of sinus tachycardia since this am, regardless of PST. Comfortable on PST.  (-) subjective complaints.   VITAL SIGNS: BP (!) 105/58   Pulse (!) 115   Temp 98.6 F (37 C) (Axillary)   Resp (!) 22   Ht 5\' 5"  (1.651 m)   Wt 69.4 kg (153 lb)   SpO2 100%   BMI 25.46 kg/m   HEMODYNAMICS:    VENTILATOR SETTINGS: Vent Mode: PSV FiO2 (%):  [30 %-40 %] 30 % Set Rate:  [18 bmp] 18 bmp Vt Set:  [490 mL] 490 mL PEEP:  [5 cmH20] 5 cmH20 Pressure Support:  [5 cmH20] 5 cmH20 Plateau Pressure:  [12 cmH20-19 cmH20] 14 cmH20  INTAKE / OUTPUT: I/O last 3 completed shifts: In: 1945.9 [I.V.:1595.9; IV Piggyback:350] Out: [id number] [Urine:3875; Emesis/NG output:275]  PHYSICAL EXAMINATION: General:  Elderly male in NAD on vent Neuro:  Awake, follows commands. Not in distress.  HEENT:  [place]/AT, PERRL, no JVD Cardiovascular:  RRR, no MRG Lungs:  Some crackles at bases.  Abdomen:  Soft, non-distedned. Dec  BS Musculoskeletal:  No acute deformity Skin:  Grossly intact.  LABS:  BMET  Recent Labs Lab 11/09/15 2050 11/10/15 0745 11/10/15 1352 11/11/15 0339  NA 140 140  --  141  K 4.6 4.4 4.3 3.6  CL 118* 117*  --  114*  CO2 16* 18*  --  21*  BUN 25* 22*  --  20  CREATININE 0.98 0.98  --  1.29*  GLUCOSE 122* 144*  --  98    Electrolytes  Recent Labs Lab 11/09/15 2050 11/10/15 0745 11/10/15 1352 11/11/15 0339  CALCIUM 7.2* 7.3*  --  7.3*  MG  --  1.6* 2.1 1.9  PHOS  --  3.1  --  3.4    CBC  Recent Labs Lab 11/09/15 0315 11/09/15 1526 11/10/15 0425 11/11/15 0339  WBC 14.5*  --  16.7* 12.9*  HGB 12.4* 11.2* 12.3* 10.5*  HCT 37.0* 33.0* 36.6* 30.5*  PLT 262  --  287 302    Coag's No results for input(s): APTT, INR in the last 168 hours.  Sepsis Markers  Recent Labs Lab 11/09/15 1803 11/09/15 2020 11/10/15 0745 11/11/15 0339  LATICACIDVEN 0.7 0.8  --   --   PROCALCITON  --   --  1.07 1.00    ABG  Recent Labs Lab 11/09/15 1526 11/09/15 1920  PHART 7.352 7.332*  PCO2ART 35.1 29.8*  PO2ART 136.0* 87.9    Liver Enzymes  Recent Labs Lab 11/11/15 0339  AST 10*  ALT 12*  ALKPHOS 54  BILITOT 0.9  ALBUMIN 1.7*    Cardiac Enzymes  Recent Labs Lab 11/09/15 1803 11/10/15 0119 11/10/15 0425  TROPONINI 0.15* 0.10* 0.10*    Glucose  Recent Labs Lab 11/10/15 1141 11/10/15 1533 11/10/15 2005 11/11/15 0036 11/11/15 0314 11/11/15 0727  GLUCAP 143* 135* 103* 109* 103* 85    Imaging No results found.   STUDIES:  CT abd 8/13> Partial small bowel obstruction similar to the prior CT. What was felt be an ileal intussusception on the prior study now appears more consistent with 2 adjacent densities, consistent with stones or dense stool, in the distal ileum. These may or may not be the cause of the obstruction. Bowel distal to these densities is decompressed. There is currently no convincing intussusception. Minimal pleural effusions and  dependent lung base atelectasis, increased from the prior study. Densely calcified pleural plaque at the lung bases is stable.  CULTURES:   ANTIBIOTICS: Unasyn 8/10>  SIGNIFICANT EVENTS: 8/10 admit 8/16 to OR bezoar removed ex lap. Kept intubated  LINES/TUBES: ETT 8/16 >>> ARt line 8/16 >>>  DISCUSSION: 82 year old male admitted for SBO and aspiration. Developed PAF inpatient. To OR 8/16 for ex-lap and had Bezoar removed. Post op on vent in ICU. Plan to support overnight and hopefully extubate in AM.   ASSESSMENT / PLAN:  PULMONARY A: Inability to protect airway in post-op setting Acute hypoxemic respiratory failure secondary to aspiration PNA (not the reason he is on vent)  P:   Doing well on PST. RSBI 40. Sinus tachycardia is on and off regardless whether he is in full vent support or PST. Will give lopressor for sinus tachy (in 120s). Anticipate we can extubate this am.  Vent bundle  CARDIOVASCULAR A:  Shock in perioperative setting, likely hypovolemic (now off pressors). Resolved.  Paroxysmal atrial fibrillation (CHA2DS2-VASc Score 4), currently sinus tachycardia, on and off.  Chronic systolic/diastolic CHF (LVEF newly 0000000 down from 45) Troponin elevation - no ischemic changes on EKG  P:  Pt responded well to lopressor 5 mg IV x 1 dose on 8/17.  Will start lopressor 2.5 mg IV q4 as long as BP is Normal Telemetry DC diltiazem and home anti-hypertensive's for now. Keep euvolemic. Diuresced well last 24 hrs.  Risk/benefit of long term anticoagulation needs to be addressed with family.    RENAL A:   AKI 2/2 diuresis  P:   Got lasix 40 mg IV on 8/17 and is (-) 3L overnight. Observe creat. Hold off on diuresis for now.   GASTROINTESTINAL A:   Bowel obstruction secondary to Bezoar, now s/p ex-lap and removal 8/16 Barrett's esophagus  Malnutrition, severe  P:   Management per surgery. To start TPN NPO Pepcid BID  HEMATOLOGIC A:   No acute issues  P:   Repeat BMP  INFECTIOUS A:   Suspected aspiration PNA/ Possible abd infection  P:   Continue Unasyn for now. PCT is elevated and trending down. Plan for 10d abx for now.  No cultures to follow  ENDOCRINE A:   No acute issues  P:   Follow glucose on chemistries  NEUROLOGIC A:   Acute encephalopathy, resolved.   P:   RASS goal: 0 PRN fentanyl  Addendum 8/18 12 nn : pt extubated and tolerating it well. Will transfer to [facility] in AM. PCCM off on 8/19. Dr. Tyrell Antonio [facility] aware.   FAMILY  - Updates: Family updated regarding over all condition and prognosis.   -  Inter-disciplinary family meet or Palliative Care meeting due by:  8/24  Critical care time spent on this patient today : 35 minutes.   Monica Becton, MD 11/11/2015, 8:44 AM [facility] Pager [phone] After 3 pm or if no answer, call [phone]

## 2015-11-12 DIAGNOSIS — I248 Other forms of acute ischemic heart disease: Secondary | ICD-10-CM

## 2015-11-12 LAB — BASIC METABOLIC PANEL
ANION GAP: 9 (ref 5–15)
BUN: 20 mg/dL (ref 6–20)
CHLORIDE: 114 mmol/L — AB (ref 101–111)
CO2: 22 mmol/L (ref 22–32)
CREATININE: 1.07 mg/dL (ref 0.61–1.24)
Calcium: 7.8 mg/dL — ABNORMAL LOW (ref 8.9–10.3)
GFR calc non Af Amer: 58 mL/min — ABNORMAL LOW (ref >60)
Glucose, Bld: 71 mg/dL (ref 65–99)
Potassium: 3.8 mmol/L (ref 3.5–5.1)
Sodium: 145 mmol/L (ref 135–145)

## 2015-11-12 LAB — GLUCOSE, CAPILLARY
GLUCOSE-CAPILLARY: 72 mg/dL (ref 65–99)
GLUCOSE-CAPILLARY: 138 mg/dL — AB (ref 65–99)
Glucose-Capillary: 69 mg/dL (ref 65–99)
Glucose-Capillary: 114 mg/dL — ABNORMAL HIGH (ref 65–99)
Glucose-Capillary: 103 mg/dL — ABNORMAL HIGH (ref 65–99)
Glucose-Capillary: 109 mg/dL — ABNORMAL HIGH (ref 65–99)

## 2015-11-12 LAB — CBC
HEMATOCRIT: 33.3 % — AB (ref 39.0–52.0)
HEMOGLOBIN: 11.1 g/dL — AB (ref 13.0–17.0)
MCH: 30.6 pg (ref 26.0–34.0)
MCHC: 33.3 g/dL (ref 30.0–36.0)
MCV: 91.7 fL (ref 78.0–100.0)
Platelets: 332 10*3/uL (ref 150–400)
RBC: 3.63 MIL/uL — ABNORMAL LOW (ref 4.22–5.81)
RDW: 14.7 % (ref 11.5–15.5)
WBC: 12.1 10*3/uL — ABNORMAL HIGH (ref 4.0–10.5)

## 2015-11-12 LAB — PROCALCITONIN: Procalcitonin: 0.38 ng/mL

## 2015-11-12 MED ORDER — DEXTROSE-NACL 5-0.9 % IV SOLN
INTRAVENOUS | Status: DC
  Administered 2015-11-12: 09:00:00 via INTRAVENOUS

## 2015-11-12 MED ORDER — FENTANYL CITRATE (PF) 100 MCG/2ML IJ SOLN
12.5000 ug | INTRAMUSCULAR | Status: DC | PRN
  Administered 2015-11-15: 12.5 ug via INTRAVENOUS
  Filled 2015-11-12: qty 2

## 2015-11-12 MED ORDER — NYSTATIN 100000 UNIT/ML MT SUSP: 5.0000 mL | Freq: Four times a day (QID) | OROMUCOSAL | Status: DC

## 2015-11-12 MED ORDER — SODIUM CHLORIDE 0.9 % IV SOLN
3.0000 g | Freq: Three times a day (TID) | INTRAVENOUS | Status: DC
  Administered 2015-11-12 – 2015-11-15 (×10): 3 g via INTRAVENOUS
  Filled 2015-11-12 (×10): qty 3

## 2015-11-12 MED ORDER — NYSTATIN 100000 UNIT/GM EX POWD: Freq: Three times a day (TID) | CUTANEOUS | Status: DC | PRN

## 2015-11-12 MED ORDER — HALOPERIDOL LACTATE 5 MG/ML IJ SOLN: 0.5000 mg | Freq: Four times a day (QID) | INTRAMUSCULAR | Status: DC | PRN

## 2015-11-12 NOTE — Progress Notes (Signed)
RN entered the room to find the patient slid down in the bed with both legs draped over the side rail closest to the door. The patients honeycomb abdominal dressing was found in the bed with the patient. Patient has been increasingly confused and agitated throughout the night. Wrist restraints and mits in place bilaterally to protect midline abdominal incision, NG tube, IV sites, and foley catheter.  Abdominal incision assessed and appears intact with staples in place and no drainage noted at this time. The patient was repositioned back in bed. Will continue to monitor.

## 2015-11-12 NOTE — Progress Notes (Signed)
3 Days Post-Op  Subjective: Confused. Feels like he needs to go to hospital.  Objective: Vital signs in last 24 hours: Temp:  [96.7 F (35.9 C)-98.6 F (37 C)] 97.2 F (36.2 C) (08/19 0800) Pulse Rate:  [49-110] 106 (08/19 0700) Resp:  [17-24] 20 (08/19 0700) BP: (112-159)/(42-79) 147/79 (08/19 0700) SpO2:  [100 %] 100 % (08/19 0700) Weight:  [69.2 kg (152 lb 8.9 oz)] 69.2 kg (152 lb 8.9 oz) (08/19 0400) Last BM Date: 11/11/15  Intake/Output from previous day: 08/18 0701 - 08/19 0700 In: 260 [I.V.:160; IV Piggyback:100] Out: 1060 [Urine:840; Emesis/NG output:220] Intake/Output this shift: No intake/output data recorded.  Resp: clear to auscultation bilaterally Cardio: regular rate and rhythm GI: soft, nontender. incision looks good. several bm's  Lab Results:   Recent Labs  11/10/15 0425 11/11/15 0339  WBC 16.7* 12.9*  HGB 12.3* 10.5*  HCT 36.6* 30.5*  PLT 287 302   BMET  Recent Labs  11/10/15 0745 11/10/15 1352 11/11/15 0339  NA 140  --  141  K 4.4 4.3 3.6  CL 117*  --  114*  CO2 18*  --  21*  GLUCOSE 144*  --  98  BUN 22*  --  20  CREATININE 0.98  --  1.29*  CALCIUM 7.3*  --  7.3*   PT/INR No results for input(s): LABPROT, INR in the last 72 hours. ABG  Recent Labs  11/09/15 1526 11/09/15 1920  PHART 7.352 7.332*  HCO3 19.6* 15.4*    Studies/Results: Dg Chest Port 1 View  Result Date: 11/10/2015 CLINICAL DATA:  Hypoxia EXAM: PORTABLE CHEST 1 VIEW COMPARISON:  November 09, 2015 FINDINGS: Endotracheal tube tip is 2.1 cm above the carina. Nasogastric tube tip and side port are in the stomach. No pneumothorax. There is a stable nodular appearing opacity in the right lower lobe, likely a granuloma. There is patchy atelectasis in both lower lobes. Foci of calcification are noted along each pleural surface, more on the right than on the left, findings consistent with either previous empyema or asbestos exposure. There is no new opacity. Heart is mildly  enlarged with pulmonary vascularity within normal limits. There is atherosclerotic calcification in the aorta. No adenopathy evident. There is degenerative change in each shoulder. IMPRESSION: Tube positions as described without pneumothorax. Bibasilar atelectasis. Areas of pleural calcification bilaterally, more on the right than on the left, stable. Probable granuloma right lower lobe. Aortic atherosclerosis. Stable cardiac prominence. No frank edema or consolidation evident. Electronically Signed   By: Lowella Grip III M.D.   On: 11/10/2015 08:42    Anti-infectives: Anti-infectives    Start     Dose/Rate Route Frequency Ordered Stop   11/12/15 0800  Ampicillin-Sulbactam (UNASYN) 3 g in sodium chloride 0.9 % 100 mL IVPB     3 g 100 mL/hr over 60 Minutes Intravenous Every 8 hours 11/12/15 0751     11/10/15 1600  Ampicillin-Sulbactam (UNASYN) 3 g in sodium chloride 0.9 % 100 mL IVPB     3 g 100 mL/hr over 60 Minutes Intravenous Every 6 hours 11/10/15 1152 11/11/15 0453   11/10/15 1000  Ampicillin-Sulbactam (UNASYN) 3 g in sodium chloride 0.9 % 100 mL IVPB  Status:  Discontinued     3 g 100 mL/hr over 60 Minutes Intravenous Every 6 hours 11/10/15 0910 11/10/15 1151   11/07/15 1600  Ampicillin-Sulbactam (UNASYN) 3 g in sodium chloride 0.9 % 100 mL IVPB  Status:  Discontinued     3 g 100 mL/hr over  60 Minutes Intravenous Every 6 hours 11/07/15 1024 11/09/15 1731   11/06/15 2200  Ampicillin-Sulbactam (UNASYN) 3 g in sodium chloride 0.9 % 100 mL IVPB  Status:  Discontinued     3 g 100 mL/hr over 60 Minutes Intravenous Every 12 hours 11/06/15 1020 11/07/15 1024   11/03/15 1930  Ampicillin-Sulbactam (UNASYN) 3 g in sodium chloride 0.9 % 100 mL IVPB  Status:  Discontinued     3 g 100 mL/hr over 60 Minutes Intravenous Every 6 hours 11/03/15 1848 11/06/15 1020   11/03/15 1600  cefTRIAXone (ROCEPHIN) 1 g in dextrose 5 % 50 mL IVPB     1 g 100 mL/hr over 30 Minutes Intravenous  Once 11/03/15 1558  11/03/15 1636   11/03/15 1600  azithromycin (ZITHROMAX) 500 mg in dextrose 5 % 250 mL IVPB     500 mg 250 mL/hr over 60 Minutes Intravenous  Once 11/03/15 1558 11/03/15 1858      Assessment/Plan: s/p Procedure(s): EXPLORATORY LAPAROTOMY (N/A) Advance diet. Start clears today D/c ng D/c foley Continue to restrain for safety and monitor in icu  LOS: 9 days    TOTH III,Nickalaus Crooke S 11/12/2015

## 2015-11-12 NOTE — Progress Notes (Signed)
Pharmacy Antibiotic Note  [name] is a 82 y.o. male admitted on 11/03/2015 with Aspiration PNA, received Rocephin/Zmax x1 in ED.  Pharmacy has been consulted for Unasyn dosing.  PMH cardiomyopathy, lung lobectomy 1980s  Completed 7 full days Tx on 8/17, however BL infiltrates on 8/16 CXR, so extending course to 10 days.  Note patient missed several doses on 8/18 as pharmacy was not consulted to extend course after setting 7 (full) day stop date.  Plan:  Resume Unasyn at 3 g IV q8 hr d/t worsening renal function; borderline for q12 hr dosing.  F/u SCr, clinical course  Height: 5\' 5"  (165.1 cm) Weight: 152 lb 8.9 oz (69.2 kg) IBW/kg (Calculated) : 61.5  Temp (24hrs), Avg:98 F (36.7 C), Min:96.7 F (35.9 C), Max:98.6 F (37 C)   Recent Labs Lab 11/06/15 0323 11/07/15 0725 11/08/15 0746 11/09/15 0315 11/09/15 1803 11/09/15 2020 11/09/15 2050 11/10/15 0425 11/10/15 0745 11/11/15 0339  WBC 16.1* 16.4*  --  14.5*  --   --   --  16.7*  --  12.9*  CREATININE 1.36* 1.05 1.12 0.96  --   --  0.98  --  0.98 1.29*  LATICACIDVEN  --   --   --   --  0.7 0.8  --   --   --   --     Estimated Creatinine Clearance: 31.8 mL/min (by C-G formula based on SCr of 1.29 mg/dL).    Allergies  Allergen Reactions  . Ciprofloxacin Other (See Comments)    Reaction:  Unknown   . Penicillins Rash and Other (See Comments)    Tolerated Unasyn 11/03/15 Has patient had a PCN reaction causing immediate rash, facial/tongue/throat swelling, SOB or lightheadedness with hypotension: No Has patient had a PCN reaction causing severe rash involving mucus membranes or skin necrosis: No Has patient had a PCN reaction that required hospitalization No Has patient had a PCN reaction occurring within the last 10 years: No If all of the above answers are "NO", then may proceed with Cephalosporin use.    Antimicrobials this admission: R/Z x 1 8/10 Unasyn 8/10 PM >>   Dose adjustments this admission: 8/13  amps/sulb to 3gm q12h for inc in SCr 8/14: Change Unasyn to 3g IV q6 for CrCl > 30 8/19: Change Unasyn to 3g IV q8 for worsening CrCl  Microbiology results: 8/10 MRSA PCR: neg  Thank you for allowing pharmacy to be a part of this patient's care.  Reuel Boom, PharmD, BCPS Pager: [phone] 11/12/2015, 7:39 AM

## 2015-11-12 NOTE — Progress Notes (Signed)
PROGRESS NOTE    [name]  [id number] DOB: Aug 03, 1921 DOA: 11/03/2015  PCP: Jenny Reichmann, MD   Brief Narrative:  [name] is a 82 y.o. male with medical history significant of cardiomyopathy, lung lobectomy in 1980s, Barretts esophagus, vertebral compression fracture who is quite independent at baseline and developed sudden onset abdominal pain and vomited once while at [employer] around 11 pm. No fever or chills.  Found to have intussusception of ileum. Also found to be hypoxic and thought to have aspirated while vomiting. While being in the hospital he has developed new onset paroxysmal  A-fib with RVR. HR has been difficult to control and is being managed with IV Cardizem in addition to IV Lopressor. Also found to have a new drop in EF from 45 to 25%.   Subjective: Alert, wants to get out. Denies pain.  Had multiple BM yesterday   Assessment & Plan:   Principal Problem:   Small bowel obstruction  - due to Bezoar of terminal ileum , near total obstruction proximal bowel distention with early near perforation of multiple jejunal diverticula. -  surgery following-  -enterotomy and removal of bezoar on 8-16. -has Ng-tube, had multiple BM overnight. Surgery to decided if NG can be remove and diet started.   Acute hypoxic resp failure - ? Aspiration pneumonia- started on Unasyn in ER and eventually was weaned to room air-  repeat CXR showed bibasilar opacities with hypo expended lungs- - cont Unasyn and O2 day 7, need 3 more days.  - PRN nebs - Xopenex as he has A-fib  Acute delirium - PRN Haldol and sitter- he does better when family is at bedside -sitter at bedside. Hopefully NG can be removed, and will get Sitter at bedside.   Hypoglycemia; D 5 IV fluids. If diet started ok to discontnue fluids.   Diarrhea; if persist might need to check for C diff.   A-fib with RVR - no prior history of this - on  IV Metoprolol-  -Cardizem was discontinue.  - ECHO  ordered-EF 25-30% -TSH normal./   Systolic CHF - chronic - previously  EF 45 %, gr 1 d CHF - ECHO shows a new finding of EF of 25-30% with mild to mod right heart failure as well- mild Pulm HTN - on B blocker at this time- will need ACE I / ARB as tolerated if he recovers from SBO -cardiology was consulted for pre op evaluation.   AKI- pleural effusions  - due to dehydration/ hypotension? - Cr increased today. Follow trend. Started iv fluids.   Hypokalemia Resolved.   Hypernatremia -resolved with changed IVF to lower sodium concentration  HTN - Lasix, Lisinopril/ HCTZ on hold - IV Lopressor   Gastritis/ GERD? - Pepcid IV  Very hard of hearing - has appt for hearing aid on Friday   DVT prophylaxis: Lovenox Code Status: Full code Family Communication: son, Marya Amsler Disposition Plan: to be determined Consultants:   gen surgery Procedures:    Antimicrobials:  Anti-infectives    Start     Dose/Rate Route Frequency Ordered Stop   11/10/15 1600  Ampicillin-Sulbactam (UNASYN) 3 g in sodium chloride 0.9 % 100 mL IVPB     3 g 100 mL/hr over 60 Minutes Intravenous Every 6 hours 11/10/15 1152 11/11/15 0453   11/10/15 1000  Ampicillin-Sulbactam (UNASYN) 3 g in sodium chloride 0.9 % 100 mL IVPB  Status:  Discontinued     3 g 100 mL/hr over 60 Minutes Intravenous  Every 6 hours 11/10/15 0910 11/10/15 1151   11/07/15 1600  Ampicillin-Sulbactam (UNASYN) 3 g in sodium chloride 0.9 % 100 mL IVPB  Status:  Discontinued     3 g 100 mL/hr over 60 Minutes Intravenous Every 6 hours 11/07/15 1024 11/09/15 1731   11/06/15 2200  Ampicillin-Sulbactam (UNASYN) 3 g in sodium chloride 0.9 % 100 mL IVPB  Status:  Discontinued     3 g 100 mL/hr over 60 Minutes Intravenous Every 12 hours 11/06/15 1020 11/07/15 1024   11/03/15 1930  Ampicillin-Sulbactam (UNASYN) 3 g in sodium chloride 0.9 % 100 mL IVPB  Status:  Discontinued     3 g 100 mL/hr over 60 Minutes Intravenous Every 6 hours 11/03/15 1848  11/06/15 1020   11/03/15 1600  cefTRIAXone (ROCEPHIN) 1 g in dextrose 5 % 50 mL IVPB     1 g 100 mL/hr over 30 Minutes Intravenous  Once 11/03/15 1558 11/03/15 1636   11/03/15 1600  azithromycin (ZITHROMAX) 500 mg in dextrose 5 % 250 mL IVPB     500 mg 250 mL/hr over 60 Minutes Intravenous  Once 11/03/15 1558 11/03/15 1858       Objective: Vitals:   11/12/15 0300 11/12/15 0400 11/12/15 0500 11/12/15 0600  BP: (!) 155/70 (!) 145/63 (!) 152/60 (!) 159/63  Pulse: 75 65 73 74  Resp:  17 (!) 21 (!) 22  Temp:  98.5 F (36.9 C)    TempSrc:  Axillary    SpO2: 100% 100% 100% 100%  Weight:  69.2 kg (152 lb 8.9 oz)    Height:        Intake/Output Summary (Last 24 hours) at 11/12/15 0709 Last data filed at 11/12/15 [id number]  Gross per 24 hour  Intake              260 ml  Output             1060 ml  Net             -800 ml   Filed Weights   11/10/15 0407 11/11/15 0330 11/12/15 0400  Weight: 70.1 kg (154 lb 8.7 oz) 69.4 kg (153 lb) 69.2 kg (152 lb 8.9 oz)    Examination: General exam: Appears comfortable  HEENT: PERRLA, oral mucosa moist, no sclera icterus or thrush Respiratory system: Clear to auscultation. Respiratory effort normal. Cardiovascular system: S1 & S2 heard, RRR.  No murmurs  Gastrointestinal system: Abdomen soft, non-tender, nondistended,  No organomegaly Central nervous system: Alert and oriented to place. No focal neurological deficits. Extremities: No cyanosis, clubbing or edema Skin: No rashes or ulcers Psychiatry:  Mood & affect appropriate.     Data Reviewed: I have personally reviewed following labs and imaging studies  CBC:  Recent Labs Lab 11/06/15 0323 11/07/15 0725 11/09/15 0315 11/09/15 1526 11/10/15 0425 11/11/15 0339  WBC 16.1* 16.4* 14.5*  --  16.7* 12.9*  NEUTROABS 14.4*  --   --   --   --  10.0*  HGB 12.3* 12.7* 12.4* 11.2* 12.3* 10.5*  HCT 37.0* 37.3* 37.0* 33.0* 36.6* 30.5*  MCV 91.4 89.7 89.2  --  89.1 88.4  PLT 229 245 262  --  287  [id number]   Basic Metabolic Panel:  Recent Labs Lab 11/07/15 0725 11/08/15 0746 11/09/15 0315 11/09/15 1526 11/09/15 2050 11/10/15 0745 11/10/15 1352 11/11/15 0339  NA 140 141 140 142 140 140  --  141  K 3.1* 4.9 5.2* 5.5* 4.6 4.4 4.3 3.6  CL 109 111  111  --  118* 117*  --  114*  CO2 24 25 23   --  16* 18*  --  21*  GLUCOSE 136* 99 89  --  122* 144*  --  98  BUN 31* 28* 23*  --  25* 22*  --  20  CREATININE 1.05 1.12 0.96  --  0.98 0.98  --  1.29*  CALCIUM 8.2* 8.0* 8.1*  --  7.2* 7.3*  --  7.3*  MG 1.9  --   --   --   --  1.6* 2.1 1.9  PHOS  --   --   --   --   --  3.1  --  3.4   GFR: Estimated Creatinine Clearance: 31.8 mL/min (by C-G formula based on SCr of 1.29 mg/dL). Liver Function Tests:  Recent Labs Lab 11/11/15 0339  AST 10*  ALT 12*  ALKPHOS 54  BILITOT 0.9  PROT 4.3*  ALBUMIN 1.7*   No results for input(s): LIPASE, AMYLASE in the last 168 hours. No results for input(s): AMMONIA in the last 168 hours. Coagulation Profile: No results for input(s): INR, PROTIME in the last 168 hours. Cardiac Enzymes:  Recent Labs Lab 11/09/15 1803 11/10/15 0119 11/10/15 0425  TROPONINI 0.15* 0.10* 0.10*   BNP (last 3 results) No results for input(s): PROBNP in the last 8760 hours. HbA1C: No results for input(s): HGBA1C in the last 72 hours. CBG:  Recent Labs Lab 11/11/15 1213 11/11/15 1551 11/11/15 2031 11/11/15 2316 11/12/15 0434  GLUCAP 75 77 78 73 72   Lipid Profile:  Recent Labs  11/09/15 1803  TRIG 123   Thyroid Function Tests: No results for input(s): TSH, T4TOTAL, FREET4, T3FREE, THYROIDAB in the last 72 hours. Anemia Panel: No results for input(s): VITAMINB12, FOLATE, FERRITIN, TIBC, IRON, RETICCTPCT in the last 72 hours. Urine analysis:    Component Value Date/Time   COLORURINE YELLOW 11/03/2015 [place] 11/03/2015 1420   LABSPEC 1.038 (H) 11/03/2015 1420   PHURINE 6.0 11/03/2015 1420   GLUCOSEU NEGATIVE 11/03/2015 1420    HGBUR NEGATIVE 11/03/2015 1420   BILIRUBINUR NEGATIVE 11/03/2015 1420   BILIRUBINUR neg 08/18/2013 0848   KETONESUR NEGATIVE 11/03/2015 1420   PROTEINUR NEGATIVE 11/03/2015 1420   UROBILINOGEN 0.2 11/30/2013 0804   NITRITE NEGATIVE 11/03/2015 1420   LEUKOCYTESUR NEGATIVE 11/03/2015 1420   Sepsis Labs: @LABRCNTIP (procalcitonin:4,lacticidven:4) ) Recent Results (from the past 240 hour(s))  MRSA PCR Screening     Status: None   Collection Time: 11/03/15  9:50 PM  Result Value Ref Range Status   MRSA by PCR NEGATIVE NEGATIVE Final    Comment:        The GeneXpert MRSA Assay (FDA approved for NASAL specimens only), is one component of a comprehensive MRSA colonization surveillance program. It is not intended to diagnose MRSA infection nor to guide or monitor treatment for MRSA infections.          Radiology Studies: Dg Chest Port 1 View  Result Date: 11/10/2015 CLINICAL DATA:  Hypoxia EXAM: PORTABLE CHEST 1 VIEW COMPARISON:  November 09, 2015 FINDINGS: Endotracheal tube tip is 2.1 cm above the carina. Nasogastric tube tip and side port are in the stomach. No pneumothorax. There is a stable nodular appearing opacity in the right lower lobe, likely a granuloma. There is patchy atelectasis in both lower lobes. Foci of calcification are noted along each pleural surface, more on the right than on the left, findings consistent with either previous empyema or asbestos  exposure. There is no new opacity. Heart is mildly enlarged with pulmonary vascularity within normal limits. There is atherosclerotic calcification in the aorta. No adenopathy evident. There is degenerative change in each shoulder. IMPRESSION: Tube positions as described without pneumothorax. Bibasilar atelectasis. Areas of pleural calcification bilaterally, more on the right than on the left, stable. Probable granuloma right lower lobe. Aortic atherosclerosis. Stable cardiac prominence. No frank edema or consolidation evident.  Electronically Signed   By: Lowella Grip III M.D.   On: 11/10/2015 08:42      Scheduled Meds: . antiseptic oral rinse  7 mL Mouth Rinse q12n4p  . chlorhexidine  15 mL Mouth Rinse BID  . enoxaparin (LOVENOX) injection  30 mg Subcutaneous Q24H  . famotidine (PEPCID) IV  20 mg Intravenous Q24H  . metoprolol  2.5 mg Intravenous Q4H   Continuous Infusions: . phenylephrine (NEO-SYNEPHRINE) Adult infusion Stopped (11/10/15 0350)     LOS: 9 days    Time spent in minutes: [place], Shantoria Ellwood A, MD [facility] [id number] [email or web address] Password [email or web address] 11/12/2015, 7:09 AM

## 2015-11-12 NOTE — Progress Notes (Signed)
Primary cardiologist: Dr. Dorris Carnes  Seen for followup: Perioperative assessment, PAF, cardiomyopathy  Subjective:    Awake and calm this morning. Nursing notes reviewed indicating confusion overnight. Denies active chest pain or shortness of breath.  Objective:   Temp:  [96.7 F (35.9 C)-98.6 F (37 C)] 98.5 F (36.9 C) (08/19 0400) Pulse Rate:  [49-110] 106 (08/19 0700) Resp:  [17-24] 20 (08/19 0700) BP: (112-159)/(42-79) 147/79 (08/19 0700) SpO2:  [100 %] 100 % (08/19 0700) FiO2 (%):  [30 %] 30 % (08/18 0830) Weight:  [152 lb 8.9 oz (69.2 kg)] 152 lb 8.9 oz (69.2 kg) (08/19 0400) Last BM Date: 11/11/15  Filed Weights   11/10/15 0407 11/11/15 0330 11/12/15 0400  Weight: 154 lb 8.7 oz (70.1 kg) 153 lb (69.4 kg) 152 lb 8.9 oz (69.2 kg)    Intake/Output Summary (Last 24 hours) at 11/12/15 0733 Last data filed at 11/12/15 [id number]  Gross per 24 hour  Intake              260 ml  Output             1060 ml  Net             -800 ml    Telemetry: Currently sinus rhythm.  Exam:  General: Frail-appearing elderly male in no distress.  HEENT: NG tube in place.  Lungs: Clear anteriorly.  Cardiac: RRR without gallop.  Abdomen: Decreased bowel sounds.  Extremities: No pitting edema.  Lab Results:  Basic Metabolic Panel:  Recent Labs Lab 11/09/15 2050 11/10/15 0745 11/10/15 1352 11/11/15 0339  NA 140 140  --  141  K 4.6 4.4 4.3 3.6  CL 118* 117*  --  114*  CO2 16* 18*  --  21*  GLUCOSE 122* 144*  --  98  BUN 25* 22*  --  20  CREATININE 0.98 0.98  --  1.29*  CALCIUM 7.2* 7.3*  --  7.3*  MG  --  1.6* 2.1 1.9    Liver Function Tests:  Recent Labs Lab 11/11/15 0339  AST 10*  ALT 12*  ALKPHOS 54  BILITOT 0.9  PROT 4.3*  ALBUMIN 1.7*    CBC:  Recent Labs Lab 11/09/15 0315 11/09/15 1526 11/10/15 0425 11/11/15 0339  WBC 14.5*  --  16.7* 12.9*  HGB 12.4* 11.2* 12.3* 10.5*  HCT 37.0* 33.0* 36.6* 30.5*  MCV 89.2  --  89.1 88.4  PLT 262  --   287 302    Cardiac Enzymes:  Recent Labs Lab 11/09/15 1803 11/10/15 0119 11/10/15 0425  TROPONINI 0.15* 0.10* 0.10*    Echocardiogram 11/07/2015: Study Conclusions  - Left ventricle: The cavity size was normal. Wall thickness was   increased in a pattern of mild LVH. Indeterminant diastolic   function (atrial fibrillation). Systolic function was severely   reduced. The estimated ejection fraction was in the range of 25%   to 30%. Diffuse hypokinesis but lateral wall function looked   better than other walls. - Aortic valve: There was no stenosis. There was trivial   regurgitation. - Mitral valve: Mildly calcified annulus. There was no significant   regurgitation. - Left atrium: The atrium was mildly dilated. - Right ventricle: The cavity size was normal. Systolic function   was mildly to moderately reduced. - Tricuspid valve: Peak RV-RA gradient (S): 31 mm Hg. - Systemic veins: IVC was not visualized.  Impressions:  - The patient was in atrial fibrillation. Normal LV size with mild  LV hypertrophy. EF 25-30%. Lateral wall function looked   relatively preserved compared to other walls. Normal RV size with   mild to moderately decreased systolic function. Mild pulmonary   hypertension.  ECG: Tracing from 11/11/2015 showed sinus rhythm with IVCD and poor R-wave progression consistent with old anterior infarct pattern, leftward axis, no marked change compared to preoperative state.   Medications:   Scheduled Medications: . antiseptic oral rinse  7 mL Mouth Rinse q12n4p  . chlorhexidine  15 mL Mouth Rinse BID  . enoxaparin (LOVENOX) injection  30 mg Subcutaneous Q24H  . famotidine (PEPCID) IV  20 mg Intravenous Q24H  . metoprolol  2.5 mg Intravenous Q4H      PRN Medications:  sodium chloride, fentaNYL (SUBLIMAZE) injection, haloperidol lactate, ondansetron (ZOFRAN) IV   Assessment:   1. POD #3 after surgical management of SBO. Patient remains nothing by  mouth, NG tube in place. He was seen by the cardiology service for preoperative consultation, no major perioperative cardiac events.  2. Paroxysmal atrial fibrillation, currently maintaining sinus rhythm. On IV Lopressor with NPO state. CHADSVASC score is 4. Decision regarding risk versus benefit oral anticoagulation can be discussed later once patient more clinically stabilized.  3. History of nonischemic cardiomyopathy, LVEF currently in the 25-30% range. He has been given intermittent doses of IV Lasix to maintain volume status. As outpatient was on Toprol-XL, Prinzide, and Lasix orally. Had 800 cc out more than in last 24 hours.  4. Demand ischemia with mild elevation of troponin I in flat pattern.   Plan/Discussion:    Discussed with nursing. Would continue IV Lopressor for now, ultimately transition to Toprol-XL once patient able to take oral medications. Can also try to resume ACE inhibitor at that time. Follow intake and output today, may give additional dose of IV Lasix tomorrow but overall try to keep things even. Does not appear volume overloaded.   Satira Sark, M.D., F.A.C.C.

## 2015-11-13 LAB — BASIC METABOLIC PANEL
ANION GAP: 8 (ref 5–15)
BUN: 14 mg/dL (ref 6–20)
CALCIUM: 7.8 mg/dL — AB (ref 8.9–10.3)
CO2: 21 mmol/L — ABNORMAL LOW (ref 22–32)
Chloride: 114 mmol/L — ABNORMAL HIGH (ref 101–111)
Creatinine, Ser: 0.98 mg/dL (ref 0.61–1.24)
Glucose, Bld: 92 mg/dL (ref 65–99)
POTASSIUM: 3.8 mmol/L (ref 3.5–5.1)
Sodium: 143 mmol/L (ref 135–145)

## 2015-11-13 LAB — GLUCOSE, CAPILLARY
GLUCOSE-CAPILLARY: 93 mg/dL (ref 65–99)
GLUCOSE-CAPILLARY: 92 mg/dL (ref 65–99)
GLUCOSE-CAPILLARY: 83 mg/dL (ref 65–99)
GLUCOSE-CAPILLARY: 135 mg/dL — AB (ref 65–99)
GLUCOSE-CAPILLARY: 106 mg/dL — AB (ref 65–99)

## 2015-11-13 LAB — CBC
HEMATOCRIT: 36.1 % — AB (ref 39.0–52.0)
HEMOGLOBIN: 12 g/dL — AB (ref 13.0–17.0)
MCH: 29.7 pg (ref 26.0–34.0)
MCHC: 33.2 g/dL (ref 30.0–36.0)
MCV: 89.4 fL (ref 78.0–100.0)
Platelets: 399 10*3/uL (ref 150–400)
RBC: 4.04 MIL/uL — AB (ref 4.22–5.81)
RDW: 14.3 % (ref 11.5–15.5)
WBC: 12.1 10*3/uL — AB (ref 4.0–10.5)

## 2015-11-13 LAB — MAGNESIUM: MAGNESIUM: 1.8 mg/dL (ref 1.7–2.4)

## 2015-11-13 LAB — TRIGLYCERIDES: Triglycerides: 163 mg/dL — ABNORMAL HIGH (ref <150)

## 2015-11-13 MED ORDER — METOPROLOL SUCCINATE ER 25 MG PO TB24
25.0000 mg | ORAL_TABLET | Freq: Every day | ORAL | Status: DC
  Administered 2015-11-13 – 2015-11-16 (×4): 25 mg via ORAL
  Filled 2015-11-13 (×4): qty 1

## 2015-11-13 MED ORDER — RESOURCE THICKENUP CLEAR PO POWD
ORAL | Status: DC | PRN
  Filled 2015-11-13: qty 125

## 2015-11-13 MED ORDER — FUROSEMIDE 10 MG/ML IJ SOLN
20.0000 mg | Freq: Once | INTRAMUSCULAR | Status: AC
  Administered 2015-11-13: 20 mg via INTRAVENOUS
  Filled 2015-11-13: qty 2

## 2015-11-13 MED ORDER — LISINOPRIL 10 MG PO TABS
5.0000 mg | ORAL_TABLET | Freq: Every day | ORAL | Status: DC
  Administered 2015-11-13 – 2015-11-16 (×4): 5 mg via ORAL
  Filled 2015-11-13 (×2): qty 2
  Filled 2015-11-13 (×2): qty 1

## 2015-11-13 NOTE — Progress Notes (Signed)
Primary cardiologist: Dr. Dorris Carnes  Seen for followup: Perioperative assessment, PAF, cardiomyopathy  Subjective:    Resting this morning. Nursing reports overall stable day yesterday, now taking clear liquids by mouth.  Objective:   Temp:  [97.2 F (36.2 C)-98.5 F (36.9 C)] 98.2 F (36.8 C) (08/20 0400) Pulse Rate:  [56-129] 56 (08/20 0600) Resp:  [15-26] 17 (08/20 0600) BP: (103-163)/(46-104) 153/59 (08/20 0600) SpO2:  [89 %-100 %] 89 % (08/20 0600) Weight:  [156 lb 1.4 oz (70.8 kg)] 156 lb 1.4 oz (70.8 kg) (08/20 0500) Last BM Date: 11/12/15  Filed Weights   11/11/15 0330 11/12/15 0400 11/13/15 0500  Weight: 153 lb (69.4 kg) 152 lb 8.9 oz (69.2 kg) 156 lb 1.4 oz (70.8 kg)    Intake/Output Summary (Last 24 hours) at 11/13/15 0732 Last data filed at 11/13/15 0630  Gross per 24 hour  Intake              990 ml  Output              370 ml  Net              620 ml    Telemetry: Sinus rhythm.  Exam:  General: Frail-appearing elderly male in no distress.  Lungs: Clear anteriorly.  Cardiac: RRR without gallop.  Abdomen: Decreased bowel sounds.  Extremities: No pitting edema.  Lab Results:  Basic Metabolic Panel:  Recent Labs Lab 11/10/15 1352 11/11/15 0339 11/12/15 0800 11/13/15 0345  NA  --  141 145 143  K 4.3 3.6 3.8 3.8  CL  --  114* 114* 114*  CO2  --  21* 22 21*  GLUCOSE  --  98 71 92  BUN  --  20 20 14   CREATININE  --  1.29* 1.07 0.98  CALCIUM  --  7.3* 7.8* 7.8*  MG 2.1 1.9  --  1.8    Liver Function Tests:  Recent Labs Lab 11/11/15 0339  AST 10*  ALT 12*  ALKPHOS 54  BILITOT 0.9  PROT 4.3*  ALBUMIN 1.7*    CBC:  Recent Labs Lab 11/11/15 0339 11/12/15 0800 11/13/15 0345  WBC 12.9* 12.1* 12.1*  HGB 10.5* 11.1* 12.0*  HCT 30.5* 33.3* 36.1*  MCV 88.4 91.7 89.4  PLT 302 332 399    Cardiac Enzymes:  Recent Labs Lab 11/09/15 1803 11/10/15 0119 11/10/15 0425  TROPONINI 0.15* 0.10* 0.10*    Echocardiogram  11/07/2015: Study Conclusions  - Left ventricle: The cavity size was normal. Wall thickness was   increased in a pattern of mild LVH. Indeterminant diastolic   function (atrial fibrillation). Systolic function was severely   reduced. The estimated ejection fraction was in the range of 25%   to 30%. Diffuse hypokinesis but lateral wall function looked   better than other walls. - Aortic valve: There was no stenosis. There was trivial   regurgitation. - Mitral valve: Mildly calcified annulus. There was no significant   regurgitation. - Left atrium: The atrium was mildly dilated. - Right ventricle: The cavity size was normal. Systolic function   was mildly to moderately reduced. - Tricuspid valve: Peak RV-RA gradient (S): 31 mm Hg. - Systemic veins: IVC was not visualized.  Impressions:  - The patient was in atrial fibrillation. Normal LV size with mild   LV hypertrophy. EF 25-30%. Lateral wall function looked   relatively preserved compared to other walls. Normal RV size with   mild to moderately decreased systolic function. Mild  pulmonary   hypertension.   Medications:   Scheduled Medications: . ampicillin-sulbactam (UNASYN) IV  3 g Intravenous Q8H  . antiseptic oral rinse  7 mL Mouth Rinse q12n4p  . chlorhexidine  15 mL Mouth Rinse BID  . enoxaparin (LOVENOX) injection  30 mg Subcutaneous Q24H  . famotidine (PEPCID) IV  20 mg Intravenous Q24H  . metoprolol  2.5 mg Intravenous Q4H     PRN Medications: sodium chloride, fentaNYL (SUBLIMAZE) injection, haloperidol lactate, ondansetron (ZOFRAN) IV   Assessment:   1. POD #4 after surgical management of SBO. Patient now advanced to clear liquids by mouth, NG tube out. He was seen by the cardiology service for preoperative consultation, no major perioperative cardiac events.  2. Paroxysmal atrial fibrillation, currently maintaining sinus rhythm. Will attempt change back to Toprol-XL instead of IV Lopressor. CHADSVASC score is  4. Decision regarding risk versus benefit oral anticoagulation can be discussed later once patient more clinically stabilized.  3. History of nonischemic cardiomyopathy, LVEF currently in the 25-30% range. He has been given intermittent doses of IV Lasix to maintain volume status. As outpatient was on Toprol-XL, Prinzide, and Lasix orally. Net 600 cc in more than out last 24 hours.  4. Demand ischemia with mild elevation of troponin I in flat pattern.   Plan/Discussion:    Discussed with nursing. Will transition to Toprol-XL 25 mg daily. Also resume ACE inhibitor. Follow intake and output, will give additional dose of IV Lasix. BMET AM. Renal function has been stable.   Satira Sark, M.D., F.A.C.C.

## 2015-11-13 NOTE — Evaluation (Signed)
Clinical/Bedside Swallow Evaluation Patient Details  Name: [name] MRN: [id number] Date of Birth: Jul 07, 1921  Today's Date: 11/13/2015 Time: SLP Start Time (ACUTE ONLY): 1501 SLP Stop Time (ACUTE ONLY): 1536 SLP Time Calculation (min) (ACUTE ONLY): 35 min  Past Medical History:  Past Medical History:  Diagnosis Date  . BARRETTS ESOPHAGUS 11/19/2007  . Blood transfusion without reported diagnosis   . CHANGE IN BOWELS 02/01/2009  . Chronic combined systolic (congestive) and diastolic (congestive) heart failure ([place])    a. echo 2015: EF 45% b. Echo 10/2015: EF 25-30% with diffuse HK  . Diarrhea 01/31/2009  . DIVERTICULOSIS, COLON 11/19/2007  . GASTRITIS 11/19/2007  . GERD 11/19/2007  . HIATAL HERNIA 11/19/2007  . HTN (hypertension)   . HYPERLIPIDEMIA 06/27/2009  . HYPOTHYROIDISM 06/27/2009  . IBS (irritable bowel syndrome)   . MELENA 08/01/2009  . Nonischemic cardiomyopathy ([place])    a. NST in 2010 with no evidence of ischemia.  Marland Kitchen PERSONAL HX COLONIC POLYPS 02/01/2009  . TINEA CORPORIS 06/27/2009  . TOBACCO USE, QUIT 06/27/2009  . TREMOR, ESSENTIAL 06/27/2009   Past Surgical History:  Past Surgical History:  Procedure Laterality Date  . CHOLECYSTECTOMY    . ESOPHAGOGASTRODUODENOSCOPY N/A 04/28/2013   Procedure: ESOPHAGOGASTRODUODENOSCOPY (EGD);  Surgeon: Jerene Bears, MD;  Location: [facility];  Service: Gastroenterology;  Laterality: N/A;  . HERNIA REPAIR    . HIP ARTHROPLASTY Left 11/30/2013   Procedure: ARTHROPLASTY BIPOLAR HIP;  Surgeon: [name], MD;  Location: [facility];  Service: Orthopedics;  Laterality: Left;  . LAPAROTOMY N/A 11/09/2015   Procedure: EXPLORATORY LAPAROTOMY;  Surgeon: [name], MD;  Location: [facility];  Service: General;  Laterality: N/A;  . LUNG SURGERY  1984  . TONSILLECTOMY    . TONSILLECTOMY    . UMBILICAL HERNIA REPAIR    . VASECTOMY     HPI:  Ptis a 82 y.o.malewith medical history significant of cardiomyopathy, lung lobectomy in 1980s, Barretts  esophagus, vertebral compression fracture who is quite independent at baseline and developed sudden onset abdominal pain and vomited once whileat [employer] around 11 pm. No fever or chills. Found to have intussusception of ileum. Also found to be hypoxic and thought to have aspirated while vomiting. While being in the hospital he has developed new onset paroxysmal A-fib with RVR. HR has been difficult to control and is being managed with IV Cardizem in addition to IV Lopressor. Also found to have a new drop in EF from 45 to 25%. Chest x ray with stable nodular appearing opacity in the right lower lobe, likely a   Assessment / Plan / Recommendation Clinical Impression  Pt presents with a mild oral and suspected moderate pharyngeal dysphagia. Also note hx of Baretts esophagus. PO trials of thin and puree consistencies resulted in consistent wet vocal quality, throat clearing, and coughing concerning for reduced airway protection. Signs and symtpoms reduced with nectar thick liquids though occasional wet vocal quality persisted. Vocal quality cleared with cueing for hard swallow. Pt noted to have oxygen desaturation during PO trials, notified nursing.  Given right lower lobe opacity and symtpomatic BSE, recommend diet downgrade to nectar thick liquids with medicines whole in puree. Nursing reports diet restrictions for liquids until tomorrow secondary to prior bowel obstruction. ST to follow up, MBS may be indicated to further assess swallow function.     Aspiration Risk  Moderate aspiration risk    Diet Recommendation   Nectar thick liquids   Medication Administration: Whole meds with liquid  Other  Recommendations Oral Care Recommendations: Oral care BID Other Recommendations: Order thickener from pharmacy   Follow up Recommendations       Frequency and Duration min 2x/week  1 week       Prognosis        Swallow Study   General Date of Onset: 11/03/15 HPI: Ptis a 82 y.o.malewith  medical history significant of cardiomyopathy, lung lobectomy in 1980s, Barretts esophagus, vertebral compression fracture who is quite independent at baseline and developed sudden onset abdominal pain and vomited once whileat [employer] around 11 pm. No fever or chills. Found to have intussusception of ileum. Also found to be hypoxic and thought to have aspirated while vomiting. While being in the hospital he has developed new onset paroxysmal A-fib with RVR. HR has been difficult to control and is being managed with IV Cardizem in addition to IV Lopressor. Also found to have a new drop in EF from 45 to 25%. Chest x ray with stable nodular appearing opacity in the right lower lobe, likely a Type of Study: Bedside Swallow Evaluation Previous Swallow Assessment: none on file  Diet Prior to this Study: Thin liquids Temperature Spikes Noted: No Respiratory Status: Room air History of Recent Intubation: No Behavior/Cognition: Alert;Pleasant mood Oral Cavity Assessment: Within Functional Limits Oral Cavity - Dentition: Dentures, not available;Missing dentition (upper dentures at baseline not at hospital ) Vision: Functional for self-feeding Self-Feeding Abilities: Able to feed self Patient Positioning: Upright in chair Baseline Vocal Quality: Low vocal intensity Volitional Cough: Congested Volitional Swallow: Able to elicit    Oral/Motor/Sensory Function Overall Oral Motor/Sensory Function: Generalized oral weakness   Ice Chips Ice chips: Not tested   Thin Liquid Thin Liquid: Impaired Presentation: Cup Oral Phase Functional Implications: Prolonged oral transit Pharyngeal  Phase Impairments: Suspected delayed Swallow;Throat Clearing - Delayed;Throat Clearing - Immediate;Cough - Immediate;Cough - Delayed;Wet Vocal Quality;Multiple swallows    Nectar Thick Nectar Thick Liquid: Impaired Presentation: Cup Oral phase functional implications: Prolonged oral transit Pharyngeal Phase Impairments:  Suspected delayed Swallow;Multiple swallows;Throat Clearing - Delayed;Wet Vocal Quality   Honey Thick Honey Thick Liquid: Not tested   Puree Puree: Impaired Oral Phase Functional Implications: Prolonged oral transit Pharyngeal Phase Impairments: Multiple swallows;Suspected delayed Swallow;Wet Vocal Quality;Throat Clearing - Delayed   Solid   GO   Solid: Impaired Oral Phase Impairments: Impaired mastication Oral Phase Functional Implications: Prolonged oral transit Pharyngeal Phase Impairments: Suspected delayed Swallow;Multiple swallows       Arvil Chaco MA, CCC-SLP Acute Care Speech Language Pathologist    Arvil Chaco E 11/13/2015,3:37 PM

## 2015-11-13 NOTE — Progress Notes (Signed)
PROGRESS NOTE    [name]  [id number] DOB: Nov 15, 1921 DOA: 11/03/2015  PCP: Jenny Reichmann, MD   Brief Narrative:  [name] is a 82 y.o. male with medical history significant of cardiomyopathy, lung lobectomy in 1980s, Barretts esophagus, vertebral compression fracture who is quite independent at baseline and developed sudden onset abdominal pain and vomited once while at [employer] around 11 pm. No fever or chills.  Found to have intussusception of ileum. Also found to be hypoxic and thought to have aspirated while vomiting. While being in the hospital he has developed new onset paroxysmal  A-fib with RVR. HR has been difficult to control and is being managed with IV Cardizem in addition to IV Lopressor. Also found to have a new drop in EF from 45 to 25%.   Subjective: Alert, wants to get out. Denies pain.  Had multiple BM yesterday   Assessment & Plan:   Principal Problem:   Small bowel obstruction  - due to Bezoar of terminal ileum , near total obstruction proximal bowel distention with early near perforation of multiple jejunal diverticula. -  surgery following-  -enterotomy and removal of bezoar on 8-16. -NG tube removed. Moving bowel.  -advanced to full liquid diet.   Acute hypoxic resp failure - ? Aspiration pneumonia- started on Unasyn in ER and eventually was weaned to room air-  repeat CXR showed bibasilar opacities with hypo expended lungs- - cont Unasyn and O2 day 8, need 2 more days.  - PRN nebs - Xopenex as he has A-fib  Cough while eating; speech swallow evaluation.   Acute delirium - PRN Haldol and sitter- he does better when family is at bedside -improved.   Hypoglycemia; D 5 IV fluids. If diet started ok to discontnue fluids.   Diarrhea; if persist might need to check for C diff.   A-fib with RVR - no prior history of this - on  IV Metoprolol-  -Cardizem was discontinue.  - ECHO ordered-EF 25-30% -TSH normal./   Systolic CHF -  chronic - previously  EF 45 %, gr 1 d CHF - ECHO shows a new finding of EF of 25-30% with mild to mod right heart failure as well- mild Pulm HTN - on B blocker at this time- will need ACE I / ARB as tolerated if he recovers from SBO -cardiology was consulted for pre op evaluation.  -IV lasix today.   AKI- pleural effusions  - due to dehydration/ hypotension? - Cr increased today. Follow trend. Started iv fluids.   Hypokalemia Resolved.   Hypernatremia -resolved with changed IVF to lower sodium concentration  HTN - Lasix, Lisinopril/ HCTZ on hold - IV Lopressor   Gastritis/ GERD? - Pepcid IV  Very hard of hearing - has appt for hearing aid on Friday   DVT prophylaxis: Lovenox Code Status: Full code Family Communication: son, Marya Amsler Disposition Plan: to be determined. Remain in the step down. PT consult.   Consultants:   gen surgery Procedures:    Antimicrobials:  Anti-infectives    Start     Dose/Rate Route Frequency Ordered Stop   11/12/15 0800  Ampicillin-Sulbactam (UNASYN) 3 g in sodium chloride 0.9 % 100 mL IVPB     3 g 100 mL/hr over 60 Minutes Intravenous Every 8 hours 11/12/15 0751     11/10/15 1600  Ampicillin-Sulbactam (UNASYN) 3 g in sodium chloride 0.9 % 100 mL IVPB     3 g 100 mL/hr over 60 Minutes Intravenous Every 6  hours 11/10/15 1152 11/11/15 0453   11/10/15 1000  Ampicillin-Sulbactam (UNASYN) 3 g in sodium chloride 0.9 % 100 mL IVPB  Status:  Discontinued     3 g 100 mL/hr over 60 Minutes Intravenous Every 6 hours 11/10/15 0910 11/10/15 1151   11/07/15 1600  Ampicillin-Sulbactam (UNASYN) 3 g in sodium chloride 0.9 % 100 mL IVPB  Status:  Discontinued     3 g 100 mL/hr over 60 Minutes Intravenous Every 6 hours 11/07/15 1024 11/09/15 1731   11/06/15 2200  Ampicillin-Sulbactam (UNASYN) 3 g in sodium chloride 0.9 % 100 mL IVPB  Status:  Discontinued     3 g 100 mL/hr over 60 Minutes Intravenous Every 12 hours 11/06/15 1020 11/07/15 1024   11/03/15  1930  Ampicillin-Sulbactam (UNASYN) 3 g in sodium chloride 0.9 % 100 mL IVPB  Status:  Discontinued     3 g 100 mL/hr over 60 Minutes Intravenous Every 6 hours 11/03/15 1848 11/06/15 1020   11/03/15 1600  cefTRIAXone (ROCEPHIN) 1 g in dextrose 5 % 50 mL IVPB     1 g 100 mL/hr over 30 Minutes Intravenous  Once 11/03/15 1558 11/03/15 1636   11/03/15 1600  azithromycin (ZITHROMAX) 500 mg in dextrose 5 % 250 mL IVPB     500 mg 250 mL/hr over 60 Minutes Intravenous  Once 11/03/15 1558 11/03/15 1858       Objective: Vitals:   11/13/15 0500 11/13/15 0600 11/13/15 0700 11/13/15 0800  BP: (!) 152/65 (!) 153/59 (!) 145/53 (!) 145/77  Pulse: (!) 58 (!) 56 (!) 59   Resp: 16 17 17 19   Temp:      TempSrc:      SpO2: 96% (!) 89% 96%   Weight: 70.8 kg (156 lb 1.4 oz)     Height:        Intake/Output Summary (Last 24 hours) at 11/13/15 0836 Last data filed at 11/13/15 0630  Gross per 24 hour  Intake              970 ml  Output              370 ml  Net              600 ml   Filed Weights   11/11/15 0330 11/12/15 0400 11/13/15 0500  Weight: 69.4 kg (153 lb) 69.2 kg (152 lb 8.9 oz) 70.8 kg (156 lb 1.4 oz)    Examination: General exam: Appears comfortable  HEENT: PERRLA, oral mucosa moist, no sclera icterus or thrush Respiratory system: Clear to auscultation. Respiratory effort normal. Cardiovascular system: S1 & S2 heard, RRR.  No murmurs  Gastrointestinal system: Abdomen soft, non-tender, nondistended,  No organomegaly Central nervous system: Alert and oriented to place. No focal neurological deficits. Extremities: No cyanosis, clubbing or edema Skin: No rashes or ulcers Psychiatry:  Mood & affect appropriate.     Data Reviewed: I have personally reviewed following labs and imaging studies  CBC:  Recent Labs Lab 11/09/15 0315 11/09/15 1526 11/10/15 0425 11/11/15 0339 11/12/15 0800 11/13/15 0345  WBC 14.5*  --  16.7* 12.9* 12.1* 12.1*  NEUTROABS  --   --   --  10.0*  --    --   HGB 12.4* 11.2* 12.3* 10.5* 11.1* 12.0*  HCT 37.0* 33.0* 36.6* 30.5* 33.3* 36.1*  MCV 89.2  --  89.1 88.4 91.7 89.4  PLT 262  --  287 302 332 [id number]   Basic Metabolic Panel:  Recent Labs Lab 11/07/15  0725  11/09/15 2050 11/10/15 0745 11/10/15 1352 11/11/15 0339 11/12/15 0800 11/13/15 0345  NA 140  < > 140 140  --  141 145 143  K 3.1*  < > 4.6 4.4 4.3 3.6 3.8 3.8  CL 109  < > 118* 117*  --  114* 114* 114*  CO2 24  < > 16* 18*  --  21* 22 21*  GLUCOSE 136*  < > 122* 144*  --  98 71 92  BUN 31*  < > 25* 22*  --  20 20 14   CREATININE 1.05  < > 0.98 0.98  --  1.29* 1.07 0.98  CALCIUM 8.2*  < > 7.2* 7.3*  --  7.3* 7.8* 7.8*  MG 1.9  --   --  1.6* 2.1 1.9  --  1.8  PHOS  --   --   --  3.1  --  3.4  --   --   < > = values in this interval not displayed. GFR: Estimated Creatinine Clearance: 41.8 mL/min (by C-G formula based on SCr of 0.98 mg/dL). Liver Function Tests:  Recent Labs Lab 11/11/15 0339  AST 10*  ALT 12*  ALKPHOS 54  BILITOT 0.9  PROT 4.3*  ALBUMIN 1.7*   No results for input(s): LIPASE, AMYLASE in the last 168 hours. No results for input(s): AMMONIA in the last 168 hours. Coagulation Profile: No results for input(s): INR, PROTIME in the last 168 hours. Cardiac Enzymes:  Recent Labs Lab 11/09/15 1803 11/10/15 0119 11/10/15 0425  TROPONINI 0.15* 0.10* 0.10*   BNP (last 3 results) No results for input(s): PROBNP in the last 8760 hours. HbA1C: No results for input(s): HGBA1C in the last 72 hours. CBG:  Recent Labs Lab 11/12/15 1151 11/12/15 1606 11/12/15 2020 11/13/15 0342 11/13/15 0756  GLUCAP 103* 109* 138* 93 92   Lipid Profile: No results for input(s): CHOL, HDL, LDLCALC, TRIG, CHOLHDL, LDLDIRECT in the last 72 hours. Thyroid Function Tests: No results for input(s): TSH, T4TOTAL, FREET4, T3FREE, THYROIDAB in the last 72 hours. Anemia Panel: No results for input(s): VITAMINB12, FOLATE, FERRITIN, TIBC, IRON, RETICCTPCT in the last 72  hours. Urine analysis:    Component Value Date/Time   COLORURINE YELLOW 11/03/2015 [place] 11/03/2015 1420   LABSPEC 1.038 (H) 11/03/2015 1420   PHURINE 6.0 11/03/2015 1420   GLUCOSEU NEGATIVE 11/03/2015 1420   HGBUR NEGATIVE 11/03/2015 1420   BILIRUBINUR NEGATIVE 11/03/2015 1420   BILIRUBINUR neg 08/18/2013 0848   KETONESUR NEGATIVE 11/03/2015 1420   PROTEINUR NEGATIVE 11/03/2015 1420   UROBILINOGEN 0.2 11/30/2013 0804   NITRITE NEGATIVE 11/03/2015 1420   LEUKOCYTESUR NEGATIVE 11/03/2015 1420   Sepsis Labs: @LABRCNTIP (procalcitonin:4,lacticidven:4) ) Recent Results (from the past 240 hour(s))  MRSA PCR Screening     Status: None   Collection Time: 11/03/15  9:50 PM  Result Value Ref Range Status   MRSA by PCR NEGATIVE NEGATIVE Final    Comment:        The GeneXpert MRSA Assay (FDA approved for NASAL specimens only), is one component of a comprehensive MRSA colonization surveillance program. It is not intended to diagnose MRSA infection nor to guide or monitor treatment for MRSA infections.          Radiology Studies: No results found.    Scheduled Meds: . ampicillin-sulbactam (UNASYN) IV  3 g Intravenous Q8H  . antiseptic oral rinse  7 mL Mouth Rinse q12n4p  . chlorhexidine  15 mL Mouth Rinse BID  .  enoxaparin (LOVENOX) injection  30 mg Subcutaneous Q24H  . famotidine (PEPCID) IV  20 mg Intravenous Q24H  . lisinopril  5 mg Oral Daily  . metoprolol succinate  25 mg Oral Daily   Continuous Infusions:     LOS: 10 days    Time spent in minutes: 35    Kathleena Freeman A, MD [facility] [id number] [email or web address] Password [facility] 11/13/2015, 8:36 AM

## 2015-11-13 NOTE — Progress Notes (Signed)
4 Days Post-Op  Subjective: No complaints. More clear than yesterday. Able to figure things out with reasoning  Objective: Vital signs in last 24 hours: Temp:  [97.4 F (36.3 C)-98.5 F (36.9 C)] 98.2 F (36.8 C) (08/20 0400) Pulse Rate:  [56-129] 56 (08/20 0600) Resp:  [15-26] 17 (08/20 0600) BP: (103-163)/(46-104) 153/59 (08/20 0600) SpO2:  [89 %-100 %] 89 % (08/20 0600) Weight:  [70.8 kg (156 lb 1.4 oz)] 70.8 kg (156 lb 1.4 oz) (08/20 0500) Last BM Date: 11/12/15  Intake/Output from previous day: 08/19 0701 - 08/20 0700 In: 990 [P.O.:320; I.V.:320; IV Piggyback:350] Out: 370 [Urine:370] Intake/Output this shift: No intake/output data recorded.  Resp: clear to auscultation bilaterally Cardio: regular rate and rhythm GI: soft, nontender. mild distension. having bm's. incision looks good  Lab Results:   Recent Labs  11/12/15 0800 11/13/15 0345  WBC 12.1* 12.1*  HGB 11.1* 12.0*  HCT 33.3* 36.1*  PLT 332 399   BMET  Recent Labs  11/12/15 0800 11/13/15 0345  NA 145 143  K 3.8 3.8  CL 114* 114*  CO2 22 21*  GLUCOSE 71 92  BUN 20 14  CREATININE 1.07 0.98  CALCIUM 7.8* 7.8*   PT/INR No results for input(s): LABPROT, INR in the last 72 hours. ABG No results for input(s): PHART, HCO3 in the last 72 hours.  Invalid input(s): PCO2, PO2  Studies/Results: No results found.  Anti-infectives: Anti-infectives    Start     Dose/Rate Route Frequency Ordered Stop   11/12/15 0800  Ampicillin-Sulbactam (UNASYN) 3 g in sodium chloride 0.9 % 100 mL IVPB     3 g 100 mL/hr over 60 Minutes Intravenous Every 8 hours 11/12/15 0751     11/10/15 1600  Ampicillin-Sulbactam (UNASYN) 3 g in sodium chloride 0.9 % 100 mL IVPB     3 g 100 mL/hr over 60 Minutes Intravenous Every 6 hours 11/10/15 1152 11/11/15 0453   11/10/15 1000  Ampicillin-Sulbactam (UNASYN) 3 g in sodium chloride 0.9 % 100 mL IVPB  Status:  Discontinued     3 g 100 mL/hr over 60 Minutes Intravenous Every 6  hours 11/10/15 0910 11/10/15 1151   11/07/15 1600  Ampicillin-Sulbactam (UNASYN) 3 g in sodium chloride 0.9 % 100 mL IVPB  Status:  Discontinued     3 g 100 mL/hr over 60 Minutes Intravenous Every 6 hours 11/07/15 1024 11/09/15 1731   11/06/15 2200  Ampicillin-Sulbactam (UNASYN) 3 g in sodium chloride 0.9 % 100 mL IVPB  Status:  Discontinued     3 g 100 mL/hr over 60 Minutes Intravenous Every 12 hours 11/06/15 1020 11/07/15 1024   11/03/15 1930  Ampicillin-Sulbactam (UNASYN) 3 g in sodium chloride 0.9 % 100 mL IVPB  Status:  Discontinued     3 g 100 mL/hr over 60 Minutes Intravenous Every 6 hours 11/03/15 1848 11/06/15 1020   11/03/15 1600  cefTRIAXone (ROCEPHIN) 1 g in dextrose 5 % 50 mL IVPB     1 g 100 mL/hr over 30 Minutes Intravenous  Once 11/03/15 1558 11/03/15 1636   11/03/15 1600  azithromycin (ZITHROMAX) 500 mg in dextrose 5 % 250 mL IVPB     500 mg 250 mL/hr over 60 Minutes Intravenous  Once 11/03/15 1558 11/03/15 1858      Assessment/Plan: s/p Procedure(s): EXPLORATORY LAPAROTOMY (N/A) Advance diet. Allow fulls today Paroxysmal afib. Cards following. Started toprol and lisinopril/HCTZ today OOB Continue to monitor in icu  LOS: 10 days    [place]  11/13/2015 

## 2015-11-14 LAB — CBC
HCT: 34.4 % — ABNORMAL LOW (ref 39.0–52.0)
Hemoglobin: 11.5 g/dL — ABNORMAL LOW (ref 13.0–17.0)
MCH: 30.1 pg (ref 26.0–34.0)
MCHC: 33.4 g/dL (ref 30.0–36.0)
MCV: 90.1 fL (ref 78.0–100.0)
PLATELETS: 405 10*3/uL — AB (ref 150–400)
RBC: 3.82 MIL/uL — ABNORMAL LOW (ref 4.22–5.81)
RDW: 14.4 % (ref 11.5–15.5)
WBC: 11.1 10*3/uL — AB (ref 4.0–10.5)

## 2015-11-14 LAB — GLUCOSE, CAPILLARY
GLUCOSE-CAPILLARY: 95 mg/dL (ref 65–99)
Glucose-Capillary: 86 mg/dL (ref 65–99)
Glucose-Capillary: 81 mg/dL (ref 65–99)

## 2015-11-14 LAB — BASIC METABOLIC PANEL
ANION GAP: 5 (ref 5–15)
BUN: 14 mg/dL (ref 6–20)
CALCIUM: 7.6 mg/dL — AB (ref 8.9–10.3)
CO2: 27 mmol/L (ref 22–32)
Chloride: 111 mmol/L (ref 101–111)
Creatinine, Ser: 0.91 mg/dL (ref 0.61–1.24)
Glucose, Bld: 95 mg/dL (ref 65–99)
POTASSIUM: 3.2 mmol/L — AB (ref 3.5–5.1)
Sodium: 143 mmol/L (ref 135–145)

## 2015-11-14 MED ORDER — POTASSIUM CHLORIDE CRYS ER 20 MEQ PO TBCR
40.0000 meq | EXTENDED_RELEASE_TABLET | ORAL | Status: AC
  Administered 2015-11-14 (×2): 40 meq via ORAL
  Filled 2015-11-14 (×2): qty 2

## 2015-11-14 MED ORDER — ACETAMINOPHEN 325 MG PO TABS: 650.0000 mg | ORAL_TABLET | Freq: Four times a day (QID) | ORAL | Status: DC | PRN

## 2015-11-14 MED ORDER — FUROSEMIDE 20 MG PO TABS
20.0000 mg | ORAL_TABLET | Freq: Every day | ORAL | Status: DC
Start: 2015-11-14 — End: 2015-11-16
  Administered 2015-11-14 – 2015-11-16 (×3): 20 mg via ORAL
  Filled 2015-11-14 (×3): qty 1

## 2015-11-14 NOTE — Progress Notes (Signed)
Nutrition Follow-up  DOCUMENTATION CODES:   Not applicable  INTERVENTION:  - Will order Magic Cup BID with meals, each supplement provides 290 kcal and 9 grams of protein - Continue to encourage PO intakes of meals and supplements. - RD will continue to monitor for additional needs.  NUTRITION DIAGNOSIS:   Inadequate oral intake related to inability to eat as evidenced by NPO status. -improving with diet advancement.  GOAL:   Patient will meet greater than or equal to 90% of their needs -unmet at this time.  MONITOR:   PO intake, Supplement acceptance, Weight trends, Labs, Skin, I & O's  ASSESSMENT:   82 y.o. male with medical history significant of cardiomyopathy, lung lobectomy in 1980s, Barretts esophagus, vertebral compression fracture who developed sudden onset abdominal pain and vomited once while at [facility] today around 11 pm. Pt then had one episode of emesis. Currently not in abdominal pain and has had no further vomiting.  Pt found to have SBO and NGT placed to suction.  8/21 Diet advancement:  8/16 @ 1730: NPO 8/19 @ [id number]: CLD 8/20 @ 0810: FLD  Chart review indicates pt consumed 100% of lunch yesterday (FLD) and no other intakes documented since diet advancement. Pt recently moved from [address] [place] to [place]. Will order Magic Cup BID given need for thickened liquids and continue to monitor for additional needs. Resource Thicken Up has already been ordered. Weight trending back toward admission weight with daily Lasix order in place.   Medications reviewed; 20 mg oral Lasix/day, PRN IV Zofran.  Labs reviewed; CBGs: 81-95 mg/dL, K: 3.2 mmol/L, Ca: 7.6 mg/dL.   8/18 - Pt was extubated earlier this AM and estimated nutrition needs updated based on this as well as POD #2.  - Pt continues with NGT with 100cc drainage at time of RD visit. - Pt's weight remains stable since yesterday with Lasix x1 yesterday.  - Notes indicate likely plan to initiate TPN for prolonged NPO  (now 8 days).    8/17 - Pt continues with NGT to suction with 100cc drainage present at this time.  - Pt now intubated following procedure yesterday:ex lap enterotomy with removal of bezoar and decompression of distended small bowel and oversewing of compromised jejunal diverticula. - Estimated nutrition needs updated this AM d/t ventilation.  - RT note states MD advised to start weaning.  - Bilateral mittens in place and no family/visitors present at this time.  - Pt off pressors and not on any sedation at this time.  - Pt has now been NPO x7 days.  Patient is currently intubated on ventilator support MV: 10.3L/min Temp (24hrs), Avg:97.8 F (36.6 C), Min:97.3 F (36.3 C), Max:98.6 F (37 C) IVF: D5-1/2 NS-20 mEq KCl @ 50 mL/hr (204 kcal).   Diet Order:  Diet full liquid Room service appropriate? Yes; Fluid consistency: Nectar Thick  Skin:  Reviewed, no issues  Last BM:  8/20  Height:   Ht Readings from Last 1 Encounters:  11/09/15 5\' 5"  (1.651 m)    Weight:   Wt Readings from Last 1 Encounters:  11/14/15 147 lb 14.9 oz (67.1 kg)    Ideal Body Weight:  61.82 kg  BMI:  Body mass index is 24.62 kg/m.  Estimated Nutritional Needs:   Kcal:  1500-1700  Protein:  75-85 grams  Fluid:  1.5-1.7 L/day  EDUCATION NEEDS:   No education needs identified at this time    Jarome Matin, MS, RD, LDN Inpatient Clinical Dietitian Pager # [phone]  After hours/weekend pager # [phone]

## 2015-11-14 NOTE — Progress Notes (Signed)
Hospital Problem List     Principal Problem:   Small bowel obstruction from bezoar s/p ex lap & evacuation 11/09/2015 Active Problems:   Essential hypertension   CKD (chronic kidney disease) stage 2, GFR 60-89 ml/min   Protein-calorie malnutrition, severe (HCC)   A-fib (HCC)   Chronic combined systolic and diastolic CHF (congestive heart failure) ([place])   Compression fracture of L2 lumbar vertebra (HCC)   Intussusception (HCC)   Acute respiratory failure ([place])   Cardiomyopathy ([place])   Bezoar of ileum s/p ex lap & evacuation 11/09/2015    Patient Profile:   Primary Cardiologist:Dr. Ross  82 y.o.male w/ PMH of chronic combined systolic and diastolic CHF ([id number] by echo in 2015) presumed to be nonischemic cardiomyopathy (no ischemia on NST in 2010), HTN, HLD, Hypothyroidism and Barrett's esophagus [facility]. Cards consulted for pre-op clearance.  Subjective   Consumed breakfast this AM (Full Liquids). Reading the newspaper comics. Denies any chest discomfort, palpitations, or dyspnea.   Inpatient Medications    . ampicillin-sulbactam (UNASYN) IV  3 g Intravenous Q8H  . antiseptic oral rinse  7 mL Mouth Rinse q12n4p  . chlorhexidine  15 mL Mouth Rinse BID  . enoxaparin (LOVENOX) injection  30 mg Subcutaneous Q24H  . famotidine (PEPCID) IV  20 mg Intravenous Q24H  . lisinopril  5 mg Oral Daily  . metoprolol succinate  25 mg Oral Daily  . potassium chloride  40 mEq Oral Q4H    Vital Signs    Vitals:   11/14/15 0500 11/14/15 0800 11/14/15 0856 11/14/15 0900  BP:  (!) 139/49    Pulse:  62  74  Resp:  19  (!) 22  Temp: 97.7 F (36.5 C) 97.7 F (36.5 C)    TempSrc: Oral Oral    SpO2:  96% (!) 89% 97%  Weight: 147 lb 14.9 oz (67.1 kg)     Height:        Intake/Output Summary (Last 24 hours) at 11/14/15 [id number] Last data filed at 11/14/15 0800  Gross per 24 hour  Intake             1230 ml  Output              575 ml  Net               655 ml   Filed Weights   11/12/15 0400 11/13/15 0500 11/14/15 0500  Weight: 152 lb 8.9 oz (69.2 kg) 156 lb 1.4 oz (70.8 kg) 147 lb 14.9 oz (67.1 kg)    Physical Exam    General: Elderly Caucasian male appearing in no acute distress. Head: Normocephalic, atraumatic.  Neck: Supple without bruits, JVD not elevated. Lungs:  Resp regular and unlabored, CTA without wheezing or rales. Heart: RRR, S1, S2, no S3, S4, or murmur; no rub. Abdomen: Soft, non-tender, non-distended with normoactive bowel sounds. No hepatomegaly. No rebound/guarding. No obvious abdominal masses. Extremities: No clubbing, cyanosis, or edema. Distal pedal pulses are 2+ bilaterally. Neuro: Alert and oriented X 3. Moves all extremities spontaneously. Psych: Normal affect.  Labs    CBC  Recent Labs  11/13/15 0345 11/14/15 0838  WBC 12.1* 11.1*  HGB 12.0* 11.5*  HCT 36.1* 34.4*  MCV 89.4 90.1  PLT 399 [id number]*   Basic Metabolic Panel  Recent Labs  11/13/15 0345 11/14/15 0404  NA 143 143  K 3.8 3.2*  CL 114* 111  CO2 21* 27  GLUCOSE 92 95  BUN 14 14  CREATININE 0.98 0.91  CALCIUM 7.8* 7.6*  MG 1.8  --    Fasting Lipid Panel  Recent Labs  11/13/15 0345  TRIG 163*     Telemetry    NSR, HR in mid-50's - 70's. No atopic events.   ECG    No new tracings.    Cardiac Studies and Radiology    Ct Abdomen Pelvis Wo Contrast  Result Date: 11/06/2015 CLINICAL DATA:  Intussusception with SBO KUB unremarkable, but distended. No flatus or BM. Continue NGT Still confused, in gloves. No abdominal pain. Difficult to assess. Now on 3rd day of NGT. EXAM: CT ABDOMEN AND PELVIS WITHOUT CONTRAST TECHNIQUE: Multidetector CT imaging of the abdomen and pelvis was performed following the standard protocol without IV contrast. COMPARISON:  11/03/2015 FINDINGS: Lung bases: There is dependent lung base opacity associated with densely calcified pleural plaques and minimal pleural effusions. Lung base opacity  most consistent with atelectasis. Heart is mildly enlarged. No convincing pulmonary edema. Hepatobiliary: Liver is unremarkable. Gallbladder surgically absent. No bile duct dilation. Spleen: 7 mm low-density lesion posteriorly. This is consistent with a cyst and is stable. There calcified splenic granuloma, also stable. Spleen normal in size. Pancreas: Unremarkable. Adrenal glands:  No masses. Kidneys, ureters, bladder: Bilateral renal cortical thinning. Small hyper attenuating lesion from the lateral lower pole the left kidney measuring 7 mm, likely a cyst complicated by previous hemorrhage or proteinaceous contents. It is stable. No other renal masses, no stones and no hydronephrosis. Normal ureters. Bladder is unremarkable. Vascular: Diffuse ectasia of the abdominal aorta with atherosclerotic calcifications. Infrarenal aorta measures 3 cm greatest diameter. Loss Lymph nodes:  No adenopathy. Ascites: Small amount ascites noted adjacent to liver, along the pericolic gutters and in the posterior pelvic recess. Gastrointestinal: There are 2 contiguous densities in the distal ileum, 1 measuring 19 mm and the other 2.3 cm in greatest dimension. The current exam, this does not appear to be an intussusception but dense material within the small bowel. Bowel distal this is decompressed while bowel proximal to this is mildly distended. The colon is mostly decompressed. There is no bowel wall thickening. Normal appendix is visualized. Musculoskeletal: Well-positioned left hip total arthroplasty. Bones diffusely demineralized. There is a marked compression fracture of L2, which appears chronic. Mild compression fractures of T12 and T8. Bones are diffusely demineralized. IMPRESSION: 1. Partial small bowel obstruction similar to the prior CT. What was felt be an ileal intussusception on the prior study now appears more consistent with 2 adjacent densities, consistent with stones or dense stool, in the distal ileum. These may  or may not be the cause of the obstruction. Bowel distal to these densities is decompressed. There is currently no convincing intussusception. 2. Small amount ascites, increased when compared the prior CT. 3. Minimal pleural effusions and dependent lung base atelectasis, increased from the prior study. Densely calcified pleural plaque at the lung bases is stable. 4. Aortic atherosclerosis and aortic ectasia, of the infrarenal abdominal aorta measuring 3 cm. Recommend followup by ultrasound in 3 years. This recommendation follows ACR consensus guidelines: White Paper of the ACR Incidental Findings Committee II on Vascular Findings. [facility] Radiol 2013; 10:789-794 Electronically Signed   By: Lajean Manes M.D.   On: 11/06/2015 11:25   Dg Abd 1 View  Result Date: 11/06/2015 CLINICAL DATA:  Followup for intussusception. EXAM: ABDOMEN - 1 VIEW COMPARISON:  11/05/2015 FINDINGS: Nasogastric tube passes below the diaphragm into the proximal stomach.  There is mild small bowel dilation central abdomen. Air is seen within a normal caliber colon. There is some rectal air. Small bowel distention appears increased when compared to the previous day's study. IMPRESSION: 1. Mild small bowel dilation increased when compared to the previous day's study. 2. Nasogastric tube is well positioned. Electronically Signed   By: Lajean Manes M.D.   On: 11/06/2015 08:55   Dg Abd 1 View  Result Date: 11/05/2015 CLINICAL DATA:  Nasogastric tube placement.  Initial encounter. EXAM: ABDOMEN - 1 VIEW COMPARISON:  Abdominal radiograph performed 11/04/2015 FINDINGS: The patient's enteric tube is seen ending overlying the body of the stomach. The visualized bowel gas pattern is grossly unremarkable. No free intra-abdominal air is seen, though evaluation for free air is limited on a single supine view. Scattered clips are seen at the right upper quadrant. Right basilar airspace opacity may reflect atelectasis or pneumonia. A small right  pleural effusion is suspected. Prominent pleural calcification is noted at the medial right lung base. No acute osseous abnormalities are identified. Mild degenerative change is noted along the lower thoracic and lumbar spine. IMPRESSION: 1. Enteric tube seen ending overlying the body of the stomach. 2. Unremarkable bowel gas pattern; no free intra-abdominal air seen. 3. Right basilar airspace opacity may reflect atelectasis or pneumonia. Suspect small right pleural effusion. Electronically Signed   By: Garald Balding M.D.   On: 11/05/2015 06:47   Ct Abdomen Pelvis W Contrast  Result Date: 11/03/2015 CLINICAL DATA:  Intermittent left lower quadrant pain for several hours, initial encounter EXAM: CT ABDOMEN AND PELVIS WITH CONTRAST TECHNIQUE: Multidetector CT imaging of the abdomen and pelvis was performed using the standard protocol following bolus administration of intravenous contrast. CONTRAST:  46mL ISOVUE-300 IOPAMIDOL (ISOVUE-300) INJECTION 61% COMPARISON:  None. FINDINGS: Lower chest: Calcified pleural plaques are identified bilaterally. Mild bilateral lower lobe atelectatic changes are seen. No sizable effusion is noted. Hepatobiliary: Gallbladder is been surgically removed. The liver is within normal limits. Pancreas: There is 11 mm hypodensity noted in the head of the pancreas best seen on image number 31 of series 2. Although this may represent a pancreatic cyst the possibility of small pancreatic lesion could not be totally excluded. Spleen: Small splenic cyst is noted. Adrenals/Urinary Tract: No masses identified. No evidence of hydronephrosis. Stomach/Bowel: There is small bowel dilatation with some mild inflammatory changes surrounding the small bowel. This obstructive pattern continues into the right lower quadrant head which point there is an intussusception of the ileum within the ileum. No definitive mass lesion is seen. Small bowel beyond this intussusception is within normal limits.  Scattered small bowel diverticula are noted. A large fluid containing diverticulum of the duodenum is seen adjacent to the pancreatic head. Moderate-sized hiatal hernia is noted. Vascular/Lymphatic: No pathologically enlarged lymph nodes. No evidence of abdominal aortic aneurysm. Reproductive: No mass or other significant abnormality. Other: Mild free fluid is noted within the pelvis. Musculoskeletal: Postsurgical changes in the left hip are noted. Chronic compression deformities at T12 and L2 are noted IMPRESSION: Small-bowel obstruction secondary to an ileal to ileal intussusception in the right lower quadrant. Some inflammatory changes are noted surrounding the dilated loops in the left mid abdomen. No evidence of perforation is identified. Hypodensity within the head of the pancreas which may represent a complicated cyst although the possibility of a neoplasm could not be totally excluded. The need for further evaluation can be determined on a clinical basis. Bibasilar changes left greater than right likely representing some  acute on chronic infiltrate/atelectasis. Critical Value/emergent results were called by telephone at the time of interpretation on 11/03/2015 at 3:54 pm to Dr. Jola Schmidt , who verbally acknowledged these results. Electronically Signed   By: Inez Catalina M.D.   On: 11/03/2015 15:56   Dg Chest Port 1 View  Result Date: 11/10/2015 CLINICAL DATA:  Hypoxia EXAM: PORTABLE CHEST 1 VIEW COMPARISON:  November 09, 2015 FINDINGS: Endotracheal tube tip is 2.1 cm above the carina. Nasogastric tube tip and side port are in the stomach. No pneumothorax. There is a stable nodular appearing opacity in the right lower lobe, likely a granuloma. There is patchy atelectasis in both lower lobes. Foci of calcification are noted along each pleural surface, more on the right than on the left, findings consistent with either previous empyema or asbestos exposure. There is no new opacity. Heart is mildly enlarged  with pulmonary vascularity within normal limits. There is atherosclerotic calcification in the aorta. No adenopathy evident. There is degenerative change in each shoulder. IMPRESSION: Tube positions as described without pneumothorax. Bibasilar atelectasis. Areas of pleural calcification bilaterally, more on the right than on the left, stable. Probable granuloma right lower lobe. Aortic atherosclerosis. Stable cardiac prominence. No frank edema or consolidation evident. Electronically Signed   By: Lowella Grip III M.D.   On: 11/10/2015 08:42   Dg Chest Port 1 View  Result Date: 11/09/2015 CLINICAL DATA:  Intubation. EXAM: PORTABLE CHEST 1 VIEW COMPARISON:  11/08/2015 FINDINGS: Endotracheal tube has its tip 1 cm above the carina. Nasogastric tube enters the stomach. The heart is enlarged. There is aortic atherosclerosis. There is infiltrate and/or volume loss in the lower lungs bilaterally. Some of the pulmonary opacities are somewhat nodular and should be followed on subsequent exams. No effusions. IMPRESSION: Endotracheal tube tip 1 cm above the carina. Nasogastric tube enters stomach. Infiltrate and/or volume loss at both lung bases. Follow-up nodular shadows after treatment. Cardiomegaly.  Aortic atherosclerosis. Electronically Signed   By:  Chimes M.D.   On: 11/09/2015 18:21   Dg Chest Port 1 View  Result Date: 11/08/2015 CLINICAL DATA:  Hypertension.  Recent bowel intussusception EXAM: PORTABLE CHEST 1 VIEW COMPARISON:  November 07, 2015 FINDINGS: Nasogastric tube has been removed. There is patchy atelectasis in the right base region. Calcification on the right medially is likely due to prior emphysema, stable. No new opacity identified. Heart is upper normal in size with pulmonary vascularity within normal limits. There is atherosclerotic calcification aorta. Bones are osteoporotic. There is superior migration of each humeral head. IMPRESSION: Probable prior empyema on the right medially with  calcification. Right base atelectasis. Lungs elsewhere clear. Stable cardiac silhouette. There is aortic atherosclerosis. There is evidence of bilateral chronic rotator cuff tears. Electronically Signed   By: Lowella Grip III M.D.   On: 11/08/2015 09:31   Dg Chest Port 1 View  Result Date: 11/07/2015 CLINICAL DATA:  Nasogastric tube. EXAM: PORTABLE CHEST 1 VIEW COMPARISON:  11/04/2015. FINDINGS: NG tube noted coiled stomach. Cardiomegaly with pulmonary vascular prominence interstitial prominence. No focal infiltrate. Mild bibasilar atelectasis. No pleural effusion or pneumothorax. IMPRESSION: 1. NG tube noted coiled stomach. 2. Cardiomegaly with pulmonary interstitial prominence consistent with congestive heart failure and interstitial edema. Small left pleural effusion. 3. Low lung volumes with bibasilar atelectasis . Electronically Signed   By: Marcello Moores  Register   On: 11/07/2015 09:00   Dg Chest Port 1 View  Result Date: 11/04/2015 CLINICAL DATA:  Nasogastric tube placement.  Initial encounter. EXAM: PORTABLE CHEST  1 VIEW COMPARISON:  Chest radiograph performed 02/10/2014 FINDINGS: The patient's enteric tube is seen ending overlying the body of the stomach, with the side port about the gastroesophageal junction. This could be advanced several centimeters, as deemed clinically appropriate. The lungs are hypoexpanded. Bibasilar airspace opacities may reflect atelectasis or pneumonia. Small bilateral pleural effusions are suspected. No pneumothorax is seen. The cardiomediastinal silhouette is borderline normal in size. No acute osseous abnormalities are identified. Clips are noted within the right upper quadrant, reflecting prior cholecystectomy. IMPRESSION: 1. Enteric tube noted ending overlying the body of the stomach, with the side port about the gastroesophageal junction. This could be advanced several centimeters, as deemed clinically appropriate. 2. Lungs hypoexpanded. Bibasilar airspace opacities  may reflect atelectasis or pneumonia. Small bilateral pleural effusions suspected. Electronically Signed   By: Garald Balding M.D.   On: 11/04/2015 05:56   Dg Abd 2 Views  Result Date: 11/04/2015 CLINICAL DATA:  Check nasogastric catheter placement EXAM: ABDOMEN - 2 VIEW COMPARISON:  11/03/2015 FINDINGS: Scattered large and small bowel gas is noted. No obstructive changes seen. Bladder is well distended with contrast opacified urine. Left hip replacement is noted. A nasogastric catheter is noted within the stomach just beyond the gastroesophageal junction. No free air is seen. Pleural plaques are noted bilaterally. IMPRESSION: Nasogastric catheter within the stomach. The degree of small bowel dilatation has improved from the prior CT examination. Electronically Signed   By: Inez Catalina M.D.   On: 11/04/2015 08:17   Dg Abd Portable 1v  Result Date: 11/09/2015 CLINICAL DATA:  Small bowel obstruction EXAM: PORTABLE ABDOMEN - 1 VIEW COMPARISON:  November 08, 2015 FINDINGS: Nasogastric tube tip and side port are in proximal stomach. There remain multiple loops of dilated small bowel in a pattern concerning for a degree of obstruction. No free air evident. There is evidence of prior calcified empyema is in the lung base regions. IMPRESSION: Bowel gas pattern remains of concern for a degree of bowel obstruction. No free air. Nasogastric tube tip and side port in proximal stomach. Electronically Signed   By: Lowella Grip III M.D.   On: 11/09/2015 07:01   Dg Abd Portable 1v  Result Date: 11/08/2015 CLINICAL DATA:  NG tube placement EXAM: PORTABLE ABDOMEN - 1 VIEW COMPARISON:  November 08, 2015 FINDINGS: The NG tube terminates in the distal esophagus. Recommend advancement. Persistent small bowel obstruction. Evaluation for free air is limited on this study but none is definitely seen. Pleural calcifications again identified. Minimal opacity in the lateral right lung is not completely excluded. No other acute  abnormalities. IMPRESSION: 1. The NG tube side port is in the midesophagus and the distal tip is above the GE junction. Recommend advancement. 2. Persistent small bowel obstruction. 3. Calcified pleural plaques persists. These results will be called to the ordering clinician or representative by the Radiologist Assistant, and communication documented in the PACS or zVision Dashboard. Electronically Signed   By: Dorise Bullion III M.D   On: 11/08/2015 12:59   Dg Abd Portable 1v  Result Date: 11/08/2015 CLINICAL DATA:  Small-bowel obstruction EXAM: PORTABLE ABDOMEN - 1 VIEW COMPARISON:  11/07/2015 FINDINGS: There is again noted small-bowel obstruction similar to that seen on the prior exam. Nasogastric catheter is not well appreciated on this exam. No definitive free air is seen. Left hip replacement and degenerative change of the lumbar spine are again noted. IMPRESSION: Persistent small bowel obstruction Electronically Signed   By: Inez Catalina M.D.   On: 11/08/2015 09:35  Dg Abd Portable 1v  Result Date: 11/07/2015 CLINICAL DATA:  Abdominal distention. EXAM: PORTABLE ABDOMEN - 1 VIEW COMPARISON:  CT 11/06/2015.  Abdominal series 813 2017, 11/05/2015 FINDINGS: NG tube noted coiled in the stomach. Progressive distention of multiple loops of small bowel are noted. Paucity of colonic gas noted. No pneumatosis noted. No free air identified. Severe degenerative changes and scoliosis lumbar spine. Degenerative changes right hip. Left hip prosthesis. Diffuse osteopenia. Surgical sutures left groin. IMPRESSION: 1. NG tube noted coiled stomach. 2. Progressive distention of multiple loops of small bowel. Small bowel loops are noted to be dilated up to 4.9 cm. Findings consistent with small-bowel obstruction. Electronically Signed   By: Marcello Moores  Register   On: 11/07/2015 06:43   Dg Abd Portable 1v  Result Date: 11/03/2015 CLINICAL DATA:  Nasogastric tube placement. EXAM: PORTABLE ABDOMEN - 1 VIEW COMPARISON:  CT  11/03/2015 FINDINGS: Nasogastric tube has been placed, tip overlying the level of the proximal stomach and possibly within the hiatal hernia or proximal stomach. Bowel gas pattern shows persistent small bowel dilatation. Contrast is identified within the urinary bladder following CT exam. IMPRESSION: Nasogastric tube tip likely within the proximal stomach or hiatal hernia. Persistent dilatation of small bowel loops. Electronically Signed   By: Nolon Nations M.D.   On: 11/03/2015 17:03    Echocardiogram: 11/07/2015 Study Conclusions  - Left ventricle: The cavity size was normal. Wall thickness was   increased in a pattern of mild LVH. Indeterminant diastolic   function (atrial fibrillation). Systolic function was severely   reduced. The estimated ejection fraction was in the range of 25%   to 30%. Diffuse hypokinesis but lateral wall function looked   better than other walls. - Aortic valve: There was no stenosis. There was trivial   regurgitation. - Mitral valve: Mildly calcified annulus. There was no significant   regurgitation. - Left atrium: The atrium was mildly dilated. - Right ventricle: The cavity size was normal. Systolic function   was mildly to moderately reduced. - Tricuspid valve: Peak RV-RA gradient (S): 31 mm Hg. - Systemic veins: IVC was not visualized.  Impressions:  - The patient was in atrial fibrillation. Normal LV size with mild   LV hypertrophy. EF 25-30%. Lateral wall function looked   relatively preserved compared to other walls. Normal RV size with   mild to moderately decreased systolic function. Mild pulmonary   hypertension.  Assessment & Plan    1. Preoperative Clearance for Surgical Treatment of Small Bowel Obstruction - admitted with nausea, vomiting, and abdominal pain. CT Imaging showed small bowel obstruction secondary to an ileal to ileal intussusception in the right lower quadrant. NG tube placed on admission and has been replaced this  admission due to periods of confusion where the patient has pulled it out.  - underwent surgery on 8/16. No major perioperative events. Now on Full Liquid diet.  2. Paroxysmal Atrial Fibrillation - no history of this. Went into atrial fibrillation on 8/12 and has since had repeat episodes of PAF with return to NSR. TSH and Mg WNL.  - Now maintaining NSR, restarted on PTA Toprol-XL 25mg  daily. Would not titrate dosing at this time with borderline HR in mid-50's at times.  - This patients CHA2DS2-VASc Score and unadjusted Ischemic Stroke Rate (% per year) is equal to 4.8 % stroke rate/year from a score of 4(CHF, HTN, Age (2)). Reports having frequent falls. Would address the need for long-term anticoagulation with family once present and further out from surgery.  Due to age and known falls, not an ideal candidate.   3. Chronic Combined Systolic and Diastolic CHF - Presumed to be nonischemic cardiomyopathy (no ischemia on NST in 2010) per review of Dr. Harrington Challenger' notes. Echocardiogram this admission showsa reduced EF of 25-30% with diffuse HK, decreased from 45% in 2015. PTA medications include Toprol-XL 25mg  daily, Prinzide 10-12.5mg  daily, and Lasix 20-40mg  daily (alternating doses on days of the week).  - restarted on BB and Lisinopril. Does not appear overly volume overloaded on exam, but is recorded as +8.3L this admission (output yesterday recorded as only -575 cc). Will resume PO Lasix 20mg  daily.   4. Aspiration PNA - currently on Unasyn.  - per admitting team  5. Elevated Troponin - values flat at 0.15, 0.10, and 0.10. Likely secondary to demand ischemia in the post-operative setting and with recent atrial fibrillation. Post-op EKG without acute ischemic changes.   Arna Medici , PA-C 9:37 AM 11/14/2015 Pager: [phone]  The patient was seen, examined and discussed with Bernerd Pho, PA-C and I agree with the above.    82 year old male with h/o chronic combined  systolic and diastolic CHF (EF [id number] by echo in 2015) presumed to be nonischemic cardiomyopathy (no ischemia on NST in 2010), HTN, HLD admitted with SBO, s/p resection on 8/16, with post op a-fib, new dg, now resolved and in SR. He is asymptomatic, euvolemic, positive fluid balance this admission sec to volume resuscitation, restarted home metorpolol and lisinopril, we would recommend to restart low dose home lasix 20 mg po daily. We will sign off, call us with any questions.    Ena Dawley 11/14/2015

## 2015-11-14 NOTE — Evaluation (Addendum)
Physical Therapy Evaluation Patient Details Name: [name] MRN: [id number] DOB: 27-Nov-1921 Today's Date: 11/14/2015   History of Present Illness  82 y.o. male with medical history significant of cardiomyopathy, lung lobectomy in 1980s, Barretts esophagus, vertebral compression fracture, L hip hemiarthroplasty and admitted with Small bowel obstruction from bezoar s/p ex lap & evacuation 11/09/2015  Clinical Impression  Pt admitted with above diagnosis. Pt currently with functional limitations due to the deficits listed below (see PT Problem List).  Pt will benefit from skilled PT to increase their independence and safety with mobility to allow discharge to the venue listed below.   Pt requiring assist OOB however tolerated ambulation well.  Recommend initial assist for OOB for safety if possible at home, if not, would benefit from ST-SNF.  Pt states he lives with his son and he can assist however no family present at time of evaluation.     Follow Up Recommendations Supervision/Assistance - 24 hour;SNF    Equipment Recommendations  Rolling walker with 5" wheels    Recommendations for Other Services       Precautions / Restrictions Precautions Precautions: Fall Restrictions Weight Bearing Restrictions: No      Mobility  Bed Mobility Overal bed mobility: Needs Assistance Bed Mobility: Supine to Sit     Supine to sit: Min assist     General bed mobility comments: pt in chair, returned to chair  Transfers Overall transfer level: Needs assistance Equipment used: Rolling walker (2 wheeled) Transfers: Sit to/from Stand Sit to Stand: Min assist         General transfer comment: min assist to rise from recliner, assist to balance as pt moved hands from recliner to walker  Ambulation/Gait Ambulation/Gait assistance: Min assist;+2 safety/equipment Ambulation Distance (Feet): 220 Feet Assistive device: Rolling walker (2 wheeled) Gait Pattern/deviations: Step-through  pattern;Decreased stride length;Narrow base of support;Shuffle     General Gait Details: verbal cues for posture, shuffling type gait (RN states baseline per son if pt doesn't wear his shoes), recliner brought behind pt for safety  Stairs            Wheelchair Mobility    Modified Rankin (Stroke Patients Only)       Balance Overall balance assessment: Needs assistance   Sitting balance-Leahy Scale: Fair       Standing balance-Leahy Scale: Poor Standing balance comment: dependent on B UEs for balance                             Pertinent Vitals/Pain Pain Assessment: Faces Faces Pain Scale: Hurts little more Pain Location: buttocks with pericare Pain Descriptors / Indicators: Burning Pain Intervention(s): Repositioned (cleaned, NT notified of incontinence)    Home Living Family/patient expects to be discharged to:: Private residence Living Arrangements: Children (son) Available Help at Discharge: Family;Available PRN/intermittently (son works ) Type of Home: House       Home Layout: One level Home Equipment: Environmental consultant - 2 wheels Additional Comments: pt very HOH, difficulty understanding PLOF questions    Prior Function Level of Independence: Independent with assistive device(s)         Comments: per chart, very independent at baseline     Hand Dominance   Dominant Hand: Right    Extremity/Trunk Assessment   Upper Extremity Assessment: Generalized weakness           Lower Extremity Assessment: Defer to PT evaluation      Cervical / Trunk Assessment: Kyphotic  Communication   Communication: HOH (has B aids, but not very helpful)  Cognition Arousal/Alertness: Awake/alert Behavior During Therapy: WFL for tasks assessed/performed Overall Cognitive Status: Difficult to assess                      General Comments      Exercises        Assessment/Plan    PT Assessment Patient needs continued PT services  PT  Diagnosis Difficulty walking   PT Problem List Decreased mobility;Decreased activity tolerance;Decreased strength;Decreased knowledge of use of DME  PT Treatment Interventions DME instruction;Gait training;Functional mobility training;Therapeutic activities;Balance training;Therapeutic exercise   PT Goals (Current goals can be found in the Care Plan section) Acute Rehab PT Goals Patient Stated Goal: take a nap PT Goal Formulation: With patient Time For Goal Achievement: 11/21/15 Potential to Achieve Goals: Good    Frequency Min 3X/week   Barriers to discharge        Co-evaluation               End of Session Equipment Utilized During Treatment: Gait belt Activity Tolerance: Patient tolerated treatment well Patient left: in chair;with call bell/phone within reach;with chair alarm set Nurse Communication: Mobility status         Time: 1310-1336 PT Time Calculation (min) (ACUTE ONLY): 26 min   Charges:   PT Evaluation $PT Eval Moderate Complexity: 1 Procedure     PT G Codes:        Liannah Yarbough,KATHrine E 11/14/2015, 4:37 PM Carmelia Bake, PT, DPT 11/14/2015 Pager: [phone]

## 2015-11-14 NOTE — Progress Notes (Signed)
PROGRESS NOTE    [name]  [id number] DOB: 1921-12-15 DOA: 11/03/2015  PCP: Jenny Reichmann, MD   Brief Narrative:  [name] is a 82 y.o. male with medical history significant of cardiomyopathy, lung lobectomy in 1980s, Barretts esophagus, vertebral compression fracture who is quite independent at baseline and developed sudden onset abdominal pain and vomited once while at [employer] around 11 pm. No fever or chills.  Found to have intussusception of ileum. Also found to be hypoxic and thought to have aspirated while vomiting. While being in the hospital he has developed new onset paroxysmal  A-fib with RVR. HR has been difficult to control and is being managed with IV Cardizem in addition to IV Lopressor. Also found to have a new drop in EF from 45 to 25%.   Subjective: Alert, wants to get out. Denies pain.  Had multiple BM yesterday   Assessment & Plan:   Principal Problem:   Small bowel obstruction  - due to Bezoar of terminal ileum , near total obstruction proximal bowel distention with early near perforation of multiple jejunal diverticula. -  surgery following-  -enterotomy and removal of bezoar on 8-16. -NG tube removed. Moving bowel.  -advanced diet today. Awaiting for speech   Acute hypoxic resp failure - ? Aspiration pneumonia- started on Unasyn in ER and eventually was weaned to room air-  repeat CXR showed bibasilar opacities with hypo expended lungs- - cont Unasyn and O2 day 9, need 1 more days.  - PRN nebs - Xopenex as he has A-fib  Cough while eating; speech swallow evaluation.   Acute delirium - PRN Haldol and sitter- he does better when family is at bedside -improved.   Hypoglycemia; D 5 IV fluids. If diet started ok to discontnue fluids.   Diarrhea; resolved.   A-fib with RVR - no prior history of this - on  IV Metoprolol-  -Cardizem was discontinue.  - ECHO ordered-EF 25-30% -TSH normal./   Systolic CHF - chronic - previously  EF 45 %,  gr 1 d CHF - ECHO shows a new finding of EF of 25-30% with mild to mod right heart failure as well- mild Pulm HTN - on B blocker at this time- will need ACE I / ARB as tolerated if he recovers from SBO -cardiology was consulted for pre op evaluation.  -on oral lasix,.   AKI- pleural effusions  - due to dehydration/ hypotension? -NLS.  Stable.   Hypokalemia Replacing   Hypernatremia -resolved with changed IVF to lower sodium concentration  HTN - started on oral lasix, lisinopril.   Gastritis/ GERD? - Pepcid IV  Very hard of hearing - has appt for hearing aid on Friday   DVT prophylaxis: Lovenox Code Status: Full code Family Communication: son, Marya Amsler Disposition Plan: to be determined. Remain in the step down. PT consult.   Consultants:   gen surgery Procedures:    Antimicrobials:  Anti-infectives    Start     Dose/Rate Route Frequency Ordered Stop   11/12/15 0800  Ampicillin-Sulbactam (UNASYN) 3 g in sodium chloride 0.9 % 100 mL IVPB     3 g 100 mL/hr over 60 Minutes Intravenous Every 8 hours 11/12/15 0751     11/10/15 1600  Ampicillin-Sulbactam (UNASYN) 3 g in sodium chloride 0.9 % 100 mL IVPB     3 g 100 mL/hr over 60 Minutes Intravenous Every 6 hours 11/10/15 1152 11/11/15 0453   11/10/15 1000  Ampicillin-Sulbactam (UNASYN) 3 g in sodium  chloride 0.9 % 100 mL IVPB  Status:  Discontinued     3 g 100 mL/hr over 60 Minutes Intravenous Every 6 hours 11/10/15 0910 11/10/15 1151   11/07/15 1600  Ampicillin-Sulbactam (UNASYN) 3 g in sodium chloride 0.9 % 100 mL IVPB  Status:  Discontinued     3 g 100 mL/hr over 60 Minutes Intravenous Every 6 hours 11/07/15 1024 11/09/15 1731   11/06/15 2200  Ampicillin-Sulbactam (UNASYN) 3 g in sodium chloride 0.9 % 100 mL IVPB  Status:  Discontinued     3 g 100 mL/hr over 60 Minutes Intravenous Every 12 hours 11/06/15 1020 11/07/15 1024   11/03/15 1930  Ampicillin-Sulbactam (UNASYN) 3 g in sodium chloride 0.9 % 100 mL IVPB  Status:   Discontinued     3 g 100 mL/hr over 60 Minutes Intravenous Every 6 hours 11/03/15 1848 11/06/15 1020   11/03/15 1600  cefTRIAXone (ROCEPHIN) 1 g in dextrose 5 % 50 mL IVPB     1 g 100 mL/hr over 30 Minutes Intravenous  Once 11/03/15 1558 11/03/15 1636   11/03/15 1600  azithromycin (ZITHROMAX) 500 mg in dextrose 5 % 250 mL IVPB     500 mg 250 mL/hr over 60 Minutes Intravenous  Once 11/03/15 1558 11/03/15 1858       Objective: Vitals:   11/14/15 0800 11/14/15 0856 11/14/15 0900 11/14/15 1110  BP: (!) 139/49   (!) 131/57  Pulse: 62  74 87  Resp: 19  (!) 22 18  Temp: 97.7 F (36.5 C)   97.6 F (36.4 C)  TempSrc: Oral   Oral  SpO2: 96% (!) 89% 97% 97%  Weight:      Height:        Intake/Output Summary (Last 24 hours) at 11/14/15 1435 Last data filed at 11/14/15 1200  Gross per 24 hour  Intake              890 ml  Output               75 ml  Net              815 ml   Filed Weights   11/12/15 0400 11/13/15 0500 11/14/15 0500  Weight: 69.2 kg (152 lb 8.9 oz) 70.8 kg (156 lb 1.4 oz) 67.1 kg (147 lb 14.9 oz)    Examination: General exam: Appears comfortable  HEENT: PERRLA, oral mucosa moist, no sclera icterus or thrush Respiratory system: Clear to auscultation. Respiratory effort normal. Cardiovascular system: S1 & S2 heard, RRR.  No murmurs  Gastrointestinal system: Abdomen soft, non-tender, nondistended,  No organomegaly, incision with staples, no redness  healing  Central nervous system: Alert and oriented to place. No focal neurological deficits. Extremities: No cyanosis, clubbing or edema Skin: No rashes or ulcers Psychiatry:  Mood & affect appropriate.     Data Reviewed: I have personally reviewed following labs and imaging studies  CBC:  Recent Labs Lab 11/10/15 0425 11/11/15 0339 11/12/15 0800 11/13/15 0345 11/14/15 0838  WBC 16.7* 12.9* 12.1* 12.1* 11.1*  NEUTROABS  --  10.0*  --   --   --   HGB 12.3* 10.5* 11.1* 12.0* 11.5*  HCT 36.6* 30.5* 33.3*  36.1* 34.4*  MCV 89.1 88.4 91.7 89.4 90.1  PLT 287 302 332 399 [id number]*   Basic Metabolic Panel:  Recent Labs Lab 11/10/15 0745 11/10/15 1352 11/11/15 0339 11/12/15 0800 11/13/15 0345 11/14/15 0404  NA 140  --  141 145 143 143  K 4.4  4.3 3.6 3.8 3.8 3.2*  CL 117*  --  114* 114* 114* 111  CO2 18*  --  21* 22 21* 27  GLUCOSE 144*  --  98 71 92 95  BUN 22*  --  20 20 14 14   CREATININE 0.98  --  1.29* 1.07 0.98 0.91  CALCIUM 7.3*  --  7.3* 7.8* 7.8* 7.6*  MG 1.6* 2.1 1.9  --  1.8  --   PHOS 3.1  --  3.4  --   --   --    GFR: Estimated Creatinine Clearance: 45.1 mL/min (by C-G formula based on SCr of 0.91 mg/dL). Liver Function Tests:  Recent Labs Lab 11/11/15 0339  AST 10*  ALT 12*  ALKPHOS 54  BILITOT 0.9  PROT 4.3*  ALBUMIN 1.7*   No results for input(s): LIPASE, AMYLASE in the last 168 hours. No results for input(s): AMMONIA in the last 168 hours. Coagulation Profile: No results for input(s): INR, PROTIME in the last 168 hours. Cardiac Enzymes:  Recent Labs Lab 11/09/15 1803 11/10/15 0119 11/10/15 0425  TROPONINI 0.15* 0.10* 0.10*   BNP (last 3 results) No results for input(s): PROBNP in the last 8760 hours. HbA1C: No results for input(s): HGBA1C in the last 72 hours. CBG:  Recent Labs Lab 11/13/15 1640 11/13/15 1912 11/14/15 0008 11/14/15 0530 11/14/15 0741  GLUCAP 135* 106* 81 86 95   Lipid Profile:  Recent Labs  11/13/15 0345  TRIG 163*   Thyroid Function Tests: No results for input(s): TSH, T4TOTAL, FREET4, T3FREE, THYROIDAB in the last 72 hours. Anemia Panel: No results for input(s): VITAMINB12, FOLATE, FERRITIN, TIBC, IRON, RETICCTPCT in the last 72 hours. Urine analysis:    Component Value Date/Time   COLORURINE YELLOW 11/03/2015 [place] 11/03/2015 1420   LABSPEC 1.038 (H) 11/03/2015 1420   PHURINE 6.0 11/03/2015 1420   GLUCOSEU NEGATIVE 11/03/2015 1420   HGBUR NEGATIVE 11/03/2015 1420   BILIRUBINUR NEGATIVE  11/03/2015 1420   BILIRUBINUR neg 08/18/2013 0848   KETONESUR NEGATIVE 11/03/2015 1420   PROTEINUR NEGATIVE 11/03/2015 1420   UROBILINOGEN 0.2 11/30/2013 0804   NITRITE NEGATIVE 11/03/2015 1420   LEUKOCYTESUR NEGATIVE 11/03/2015 1420   Sepsis Labs: @LABRCNTIP (procalcitonin:4,lacticidven:4)  No results found for this or any previous visit (from the past 240 hour(s)).       Radiology Studies: No results found.    Scheduled Meds: . ampicillin-sulbactam (UNASYN) IV  3 g Intravenous Q8H  . enoxaparin (LOVENOX) injection  30 mg Subcutaneous Q24H  . furosemide  20 mg Oral Daily  . lisinopril  5 mg Oral Daily  . metoprolol succinate  25 mg Oral Daily   Continuous Infusions:     LOS: 11 days    Time spent in minutes: 35    Harlyn Italiano A, MD [facility] [email or web address] Password [email or web address] 11/14/2015, 2:35 PM

## 2015-11-14 NOTE — Progress Notes (Signed)
5 Days Post-Op  Subjective: Continues to improve Comfortable Tolerating po  Objective: Vital signs in last 24 hours: Temp:  [97.7 F (36.5 C)-98.3 F (36.8 C)] 97.7 F (36.5 C) (08/21 0800) Pulse Rate:  [28-91] 56 (08/21 0400) Resp:  [19-25] 20 (08/21 0400) BP: (103-149)/(59-88) 118/79 (08/21 0400) SpO2:  [90 %-99 %] 97 % (08/21 0400) Weight:  [67.1 kg (147 lb 14.9 oz)] 67.1 kg (147 lb 14.9 oz) (08/21 0500) Last BM Date: 11/13/15  Intake/Output from previous day: 08/20 0701 - 08/21 0700 In: 1450 [P.O.:980; IV Piggyback:350] Out: 575 [Urine:575] Intake/Output this shift: No intake/output data recorded.  Exam: Hard of hearing Abdomen soft, incision clean  Lab Results:   Recent Labs  11/12/15 0800 11/13/15 0345  WBC 12.1* 12.1*  HGB 11.1* 12.0*  HCT 33.3* 36.1*  PLT 332 399   BMET  Recent Labs  11/13/15 0345 11/14/15 0404  NA 143 143  K 3.8 3.2*  CL 114* 111  CO2 21* 27  GLUCOSE 92 95  BUN 14 14  CREATININE 0.98 0.91  CALCIUM 7.8* 7.6*   PT/INR No results for input(s): LABPROT, INR in the last 72 hours. ABG No results for input(s): PHART, HCO3 in the last 72 hours.  Invalid input(s): PCO2, PO2  Studies/Results: No results found.  Anti-infectives: Anti-infectives    Start     Dose/Rate Route Frequency Ordered Stop   11/12/15 0800  Ampicillin-Sulbactam (UNASYN) 3 g in sodium chloride 0.9 % 100 mL IVPB     3 g 100 mL/hr over 60 Minutes Intravenous Every 8 hours 11/12/15 0751     11/10/15 1600  Ampicillin-Sulbactam (UNASYN) 3 g in sodium chloride 0.9 % 100 mL IVPB     3 g 100 mL/hr over 60 Minutes Intravenous Every 6 hours 11/10/15 1152 11/11/15 0453   11/10/15 1000  Ampicillin-Sulbactam (UNASYN) 3 g in sodium chloride 0.9 % 100 mL IVPB  Status:  Discontinued     3 g 100 mL/hr over 60 Minutes Intravenous Every 6 hours 11/10/15 0910 11/10/15 1151   11/07/15 1600  Ampicillin-Sulbactam (UNASYN) 3 g in sodium chloride 0.9 % 100 mL IVPB  Status:   Discontinued     3 g 100 mL/hr over 60 Minutes Intravenous Every 6 hours 11/07/15 1024 11/09/15 1731   11/06/15 2200  Ampicillin-Sulbactam (UNASYN) 3 g in sodium chloride 0.9 % 100 mL IVPB  Status:  Discontinued     3 g 100 mL/hr over 60 Minutes Intravenous Every 12 hours 11/06/15 1020 11/07/15 1024   11/03/15 1930  Ampicillin-Sulbactam (UNASYN) 3 g in sodium chloride 0.9 % 100 mL IVPB  Status:  Discontinued     3 g 100 mL/hr over 60 Minutes Intravenous Every 6 hours 11/03/15 1848 11/06/15 1020   11/03/15 1600  cefTRIAXone (ROCEPHIN) 1 g in dextrose 5 % 50 mL IVPB     1 g 100 mL/hr over 30 Minutes Intravenous  Once 11/03/15 1558 11/03/15 1636   11/03/15 1600  azithromycin (ZITHROMAX) 500 mg in dextrose 5 % 250 mL IVPB     500 mg 250 mL/hr over 60 Minutes Intravenous  Once 11/03/15 1558 11/03/15 1858      Assessment/Plan: s/p Procedure(s): EXPLORATORY LAPAROTOMY (N/A)  Increase activity Advance diet [place] for floor from surgery standpoint  LOS: 11 days    Albin Duckett A 11/14/2015

## 2015-11-14 NOTE — Progress Notes (Signed)
Pt transferred to [facility] [place]. Receiving nurse was given full report. All pt belongings were transferred with patent including but not limited to hearing aid and glasses. Son Marya Amsler was called and given an update.  PT VSS upon transfer.

## 2015-11-14 NOTE — Evaluation (Signed)
Occupational Therapy Evaluation Patient Details Name: [name] MRN: [id number] DOB: [birth year] Today's Date: 11/14/2015    History of Present Illness 82 y.o. male with medical history significant of cardiomyopathy, lung lobectomy in 1980s, Barretts esophagus, vertebral compression fracture, L hip hemiarthroplasty and admitted with Small bowel obstruction from bezoar s/p ex lap & evacuation 11/09/2015   Clinical Impression   Per chart, pt was living with his son and able to stay by himself when his son was at work. Difficult to accurately assess cognition or PLOF due to significant hearing loss. Pt presents with generalized weakness and impaired balance interfering with ability to perform ADL and mobility and placing pt at high risk for falls. Pt with urinary incontinence upon OT's arrival requiring assist for change of clothing, pericare and linen change. Will follow acutely. Recommending SNF for short term rehab as pt does not have 24 hour care at home.   Follow Up Recommendations  SNF;Supervision/Assistance - 24 hour    Equipment Recommendations   (defer to next venue)    Recommendations for Other Services       Precautions / Restrictions Precautions Precautions: Fall Restrictions Weight Bearing Restrictions: No      Mobility Bed Mobility      General bed mobility comments: pt in chair, returned to chair  Transfers Overall transfer level: Needs assistance Equipment used: Rolling walker (2 wheeled) Transfers: Sit to/from Stand Sit to Stand: Min assist         General transfer comment: min assist to rise from recliner, assist to balance as pt moved hands from recliner to walker    Balance Overall balance assessment: Needs assistance   Sitting balance-Leahy Scale: Fair       Standing balance-Leahy Scale: Poor Standing balance comment: dependent on B UEs for balance                            ADL Overall ADL's : Needs  assistance/impaired Eating/Feeding: Supervision/ safety;Sitting   Grooming: Wash/dry hands;Wash/dry face;Sitting;Supervision/safety   Upper Body Bathing: Supervision/ safety;Sitting   Lower Body Bathing: Sit to/from stand;Maximal assistance   Upper Body Dressing : Maximal assistance;Standing Upper Body Dressing Details (indicate cue type and reason): doffed wet gown Lower Body Dressing: Maximal assistance;Sit to/from stand       Toileting- Water quality scientist and Hygiene: Sit to/from stand;Maximal assistance Toileting - Clothing Manipulation Details (indicate cue type and reason): max assist to wash and dry periarea after urinary incontinence             Vision Additional Comments: able to read clock and can see call button   Perception     Praxis      Pertinent Vitals/Pain Pain Assessment: Faces Faces Pain Scale: Hurts little more Pain Location: buttocks with pericare Pain Descriptors / Indicators: Burning Pain Intervention(s): Repositioned (cleaned, NT notified of incontinence)     Hand Dominance Right   Extremity/Trunk Assessment Upper Extremity Assessment Upper Extremity Assessment: Generalized weakness   Lower Extremity Assessment Lower Extremity Assessment: Defer to PT evaluation   Cervical / Trunk Assessment Cervical / Trunk Assessment: Kyphotic   Communication Communication Communication: HOH (has B aids, but not very helpful)   Cognition Arousal/Alertness: Awake/alert Behavior During Therapy: WFL for tasks assessed/performed Overall Cognitive Status: Difficult to assess                     General Comments       Exercises  Shoulder Instructions      Home Living Family/patient expects to be discharged to:: Private residence Living Arrangements: Children (son) Available Help at Discharge: Family;Available PRN/intermittently (son works ) Type of Home: House       Home Layout: One level               Home Equipment:  Environmental consultant - 2 wheels   Additional Comments: pt very HOH, difficulty understanding PLOF questions      Prior Functioning/Environment Level of Independence: Independent with assistive device(s)        Comments: per chart, very independent at baseline    OT Diagnosis: Generalized weakness   OT Problem List: Decreased strength;Decreased activity tolerance;Impaired balance (sitting and/or standing);Pain   OT Treatment/Interventions: Self-care/ADL training;DME and/or AE instruction;Patient/family education;Balance training    OT Goals(Current goals can be found in the care plan section) Acute Rehab OT Goals Patient Stated Goal: take a nap OT Goal Formulation: With patient Time For Goal Achievement: 11/28/15 Potential to Achieve Goals: Good ADL Goals Pt Will Perform Grooming: with supervision;standing Pt Will Perform Upper Body Bathing: with supervision;sitting Pt Will Perform Lower Body Bathing: with supervision;sit to/from stand Pt Will Perform Upper Body Dressing: with supervision;sitting Pt Will Perform Lower Body Dressing: with supervision;sit to/from stand Pt Will Transfer to Toilet: with supervision;ambulating Pt Will Perform Toileting - Clothing Manipulation and hygiene: with supervision;sit to/from stand  OT Frequency: Min 2X/week   Barriers to D/C: Decreased caregiver support          Co-evaluation              End of Session Equipment Utilized During Treatment: Rolling walker;Gait belt Nurse Communication:  (NT aware that pt does not have condom cath on)  Activity Tolerance: Patient tolerated treatment well Patient left: in chair;with call bell/phone within reach;with chair alarm set   Time: 1452-1511 OT Time Calculation (min): 19 min Charges:  OT General Charges $OT Visit: 1 Procedure OT Evaluation $OT Eval Moderate Complexity: 1 Procedure G-Codes:    Malka So 11/14/2015, 3:26 PM  [phone]

## 2015-11-15 ENCOUNTER — Encounter (INDEPENDENT_AMBULATORY_CARE_PROVIDER_SITE_OTHER): Payer: Medicare Other | Admitting: Ophthalmology

## 2015-11-15 LAB — BASIC METABOLIC PANEL
Anion gap: 5 (ref 5–15)
BUN: 12 mg/dL (ref 6–20)
CHLORIDE: 109 mmol/L (ref 101–111)
CO2: 28 mmol/L (ref 22–32)
Calcium: 7.8 mg/dL — ABNORMAL LOW (ref 8.9–10.3)
Creatinine, Ser: 1.17 mg/dL (ref 0.61–1.24)
GFR calc Af Amer: >60 mL/min (ref >60)
GFR calc non Af Amer: 52 mL/min — ABNORMAL LOW (ref >60)
GLUCOSE: 121 mg/dL — AB (ref 65–99)
POTASSIUM: 3.6 mmol/L (ref 3.5–5.1)
SODIUM: 142 mmol/L (ref 135–145)

## 2015-11-15 LAB — CBC
HEMATOCRIT: 32.8 % — AB (ref 39.0–52.0)
HEMOGLOBIN: 10.9 g/dL — AB (ref 13.0–17.0)
MCH: 30.3 pg (ref 26.0–34.0)
MCHC: 33.2 g/dL (ref 30.0–36.0)
MCV: 91.1 fL (ref 78.0–100.0)
Platelets: 405 10*3/uL — ABNORMAL HIGH (ref 150–400)
RBC: 3.6 MIL/uL — AB (ref 4.22–5.81)
RDW: 14.3 % (ref 11.5–15.5)
WBC: 12 10*3/uL — ABNORMAL HIGH (ref 4.0–10.5)

## 2015-11-15 LAB — GLUCOSE, CAPILLARY
GLUCOSE-CAPILLARY: 127 mg/dL — AB (ref 65–99)
GLUCOSE-CAPILLARY: 104 mg/dL — AB (ref 65–99)
Glucose-Capillary: 109 mg/dL — ABNORMAL HIGH (ref 65–99)
Glucose-Capillary: 113 mg/dL — ABNORMAL HIGH (ref 65–99)
Glucose-Capillary: 95 mg/dL (ref 65–99)

## 2015-11-15 MED ORDER — SODIUM CHLORIDE 0.9 % IV SOLN
3.0000 g | Freq: Four times a day (QID) | INTRAVENOUS | Status: DC
  Administered 2015-11-15 – 2015-11-16 (×4): 3 g via INTRAVENOUS
  Filled 2015-11-15 (×5): qty 3

## 2015-11-15 NOTE — Clinical Social Work Placement (Signed)
Patient has a bed at [facility]. CSW has completed FL2 & will continue to follow and assist with discharge when ready.    Raynaldo Opitz, [facility] Clinical Social Worker cell #: [phone]     CLINICAL SOCIAL WORK PLACEMENT  NOTE  Date:  11/15/2015  Patient Details  Name: [name] MRN: [id number] Date of Birth: [birth year]  Clinical Social Work is seeking post-discharge placement for this patient at the [place] level of care (*CSW will initial, date and re-position this form in  chart as items are completed):  Yes   Patient/family provided with [place] Work Department's list of facilities offering this level of care within the geographic area requested by the patient (or if unable, by the patient's family).  Yes   Patient/family informed of their freedom to choose among providers that offer the needed level of care, that participate in Medicare, Medicaid or managed care program needed by the patient, have an available bed and are willing to accept the patient.  Yes   Patient/family informed of [place]'s ownership interest in [facility] and [facility], as well as of the fact that they are under no obligation to receive care at these facilities.  PASRR submitted to EDS on       PASRR number received on       Existing PASRR number confirmed on 11/15/15     FL2 transmitted to all facilities in geographic area requested by pt/family on 11/15/15     FL2 transmitted to all facilities within larger geographic area on       Patient informed that his/her managed care company has contracts with or will negotiate with certain facilities, including the following:        Yes   Patient/family informed of bed offers received.  Patient chooses bed at [facility]     Physician recommends and patient chooses bed at      Patient to be transferred to [facility] on  .  Patient to be transferred to  facility by       Patient family notified on   of transfer.  Name of family member notified:        PHYSICIAN       Additional Comment:    _______________________________________________ Standley Brooking, LCSW 11/15/2015, 3:40 PM

## 2015-11-15 NOTE — Progress Notes (Signed)
Pharmacy Antibiotic Note  [name] is a 82 y.o. male admitted on 11/03/2015 with Aspiration PNA, received Rocephin/Zmax x1 in ED.  Pharmacy has been consulted for Unasyn dosing.  PMH cardiomyopathy, lung lobectomy 1980s  Completed 7 full days Tx on 8/17, however BL infiltrates on 8/16 CXR, so extending course to 10 days.  Note patient missed several doses on 8/18 as pharmacy was not consulted to extend course after setting 7 (full) day stop date.  Plan: ~Day 12 Unasyn  Continue Unasyn but will increase from 3 g IV q8 to 3g q6 for CrCl > 30   F/u SCr, clinical course  Height: 5\' 5"  (165.1 cm) Weight: 152 lb 8.9 oz (69.2 kg) IBW/kg (Calculated) : 61.5  Temp (24hrs), Avg:97.7 F (36.5 C), Min:97.6 F (36.4 C), Max:97.8 F (36.6 C)   Recent Labs Lab 11/09/15 1803 11/09/15 2020  11/11/15 0339 11/12/15 0800 11/13/15 0345 11/14/15 0404 11/14/15 0838 11/15/15 0604  WBC  --   --   < > 12.9* 12.1* 12.1*  --  11.1* 12.0*  CREATININE  --   --   < > 1.29* 1.07 0.98 0.91  --  1.17  LATICACIDVEN 0.7 0.8  --   --   --   --   --   --   --   < > = values in this interval not displayed.  Estimated Creatinine Clearance: 35 mL/min (by C-G formula based on SCr of 1.17 mg/dL).    Allergies  Allergen Reactions  . Ciprofloxacin Other (See Comments)    Reaction:  Unknown   . Penicillins Rash and Other (See Comments)    Tolerated Unasyn 11/03/15 Has patient had a PCN reaction causing immediate rash, facial/tongue/throat swelling, SOB or lightheadedness with hypotension: No Has patient had a PCN reaction causing severe rash involving mucus membranes or skin necrosis: No Has patient had a PCN reaction that required hospitalization No Has patient had a PCN reaction occurring within the last 10 years: No If all of the above answers are "NO", then may proceed with Cephalosporin use.    Antimicrobials this admission: R/Z x 1 8/10 Unasyn 8/10 PM >>   Dose adjustments this admission: 8/13  amps/sulb to 3gm q12h for inc in SCr 8/14: Change Unasyn to 3g IV q6 for CrCl > 30 8/19: Change Unasyn to 3g IV q8 for worsening CrCl  Microbiology results: 8/10 MRSA PCR: neg  Thank you for allowing pharmacy to be a part of this patient's care.   Adrian Saran, PharmD, BCPS Pager [phone] 11/15/2015 10:48 AM

## 2015-11-15 NOTE — Progress Notes (Signed)
PROGRESS NOTE    [name]  [id number] DOB: September 30, 1921 DOA: 11/03/2015  PCP: Jenny Reichmann, MD   Brief Narrative:  [name] is a 82 y.o. male with medical history significant of cardiomyopathy, lung lobectomy in 1980s, Barretts esophagus, vertebral compression fracture who is quite independent at baseline and developed sudden onset abdominal pain and vomited once while at [employer] around 11 pm. No fever or chills.  Found to have intussusception of ileum. Also found to be hypoxic and thought to have aspirated while vomiting. While being in the hospital he has developed new onset paroxysmal  A-fib with RVR. HR has been difficult to control and is being managed with IV Cardizem in addition to IV Lopressor. Also found to have a new drop in EF from 45 to 25%.   Subjective: He is alert, denies pain.  Had loose stool this am.    Assessment & Plan:   Principal Problem:   Small bowel obstruction  - due to Bezoar of terminal ileum , near total obstruction proximal bowel distention with early near perforation of multiple jejunal diverticula. -  surgery following-  -enterotomy and removal of bezoar on 8-16. -NG tube removed. Moving bowel.  -WBC increased today. Sx considering repeating CT scan to rule out abscess if WBC continue to increase/  -Had loose stool. Discussed with staff, if patient develos watery diarrhea, MD will need to be inform to consider C diff test.   Acute hypoxic resp failure - ? Aspiration pneumonia- started on Unasyn in ER and eventually was weaned to room air-  repeat CXR showed bibasilar opacities with hypo expended lungs- - cont Unasyn and O2 day 10 , last dose today. Need to discussed with Sx if they think patient need more IV antibiotics.  - PRN nebs - Xopenex as he has A-fib  Cough while eating; speech therapist following   Acute delirium - PRN Haldol and sitter- he does better when family is at bedside -improved.   Hypoglycemia; resolved. On  clear diet   Diarrhea; resolved.   A-fib with RVR - no prior history of this - on  IV Metoprolol-  -Cardizem was discontinue.  - ECHO ordered-EF 25-30% -TSH normal./   Systolic CHF - chronic - previously  EF 45 %, gr 1 d CHF - ECHO shows a new finding of EF of 25-30% with mild to mod right heart failure as well- mild Pulm HTN - on B blocker at this time- will need ACE I / ARB as tolerated if he recovers from SBO -cardiology was consulted for pre op evaluation.  -on oral lasix,.   AKI- pleural effusions  - due to dehydration/ hypotension? -NLS.  Stable.   Hypokalemia Resolved.    Hypernatremia -resolved with changed IVF to lower sodium concentration  HTN - started on oral lasix, lisinopril.   Gastritis/ GERD? - Pepcid IV  Very hard of hearing - has appt for hearing aid on Friday   DVT prophylaxis: Lovenox Code Status: Full code Family Communication: son, Marya Amsler Disposition Plan: to be determined. Remain in the step down. PT consult.   Consultants:   gen surgery Procedures:    Antimicrobials:  Anti-infectives    Start     Dose/Rate Route Frequency Ordered Stop   11/15/15 1400  Ampicillin-Sulbactam (UNASYN) 3 g in sodium chloride 0.9 % 100 mL IVPB     3 g 100 mL/hr over 60 Minutes Intravenous Every 6 hours 11/15/15 1050     11/12/15 0800  Ampicillin-Sulbactam (  UNASYN) 3 g in sodium chloride 0.9 % 100 mL IVPB  Status:  Discontinued     3 g 100 mL/hr over 60 Minutes Intravenous Every 8 hours 11/12/15 0751 11/15/15 1050   11/10/15 1600  Ampicillin-Sulbactam (UNASYN) 3 g in sodium chloride 0.9 % 100 mL IVPB     3 g 100 mL/hr over 60 Minutes Intravenous Every 6 hours 11/10/15 1152 11/11/15 0453   11/10/15 1000  Ampicillin-Sulbactam (UNASYN) 3 g in sodium chloride 0.9 % 100 mL IVPB  Status:  Discontinued     3 g 100 mL/hr over 60 Minutes Intravenous Every 6 hours 11/10/15 0910 11/10/15 1151   11/07/15 1600  Ampicillin-Sulbactam (UNASYN) 3 g in sodium chloride 0.9  % 100 mL IVPB  Status:  Discontinued     3 g 100 mL/hr over 60 Minutes Intravenous Every 6 hours 11/07/15 1024 11/09/15 1731   11/06/15 2200  Ampicillin-Sulbactam (UNASYN) 3 g in sodium chloride 0.9 % 100 mL IVPB  Status:  Discontinued     3 g 100 mL/hr over 60 Minutes Intravenous Every 12 hours 11/06/15 1020 11/07/15 1024   11/03/15 1930  Ampicillin-Sulbactam (UNASYN) 3 g in sodium chloride 0.9 % 100 mL IVPB  Status:  Discontinued     3 g 100 mL/hr over 60 Minutes Intravenous Every 6 hours 11/03/15 1848 11/06/15 1020   11/03/15 1600  cefTRIAXone (ROCEPHIN) 1 g in dextrose 5 % 50 mL IVPB     1 g 100 mL/hr over 30 Minutes Intravenous  Once 11/03/15 1558 11/03/15 1636   11/03/15 1600  azithromycin (ZITHROMAX) 500 mg in dextrose 5 % 250 mL IVPB     500 mg 250 mL/hr over 60 Minutes Intravenous  Once 11/03/15 1558 11/03/15 1858       Objective: Vitals:   11/14/15 0900 11/14/15 1110 11/14/15 2149 11/15/15 0412  BP:  (!) 131/57 (!) 142/71 127/70  Pulse: 74 87  74  Resp: (!) 22 18 18 18   Temp:  97.6 F (36.4 C) 97.7 F (36.5 C) 97.8 F (36.6 C)  TempSrc:  Oral Oral Oral  SpO2: 97% 97% 93% 93%  Weight:    69.2 kg (152 lb 8.9 oz)  Height:        Intake/Output Summary (Last 24 hours) at 11/15/15 1335 Last data filed at 11/15/15 0900  Gross per 24 hour  Intake              320 ml  Output              575 ml  Net             -255 ml   Filed Weights   11/13/15 0500 11/14/15 0500 11/15/15 0412  Weight: 70.8 kg (156 lb 1.4 oz) 67.1 kg (147 lb 14.9 oz) 69.2 kg (152 lb 8.9 oz)    Examination: General exam: Appears comfortable  HEENT: PERRLA, oral mucosa moist, no sclera icterus or thrush Respiratory system: Clear to auscultation. Respiratory effort normal. Cardiovascular system: S1 & S2 heard, RRR.  No murmurs  Gastrointestinal system: Abdomen soft, non-tender, nondistended,  No organomegaly, incision with staples, no redness  healing  Central nervous system: Alert and oriented to  place. No focal neurological deficits. Extremities: No cyanosis, clubbing or edema Skin: No rashes or ulcers Psychiatry:  Mood & affect appropriate.     Data Reviewed: I have personally reviewed following labs and imaging studies  CBC:  Recent Labs Lab 11/11/15 0339 11/12/15 0800 11/13/15 0345 11/14/15 [id number]  11/15/15 0604  WBC 12.9* 12.1* 12.1* 11.1* 12.0*  NEUTROABS 10.0*  --   --   --   --   HGB 10.5* 11.1* 12.0* 11.5* 10.9*  HCT 30.5* 33.3* 36.1* 34.4* 32.8*  MCV 88.4 91.7 89.4 90.1 91.1  PLT 302 332 399 405* 123456*   Basic Metabolic Panel:  Recent Labs Lab 11/10/15 0745 11/10/15 1352 11/11/15 0339 11/12/15 0800 11/13/15 0345 11/14/15 0404 11/15/15 0604  NA 140  --  141 145 143 143 142  K 4.4 4.3 3.6 3.8 3.8 3.2* 3.6  CL 117*  --  114* 114* 114* 111 109  CO2 18*  --  21* 22 21* 27 28  GLUCOSE 144*  --  98 71 92 95 121*  BUN 22*  --  20 20 14 14 12   CREATININE 0.98  --  1.29* 1.07 0.98 0.91 1.17  CALCIUM 7.3*  --  7.3* 7.8* 7.8* 7.6* 7.8*  MG 1.6* 2.1 1.9  --  1.8  --   --   PHOS 3.1  --  3.4  --   --   --   --    GFR: Estimated Creatinine Clearance: 35 mL/min (by C-G formula based on SCr of 1.17 mg/dL). Liver Function Tests:  Recent Labs Lab 11/11/15 0339  AST 10*  ALT 12*  ALKPHOS 54  BILITOT 0.9  PROT 4.3*  ALBUMIN 1.7*   No results for input(s): LIPASE, AMYLASE in the last 168 hours. No results for input(s): AMMONIA in the last 168 hours. Coagulation Profile: No results for input(s): INR, PROTIME in the last 168 hours. Cardiac Enzymes:  Recent Labs Lab 11/09/15 1803 11/10/15 0119 11/10/15 0425  TROPONINI 0.15* 0.10* 0.10*   BNP (last 3 results) No results for input(s): PROBNP in the last 8760 hours. HbA1C: No results for input(s): HGBA1C in the last 72 hours. CBG:  Recent Labs Lab 11/14/15 0530 11/14/15 0741 11/15/15 0102 11/15/15 0348 11/15/15 0801  GLUCAP 86 95 127* 109* 113*   Lipid Profile:  Recent Labs  11/13/15 0345   TRIG 163*   Thyroid Function Tests: No results for input(s): TSH, T4TOTAL, FREET4, T3FREE, THYROIDAB in the last 72 hours. Anemia Panel: No results for input(s): VITAMINB12, FOLATE, FERRITIN, TIBC, IRON, RETICCTPCT in the last 72 hours. Urine analysis:    Component Value Date/Time   COLORURINE YELLOW 11/03/2015 [place] 11/03/2015 1420   LABSPEC 1.038 (H) 11/03/2015 1420   PHURINE 6.0 11/03/2015 1420   GLUCOSEU NEGATIVE 11/03/2015 1420   HGBUR NEGATIVE 11/03/2015 1420   BILIRUBINUR NEGATIVE 11/03/2015 1420   BILIRUBINUR neg 08/18/2013 0848   KETONESUR NEGATIVE 11/03/2015 1420   PROTEINUR NEGATIVE 11/03/2015 1420   UROBILINOGEN 0.2 11/30/2013 0804   NITRITE NEGATIVE 11/03/2015 1420   LEUKOCYTESUR NEGATIVE 11/03/2015 1420   Sepsis Labs: @LABRCNTIP (procalcitonin:4,lacticidven:4)  No results found for this or any previous visit (from the past 240 hour(s)).       Radiology Studies: No results found.    Scheduled Meds: . ampicillin-sulbactam (UNASYN) IV  3 g Intravenous Q6H  . enoxaparin (LOVENOX) injection  30 mg Subcutaneous Q24H  . furosemide  20 mg Oral Daily  . lisinopril  5 mg Oral Daily  . metoprolol succinate  25 mg Oral Daily   Continuous Infusions:     LOS: 12 days    Time spent in minutes: 35    Jaleiyah Alas A, MD [facility] [email or web address] Password [email or web address] 11/15/2015, 1:35 PM

## 2015-11-15 NOTE — Clinical Social Work Placement (Signed)
   CLINICAL SOCIAL WORK PLACEMENT  NOTE  Date:  11/15/2015  Patient Details  Name: [name] MRN: [id number] Date of Birth: 29-Feb-1922  Clinical Social Work is seeking post-discharge placement for this patient at the [place] level of care (*CSW will initial, date and re-position this form in  chart as items are completed):  Yes   Patient/family provided with [place] Work Department's list of facilities offering this level of care within the geographic area requested by the patient (or if unable, by the patient's family).  Yes   Patient/family informed of their freedom to choose among providers that offer the needed level of care, that participate in Medicare, Medicaid or managed care program needed by the patient, have an available bed and are willing to accept the patient.  Yes   Patient/family informed of [place]'s ownership interest in [facility] and [facility], as well as of the fact that they are under no obligation to receive care at these facilities.  PASRR submitted to EDS on       PASRR number received on       Existing PASRR number confirmed on 11/15/15     FL2 transmitted to all facilities in geographic area requested by pt/family on 11/15/15     FL2 transmitted to all facilities within larger geographic area on       Patient informed that his/her managed care company has contracts with or will negotiate with certain facilities, including the following:            Patient/family informed of bed offers received.  Patient chooses bed at       Physician recommends and patient chooses bed at      Patient to be transferred to   on  .  Patient to be transferred to facility by       Patient family notified on   of transfer.  Name of family member notified:        PHYSICIAN       Additional Comment:    _______________________________________________ Standley Brooking, LCSW 11/15/2015, 2:03 PM

## 2015-11-15 NOTE — Clinical Social Work Note (Signed)
Clinical Social Work Assessment  Patient Details  Name: [name] MRN: [id number] Date of Birth: 1922-01-21  Date of referral:  11/15/15               Reason for consult:  Facility Placement                Permission sought to share information with:  Chartered certified accountant granted to share information::  Yes, Verbal Permission Granted  Name::        Agency::     Relationship::     Contact Information:     Housing/Transportation Living arrangements for the past 2 months:  Single Family Home Source of Information:  Adult Children Patient Interpreter Needed:  None Criminal Activity/Legal Involvement Pertinent to Current Situation/Hospitalization:  No - Comment as needed Significant Relationships:  Adult Children Lives with:  Adult Children Do you feel safe going back to the place where you live?  No Need for family participation in patient care:  Yes (Comment)  Care giving concerns:  CSW received consult for SNF placement.    Social Worker assessment / plan:  CSW spoke with patient's son, Marya Amsler via phone re: discharge planning. Son is agreeable with plan for SNF & requesting [facility]. CSW awaiting call back from [place] at [place] re: bed offer.   Employment status:  Retired Nurse, adult PT Recommendations:  [place], [place] / Referral to community resources:  [place]  Patient/Family's Response to care:  Patient's son states that he would really prefer that he go to [facility], did not want to think about a backup facility until [employer] was checked with first.   Patient/Family's Understanding of and Emotional Response to Diagnosis, Current Treatment, and Prognosis:    Emotional Assessment Appearance:  Appears stated age Attitude/Demeanor/Rapport:    Affect (typically observed):    Orientation:  Oriented to Self Alcohol / Substance use:    Psych involvement  (Current and /or in the community):     Discharge Needs  Concerns to be addressed:    Readmission within the last 30 days:    Current discharge risk:    Barriers to Discharge:      Standley Brooking, LCSW 11/15/2015, 2:01 PM

## 2015-11-15 NOTE — Progress Notes (Signed)
Speech Language Pathology Treatment: Dysphagia  Patient Details Name: LENORD HASHIMI MRN: [id number] DOB: [birth year] Today's Date: 11/15/2015 Time: [id number] SLP Time Calculation (min) (ACUTE ONLY): 20 min  Assessment / Plan / Recommendation Clinical Impression  SLP did not observe pt with solids given concern for WBC elevation for possible abscess per surgeon note.  Observed pt being given medicine with nectar thickened water by RN with good tolerance.  Pt also given thin water with appearance of timely swallow and clear voice throughout.  No indication of immediate difficulties but delayed cough noted - RN reports pt coughing up secretions.  Given extensive GI hx including Barrett's, SLP suspects delayed cough may be GI related.  Recommend advance to thin liquids with precautions - last CXR negative.  Pt denies dysphagia and repeated "relax" during session.  Due to pt upper dentures being at home and pt normally using them with po, when ready to advance to solids recommend Dys3.    HPI HPI: Ptis a 82 y.o.malewith medical history significant of cardiomyopathy, lung lobectomy in 1980s, Barretts esophagus, vertebral compression fracture who is quite independent at baseline and developed sudden onset abdominal pain and vomited once whileat [employer] around 11 pm. No fever or chills. Found to have intussusception of ileum. Also found to be hypoxic and thought to have aspirated while vomiting. While being in the hospital he has developed new onset paroxysmal A-fib with RVR. HR has been difficult to control and is being managed with IV Cardizem in addition to IV Lopressor. Also found to have a new drop in EF from 45 to 25%. Chest x ray with stable nodular appearing opacity in the right lower lobe, likely aspiration.  Pt is s/p laproscopic procedure, per surgeon note, pt with incr WBC and if continues may need CT Abdomen to r/o abscess.  SLP follow up to modify for readiness for dietary advancement and  assure tolerance.  Pt is very HOH and this limits communication.       SLP Plan  Continue with current plan of care     Recommendations  Diet recommendations: Thin liquid;Other(comment) (dys3 when advance solids) Medication Administration: Whole meds with liquid Supervision: Patient able to self feed Compensations: Slow rate;Small sips/bites Postural Changes and/or Swallow Maneuvers: Upright 30-60 min after meal;Seated upright 90 degrees             Oral Care Recommendations: Oral care BID Follow up Recommendations: None Plan: Continue with current plan of care     [place], [place], [facility] SLP [phone]

## 2015-11-15 NOTE — NC FL2 (Signed)
[place] LEVEL OF CARE SCREENING TOOL     IDENTIFICATION  Patient Name: [name] Birthdate: [birth year] Sex: male Admission Date (Current Location): 11/03/2015  [facility] and [place] Number:  Herbalist and Address:  [facility],  [place] [address] [place]      Provider Number: [id number]  Attending Physician Name and Address:  Elmarie Shiley, MD  Relative Name and Phone Number:       Current Level of Care: Hospital Recommended Level of Care: [place] Prior Approval Number:    Date Approved/Denied:   PASRR Number: [id number]  Discharge Plan: Home    Current Diagnoses: Patient Active Problem List   Diagnosis Date Noted  . 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 Cardiomyopathy ([place])   . Acute respiratory failure ([place])   . Small bowel obstruction from bezoar s/p ex lap & evacuation 11/09/2015 11/03/2015  . Intussusception ([place]) 11/03/2015  . Compression fracture of L2 lumbar vertebra ([place]) 03/16/2015  . Abrasion 03/05/2015  . Lipoma of back 03/01/2015  . Paroxysmal atrial fibrillation ([place]) 12/10/2013  . Hyponatremia 12/03/2013  . Leukocytosis, unspecified 12/02/2013  . Femoral neck fracture ([place]) 11/30/2013  . Acute on chronic renal failure ([place]) 11/30/2013  . Chronic combined systolic and diastolic CHF (congestive heart failure) ([place]) 11/30/2013  . A-fib ([place]) 07/11/2013  . Protein-calorie malnutrition, severe ([place]) 07/10/2013  . Acute encephalopathy 07/10/2013  . CAP (community acquired pneumonia) 07/09/2013  . CKD (chronic kidney disease) stage 2, GFR 60-89 ml/min 07/09/2013  . Metabolic acidosis [id number]  . Septic shock ([place]) 07/09/2013  . BPH (benign prostatic hyperplasia) 02/04/2012  . MELENA 08/01/2009  . TINEA CORPORIS 06/27/2009  . Hyperlipemia 06/27/2009  . TREMOR, ESSENTIAL 06/27/2009  . Essential hypertension 06/27/2009  . TOBACCO USE, QUIT 06/27/2009   . PERSONAL HX COLONIC POLYPS 02/01/2009  . DIARRHEA 01/31/2009  . GERD 11/19/2007  . BARRETTS ESOPHAGUS 11/19/2007  . GASTRITIS 11/19/2007  . HIATAL HERNIA 11/19/2007  . DIVERTICULOSIS, COLON 11/19/2007    Orientation RESPIRATION BLADDER Height & Weight     Self, Situation, Place, Time  Normal Incontinent Weight: 152 lb 8.9 oz (69.2 kg) Height:  5\' 5"  (165.1 cm)  BEHAVIORAL SYMPTOMS/MOOD NEUROLOGICAL BOWEL NUTRITION STATUS      Incontinent Diet (Full Liquid)  AMBULATORY STATUS COMMUNICATION OF NEEDS Skin   Extensive Assist Verbally Normal                       Personal Care Assistance Level of Assistance  Bathing, Dressing Bathing Assistance: Limited assistance   Dressing Assistance: Limited assistance     Functional Limitations Info             SPECIAL CARE FACTORS FREQUENCY  PT (By licensed PT), OT (By licensed OT)     PT Frequency: 5 OT Frequency: 5            Contractures      Additional Factors Info  Code Status, Allergies Code Status Info: Fullcode Allergies Info: Allergies:  Ciprofloxacin, Penicillins           Current Medications (11/15/2015):  This is the current hospital active medication list Current Facility-Administered Medications  Medication Dose Route Frequency Provider Last Rate Last Dose  . 0.9 %  sodium chloride infusion  250 mL Intravenous PRN [name], MD   Stopped at 11/12/15 1134  . acetaminophen (TYLENOL) tablet 650 mg  650 mg Oral Q6H  PRN Belkys A Regalado, MD      . Ampicillin-Sulbactam (UNASYN) 3 g in sodium chloride 0.9 % 100 mL IVPB  3 g Intravenous Q6H Adrian Saran, RPH      . enoxaparin (LOVENOX) injection 30 mg  30 mg Subcutaneous Q24H [name], MD   30 mg at 11/14/15 1600  . fentaNYL (SUBLIMAZE) injection 12.5 mcg  12.5 mcg Intravenous Q3H PRN Belkys A Regalado, MD   12.5 mcg at 11/15/15 0106  . furosemide (LASIX) tablet 20 mg  20 mg Oral Daily Erma Heritage, [place]   20 mg at 11/15/15 [id number]  .  haloperidol lactate (HALDOL) injection 0.5 mg  0.5 mg Intravenous Q6H PRN Belkys A Regalado, MD      . lisinopril (PRINIVIL,ZESTRIL) tablet 5 mg  5 mg Oral Daily Satira Sark, MD   5 mg at 11/15/15 [id number]  . metoprolol succinate (TOPROL-XL) 24 hr tablet 25 mg  25 mg Oral Daily Satira Sark, MD   25 mg at 11/15/15 [id number]  . ondansetron (ZOFRAN) injection 4 mg  4 mg Intravenous Q6H PRN [name], MD      . RESOURCE THICKENUP CLEAR   Oral PRN Elmarie Shiley, MD         Discharge Medications: Please see discharge summary for a list of discharge medications.  Relevant Imaging Results:  Relevant Lab Results:   Additional Information SSN: [id number]  Standley Brooking, LCSW

## 2015-11-15 NOTE — Care Management Important Message (Signed)
Important Message  Patient Details  Name: [name] MRN: [id number] Date of Birth: [birth year]   Medicare Important Message Given:  Yes    Camillo Flaming 11/15/2015, 9:07 AMImportant Message  Patient Details  Name: [name] MRN: [id number] Date of Birth: 12-11-1921   Medicare Important Message Given:  Yes    Camillo Flaming 11/15/2015, 9:06 AM

## 2015-11-15 NOTE — Progress Notes (Signed)
6 Days Post-Op  Subjective: Having loose bm's Some increase abd pain  Objective: Vital signs in last 24 hours: Temp:  [97.6 F (36.4 C)-97.8 F (36.6 C)] 97.8 F (36.6 C) (08/22 0412) Pulse Rate:  [62-87] 74 (08/22 0412) Resp:  [18-22] 18 (08/22 0412) BP: (127-142)/(49-71) 127/70 (08/22 0412) SpO2:  [89 %-97 %] 93 % (08/22 0412) Weight:  [69.2 kg (152 lb 8.9 oz)] 69.2 kg (152 lb 8.9 oz) (08/22 0412) Last BM Date: 11/14/15  Intake/Output from previous day: 08/21 0701 - 08/22 0700 In: 520 [P.O.:120; I.V.:50; IV Piggyback:350] Out: 575 [Urine:575] Intake/Output this shift: No intake/output data recorded.  Abdomen a little full but not tender Incision clean  Lab Results:   Recent Labs  11/14/15 0838 11/15/15 0604  WBC 11.1* 12.0*  HGB 11.5* 10.9*  HCT 34.4* 32.8*  PLT 405* 405*   BMET  Recent Labs  11/14/15 0404 11/15/15 0604  NA 143 142  K 3.2* 3.6  CL 111 109  CO2 27 28  GLUCOSE 95 121*  BUN 14 12  CREATININE 0.91 1.17  CALCIUM 7.6* 7.8*   PT/INR No results for input(s): LABPROT, INR in the last 72 hours. ABG No results for input(s): PHART, HCO3 in the last 72 hours.  Invalid input(s): PCO2, PO2  Studies/Results: No results found.  Anti-infectives: Anti-infectives    Start     Dose/Rate Route Frequency Ordered Stop   11/12/15 0800  Ampicillin-Sulbactam (UNASYN) 3 g in sodium chloride 0.9 % 100 mL IVPB     3 g 100 mL/hr over 60 Minutes Intravenous Every 8 hours 11/12/15 0751     11/10/15 1600  Ampicillin-Sulbactam (UNASYN) 3 g in sodium chloride 0.9 % 100 mL IVPB     3 g 100 mL/hr over 60 Minutes Intravenous Every 6 hours 11/10/15 1152 11/11/15 0453   11/10/15 1000  Ampicillin-Sulbactam (UNASYN) 3 g in sodium chloride 0.9 % 100 mL IVPB  Status:  Discontinued     3 g 100 mL/hr over 60 Minutes Intravenous Every 6 hours 11/10/15 0910 11/10/15 1151   11/07/15 1600  Ampicillin-Sulbactam (UNASYN) 3 g in sodium chloride 0.9 % 100 mL IVPB  Status:   Discontinued     3 g 100 mL/hr over 60 Minutes Intravenous Every 6 hours 11/07/15 1024 11/09/15 1731   11/06/15 2200  Ampicillin-Sulbactam (UNASYN) 3 g in sodium chloride 0.9 % 100 mL IVPB  Status:  Discontinued     3 g 100 mL/hr over 60 Minutes Intravenous Every 12 hours 11/06/15 1020 11/07/15 1024   11/03/15 1930  Ampicillin-Sulbactam (UNASYN) 3 g in sodium chloride 0.9 % 100 mL IVPB  Status:  Discontinued     3 g 100 mL/hr over 60 Minutes Intravenous Every 6 hours 11/03/15 1848 11/06/15 1020   11/03/15 1600  cefTRIAXone (ROCEPHIN) 1 g in dextrose 5 % 50 mL IVPB     1 g 100 mL/hr over 30 Minutes Intravenous  Once 11/03/15 1558 11/03/15 1636   11/03/15 1600  azithromycin (ZITHROMAX) 500 mg in dextrose 5 % 250 mL IVPB     500 mg 250 mL/hr over 60 Minutes Intravenous  Once 11/03/15 1558 11/03/15 1858      Assessment/Plan: s/p Procedure(s): EXPLORATORY LAPAROTOMY (N/A)  WBC going up.  If it continues, may need follow up CT to r/o abscess but will hold for now  LOS: 12 days    Markiya Keefe A 11/15/2015

## 2015-11-16 DIAGNOSIS — J189 Pneumonia, unspecified organism: Secondary | ICD-10-CM | POA: Diagnosis not present

## 2015-11-16 DIAGNOSIS — J690 Pneumonitis due to inhalation of food and vomit: Secondary | ICD-10-CM | POA: Diagnosis not present

## 2015-11-16 DIAGNOSIS — Z5189 Encounter for other specified aftercare: Secondary | ICD-10-CM | POA: Diagnosis not present

## 2015-11-16 DIAGNOSIS — N182 Chronic kidney disease, stage 2 (mild): Secondary | ICD-10-CM

## 2015-11-16 DIAGNOSIS — Z79899 Other long term (current) drug therapy: Secondary | ICD-10-CM | POA: Diagnosis not present

## 2015-11-16 DIAGNOSIS — H9190 Unspecified hearing loss, unspecified ear: Secondary | ICD-10-CM

## 2015-11-16 DIAGNOSIS — E43 Unspecified severe protein-calorie malnutrition: Secondary | ICD-10-CM

## 2015-11-16 DIAGNOSIS — K22719 Barrett's esophagus with dysplasia, unspecified: Secondary | ICD-10-CM | POA: Diagnosis not present

## 2015-11-16 DIAGNOSIS — R1312 Dysphagia, oropharyngeal phase: Secondary | ICD-10-CM | POA: Diagnosis not present

## 2015-11-16 DIAGNOSIS — R5381 Other malaise: Secondary | ICD-10-CM | POA: Diagnosis not present

## 2015-11-16 DIAGNOSIS — I482 Chronic atrial fibrillation: Secondary | ICD-10-CM | POA: Diagnosis not present

## 2015-11-16 DIAGNOSIS — E876 Hypokalemia: Secondary | ICD-10-CM | POA: Diagnosis not present

## 2015-11-16 DIAGNOSIS — T189XXS Foreign body of alimentary tract, part unspecified, sequela: Secondary | ICD-10-CM | POA: Diagnosis not present

## 2015-11-16 DIAGNOSIS — I5023 Acute on chronic systolic (congestive) heart failure: Secondary | ICD-10-CM | POA: Diagnosis not present

## 2015-11-16 DIAGNOSIS — D62 Acute posthemorrhagic anemia: Secondary | ICD-10-CM | POA: Diagnosis not present

## 2015-11-16 DIAGNOSIS — I480 Paroxysmal atrial fibrillation: Secondary | ICD-10-CM | POA: Diagnosis not present

## 2015-11-16 DIAGNOSIS — T189XXD Foreign body of alimentary tract, part unspecified, subsequent encounter: Secondary | ICD-10-CM | POA: Diagnosis not present

## 2015-11-16 DIAGNOSIS — J9601 Acute respiratory failure with hypoxia: Secondary | ICD-10-CM | POA: Diagnosis not present

## 2015-11-16 DIAGNOSIS — R2681 Unsteadiness on feet: Secondary | ICD-10-CM | POA: Diagnosis not present

## 2015-11-16 DIAGNOSIS — K5669 Other intestinal obstruction: Secondary | ICD-10-CM | POA: Diagnosis not present

## 2015-11-16 DIAGNOSIS — J9690 Respiratory failure, unspecified, unspecified whether with hypoxia or hypercapnia: Secondary | ICD-10-CM | POA: Diagnosis not present

## 2015-11-16 DIAGNOSIS — M6281 Muscle weakness (generalized): Secondary | ICD-10-CM | POA: Diagnosis not present

## 2015-11-16 DIAGNOSIS — I5022 Chronic systolic (congestive) heart failure: Secondary | ICD-10-CM | POA: Diagnosis not present

## 2015-11-16 LAB — BASIC METABOLIC PANEL
Anion gap: 5 (ref 5–15)
BUN: 10 mg/dL (ref 6–20)
CALCIUM: 7.9 mg/dL — AB (ref 8.9–10.3)
CHLORIDE: 105 mmol/L (ref 101–111)
CO2: 32 mmol/L (ref 22–32)
CREATININE: 1.06 mg/dL (ref 0.61–1.24)
GFR calc Af Amer: >60 mL/min (ref >60)
GFR, EST NON AFRICAN AMERICAN: 59 mL/min — AB (ref >60)
GLUCOSE: 100 mg/dL — AB (ref 65–99)
Potassium: 3.4 mmol/L — ABNORMAL LOW (ref 3.5–5.1)
Sodium: 142 mmol/L (ref 135–145)

## 2015-11-16 LAB — CBC
HEMATOCRIT: 33.6 % — AB (ref 39.0–52.0)
HEMOGLOBIN: 11 g/dL — AB (ref 13.0–17.0)
MCH: 29.3 pg (ref 26.0–34.0)
MCHC: 32.7 g/dL (ref 30.0–36.0)
MCV: 89.4 fL (ref 78.0–100.0)
Platelets: 382 10*3/uL (ref 150–400)
RBC: 3.76 MIL/uL — ABNORMAL LOW (ref 4.22–5.81)
RDW: 14.1 % (ref 11.5–15.5)
WBC: 11.3 10*3/uL — ABNORMAL HIGH (ref 4.0–10.5)

## 2015-11-16 LAB — C DIFFICILE QUICK SCREEN W PCR REFLEX
C Diff antigen: NEGATIVE
C Diff interpretation: NOT DETECTED
C Diff toxin: NEGATIVE

## 2015-11-16 LAB — GLUCOSE, CAPILLARY
GLUCOSE-CAPILLARY: 78 mg/dL (ref 65–99)
Glucose-Capillary: 103 mg/dL — ABNORMAL HIGH (ref 65–99)

## 2015-11-16 MED ORDER — TRAMADOL-ACETAMINOPHEN 37.5-325 MG PO TABS: 1.0000 | ORAL_TABLET | Freq: Four times a day (QID) | ORAL | 0 refills | Status: DC | PRN

## 2015-11-16 MED ORDER — APIXABAN 5 MG PO TABS: 5.0000 mg | ORAL_TABLET | Freq: Two times a day (BID) | ORAL | 0 refills | Status: DC

## 2015-11-16 MED ORDER — APIXABAN 5 MG PO TABS
5.0000 mg | ORAL_TABLET | ORAL | Status: AC
  Administered 2015-11-16: 5 mg via ORAL
  Filled 2015-11-16: qty 1

## 2015-11-16 MED ORDER — RESOURCE THICKENUP CLEAR PO POWD: 1.0000 | ORAL | 0 refills | Status: DC | PRN

## 2015-11-16 MED ORDER — LISINOPRIL 5 MG PO TABS: 5.0000 mg | ORAL_TABLET | Freq: Every day | ORAL | 0 refills | Status: DC

## 2015-11-16 MED ORDER — POTASSIUM CHLORIDE CRYS ER 20 MEQ PO TBCR
40.0000 meq | EXTENDED_RELEASE_TABLET | Freq: Once | ORAL | Status: AC
  Administered 2015-11-16: 40 meq via ORAL
  Filled 2015-11-16: qty 2

## 2015-11-16 NOTE — Progress Notes (Signed)
Occupational Therapy Treatment Patient Details Name: [name] MRN: [id number] DOB: Dec 16, 1921 Today's Date: 11/16/2015    History of present illness 82 y.o. male with medical history significant of cardiomyopathy, lung lobectomy in 1980s, Barretts esophagus, vertebral compression fracture, L hip hemiarthroplasty and admitted with Small bowel obstruction from bezoar s/p ex lap & evacuation 11/09/2015   OT comments  Pt requires mod - max A overall with ADLs.  He does fatigue quickly  Will continue to follow.   Follow Up Recommendations  SNF;Supervision/Assistance - 24 hour    Equipment Recommendations  None recommended by OT    Recommendations for Other Services      Precautions / Restrictions Precautions Precautions: Fall       Mobility Bed Mobility Overal bed mobility: Needs Assistance Bed Mobility: Supine to Sit     Supine to sit: Mod assist     General bed mobility comments: Pt very slow to initiate movement.  Requires assist to move LEs off bed and to lift trunk   Transfers Overall transfer level: Needs assistance Equipment used: Rolling walker (2 wheeled) Transfers: Stand Pivot Transfers;Sit to/from Stand Sit to Stand: Min assist Stand pivot transfers: Min assist       General transfer comment: min A to move sit to stand and for balance     Balance Overall balance assessment: Needs assistance Sitting-balance support: Feet supported Sitting balance-Leahy Scale: Fair     Standing balance support: Bilateral upper extremity supported Standing balance-Leahy Scale: Poor                     ADL Overall ADL's : Needs assistance/impaired                         Toilet Transfer: Moderate assistance;Ambulation;Comfort height toilet;RW Statistician Details (indicate cue type and reason): initially required min A, but progresed to mod A as he fatigued  Toileting- Clothing Manipulation and Hygiene: Maximal assistance;Sit to/from  stand Toileting - Clothing Manipulation Details (indicate cue type and reason): Pt with urinary urgency      Functional mobility during ADLs: Minimal assistance;Moderate assistance;Rolling walker        Vision                     Perception     Praxis      Cognition   Behavior During Therapy: WFL for tasks assessed/performed Overall Cognitive Status: Within Functional Limits for tasks assessed                       Extremity/Trunk Assessment               Exercises     Shoulder Instructions       General Comments      Pertinent Vitals/ Pain       Pain Assessment: No/denies pain  Home Living                                          Prior Functioning/Environment              Frequency Min 2X/week     Progress Toward Goals  OT Goals(current goals can now be found in the care plan section)  Progress towards OT goals: Progressing toward goals  ADL Goals Pt Will Perform Grooming: with supervision;standing Pt Will  Perform Upper Body Bathing: with supervision;sitting Pt Will Perform Lower Body Bathing: with supervision;sit to/from stand Pt Will Perform Upper Body Dressing: with supervision;sitting Pt Will Perform Lower Body Dressing: with supervision;sit to/from stand Pt Will Transfer to Toilet: with supervision;ambulating Pt Will Perform Toileting - Clothing Manipulation and hygiene: with supervision;sit to/from stand  Plan Discharge plan remains appropriate    Co-evaluation                 End of Session Equipment Utilized During Treatment: Rolling walker;Gait belt   Activity Tolerance Patient tolerated treatment well   Patient Left in chair;with call bell/phone within reach;with chair alarm set;with nursing/sitter in room   Nurse Communication          Time: [id number] OT Time Calculation (min): 35 min  Charges: OT General Charges $OT Visit: 1 Procedure OT Treatments $Self Care/Home Management  : 8-22 mins $Therapeutic Activity: 23-37 mins  Theia Dezeeuw M 11/16/2015, 3:29 PM

## 2015-11-16 NOTE — Progress Notes (Signed)
SLP Cancellation Note  Patient Details Name: ARDI ALLING MRN: [id number] DOB: [birth year]   Cancelled treatment:       Reason Eval/Treat Not Completed: Other (comment) (pt needing to be cleaned up )   Luanna Salk, [facility] SLP [phone]

## 2015-11-16 NOTE — Care Management Note (Signed)
Case Management Note  Patient Details  Name: [name] MRN: [id number] Date of Birth: Aug 19, 1921  Subjective/Objective:                    Action/Plan:d/c SNF.   Expected Discharge Date:   (unknown)               Expected Discharge Plan:  Skilled Nursing Facility  In-House Referral:  Clinical Social Work  Discharge planning Services  CM Consult  Post Acute Care Choice:    Choice offered to:     DME Arranged:    DME Agency:     HH Arranged:    [place] Agency:     Status of Service:  Completed, signed off  If discussed at [employer], dates discussed:    Additional Comments:  Dessa Phi, RN 11/16/2015, 3:36 PM

## 2015-11-16 NOTE — Discharge Instructions (Signed)
[facility]      [facility] [phone]  OPEN ABDOMINAL SURGERY: POST OP INSTRUCTIONS  Always review your discharge instruction sheet given to you by the facility where your surgery was performed.  IF YOU HAVE DISABILITY OR FAMILY LEAVE FORMS, YOU MUST BRING THEM TO THE OFFICE FOR PROCESSING.  PLEASE DO NOT GIVE THEM TO YOUR DOCTOR.  1. A prescription for pain medication may be given to you upon discharge.  Take your pain medication as prescribed, if needed.  If narcotic pain medicine is not needed, then you may take acetaminophen (Tylenol) or ibuprofen (Advil) as needed. 2. Take your usually prescribed medications unless otherwise directed. 3. If you need a refill on your pain medication, please contact your pharmacy. They will contact our office to request authorization.  Prescriptions will not be filled after 5pm or on week-ends. 4. You should follow a light diet the first few days after arrival home, such as soup and crackers, pudding, etc.unless your doctor has advised otherwise. A high-fiber, low fat diet can be resumed as tolerated.   Be sure to include lots of fluids daily. Most patients will experience some swelling and bruising on the chest and neck area.  Ice packs will help.  Swelling and bruising can take several days to resolve 5. Most patients will experience some swelling and bruising in the area of the incision. Ice pack will help. Swelling and bruising can take several days to resolve..  6. It is common to experience some constipation if taking pain medication after surgery.  Increasing fluid intake and taking a stool softener will usually help or prevent this problem from occurring.  A mild laxative (Milk of Magnesia or Miralax) should be taken according to package directions if there are no bowel movements after 48 hours. 7.  You may have steri-strips (small skin tapes) in place directly over the incision.  These strips should be left on the skin for 7-10 days.  If your  surgeon used skin glue on the incision, you may shower in 24 hours.  The glue will flake off over the next 2-3 weeks.  Any sutures or staples will be removed at the office during your follow-up visit. You may find that a light gauze bandage over your incision may keep your staples from being rubbed or pulled. You may shower and replace the bandage daily. 8. ACTIVITIES:  You may resume regular (light) daily activities beginning the next day--such as daily self-care, walking, climbing stairs--gradually increasing activities as tolerated.  You may have sexual intercourse when it is comfortable.  Refrain from any heavy lifting or straining until approved by your doctor. a. You may drive when you no longer are taking prescription pain medication, you can comfortably wear a seatbelt, and you can safely maneuver your car and apply brakes b. Return to Work: ___________________________________ 9. You should see your doctor in the office for a follow-up appointment approximately two weeks after your surgery.  Make sure that you call for this appointment within a day or two after you arrive home to insure a convenient appointment time. OTHER INSTRUCTIONS:  _____________________________________________________________ _____________________________________________________________  WHEN TO CALL YOUR DOCTOR: 1. Fever over 101.0 2. Inability to urinate 3. Nausea and/or vomiting 4. Extreme swelling or bruising 5. Continued bleeding from incision. 6. Increased pain, redness, or drainage from the incision. 7. Difficulty swallowing or breathing 8. Muscle cramping or spasms. 9. Numbness or tingling in hands or feet or around lips.  The clinic staff is available to   answer your questions during regular business hours.  Please don't hesitate to call and ask to speak to one of the nurses if you have concerns.  For further questions, please visit [email or web address]   

## 2015-11-16 NOTE — Clinical Social Work Placement (Signed)
Patient is set to discharge to [facility] today. Patient & son, Marya Amsler aware. Discharge packet given to RN, Robert Bellow. Patient's son to transport to SNF.     Raynaldo Opitz, [facility] Clinical Social Worker cell #: [phone]    CLINICAL SOCIAL WORK PLACEMENT  NOTE  Date:  11/16/2015  Patient Details  Name: [name] MRN: [id number] Date of Birth: 08-15-1921  Clinical Social Work is seeking post-discharge placement for this patient at the [place] level of care (*CSW will initial, date and re-position this form in  chart as items are completed):  Yes   Patient/family provided with [place] Work Department's list of facilities offering this level of care within the geographic area requested by the patient (or if unable, by the patient's family).  Yes   Patient/family informed of their freedom to choose among providers that offer the needed level of care, that participate in Medicare, Medicaid or managed care program needed by the patient, have an available bed and are willing to accept the patient.  Yes   Patient/family informed of [facility]'s ownership interest in [facility] and [facility], as well as of the fact that they are under no obligation to receive care at these facilities.  PASRR submitted to EDS on       PASRR number received on       Existing PASRR number confirmed on 11/15/15     FL2 transmitted to all facilities in geographic area requested by pt/family on 11/15/15     FL2 transmitted to all facilities within larger geographic area on       Patient informed that his/her managed care company has contracts with or will negotiate with certain facilities, including the following:        Yes   Patient/family informed of bed offers received.  Patient chooses bed [facility]     Physician recommends and patient chooses bed at      Patient to be transferred to [facility] on  11/16/15.  Patient to be transferred to facility by [facility]     Patient family notified on 11/16/15 of transfer.  Name of family member notified:  patient's son, Marya Amsler via phone     PHYSICIAN       Additional Comment:    _______________________________________________ Standley Brooking, LCSW 11/16/2015, 3:30 PM

## 2015-11-16 NOTE — Progress Notes (Deleted)
PT Cancellation Note  Patient Details Name: [name] MRN: [id number] DOB: 1921/07/02   Cancelled Treatment:     Dopplers on arrival will check back as schedule permits.    Rica Koyanagi  PTA [facility]  Acute  Rehab Pager      [phone]

## 2015-11-16 NOTE — Progress Notes (Signed)
No acute events. Patient is disoriented this morning. Denies pain. BM decreased to 1x last 24h. WBC stable/slightly decreased.   Vitals:   11/16/15 0515 11/16/15 0925  BP: (!) 132/59 134/62  Pulse: (!) 57   Resp: 18   Temp: 97.7 F (36.5 C)    CBC Latest Ref Rng & Units 11/16/2015 11/15/2015 11/14/2015  WBC 4.0 - 10.5 K/uL 11.3(H) 12.0(H) 11.1(H)  Hemoglobin 13.0 - 17.0 g/dL 11.0(L) 10.9(L) 11.5(L)  Hematocrit 39.0 - 52.0 % 33.6(L) 32.8(L) 34.4(L)  Platelets 150 - 400 K/uL 382 405(H) 405(H)   BMP Latest Ref Rng & Units 11/16/2015 11/15/2015 11/14/2015  Glucose 65 - 99 mg/dL 100(H) 121(H) 95  BUN 6 - 20 mg/dL 10 12 14   Creatinine 0.61 - 1.24 mg/dL 1.06 1.17 0.91  Sodium 135 - 145 mmol/L 142 142 143  Potassium 3.5 - 5.1 mmol/L 3.4(L) 3.6 3.2(L)  Chloride 101 - 111 mmol/L 105 109 111  CO2 22 - 32 mmol/L 32 28 27  Calcium 8.9 - 10.3 mg/dL 7.9(L) 7.8(L) 7.6(L)    In: 420 (120 Po) Out: 750 uop; BM x 1  Awake, interactive but not oriented to place, time or situation. No acute distress Unlabored respiration. Abdomen soft, nontender, minimally distended. Incision c/d/i without erythema, induration or fluctuance.   A/P: POD 7 s/p ex lap and enterotomy for bezoar extraction in medically complex patient c/b A-fib, acutely worsened CHF, malnutrition, acute delirium.  -Continue diet as tolerated -With WBC stable and benign exam, no CT indicated today. ABX for suspected aspiration PNA.

## 2015-11-16 NOTE — Discharge Summary (Addendum)
Physician Discharge Summary  [name] [id number] DOB: 12-29-1921 DOA: 11/03/2015  PCP: Jenny Reichmann, MD  Admit date: 11/03/2015 Discharge date: 11/16/2015  Admitted From: home Disposition:  SNF([facility]) Recommendations for Outpatient Follow-up:  1. Follow up with Dr. Hassell Done [facility] surgery) in 1-2 weeks 2. check BMP in one week (follow potassium) 3. Patient is being started on Eliquis for new onset A. fib with RVR.  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   Compression fracture of L2 lumbar vertebra (HCC)   Small bowel obstruction from bezoar s/p ex lap & evacuation 11/09/2015   Intussusception ([place])   Acute hypoxic respiratory failure ([place])   Cardiomyopathy ([place])   Hypokalemia   Hard of hearing  Brief narrative/history of present illness Please refer to admission H&P for details, in brief, 82 year old male with cardiomyopathy, lung lobectomy in the 80s, Barrett's esophagus, vertebral compression fracture who is pretty independent at baseline developed a sudden onset abdominal pain with vomiting while at a fast food joint. No fevers or chills. CT scan done in the ED showed intussusception of ileum. He was also found to be in hypoxic respiratory failure with concern for aspiration after vomiting. While in the hospital he developed a new onset A. fib with RVR which was managed with IV Cardizem and IV Lopressor. A 2-D echo done showed drop in EF of 25% (from 45% previously). The small  bowel obstruction was found to be due to bezoar of terminal ileum with near total obstruction and underwent enterotomy with removal.  stable postoperatively.  Hospital course Small bowel obstruction Secondary to bezoar of terminal ileum with near total obstruction and proximal bowel distention with early near perforation of multiple jejunal diverticula. Had some loose stools past 24 hrs. Stool for cdiff negative. Stable for d/c to SNF and will follow up with surgery in 1-2 weeks. Prescribed ultracet prn for pain.  Acute hypoxic resp failure - ? Aspiration pneumonia- started on Unasyn in ER and eventually was weaned to room air-  repeat CXR showed bibasilar opacities with hypo expended lungs. Now completed 10 days fo unasyn. Remains afebrile.    Acute delirium - required PRN Haldol and sitter. posibly post op , sundowning and due to infection. Now resolved.    Diarrhea: resolved.   New onset A-fib with RVR possibly triggered by acute insult. Rate controlled with BB. recuced EF of 25%.CHADS2vasc of 4. Cardiology recommended anticoagulation post op. Discussed options with patient's sone gary who agrees with starting NOAC. Will start on eliquis.   Systolic CHF - chronic - previously  EF 45 %, with grade 1 d CHF now worsened. - ECHO shows EF of 25-30% with mild to mod right heart failure as well- mild Pulm HTN Continue BB, lisinopril, lasix .  Acute kidney injury Due to dehydration , now resolved.  Hypokalemia Replenished. Follow at SNF   Hypernatremia -? Hypovolemic. resolved with change in IV fluids  HTN - started on oral lasix, lisinopril.     Very hard of hearing - has appt for  hearing aid on 8/25  Protein calorie malnutrition Added supplement   Consult:  [facility] cardiolgoy  Procedure  CT abd and pelvis  2d ECHO  ex lap and enterotomy for bezoar extraction      FAMILY COMUNICATION Discussed with son Dominica Severin on the phone   Discharge  Instructions     Medication List    STOP taking these medications   baclofen 10 MG tablet Commonly known as:  LIORESAL   lisinopril-hydrochlorothiazide 10-12.5 MG tablet Commonly known as:  PRINZIDE,ZESTORETIC     TAKE these medications   acidophilus Caps capsule Take 1 capsule by mouth daily.   apixaban 5 MG Tabs tablet Commonly known as:  ELIQUIS Take 1 tablet (5 mg total) by mouth 2 (two) times daily.   ENSURE PLUS Liqd Take 237 mLs by mouth daily.   esomeprazole 40 MG capsule Commonly known as:  NEXIUM Take 40 mg by mouth daily before breakfast.   furosemide 20 MG tablet Commonly known as:  LASIX Take 20 mg by mouth every other day.   furosemide 40 MG tablet Commonly known as:  LASIX Take 40 mg by mouth every other day.   lisinopril 5 MG tablet Commonly known as:  PRINIVIL,ZESTRIL Take 1 tablet (5 mg total) by mouth daily.   metoprolol succinate 25 MG 24 hr tablet Commonly known as:  TOPROL-XL Take 25 mg by mouth daily.   PRESERVISION AREDS 2 Caps Take 1 capsule by mouth 2 (two) times daily.   RESOURCE THICKENUP CLEAR Powd Take 120 g by mouth as needed.   simvastatin 20 MG tablet Commonly known as:  ZOCOR Take 20 mg by mouth at bedtime.   tamsulosin 0.4 MG Caps capsule Commonly known as:  FLOMAX Take 0.4 mg by mouth daily after supper.   traMADol-acetaminophen 37.5-325 MG tablet Commonly known as:  ULTRACET Take 1 tablet by mouth every 6 (six) hours as needed.      Follow-up Information    [facility] Follow up on 11/21/2015.   Specialty:  General Surgery Why:  Your appointment for staple removal is at 2:00 PM, be there 30 minute for check in. Contact information: [address] [place] [address] [phone]        Pedro Earls, MD Follow up on 12/01/2015.   Specialty:  General Surgery Why:  your appointment is at [place], be there 30 minutes early for check in. Contact information: [address]  [place] [address] [phone]        MD at SNF in 1 week .          Allergies  Allergen Reactions  . Ciprofloxacin Other (See Comments)    Reaction:  Unknown   . Penicillins Rash and Other (See Comments)    Tolerated Unasyn 11/03/15 Has patient had a PCN reaction causing immediate rash, facial/tongue/throat swelling, SOB or lightheadedness with hypotension: No Has patient had a PCN reaction causing severe rash involving mucus membranes or skin necrosis: No Has patient had a PCN reaction that required hospitalization No Has patient had a PCN reaction occurring within the last 10 years: No If all of the above answers are "NO", then may proceed with Cephalosporin use.       Procedures/Studies: Ct Abdomen Pelvis Wo Contrast  Result Date: 11/06/2015 CLINICAL DATA:  Intussusception with SBO KUB unremarkable, but distended. No flatus or BM. Continue NGT Still confused, in gloves. No abdominal pain. Difficult to assess. Now on 3rd day of NGT.  EXAM: CT ABDOMEN AND PELVIS WITHOUT CONTRAST TECHNIQUE: Multidetector CT imaging of the abdomen and pelvis was performed following the standard protocol without IV contrast. COMPARISON:  11/03/2015 FINDINGS: Lung bases: There is dependent lung base opacity associated with densely calcified pleural plaques and minimal pleural effusions. Lung base opacity most consistent with atelectasis. Heart is mildly enlarged. No convincing pulmonary edema. Hepatobiliary: Liver is unremarkable. Gallbladder surgically absent. No bile duct dilation. Spleen: 7 mm low-density lesion posteriorly. This is consistent with a cyst and is stable. There calcified splenic granuloma, also stable. Spleen normal in size. Pancreas: Unremarkable. Adrenal glands:  No masses. Kidneys, ureters, bladder: Bilateral renal cortical thinning. Small hyper attenuating lesion from the lateral lower pole the left kidney measuring 7 mm, likely a cyst complicated by previous hemorrhage or  proteinaceous contents. It is stable. No other renal masses, no stones and no hydronephrosis. Normal ureters. Bladder is unremarkable. Vascular: Diffuse ectasia of the abdominal aorta with atherosclerotic calcifications. Infrarenal aorta measures 3 cm greatest diameter. Loss Lymph nodes:  No adenopathy. Ascites: Small amount ascites noted adjacent to liver, along the pericolic gutters and in the posterior pelvic recess. Gastrointestinal: There are 2 contiguous densities in the distal ileum, 1 measuring 19 mm and the other 2.3 cm in greatest dimension. The current exam, this does not appear to be an intussusception but dense material within the small bowel. Bowel distal this is decompressed while bowel proximal to this is mildly distended. The colon is mostly decompressed. There is no bowel wall thickening. Normal appendix is visualized. Musculoskeletal: Well-positioned left hip total arthroplasty. Bones diffusely demineralized. There is a marked compression fracture of L2, which appears chronic. Mild compression fractures of T12 and T8. Bones are diffusely demineralized. IMPRESSION: 1. Partial small bowel obstruction similar to the prior CT. What was felt be an ileal intussusception on the prior study now appears more consistent with 2 adjacent densities, consistent with stones or dense stool, in the distal ileum. These may or may not be the cause of the obstruction. Bowel distal to these densities is decompressed. There is currently no convincing intussusception. 2. Small amount ascites, increased when compared the prior CT. 3. Minimal pleural effusions and dependent lung base atelectasis, increased from the prior study. Densely calcified pleural plaque at the lung bases is stable. 4. Aortic atherosclerosis and aortic ectasia, of the infrarenal abdominal aorta measuring 3 cm. Recommend followup by ultrasound in 3 years. This recommendation follows ACR consensus guidelines: White Paper of the ACR Incidental Findings  Committee II on Vascular Findings. [employer] Coll Radiol 2013; 10:[id number] Electronically Signed   By: Lajean Manes M.D.   On: 11/06/2015 11:25   Dg Abd 1 View  Result Date: 11/06/2015 CLINICAL DATA:  Followup for intussusception. EXAM: ABDOMEN - 1 VIEW COMPARISON:  11/05/2015 FINDINGS: Nasogastric tube passes below the diaphragm into the proximal stomach. There is mild small bowel dilation central abdomen. Air is seen within a normal caliber colon. There is some rectal air. Small bowel distention appears increased when compared to the previous day's study. IMPRESSION: 1. Mild small bowel dilation increased when compared to the previous day's study. 2. Nasogastric tube is well positioned. Electronically Signed   By: Lajean Manes M.D.   On: 11/06/2015 08:55   Dg Abd 1 View  Result Date: 11/05/2015 CLINICAL DATA:  Nasogastric tube placement.  Initial encounter. EXAM: ABDOMEN - 1 VIEW COMPARISON:  Abdominal radiograph performed 11/04/2015 FINDINGS: The patient's enteric tube is seen ending overlying the body of the stomach. The  visualized bowel gas pattern is grossly unremarkable. No free intra-abdominal air is seen, though evaluation for free air is limited on a single supine view. Scattered clips are seen at the right upper quadrant. Right basilar airspace opacity may reflect atelectasis or pneumonia. A small right pleural effusion is suspected. Prominent pleural calcification is noted at the medial right lung base. No acute osseous abnormalities are identified. Mild degenerative change is noted along the lower thoracic and lumbar spine. IMPRESSION: 1. Enteric tube seen ending overlying the body of the stomach. 2. Unremarkable bowel gas pattern; no free intra-abdominal air seen. 3. Right basilar airspace opacity may reflect atelectasis or pneumonia. Suspect small right pleural effusion. Electronically Signed   By: Garald Balding M.D.   On: 11/05/2015 06:47   Ct Abdomen Pelvis W Contrast  Result Date:  11/03/2015 CLINICAL DATA:  Intermittent left lower quadrant pain for several hours, initial encounter EXAM: CT ABDOMEN AND PELVIS WITH CONTRAST TECHNIQUE: Multidetector CT imaging of the abdomen and pelvis was performed using the standard protocol following bolus administration of intravenous contrast. CONTRAST:  36mL ISOVUE-300 IOPAMIDOL (ISOVUE-300) INJECTION 61% COMPARISON:  None. FINDINGS: Lower chest: Calcified pleural plaques are identified bilaterally. Mild bilateral lower lobe atelectatic changes are seen. No sizable effusion is noted. Hepatobiliary: Gallbladder is been surgically removed. The liver is within normal limits. Pancreas: There is 11 mm hypodensity noted in the head of the pancreas best seen on image number 31 of series 2. Although this may represent a pancreatic cyst the possibility of small pancreatic lesion could not be totally excluded. Spleen: Small splenic cyst is noted. Adrenals/Urinary Tract: No masses identified. No evidence of hydronephrosis. Stomach/Bowel: There is small bowel dilatation with some mild inflammatory changes surrounding the small bowel. This obstructive pattern continues into the right lower quadrant head which point there is an intussusception of the ileum within the ileum. No definitive mass lesion is seen. Small bowel beyond this intussusception is within normal limits. Scattered small bowel diverticula are noted. A large fluid containing diverticulum of the duodenum is seen adjacent to the pancreatic head. Moderate-sized hiatal hernia is noted. Vascular/Lymphatic: No pathologically enlarged lymph nodes. No evidence of abdominal aortic aneurysm. Reproductive: No mass or other significant abnormality. Other: Mild free fluid is noted within the pelvis. Musculoskeletal: Postsurgical changes in the left hip are noted. Chronic compression deformities at T12 and L2 are noted IMPRESSION: Small-bowel obstruction secondary to an ileal to ileal intussusception in the right  lower quadrant. Some inflammatory changes are noted surrounding the dilated loops in the left mid abdomen. No evidence of perforation is identified. Hypodensity within the head of the pancreas which may represent a complicated cyst although the possibility of a neoplasm could not be totally excluded. The need for further evaluation can be determined on a clinical basis. Bibasilar changes left greater than right likely representing some acute on chronic infiltrate/atelectasis. Critical Value/emergent results were called by telephone at the time of interpretation on 11/03/2015 at 3:54 pm to Dr. Jola Schmidt , who verbally acknowledged these results. Electronically Signed   By: Inez Catalina M.D.   On: 11/03/2015 15:56   Dg Chest Port 1 View  Result Date: 11/10/2015 CLINICAL DATA:  Hypoxia EXAM: PORTABLE CHEST 1 VIEW COMPARISON:  November 09, 2015 FINDINGS: Endotracheal tube tip is 2.1 cm above the carina. Nasogastric tube tip and side port are in the stomach. No pneumothorax. There is a stable nodular appearing opacity in the right lower lobe, likely a granuloma. There is patchy atelectasis in  both lower lobes. Foci of calcification are noted along each pleural surface, more on the right than on the left, findings consistent with either previous empyema or asbestos exposure. There is no new opacity. Heart is mildly enlarged with pulmonary vascularity within normal limits. There is atherosclerotic calcification in the aorta. No adenopathy evident. There is degenerative change in each shoulder. IMPRESSION: Tube positions as described without pneumothorax. Bibasilar atelectasis. Areas of pleural calcification bilaterally, more on the right than on the left, stable. Probable granuloma right lower lobe. Aortic atherosclerosis. Stable cardiac prominence. No frank edema or consolidation evident. Electronically Signed   By: Lowella Grip III M.D.   On: 11/10/2015 08:42   Dg Chest Port 1 View  Result Date:  11/09/2015 CLINICAL DATA:  Intubation. EXAM: PORTABLE CHEST 1 VIEW COMPARISON:  11/08/2015 FINDINGS: Endotracheal tube has its tip 1 cm above the carina. Nasogastric tube enters the stomach. The heart is enlarged. There is aortic atherosclerosis. There is infiltrate and/or volume loss in the lower lungs bilaterally. Some of the pulmonary opacities are somewhat nodular and should be followed on subsequent exams. No effusions. IMPRESSION: Endotracheal tube tip 1 cm above the carina. Nasogastric tube enters stomach. Infiltrate and/or volume loss at both lung bases. Follow-up nodular shadows after treatment. Cardiomegaly.  Aortic atherosclerosis. Electronically Signed   By: Nelson Chimes M.D.   On: 11/09/2015 18:21   Dg Chest Port 1 View  Result Date: 11/08/2015 CLINICAL DATA:  Hypertension.  Recent bowel intussusception EXAM: PORTABLE CHEST 1 VIEW COMPARISON:  November 07, 2015 FINDINGS: Nasogastric tube has been removed. There is patchy atelectasis in the right base region. Calcification on the right medially is likely due to prior emphysema, stable. No new opacity identified. Heart is upper normal in size with pulmonary vascularity within normal limits. There is atherosclerotic calcification aorta. Bones are osteoporotic. There is superior migration of each humeral head. IMPRESSION: Probable prior empyema on the right medially with calcification. Right base atelectasis. Lungs elsewhere clear. Stable cardiac silhouette. There is aortic atherosclerosis. There is evidence of bilateral chronic rotator cuff tears. Electronically Signed   By: Lowella Grip III M.D.   On: 11/08/2015 09:31   Dg Chest Port 1 View  Result Date: 11/07/2015 CLINICAL DATA:  Nasogastric tube. EXAM: PORTABLE CHEST 1 VIEW COMPARISON:  11/04/2015. FINDINGS: NG tube noted coiled stomach. Cardiomegaly with pulmonary vascular prominence interstitial prominence. No focal infiltrate. Mild bibasilar atelectasis. No pleural effusion or  pneumothorax. IMPRESSION: 1. NG tube noted coiled stomach. 2. Cardiomegaly with pulmonary interstitial prominence consistent with congestive heart failure and interstitial edema. Small left pleural effusion. 3. Low lung volumes with bibasilar atelectasis . Electronically Signed   By: Marcello Moores  Register   On: 11/07/2015 09:00   Dg Chest Port 1 View  Result Date: 11/04/2015 CLINICAL DATA:  Nasogastric tube placement.  Initial encounter. EXAM: PORTABLE CHEST 1 VIEW COMPARISON:  Chest radiograph performed 02/10/2014 FINDINGS: The patient's enteric tube is seen ending overlying the body of the stomach, with the side port about the gastroesophageal junction. This could be advanced several centimeters, as deemed clinically appropriate. The lungs are hypoexpanded. Bibasilar airspace opacities may reflect atelectasis or pneumonia. Small bilateral pleural effusions are suspected. No pneumothorax is seen. The cardiomediastinal silhouette is borderline normal in size. No acute osseous abnormalities are identified. Clips are noted within the right upper quadrant, reflecting prior cholecystectomy. IMPRESSION: 1. Enteric tube noted ending overlying the body of the stomach, with the side port about the gastroesophageal junction. This could be advanced several  centimeters, as deemed clinically appropriate. 2. Lungs hypoexpanded. Bibasilar airspace opacities may reflect atelectasis or pneumonia. Small bilateral pleural effusions suspected. Electronically Signed   By: Garald Balding M.D.   On: 11/04/2015 05:56   Dg Abd 2 Views  Result Date: 11/04/2015 CLINICAL DATA:  Check nasogastric catheter placement EXAM: ABDOMEN - 2 VIEW COMPARISON:  11/03/2015 FINDINGS: Scattered large and small bowel gas is noted. No obstructive changes seen. Bladder is well distended with contrast opacified urine. Left hip replacement is noted. A nasogastric catheter is noted within the stomach just beyond the gastroesophageal junction. No free air is  seen. Pleural plaques are noted bilaterally. IMPRESSION: Nasogastric catheter within the stomach. The degree of small bowel dilatation has improved from the prior CT examination. Electronically Signed   By: Inez Catalina M.D.   On: 11/04/2015 08:17   Dg Abd Portable 1v  Result Date: 11/09/2015 CLINICAL DATA:  Small bowel obstruction EXAM: PORTABLE ABDOMEN - 1 VIEW COMPARISON:  November 08, 2015 FINDINGS: Nasogastric tube tip and side port are in proximal stomach. There remain multiple loops of dilated small bowel in a pattern concerning for a degree of obstruction. No free air evident. There is evidence of prior calcified empyema is in the lung base regions. IMPRESSION: Bowel gas pattern remains of concern for a degree of bowel obstruction. No free air. Nasogastric tube tip and side port in proximal stomach. Electronically Signed   By: Lowella Grip III M.D.   On: 11/09/2015 07:01   Dg Abd Portable 1v  Result Date: 11/08/2015 CLINICAL DATA:  NG tube placement EXAM: PORTABLE ABDOMEN - 1 VIEW COMPARISON:  November 08, 2015 FINDINGS: The NG tube terminates in the distal esophagus. Recommend advancement. Persistent small bowel obstruction. Evaluation for free air is limited on this study but none is definitely seen. Pleural calcifications again identified. Minimal opacity in the lateral right lung is not completely excluded. No other acute abnormalities. IMPRESSION: 1. The NG tube side port is in the midesophagus and the distal tip is above the GE junction. Recommend advancement. 2. Persistent small bowel obstruction. 3. Calcified pleural plaques persists. These results will be called to the ordering clinician or representative by the Radiologist Assistant, and communication documented in the PACS or zVision Dashboard. Electronically Signed   By: Dorise Bullion III M.D   On: 11/08/2015 12:59   Dg Abd Portable 1v  Result Date: 11/08/2015 CLINICAL DATA:  Small-bowel obstruction EXAM: PORTABLE ABDOMEN - 1 VIEW  COMPARISON:  11/07/2015 FINDINGS: There is again noted small-bowel obstruction similar to that seen on the prior exam. Nasogastric catheter is not well appreciated on this exam. No definitive free air is seen. Left hip replacement and degenerative change of the lumbar spine are again noted. IMPRESSION: Persistent small bowel obstruction Electronically Signed   By: Inez Catalina M.D.   On: 11/08/2015 09:35   Dg Abd Portable 1v  Result Date: 11/07/2015 CLINICAL DATA:  Abdominal distention. EXAM: PORTABLE ABDOMEN - 1 VIEW COMPARISON:  CT 11/06/2015.  Abdominal series 813 2017, 11/05/2015 FINDINGS: NG tube noted coiled in the stomach. Progressive distention of multiple loops of small bowel are noted. Paucity of colonic gas noted. No pneumatosis noted. No free air identified. Severe degenerative changes and scoliosis lumbar spine. Degenerative changes right hip. Left hip prosthesis. Diffuse osteopenia. Surgical sutures left groin. IMPRESSION: 1. NG tube noted coiled stomach. 2. Progressive distention of multiple loops of small bowel. Small bowel loops are noted to be dilated up to 4.9 cm. Findings consistent with  small-bowel obstruction. Electronically Signed   By: Marcello Moores  Register   On: 11/07/2015 06:43   Dg Abd Portable 1v  Result Date: 11/03/2015 CLINICAL DATA:  Nasogastric tube placement. EXAM: PORTABLE ABDOMEN - 1 VIEW COMPARISON:  CT 11/03/2015 FINDINGS: Nasogastric tube has been placed, tip overlying the level of the proximal stomach and possibly within the hiatal hernia or proximal stomach. Bowel gas pattern shows persistent small bowel dilatation. Contrast is identified within the urinary bladder following CT exam. IMPRESSION: Nasogastric tube tip likely within the proximal stomach or hiatal hernia. Persistent dilatation of small bowel loops. Electronically Signed   By: Nolon Nations M.D.   On: 11/03/2015 17:03     2-D echo (11/07/2015) Study Conclusions  - Left ventricle: The cavity size was  normal. Wall thickness was   increased in a pattern of mild LVH. Indeterminant diastolic   function (atrial fibrillation). Systolic function was severely   reduced. The estimated ejection fraction was in the range of 25%   to 30%. Diffuse hypokinesis but lateral wall function looked   better than other walls. - Aortic valve: There was no stenosis. There was trivial   regurgitation. - Mitral valve: Mildly calcified annulus. There was no significant   regurgitation. - Left atrium: The atrium was mildly dilated. - Right ventricle: The cavity size was normal. Systolic function   was mildly to moderately reduced. - Tricuspid valve: Peak RV-RA gradient (S): 31 mm Hg. - Systemic veins: IVC was not visualized.  Impressions:  - The patient was in atrial fibrillation. Normal LV size with mild   LV hypertrophy. EF 25-30%. Lateral wall function looked   relatively preserved compared to other walls. Normal RV size with   mild to moderately decreased systolic function. Mild pulmonary   hypertension.   Subjective: Patient today denies any abdominal pain or difficulty breathing.  Discharge Exam: Vitals:   11/16/15 0515 11/16/15 0925  BP: (!) 132/59 134/62  Pulse: (!) 57   Resp: 18   Temp: 97.7 F (36.5 C)    Vitals:   11/15/15 0412 11/15/15 2124 11/16/15 0515 11/16/15 0925  BP: 127/70 127/62 (!) 132/59 134/62  Pulse: 74 63 (!) 57   Resp: 18 18 18    Temp: 97.8 F (36.6 C) 97.8 F (36.6 C) 97.7 F (36.5 C)   TempSrc: Oral Oral    SpO2: 93% 96% 98%   Weight: 69.2 kg (152 lb 8.9 oz)  65.2 kg (143 lb 11.8 oz)   Height:        General: Elderly male. Hard of hearing, not in distress HEENT: No pallor, moist mucosa, supple neck Cardiovascular: S1 and S2 irregular, no murmurs rub or gallop Respiratory: CTA bilaterally, no wheezing, no rhonchi Abdominal: Soft, nondistended, midline laparotomy scar with staples, bowel sounds present, nontender Extremities: Warm, no edema  CNS: Alert  and oriented    The results of significant diagnostics from this hospitalization (including imaging, microbiology, ancillary and laboratory) are listed below for reference.     Microbiology: Recent Results (from the past 240 hour(s))  C difficile quick scan w PCR reflex     Status: None   Collection Time: 11/15/15 11:32 PM  Result Value Ref Range Status   C Diff antigen NEGATIVE NEGATIVE Final   C Diff toxin NEGATIVE NEGATIVE Final   C Diff interpretation No C. difficile detected.  Final     Labs: BNP (last 3 results) No results for input(s): BNP in the last 8760 hours. Basic Metabolic Panel:  Recent Labs  Lab 11/10/15 0745 11/10/15 1352 11/11/15 0339 11/12/15 0800 11/13/15 0345 11/14/15 0404 11/15/15 0604 11/16/15 0758  NA 140  --  141 145 143 143 142 142  K 4.4 4.3 3.6 3.8 3.8 3.2* 3.6 3.4*  CL 117*  --  114* 114* 114* 111 109 105  CO2 18*  --  21* 22 21* 27 28 32  GLUCOSE 144*  --  98 71 92 95 121* 100*  BUN 22*  --  20 20 14 14 12 10   CREATININE 0.98  --  1.29* 1.07 0.98 0.91 1.17 1.06  CALCIUM 7.3*  --  7.3* 7.8* 7.8* 7.6* 7.8* 7.9*  MG 1.6* 2.1 1.9  --  1.8  --   --   --   PHOS 3.1  --  3.4  --   --   --   --   --    Liver Function Tests:  Recent Labs Lab 11/11/15 0339  AST 10*  ALT 12*  ALKPHOS 54  BILITOT 0.9  PROT 4.3*  ALBUMIN 1.7*   No results for input(s): LIPASE, AMYLASE in the last 168 hours. No results for input(s): AMMONIA in the last 168 hours. CBC:  Recent Labs Lab 11/11/15 0339 11/12/15 0800 11/13/15 0345 11/14/15 0838 11/15/15 0604 11/16/15 0758  WBC 12.9* 12.1* 12.1* 11.1* 12.0* 11.3*  NEUTROABS 10.0*  --   --   --   --   --   HGB 10.5* 11.1* 12.0* 11.5* 10.9* 11.0*  HCT 30.5* 33.3* 36.1* 34.4* 32.8* 33.6*  MCV 88.4 91.7 89.4 90.1 91.1 89.4  PLT 302 332 399 405* 405* 382   Cardiac Enzymes:  Recent Labs Lab 11/09/15 1803 11/10/15 0119 11/10/15 0425  TROPONINI 0.15* 0.10* 0.10*   BNP: Invalid input(s):  POCBNP CBG:  Recent Labs Lab 11/15/15 0801 11/15/15 1628 11/15/15 2227 11/16/15 0729 11/16/15 1150  GLUCAP 113* 104* 95 78 103*   D-Dimer No results for input(s): DDIMER in the last 72 hours. Hgb A1c No results for input(s): HGBA1C in the last 72 hours. Lipid Profile No results for input(s): CHOL, HDL, LDLCALC, TRIG, CHOLHDL, LDLDIRECT in the last 72 hours. Thyroid function studies No results for input(s): TSH, T4TOTAL, T3FREE, THYROIDAB in the last 72 hours.  Invalid input(s): FREET3 Anemia work up No results for input(s): VITAMINB12, FOLATE, FERRITIN, TIBC, IRON, RETICCTPCT in the last 72 hours. Urinalysis    Component Value Date/Time   COLORURINE YELLOW 11/03/2015 1420   APPEARANCEUR CLEAR 11/03/2015 1420   LABSPEC 1.038 (H) 11/03/2015 1420   PHURINE 6.0 11/03/2015 1420   GLUCOSEU NEGATIVE 11/03/2015 1420   HGBUR NEGATIVE 11/03/2015 1420   BILIRUBINUR NEGATIVE 11/03/2015 1420   BILIRUBINUR neg 08/18/2013 0848   KETONESUR NEGATIVE 11/03/2015 1420   PROTEINUR NEGATIVE 11/03/2015 1420   UROBILINOGEN 0.2 11/30/2013 0804   NITRITE NEGATIVE 11/03/2015 1420   LEUKOCYTESUR NEGATIVE 11/03/2015 1420   Sepsis Labs Invalid input(s): PROCALCITONIN,  WBC,  LACTICIDVEN Microbiology Recent Results (from the past 240 hour(s))  C difficile quick scan w PCR reflex     Status: None   Collection Time: 11/15/15 11:32 PM  Result Value Ref Range Status   C Diff antigen NEGATIVE NEGATIVE Final   C Diff toxin NEGATIVE NEGATIVE Final   C Diff interpretation No C. difficile detected.  Final     Time coordinating discharge: Over 30 minutes  SIGNED:   Louellen Molder, MD  [facility] 11/16/2015, 2:37 PM Pager   If 7PM-7AM, please contact night-coverage [email or web address] Password [email or web address]

## 2015-11-17 ENCOUNTER — Encounter: Payer: Self-pay | Admitting: Adult Health

## 2015-11-17 ENCOUNTER — Non-Acute Institutional Stay (SKILLED_NURSING_FACILITY): Payer: Medicare Other | Admitting: Adult Health

## 2015-11-17 DIAGNOSIS — K219 Gastro-esophageal reflux disease without esophagitis: Secondary | ICD-10-CM

## 2015-11-17 DIAGNOSIS — E43 Unspecified severe protein-calorie malnutrition: Secondary | ICD-10-CM | POA: Diagnosis not present

## 2015-11-17 DIAGNOSIS — T189XXS Foreign body of alimentary tract, part unspecified, sequela: Secondary | ICD-10-CM

## 2015-11-17 DIAGNOSIS — E876 Hypokalemia: Secondary | ICD-10-CM | POA: Diagnosis not present

## 2015-11-17 DIAGNOSIS — D72829 Elevated white blood cell count, unspecified: Secondary | ICD-10-CM

## 2015-11-17 DIAGNOSIS — D62 Acute posthemorrhagic anemia: Secondary | ICD-10-CM | POA: Diagnosis not present

## 2015-11-17 DIAGNOSIS — I10 Essential (primary) hypertension: Secondary | ICD-10-CM | POA: Diagnosis not present

## 2015-11-17 DIAGNOSIS — J189 Pneumonia, unspecified organism: Secondary | ICD-10-CM | POA: Diagnosis not present

## 2015-11-17 DIAGNOSIS — R5381 Other malaise: Secondary | ICD-10-CM

## 2015-11-17 DIAGNOSIS — I480 Paroxysmal atrial fibrillation: Secondary | ICD-10-CM

## 2015-11-17 DIAGNOSIS — I5022 Chronic systolic (congestive) heart failure: Secondary | ICD-10-CM | POA: Diagnosis not present

## 2015-11-17 DIAGNOSIS — H9190 Unspecified hearing loss, unspecified ear: Secondary | ICD-10-CM | POA: Diagnosis not present

## 2015-11-17 NOTE — Progress Notes (Signed)
Patient ID: [name], male   DOB: [birth year], 82 y.o.   MRN: [id number]    DATE:  11/17/2015   MRN:  [id number]  BIRTHDAY: October 28, 1921  Facility:  Nursing Home Location:  [place] Room Number: 903-A  LEVEL OF CARE:  SNF (31)  Contact Information    Name Relation Home Work [place] Son   [phone]   [place], [place]   [phone]       Code Status History    Date Active Date Inactive Code Status Order ID Comments User Context   11/09/2015  5:31 PM 11/16/2015  7:25 PM Full Code [id number]  [name], MD Inpatient   11/03/2015  5:39 PM 11/09/2015  5:31 PM Full Code [id number]  [name], MD ED   11/30/2013  6:50 PM 12/04/2013  7:21 PM Full Code [id number]  [name], MD Inpatient   11/30/2013 12:46 PM 11/30/2013  6:50 PM Full Code [id number]  [name], MD Inpatient   07/09/2013  1:13 AM 07/15/2013  5:00 PM Full Code [id number]  [name], MD Inpatient    Advance Directive Documentation   Flowsheet Row Most Recent Value  Type of Advance Directive  Out of facility DNR (pink MOST or yellow form)  Pre-existing out of facility DNR order (yellow form or pink MOST form)  No data  "MOST" Form in Place?  No data       Chief Complaint  Patient presents with  . Hospitalization Follow-up    HISTORY OF PRESENT ILLNESS:  This is a 83 year old male who has been admitted to [facility] on 11/16/15 from [facility]. He has PMH of cardiomyopathy, lung lobectomy in the 80s, barrette's esophagus and vertebral compression fracture. He developed a sudden onset of abdominal pain with vomiting. CT scan done in the ED showed intussusception of ileum. While in the hospital, he developed a new onset of A. fib with RVR which was managed with IV Cardizem and IV Lopressor. 2-D echo showed EF of 25% which dropped from 45% previously. The small bowel obstruction was found to be due to be bezoar of terminal ileum with near total obstruction and  underwent enterotomy with removal on 11/09/15. He had acute hypoxic respiratory failure and chest x-ray showed by basilar opacities with hypoexpanded lungs. He was treated with Unasyn 10 days.  He has been admitted for a short-term rehabilitation.  PAST MEDICAL HISTORY:  Past Medical History:  Diagnosis Date  . Aspiration pneumonia ([place])   . Atrial fibrillation with RVR ([place])   . BARRETTS ESOPHAGUS 11/19/2007  . Blood transfusion without reported diagnosis   . Cardiomyopathy ([place])   . CHANGE IN BOWELS 02/01/2009  . Chronic combined systolic (congestive) and diastolic (congestive) heart failure ([place])    a. echo 2015: EF 45% b. Echo 10/2015: EF 25-30% with diffuse HK  . CKD stage G2/A2, GFR 60 - 89 and albumin creatinine ratio 30 - 299 mg/g   . Compression fracture of L2 lumbar vertebra ([facility])   . Diarrhea 01/31/2009  . DIVERTICULOSIS, COLON 11/19/2007  . 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 GASTRITIS 11/19/2007  . GERD 11/19/2007  . HIATAL HERNIA 11/19/2007  . HOH (hard of hearing)   . HTN (hypertension)   . HYPERLIPIDEMIA 06/27/2009  . Hypokalemia   . HYPOTHYROIDISM 06/27/2009  . IBS (irritable bowel syndrome)   . Intussusception ([place])   . MELENA 08/01/2009  . Nonischemic cardiomyopathy ([place])    a. NST in  2010 with no evidence of ischemia.  Marland Kitchen PERSONAL HX COLONIC POLYPS 02/01/2009  . Protein-calorie malnutrition, severe ([place])   . TINEA CORPORIS 06/27/2009  . TOBACCO USE, QUIT 06/27/2009  . TREMOR, ESSENTIAL 06/27/2009     CURRENT MEDICATIONS: Reviewed  Patient's Medications  New Prescriptions   No medications on file  Previous Medications   ACIDOPHILUS (RISAQUAD) CAPS CAPSULE    Take 1 capsule by mouth daily.    APIXABAN (ELIQUIS) 5 MG TABS TABLET    Take 1 tablet (5 mg total) by mouth 2 (two) times daily.   ESOMEPRAZOLE (NEXIUM) 40 MG CAPSULE    Take 40 mg by mouth daily before breakfast.   FUROSEMIDE (LASIX) 20 MG TABLET    Take 20 mg by mouth every other day.   FUROSEMIDE (LASIX) 40 MG TABLET     Take 40 mg by mouth every other day.   LISINOPRIL (PRINIVIL,ZESTRIL) 5 MG TABLET    Take 1 tablet (5 mg total) by mouth daily.   METOPROLOL SUCCINATE (TOPROL-XL) 25 MG 24 HR TABLET    Take 25 mg by mouth daily.    MULTIPLE VITAMINS-MINERALS (PRESERVISION AREDS 2) CAPS    Take 1 capsule by mouth 2 (two) times daily.    SIMVASTATIN (ZOCOR) 20 MG TABLET    Take 20 mg by mouth at bedtime.    TAMSULOSIN (FLOMAX) 0.4 MG CAPS CAPSULE    Take 0.4 mg by mouth daily after supper.    TRAMADOL-ACETAMINOPHEN (ULTRACET) 37.5-325 MG TABLET    Take 1 tablet by mouth every 6 (six) hours as needed.   UNABLE TO FIND    Med Name: MedPass 120 mL po qd for nutritional support  Modified Medications   No medications on file  Discontinued Medications   ENSURE PLUS (ENSURE PLUS) LIQD    Take 237 mLs by mouth daily.   MALTODEXTRIN-XANTHAN GUM (RESOURCE THICKENUP CLEAR) POWD    Take 120 g by mouth as needed.     Allergies  Allergen Reactions  . Ciprofloxacin Other (See Comments)    Reaction:  Unknown   . Penicillins Rash and Other (See Comments)    Tolerated Unasyn 11/03/15 Has patient had a PCN reaction causing immediate rash, facial/tongue/throat swelling, SOB or lightheadedness with hypotension: No Has patient had a PCN reaction causing severe rash involving mucus membranes or skin necrosis: No Has patient had a PCN reaction that required hospitalization No Has patient had a PCN reaction occurring within the last 10 years: No If all of the above answers are "NO", then may proceed with Cephalosporin use.     REVIEW OF SYSTEMS:  GENERAL: no change in appetite, no fatigue, no weight changes, no fever, chills or weakness EYES: Denies change in vision, dry eyes, eye pain, itching or discharge EARS: Denies change in hearing, ringing in ears, or earache NOSE: Denies nasal congestion or epistaxis MOUTH and THROAT: Denies oral discomfort, gingival pain or bleeding, pain from teeth or hoarseness   RESPIRATORY: no  cough, SOB, DOE, wheezing, hemoptysis CARDIAC: no chest pain, edema or palpitations GI: no abdominal pain, diarrhea, constipation, heart burn, nausea or vomiting GU: Denies dysuria, frequency, hematuria, incontinence, or discharge PSYCHIATRIC: Denies feeling of depression or anxiety. No report of hallucinations, insomnia, paranoia, or agitation    PHYSICAL EXAMINATION  GENERAL APPEARANCE: Well nourished. In no acute distress. Normal body habitus SKIN:  Midline abdominal incision has 15 staples,dry, no erythema HEAD: Normal in size and contour. No evidence of trauma EYES: Lids open and close normally.  No blepharitis, entropion or ectropion. PERRL. Conjunctivae are clear and sclerae are white. Lenses are without opacity EARS: Pinnae are normal.Hard of hearing. MOUTH and THROAT: Lips are without lesions. Oral mucosa is moist and without lesions. Tongue is normal in shape, size, and color and without lesions NECK: supple, trachea midline, no neck masses, no thyroid tenderness, no thyromegaly LYMPHATICS: no LAN in the neck, no supraclavicular LAN RESPIRATORY: breathing is even & unlabored, BS CTAB CARDIAC: RRR, no murmur,no extra heart sounds, no edema GI: abdomen soft, normal BS, no masses, no tenderness, no hepatomegaly, no splenomegaly EXTREMITIES:  Able to move 4 extremities PSYCHIATRIC: Alert to place and person, disoriented to time.  Affect and behavior are appropriate  LABS/RADIOLOGY: Labs reviewed: Basic Metabolic Panel:  Recent Labs  11/10/15 0745 11/10/15 1352 11/11/15 0339  11/13/15 0345 11/14/15 0404 11/15/15 0604 11/16/15 0758  NA 140  --  141  < > 143 143 142 142  K 4.4 4.3 3.6  < > 3.8 3.2* 3.6 3.4*  CL 117*  --  114*  < > 114* 111 109 105  CO2 18*  --  21*  < > 21* 27 28 32  GLUCOSE 144*  --  98  < > 92 95 121* 100*  BUN 22*  --  20  < > 14 14 12 10   CREATININE 0.98  --  1.29*  < > 0.98 0.91 1.17 1.06  CALCIUM 7.3*  --  7.3*  < > 7.8* 7.6* 7.8* 7.9*  MG 1.6*  2.1 1.9  --  1.8  --   --   --   PHOS 3.1  --  3.4  --   --   --   --   --   < > = values in this interval not displayed. Liver Function Tests:  Recent Labs  03/16/15 0936 11/03/15 1430 11/11/15 0339  AST 11 66* 10*  ALT 9 42 12*  ALKPHOS 180* 137* 54  BILITOT 0.8 1.2 0.9  PROT 6.7 6.1* 4.3*  ALBUMIN 3.8 3.2* 1.7*    Recent Labs  11/03/15 1430  LIPASE 30   CBC:  Recent Labs  11/05/15 0418 11/06/15 0323  11/11/15 0339  11/14/15 0838 11/15/15 0604 11/16/15 0758  WBC 13.7* 16.1*  < > 12.9*  < > 11.1* 12.0* 11.3*  NEUTROABS 11.8* 14.4*  --  10.0*  --   --   --   --   HGB 11.7* 12.3*  < > 10.5*  < > 11.5* 10.9* 11.0*  HCT 34.6* 37.0*  < > 30.5*  < > 34.4* 32.8* 33.6*  MCV 90.1 91.4  < > 88.4  < > 90.1 91.1 89.4  PLT 205 229  < > 302  < > 405* 405* 382  < > = values in this interval not displayed.  Lipid Panel:  Recent Labs  12/31/14 0952 06/30/15 0949  HDL 29* 42   Cardiac Enzymes:  Recent Labs  11/09/15 1803 11/10/15 0119 11/10/15 0425  TROPONINI 0.15* 0.10* 0.10*   CBG:  Recent Labs  11/15/15 2227 11/16/15 0729 11/16/15 1150  GLUCAP 95 78 103*      Ct Abdomen Pelvis Wo Contrast  Result Date: 11/06/2015 CLINICAL DATA:  Intussusception with SBO KUB unremarkable, but distended. No flatus or BM. Continue NGT Still confused, in gloves. No abdominal pain. Difficult to assess. Now on 3rd day of NGT. EXAM: CT ABDOMEN AND PELVIS WITHOUT CONTRAST TECHNIQUE: Multidetector CT imaging of the abdomen and pelvis was performed following  the standard protocol without IV contrast. COMPARISON:  11/03/2015 FINDINGS: Lung bases: There is dependent lung base opacity associated with densely calcified pleural plaques and minimal pleural effusions. Lung base opacity most consistent with atelectasis. Heart is mildly enlarged. No convincing pulmonary edema. Hepatobiliary: Liver is unremarkable. Gallbladder surgically absent. No bile duct dilation. Spleen: 7 mm low-density  lesion posteriorly. This is consistent with a cyst and is stable. There calcified splenic granuloma, also stable. Spleen normal in size. Pancreas: Unremarkable. Adrenal glands:  No masses. Kidneys, ureters, bladder: Bilateral renal cortical thinning. Small hyper attenuating lesion from the lateral lower pole the left kidney measuring 7 mm, likely a cyst complicated by previous hemorrhage or proteinaceous contents. It is stable. No other renal masses, no stones and no hydronephrosis. Normal ureters. Bladder is unremarkable. Vascular: Diffuse ectasia of the abdominal aorta with atherosclerotic calcifications. Infrarenal aorta measures 3 cm greatest diameter. Loss Lymph nodes:  No adenopathy. Ascites: Small amount ascites noted adjacent to liver, along the pericolic gutters and in the posterior pelvic recess. Gastrointestinal: There are 2 contiguous densities in the distal ileum, 1 measuring 19 mm and the other 2.3 cm in greatest dimension. The current exam, this does not appear to be an intussusception but dense material within the small bowel. Bowel distal this is decompressed while bowel proximal to this is mildly distended. The colon is mostly decompressed. There is no bowel wall thickening. Normal appendix is visualized. Musculoskeletal: Well-positioned left hip total arthroplasty. Bones diffusely demineralized. There is a marked compression fracture of L2, which appears chronic. Mild compression fractures of T12 and T8. Bones are diffusely demineralized. IMPRESSION: 1. Partial small bowel obstruction similar to the prior CT. What was felt be an ileal intussusception on the prior study now appears more consistent with 2 adjacent densities, consistent with stones or dense stool, in the distal ileum. These may or may not be the cause of the obstruction. Bowel distal to these densities is decompressed. There is currently no convincing intussusception. 2. Small amount ascites, increased when compared the prior CT. 3.  Minimal pleural effusions and dependent lung base atelectasis, increased from the prior study. Densely calcified pleural plaque at the lung bases is stable. 4. Aortic atherosclerosis and aortic ectasia, of the infrarenal abdominal aorta measuring 3 cm. Recommend followup by ultrasound in 3 years. This recommendation follows ACR consensus guidelines: White Paper of the ACR Incidental Findings Committee II on Vascular Findings. Natasha [facility] Coll Radiol 2013; 10:789-794 Electronically Signed   By: Lajean Manes M.D.   On: 11/06/2015 11:25   Dg Abd 1 View  Result Date: 11/06/2015 CLINICAL DATA:  Followup for intussusception. EXAM: ABDOMEN - 1 VIEW COMPARISON:  11/05/2015 FINDINGS: Nasogastric tube passes below the diaphragm into the proximal stomach. There is mild small bowel dilation central abdomen. Air is seen within a normal caliber colon. There is some rectal air. Small bowel distention appears increased when compared to the previous day's study. IMPRESSION: 1. Mild small bowel dilation increased when compared to the previous day's study. 2. Nasogastric tube is well positioned. Electronically Signed   By: Lajean Manes M.D.   On: 11/06/2015 08:55   Dg Abd 1 View  Result Date: 11/05/2015 CLINICAL DATA:  Nasogastric tube placement.  Initial encounter. EXAM: ABDOMEN - 1 VIEW COMPARISON:  Abdominal radiograph performed 11/04/2015 FINDINGS: The patient's enteric tube is seen ending overlying the body of the stomach. The visualized bowel gas pattern is grossly unremarkable. No free intra-abdominal air is seen, though evaluation for free air is  limited on a single supine view. Scattered clips are seen at the right upper quadrant. Right basilar airspace opacity may reflect atelectasis or pneumonia. A small right pleural effusion is suspected. Prominent pleural calcification is noted at the medial right lung base. No acute osseous abnormalities are identified. Mild degenerative change is noted along the lower thoracic and  lumbar spine. IMPRESSION: 1. Enteric tube seen ending overlying the body of the stomach. 2. Unremarkable bowel gas pattern; no free intra-abdominal air seen. 3. Right basilar airspace opacity may reflect atelectasis or pneumonia. Suspect small right pleural effusion. Electronically Signed   By: Garald Balding M.D.   On: 11/05/2015 06:47   Ct Abdomen Pelvis W Contrast  Result Date: 11/03/2015 CLINICAL DATA:  Intermittent left lower quadrant pain for several hours, initial encounter EXAM: CT ABDOMEN AND PELVIS WITH CONTRAST TECHNIQUE: Multidetector CT imaging of the abdomen and pelvis was performed using the standard protocol following bolus administration of intravenous contrast. CONTRAST:  77mL ISOVUE-300 IOPAMIDOL (ISOVUE-300) INJECTION 61% COMPARISON:  None. FINDINGS: Lower chest: Calcified pleural plaques are identified bilaterally. Mild bilateral lower lobe atelectatic changes are seen. No sizable effusion is noted. Hepatobiliary: Gallbladder is been surgically removed. The liver is within normal limits. Pancreas: There is 11 mm hypodensity noted in the head of the pancreas best seen on image number 31 of series 2. Although this may represent a pancreatic cyst the possibility of small pancreatic lesion could not be totally excluded. Spleen: Small splenic cyst is noted. Adrenals/Urinary Tract: No masses identified. No evidence of hydronephrosis. Stomach/Bowel: There is small bowel dilatation with some mild inflammatory changes surrounding the small bowel. This obstructive pattern continues into the right lower quadrant head which point there is an intussusception of the ileum within the ileum. No definitive mass lesion is seen. Small bowel beyond this intussusception is within normal limits. Scattered small bowel diverticula are noted. A large fluid containing diverticulum of the duodenum is seen adjacent to the pancreatic head. Moderate-sized hiatal hernia is noted. Vascular/Lymphatic: No pathologically  enlarged lymph nodes. No evidence of abdominal aortic aneurysm. Reproductive: No mass or other significant abnormality. Other: Mild free fluid is noted within the pelvis. Musculoskeletal: Postsurgical changes in the left hip are noted. Chronic compression deformities at T12 and L2 are noted IMPRESSION: Small-bowel obstruction secondary to an ileal to ileal intussusception in the right lower quadrant. Some inflammatory changes are noted surrounding the dilated loops in the left mid abdomen. No evidence of perforation is identified. Hypodensity within the head of the pancreas which may represent a complicated cyst although the possibility of a neoplasm could not be totally excluded. The need for further evaluation can be determined on a clinical basis. Bibasilar changes left greater than right likely representing some acute on chronic infiltrate/atelectasis. Critical Value/emergent results were called by telephone at the time of interpretation on 11/03/2015 at 3:54 pm to Dr. Jola Schmidt , who verbally acknowledged these results. Electronically Signed   By: Inez Catalina M.D.   On: 11/03/2015 15:56   Dg Chest Port 1 View  Result Date: 11/10/2015 CLINICAL DATA:  Hypoxia EXAM: PORTABLE CHEST 1 VIEW COMPARISON:  November 09, 2015 FINDINGS: Endotracheal tube tip is 2.1 cm above the carina. Nasogastric tube tip and side port are in the stomach. No pneumothorax. There is a stable nodular appearing opacity in the right lower lobe, likely a granuloma. There is patchy atelectasis in both lower lobes. Foci of calcification are noted along each pleural surface, more on the right than on the  left, findings consistent with either previous empyema or asbestos exposure. There is no new opacity. Heart is mildly enlarged with pulmonary vascularity within normal limits. There is atherosclerotic calcification in the aorta. No adenopathy evident. There is degenerative change in each shoulder. IMPRESSION: Tube positions as described  without pneumothorax. Bibasilar atelectasis. Areas of pleural calcification bilaterally, more on the right than on the left, stable. Probable granuloma right lower lobe. Aortic atherosclerosis. Stable cardiac prominence. No frank edema or consolidation evident. Electronically Signed   By: Lowella Grip III M.D.   On: 11/10/2015 08:42   Dg Chest Port 1 View  Result Date: 11/09/2015 CLINICAL DATA:  Intubation. EXAM: PORTABLE CHEST 1 VIEW COMPARISON:  11/08/2015 FINDINGS: Endotracheal tube has its tip 1 cm above the carina. Nasogastric tube enters the stomach. The heart is enlarged. There is aortic atherosclerosis. There is infiltrate and/or volume loss in the lower lungs bilaterally. Some of the pulmonary opacities are somewhat nodular and should be followed on subsequent exams. No effusions. IMPRESSION: Endotracheal tube tip 1 cm above the carina. Nasogastric tube enters stomach. Infiltrate and/or volume loss at both lung bases. Follow-up nodular shadows after treatment. Cardiomegaly.  Aortic atherosclerosis. Electronically Signed   By: Nelson Chimes M.D.   On: 11/09/2015 18:21   Dg Chest Port 1 View  Result Date: 11/08/2015 CLINICAL DATA:  Hypertension.  Recent bowel intussusception EXAM: PORTABLE CHEST 1 VIEW COMPARISON:  November 07, 2015 FINDINGS: Nasogastric tube has been removed. There is patchy atelectasis in the right base region. Calcification on the right medially is likely due to prior emphysema, stable. No new opacity identified. Heart is upper normal in size with pulmonary vascularity within normal limits. There is atherosclerotic calcification aorta. Bones are osteoporotic. There is superior migration of each humeral head. IMPRESSION: Probable prior empyema on the right medially with calcification. Right base atelectasis. Lungs elsewhere clear. Stable cardiac silhouette. There is aortic atherosclerosis. There is evidence of bilateral chronic rotator cuff tears. Electronically Signed   By:  Lowella Grip III M.D.   On: 11/08/2015 09:31   Dg Chest Port 1 View  Result Date: 11/07/2015 CLINICAL DATA:  Nasogastric tube. EXAM: PORTABLE CHEST 1 VIEW COMPARISON:  11/04/2015. FINDINGS: NG tube noted coiled stomach. Cardiomegaly with pulmonary vascular prominence interstitial prominence. No focal infiltrate. Mild bibasilar atelectasis. No pleural effusion or pneumothorax. IMPRESSION: 1. NG tube noted coiled stomach. 2. Cardiomegaly with pulmonary interstitial prominence consistent with congestive heart failure and interstitial edema. Small left pleural effusion. 3. Low lung volumes with bibasilar atelectasis . Electronically Signed   By: Marcello Moores  Register   On: 11/07/2015 09:00   Dg Chest Port 1 View  Result Date: 11/04/2015 CLINICAL DATA:  Nasogastric tube placement.  Initial encounter. EXAM: PORTABLE CHEST 1 VIEW COMPARISON:  Chest radiograph performed 02/10/2014 FINDINGS: The patient's enteric tube is seen ending overlying the body of the stomach, with the side port about the gastroesophageal junction. This could be advanced several centimeters, as deemed clinically appropriate. The lungs are hypoexpanded. Bibasilar airspace opacities may reflect atelectasis or pneumonia. Small bilateral pleural effusions are suspected. No pneumothorax is seen. The cardiomediastinal silhouette is borderline normal in size. No acute osseous abnormalities are identified. Clips are noted within the right upper quadrant, reflecting prior cholecystectomy. IMPRESSION: 1. Enteric tube noted ending overlying the body of the stomach, with the side port about the gastroesophageal junction. This could be advanced several centimeters, as deemed clinically appropriate. 2. Lungs hypoexpanded. Bibasilar airspace opacities may reflect atelectasis or pneumonia. Small bilateral pleural  effusions suspected. Electronically Signed   By: Garald Balding M.D.   On: 11/04/2015 05:56   Dg Abd 2 Views  Result Date: 11/04/2015 CLINICAL  DATA:  Check nasogastric catheter placement EXAM: ABDOMEN - 2 VIEW COMPARISON:  11/03/2015 FINDINGS: Scattered large and small bowel gas is noted. No obstructive changes seen. Bladder is well distended with contrast opacified urine. Left hip replacement is noted. A nasogastric catheter is noted within the stomach just beyond the gastroesophageal junction. No free air is seen. Pleural plaques are noted bilaterally. IMPRESSION: Nasogastric catheter within the stomach. The degree of small bowel dilatation has improved from the prior CT examination. Electronically Signed   By: Inez Catalina M.D.   On: 11/04/2015 08:17   Dg Abd Portable 1v  Result Date: 11/09/2015 CLINICAL DATA:  Small bowel obstruction EXAM: PORTABLE ABDOMEN - 1 VIEW COMPARISON:  November 08, 2015 FINDINGS: Nasogastric tube tip and side port are in proximal stomach. There remain multiple loops of dilated small bowel in a pattern concerning for a degree of obstruction. No free air evident. There is evidence of prior calcified empyema is in the lung base regions. IMPRESSION: Bowel gas pattern remains of concern for a degree of bowel obstruction. No free air. Nasogastric tube tip and side port in proximal stomach. Electronically Signed   By: Lowella Grip III M.D.   On: 11/09/2015 07:01   Dg Abd Portable 1v  Result Date: 11/08/2015 CLINICAL DATA:  NG tube placement EXAM: PORTABLE ABDOMEN - 1 VIEW COMPARISON:  November 08, 2015 FINDINGS: The NG tube terminates in the distal esophagus. Recommend advancement. Persistent small bowel obstruction. Evaluation for free air is limited on this study but none is definitely seen. Pleural calcifications again identified. Minimal opacity in the lateral right lung is not completely excluded. No other acute abnormalities. IMPRESSION: 1. The NG tube side port is in the midesophagus and the distal tip is above the GE junction. Recommend advancement. 2. Persistent small bowel obstruction. 3. Calcified pleural plaques  persists. These results will be called to the ordering clinician or representative by the Radiologist Assistant, and communication documented in the PACS or zVision Dashboard. Electronically Signed   By: Dorise Bullion III M.D   On: 11/08/2015 12:59   Dg Abd Portable 1v  Result Date: 11/08/2015 CLINICAL DATA:  Small-bowel obstruction EXAM: PORTABLE ABDOMEN - 1 VIEW COMPARISON:  11/07/2015 FINDINGS: There is again noted small-bowel obstruction similar to that seen on the prior exam. Nasogastric catheter is not well appreciated on this exam. No definitive free air is seen. Left hip replacement and degenerative change of the lumbar spine are again noted. IMPRESSION: Persistent small bowel obstruction Electronically Signed   By: Inez Catalina M.D.   On: 11/08/2015 09:35   Dg Abd Portable 1v  Result Date: 11/07/2015 CLINICAL DATA:  Abdominal distention. EXAM: PORTABLE ABDOMEN - 1 VIEW COMPARISON:  CT 11/06/2015.  Abdominal series 813 2017, 11/05/2015 FINDINGS: NG tube noted coiled in the stomach. Progressive distention of multiple loops of small bowel are noted. Paucity of colonic gas noted. No pneumatosis noted. No free air identified. Severe degenerative changes and scoliosis lumbar spine. Degenerative changes right hip. Left hip prosthesis. Diffuse osteopenia. Surgical sutures left groin. IMPRESSION: 1. NG tube noted coiled stomach. 2. Progressive distention of multiple loops of small bowel. Small bowel loops are noted to be dilated up to 4.9 cm. Findings consistent with small-bowel obstruction. Electronically Signed   By: Marcello Moores  Register   On: 11/07/2015 06:43   Dg Abd  Portable 1v  Result Date: 11/03/2015 CLINICAL DATA:  Nasogastric tube placement. EXAM: PORTABLE ABDOMEN - 1 VIEW COMPARISON:  CT 11/03/2015 FINDINGS: Nasogastric tube has been placed, tip overlying the level of the proximal stomach and possibly within the hiatal hernia or proximal stomach. Bowel gas pattern shows persistent small bowel  dilatation. Contrast is identified within the urinary bladder following CT exam. IMPRESSION: Nasogastric tube tip likely within the proximal stomach or hiatal hernia. Persistent dilatation of small bowel loops. Electronically Signed   By: Nolon Nations M.D.   On: 11/03/2015 17:03    ASSESSMENT/PLAN:  Physical deconditioning - for rehabilitation, PT and OT to have therapeutic and strengthening exercises; follow-up precaution  Bezoar of ileum S/P exploratory laparotomy - for rehabilitation, PT and OT to have therapeutic and strengthening exercises; continue tramadol-acetaminophen 37.5-325 mg 1 tab by mouth every 6 hours when necessary for pain; follow-up with general surgery, Dr. [name], on 11/21/15 and 12/01/15  Aspiration pneumonia - treated with Unasyn 10 days; for SLP evaluation for swallowing function  Atrial fibrillation with RVR - new onset; rate controlled; EF of 25%; recently started on Eliquis 5 mg 1 tab by mouth twice a day and metoprolol succinate 25 mg 24 hour 1 tab by mouth daily  Chronic systolic CHF - EF of [id number]; continue metoprolol succinate 25 mg 24 hour 1 tab by mouth daily, lisinopril 5 mg 1 tab by mouth daily and Lasix alternating 40 mg and 20 mg every other day; check BMP  Hypokalemia - start KCL ER 10 meq 1 tab daily Lab Results  Component Value Date   K 3.4 (L) 11/16/2015   Hypertension - well controlled; continue lisinopril 5 mg 1 tab by mouth daily and metoprolol succinate 25 mg 24 hour 1 tab by mouth daily  Hard of hearing - has an appointment for hearing aid on 11/18/15  Protein calorie malnutrition, severe - albumin 1.7 - start Procel 2 scoops by mouth twice a day and RD consult  Leukocytosis - no fever, coughin nor congestion; will monitor Lab Results  Component Value Date   WBC 11.3 (H) 11/16/2015   Anemia, acute blood loss -  Stable; check CBC Lab Results  Component Value Date   HGB 11.0 (L) 11/16/2015   GERD - continue Nexium 40 mg 1  capsule by mouth daily      Goals of care:  Short-term rehabilitation     Durenda Age, NP [facility] [phone]

## 2015-11-18 ENCOUNTER — Non-Acute Institutional Stay (SKILLED_NURSING_FACILITY): Payer: Medicare Other | Admitting: Internal Medicine

## 2015-11-18 ENCOUNTER — Encounter: Payer: Self-pay | Admitting: Internal Medicine

## 2015-11-18 DIAGNOSIS — K56609 Unspecified intestinal obstruction, unspecified as to partial versus complete obstruction: Secondary | ICD-10-CM

## 2015-11-18 DIAGNOSIS — I10 Essential (primary) hypertension: Secondary | ICD-10-CM | POA: Diagnosis not present

## 2015-11-18 DIAGNOSIS — E876 Hypokalemia: Secondary | ICD-10-CM

## 2015-11-18 DIAGNOSIS — J690 Pneumonitis due to inhalation of food and vomit: Secondary | ICD-10-CM

## 2015-11-18 DIAGNOSIS — E46 Unspecified protein-calorie malnutrition: Secondary | ICD-10-CM

## 2015-11-18 DIAGNOSIS — K219 Gastro-esophageal reflux disease without esophagitis: Secondary | ICD-10-CM

## 2015-11-18 DIAGNOSIS — I5022 Chronic systolic (congestive) heart failure: Secondary | ICD-10-CM | POA: Diagnosis not present

## 2015-11-18 DIAGNOSIS — I482 Chronic atrial fibrillation, unspecified: Secondary | ICD-10-CM

## 2015-11-18 DIAGNOSIS — D72829 Elevated white blood cell count, unspecified: Secondary | ICD-10-CM

## 2015-11-18 DIAGNOSIS — K5669 Other intestinal obstruction: Secondary | ICD-10-CM | POA: Diagnosis not present

## 2015-11-18 DIAGNOSIS — D62 Acute posthemorrhagic anemia: Secondary | ICD-10-CM | POA: Diagnosis not present

## 2015-11-18 DIAGNOSIS — R5381 Other malaise: Secondary | ICD-10-CM

## 2015-11-18 LAB — CBC AND DIFFERENTIAL
HEMATOCRIT: 35 % — AB (ref 41–53)
HEMOGLOBIN: 11.3 g/dL — AB (ref 13.5–17.5)
Neutrophils Absolute: 8 /uL
PLATELETS: 353 10*3/uL (ref 150–399)
WBC: 11 10*3/mL

## 2015-11-18 LAB — BASIC METABOLIC PANEL
BUN: 13 mg/dL (ref 4–21)
CREATININE: 1.1 mg/dL (ref 0.6–1.3)
GLUCOSE: 91 mg/dL
POTASSIUM: 4.2 mmol/L (ref 3.4–5.3)
Sodium: 141 mmol/L (ref 137–147)

## 2015-11-18 NOTE — Progress Notes (Signed)
LOCATION: [facility]  PCP: Jenny Reichmann, MD   Code Status: DNR  Goals of care: Advanced Directive information Advanced Directives 11/17/2015  Does patient have an advance directive? Yes  Type of Advance Directive Out of facility DNR (pink MOST or yellow form)  Does patient want to make changes to advanced directive? No - Patient declined  Copy of advanced directive(s) in chart? Yes  Would patient like information on creating an advanced directive? -  Pre-existing out of facility DNR order (yellow form or pink MOST form) -       Extended Emergency Contact Information Primary Emergency Contact: Romulus,[name] Address: [address]          [place], [place] [address] Johnnette Litter of [place] Phone: [phone] Relation: Son Secondary Emergency Contact: Torre,Guy Address: [place], [place] Mobile Phone: [phone] Relation: Son   Allergies  Allergen Reactions  . Ciprofloxacin Other (See Comments)    Reaction:  Unknown   . Penicillins Rash and Other (See Comments)    Tolerated Unasyn 11/03/15 Has patient had a PCN reaction causing immediate rash, facial/tongue/throat swelling, SOB or lightheadedness with hypotension: No Has patient had a PCN reaction causing severe rash involving mucus membranes or skin necrosis: No Has patient had a PCN reaction that required hospitalization No Has patient had a PCN reaction occurring within the last 10 years: No If all of the above answers are "NO", then may proceed with Cephalosporin use.    Chief Complaint  Patient presents with  . New Admit To SNF    New Admission     HPI:  Patient is a 82 y.o. male seen today for short term rehabilitation post hospital admission from 11/03/15-11/16/15 with small bowel obstruction with bezoar of terminal ileum with near total obstruction. He underwent exploratory laprotomy with enterotomy on 11/09/15. Post surgery, he had acute  respiratory failure with aspiration pneumonia and was started on antibiotics. He has new afib with RVR this admission and required iv cardizem and lopressor and is now on eliquis and po rate controlling agent. He had EF of 25% on echocardiogram. He has PMH of ckd stage 2, chf, barrette's esophagus and vertebral compression fracture. He is seen in his room today.    Review of Systems:  Constitutional: Negative for fever, chills, diaphoresis. Energy level is good today. HENT: Negative for headache, congestion, nasal discharge Eyes: Negative for blurred vision, double vision and discharge. Wears glasses. Respiratory: Negative for cough, shortness of breath and wheezing.   Cardiovascular: Negative for chest pain, palpitations, leg swelling.  Gastrointestinal: Negative for heartburn, nausea, vomiting, abdominal pain. Last bowel movement was yesterday.  Genitourinary: Negative for dysuria Musculoskeletal: Negative for fall in the facility.  Skin: Negative for itching, rash.  Neurological: Negative for dizziness. Psychiatric/Behavioral: Negative for depression   Past Medical History:  Diagnosis Date  . Aspiration pneumonia ([facility])   . Atrial fibrillation with RVR ([facility])   . BARRETTS ESOPHAGUS 11/19/2007  . Blood transfusion without reported diagnosis   . Cardiomyopathy ([facility])   . CHANGE IN BOWELS 02/01/2009  . Chronic combined systolic (congestive) and diastolic (congestive) heart failure ([facility])    a. echo 2015: EF 45% b. Echo 10/2015: EF 25-30% with diffuse HK  . CKD stage G2/A2, GFR 60 - 89 and albumin creatinine ratio 30 - 299 mg/g   . Compression fracture of L2 lumbar vertebra ([facility])   . Diarrhea 01/31/2009  .  DIVERTICULOSIS, COLON 11/19/2007  . 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 GASTRITIS 11/19/2007  . GERD 11/19/2007  . HIATAL HERNIA 11/19/2007  . HOH (hard of hearing)   . HTN (hypertension)   . HYPERLIPIDEMIA 06/27/2009  . Hypokalemia   . HYPOTHYROIDISM 06/27/2009  . IBS (irritable bowel syndrome)     . Intussusception ([place] [facility])   . MELENA 08/01/2009  . Nonischemic cardiomyopathy ([facility])    a. NST in 2010 with no evidence of ischemia.  Marland Kitchen PERSONAL HX COLONIC POLYPS 02/01/2009  . Protein-calorie malnutrition, severe ([facility])   . TINEA CORPORIS 06/27/2009  . TOBACCO USE, QUIT 06/27/2009  . TREMOR, ESSENTIAL 06/27/2009   Past Surgical History:  Procedure Laterality Date  . BEZOAR EVACUATION  11/09/2015   ILEUM  . CHOLECYSTECTOMY    . ESOPHAGOGASTRODUODENOSCOPY N/A 04/28/2013   Procedure: ESOPHAGOGASTRODUODENOSCOPY (EGD);  Surgeon: Jerene Bears, MD;  Location: [facility];  Service: Gastroenterology;  Laterality: N/A;  . HERNIA REPAIR    . HIP ARTHROPLASTY Left 11/30/2013   Procedure: ARTHROPLASTY BIPOLAR HIP;  Surgeon: [name], MD;  Location: [facility];  Service: Orthopedics;  Laterality: Left;  . LAPAROTOMY N/A 11/09/2015   Procedure: EXPLORATORY LAPAROTOMY;  Surgeon: [name], MD;  Location: [facility];  Service: General;  Laterality: N/A;  . LUNG SURGERY  1984  . TONSILLECTOMY    . TONSILLECTOMY    . UMBILICAL HERNIA REPAIR    . VASECTOMY     Social History:   reports that he quit smoking about 72 years ago. His smoking use included Cigarettes. He has a 72.00 pack-year smoking history. He has never used smokeless tobacco. He reports that he drinks about 0.6 oz of alcohol per week . He reports that he does not use drugs.  Family History  Problem Relation Age of Onset  . Heart disease Mother   . Colon cancer Father   . Prostate cancer Brother     Medications:   Medication List       Accurate as of 11/18/15 12:50 PM. Always use your most recent med list.          acidophilus Caps capsule Take 1 capsule by mouth daily.   apixaban 5 MG Tabs tablet Commonly known as:  ELIQUIS Take 1 tablet (5 mg total) by mouth 2 (two) times daily.   esomeprazole 40 MG capsule Commonly known as:  NEXIUM Take 40 mg by mouth daily before breakfast.   furosemide 20 MG tablet Commonly known as:   LASIX Take 20 mg by mouth every other day.   furosemide 40 MG tablet Commonly known as:  LASIX Take 40 mg by mouth every other day.   lisinopril 5 MG tablet Commonly known as:  PRINIVIL,ZESTRIL Take 1 tablet (5 mg total) by mouth daily.   metoprolol succinate 25 MG 24 hr tablet Commonly known as:  TOPROL-XL Take 25 mg by mouth daily.   PRESERVISION AREDS 2 Caps Take 1 capsule by mouth 2 (two) times daily.   PROCEL Powd Take 2 scoop by mouth 2 (two) times daily.   simvastatin 20 MG tablet Commonly known as:  ZOCOR Take 20 mg by mouth at bedtime.   tamsulosin 0.4 MG Caps capsule Commonly known as:  FLOMAX Take 0.4 mg by mouth daily after supper.   traMADol-acetaminophen 37.5-325 MG tablet Commonly known as:  ULTRACET Take 1 tablet by mouth every 6 (six) hours as needed.   UNABLE TO FIND Med Name: MedPass 120 mL po qd for nutritional support  Immunizations: Immunization History  Administered Date(s) Administered  . Influenza Split 02/04/2012  . Influenza Whole 02/23/2009  . Influenza,inj,Quad PF,36+ Mos 02/09/2013, 12/28/2013, 12/31/2014  . Pneumococcal Conjugate-13 08/10/2013  . Pneumococcal Polysaccharide-23 03/27/2007  . Td 03/27/2007  . Zoster 01/24/2009     Physical Exam:  Vitals:   11/18/15 1246  BP: 129/70  Pulse: 76  Resp: 18  Temp: 97.4 F (36.3 C)  TempSrc: Oral  SpO2: 93%  Weight: 152 lb (68.9 kg)  Height: 5\' 5"  (1.651 m)   Body mass index is 25.29 kg/m.  General- elderly male, well built, in no acute distress Head- normocephalic, atraumatic Nose- no nasal discharge Throat- moist mucus membrane Eyes- PERRLA, EOMI, no pallor, no icterus, no discharge, normal conjunctiva, normal sclera Neck- no cervical lymphadenopathy Cardiovascular- normal s1,s2, no murmur, no leg edema Respiratory- bilateral clear to auscultation, no wheeze, no rhonchi, no crackles, no use of accessory muscles Abdomen- bowel sounds present, soft, non  tender Musculoskeletal- able to move all 4 extremities, generalized weakness Neurological- alert and oriented to person, place and time Skin- warm and dry, abdominal wall incision with staples in place, minimal erythema Psychiatry- normal mood and affect    Labs reviewed: Basic Metabolic Panel:  Recent Labs  11/10/15 0745 11/10/15 1352 11/11/15 0339  11/13/15 0345 11/14/15 0404 11/15/15 0604 11/16/15 0758  NA 140  --  141  < > 143 143 142 142  K 4.4 4.3 3.6  < > 3.8 3.2* 3.6 3.4*  CL 117*  --  114*  < > 114* 111 109 105  CO2 18*  --  21*  < > 21* 27 28 32  GLUCOSE 144*  --  98  < > 92 95 121* 100*  BUN 22*  --  20  < > 14 14 12 10   CREATININE 0.98  --  1.29*  < > 0.98 0.91 1.17 1.06  CALCIUM 7.3*  --  7.3*  < > 7.8* 7.6* 7.8* 7.9*  MG 1.6* 2.1 1.9  --  1.8  --   --   --   PHOS 3.1  --  3.4  --   --   --   --   --   < > = values in this interval not displayed. Liver Function Tests:  Recent Labs  03/16/15 0936 11/03/15 1430 11/11/15 0339  AST 11 66* 10*  ALT 9 42 12*  ALKPHOS 180* 137* 54  BILITOT 0.8 1.2 0.9  PROT 6.7 6.1* 4.3*  ALBUMIN 3.8 3.2* 1.7*    Recent Labs  11/03/15 1430  LIPASE 30   No results for input(s): AMMONIA in the last 8760 hours. CBC:  Recent Labs  11/05/15 0418 11/06/15 0323  11/11/15 0339  11/14/15 0838 11/15/15 0604 11/16/15 0758  WBC 13.7* 16.1*  < > 12.9*  < > 11.1* 12.0* 11.3*  NEUTROABS 11.8* 14.4*  --  10.0*  --   --   --   --   HGB 11.7* 12.3*  < > 10.5*  < > 11.5* 10.9* 11.0*  HCT 34.6* 37.0*  < > 30.5*  < > 34.4* 32.8* 33.6*  MCV 90.1 91.4  < > 88.4  < > 90.1 91.1 89.4  PLT 205 229  < > 302  < > 405* 405* 382  < > = values in this interval not displayed. Cardiac Enzymes:  Recent Labs  11/09/15 1803 11/10/15 0119 11/10/15 0425  TROPONINI 0.15* 0.10* 0.10*   BNP: Invalid input(s): POCBNP CBG:  Recent  Labs  11/15/15 2227 11/16/15 0729 11/16/15 1150  GLUCAP 95 78 103*    Radiological Exams: Ct Abdomen  Pelvis Wo Contrast  Result Date: 11/06/2015 CLINICAL DATA:  Intussusception with SBO KUB unremarkable, but distended. No flatus or BM. Continue NGT Still confused, in gloves. No abdominal pain. Difficult to assess. Now on 3rd day of NGT. EXAM: CT ABDOMEN AND PELVIS WITHOUT CONTRAST TECHNIQUE: Multidetector CT imaging of the abdomen and pelvis was performed following the standard protocol without IV contrast. COMPARISON:  11/03/2015 FINDINGS: Lung bases: There is dependent lung base opacity associated with densely calcified pleural plaques and minimal pleural effusions. Lung base opacity most consistent with atelectasis. Heart is mildly enlarged. No convincing pulmonary edema. Hepatobiliary: Liver is unremarkable. Gallbladder surgically absent. No bile duct dilation. Spleen: 7 mm low-density lesion posteriorly. This is consistent with a cyst and is stable. There calcified splenic granuloma, also stable. Spleen normal in size. Pancreas: Unremarkable. Adrenal glands:  No masses. Kidneys, ureters, bladder: Bilateral renal cortical thinning. Small hyper attenuating lesion from the lateral lower pole the left kidney measuring 7 mm, likely a cyst complicated by previous hemorrhage or proteinaceous contents. It is stable. No other renal masses, no stones and no hydronephrosis. Normal ureters. Bladder is unremarkable. Vascular: Diffuse ectasia of the abdominal aorta with atherosclerotic calcifications. Infrarenal aorta measures 3 cm greatest diameter. Loss Lymph nodes:  No adenopathy. Ascites: Small amount ascites noted adjacent to liver, along the pericolic gutters and in the posterior pelvic recess. Gastrointestinal: There are 2 contiguous densities in the distal ileum, 1 measuring 19 mm and the other 2.3 cm in greatest dimension. The current exam, this does not appear to be an intussusception but dense material within the small bowel. Bowel distal this is decompressed while bowel proximal to this is mildly distended.  The colon is mostly decompressed. There is no bowel wall thickening. Normal appendix is visualized. Musculoskeletal: Well-positioned left hip total arthroplasty. Bones diffusely demineralized. There is a marked compression fracture of L2, which appears chronic. Mild compression fractures of T12 and T8. Bones are diffusely demineralized. IMPRESSION: 1. Partial small bowel obstruction similar to the prior CT. What was felt be an ileal intussusception on the prior study now appears more consistent with 2 adjacent densities, consistent with stones or dense stool, in the distal ileum. These may or may not be the cause of the obstruction. Bowel distal to these densities is decompressed. There is currently no convincing intussusception. 2. Small amount ascites, increased when compared the prior CT. 3. Minimal pleural effusions and dependent lung base atelectasis, increased from the prior study. Densely calcified pleural plaque at the lung bases is stable. 4. Aortic atherosclerosis and aortic ectasia, of the infrarenal abdominal aorta measuring 3 cm. Recommend followup by ultrasound in 3 years. This recommendation follows ACR consensus guidelines: White Paper of the ACR Incidental Findings Committee II on Vascular Findings. Natasha [facility] Coll Radiol 2013; 10:789-794 Electronically Signed   By: Lajean Manes M.D.   On: 11/06/2015 11:25   Dg Abd 1 View  Result Date: 11/06/2015 CLINICAL DATA:  Followup for intussusception. EXAM: ABDOMEN - 1 VIEW COMPARISON:  11/05/2015 FINDINGS: Nasogastric tube passes below the diaphragm into the proximal stomach. There is mild small bowel dilation central abdomen. Air is seen within a normal caliber colon. There is some rectal air. Small bowel distention appears increased when compared to the previous day's study. IMPRESSION: 1. Mild small bowel dilation increased when compared to the previous day's study. 2. Nasogastric tube is well positioned.  Electronically Signed   By: Lajean Manes M.D.    On: 11/06/2015 08:55   Dg Abd 1 View  Result Date: 11/05/2015 CLINICAL DATA:  Nasogastric tube placement.  Initial encounter. EXAM: ABDOMEN - 1 VIEW COMPARISON:  Abdominal radiograph performed 11/04/2015 FINDINGS: The patient's enteric tube is seen ending overlying the body of the stomach. The visualized bowel gas pattern is grossly unremarkable. No free intra-abdominal air is seen, though evaluation for free air is limited on a single supine view. Scattered clips are seen at the right upper quadrant. Right basilar airspace opacity may reflect atelectasis or pneumonia. A small right pleural effusion is suspected. Prominent pleural calcification is noted at the medial right lung base. No acute osseous abnormalities are identified. Mild degenerative change is noted along the lower thoracic and lumbar spine. IMPRESSION: 1. Enteric tube seen ending overlying the body of the stomach. 2. Unremarkable bowel gas pattern; no free intra-abdominal air seen. 3. Right basilar airspace opacity may reflect atelectasis or pneumonia. Suspect small right pleural effusion. Electronically Signed   By: Garald Balding M.D.   On: 11/05/2015 06:47   Ct Abdomen Pelvis W Contrast  Result Date: 11/03/2015 CLINICAL DATA:  Intermittent left lower quadrant pain for several hours, initial encounter EXAM: CT ABDOMEN AND PELVIS WITH CONTRAST TECHNIQUE: Multidetector CT imaging of the abdomen and pelvis was performed using the standard protocol following bolus administration of intravenous contrast. CONTRAST:  38mL ISOVUE-300 IOPAMIDOL (ISOVUE-300) INJECTION 61% COMPARISON:  None. FINDINGS: Lower chest: Calcified pleural plaques are identified bilaterally. Mild bilateral lower lobe atelectatic changes are seen. No sizable effusion is noted. Hepatobiliary: Gallbladder is been surgically removed. The liver is within normal limits. Pancreas: There is 11 mm hypodensity noted in the head of the pancreas best seen on image number 31 of series 2.  Although this may represent a pancreatic cyst the possibility of small pancreatic lesion could not be totally excluded. Spleen: Small splenic cyst is noted. Adrenals/Urinary Tract: No masses identified. No evidence of hydronephrosis. Stomach/Bowel: There is small bowel dilatation with some mild inflammatory changes surrounding the small bowel. This obstructive pattern continues into the right lower quadrant head which point there is an intussusception of the ileum within the ileum. No definitive mass lesion is seen. Small bowel beyond this intussusception is within normal limits. Scattered small bowel diverticula are noted. A large fluid containing diverticulum of the duodenum is seen adjacent to the pancreatic head. Moderate-sized hiatal hernia is noted. Vascular/Lymphatic: No pathologically enlarged lymph nodes. No evidence of abdominal aortic aneurysm. Reproductive: No mass or other significant abnormality. Other: Mild free fluid is noted within the pelvis. Musculoskeletal: Postsurgical changes in the left hip are noted. Chronic compression deformities at T12 and L2 are noted IMPRESSION: Small-bowel obstruction secondary to an ileal to ileal intussusception in the right lower quadrant. Some inflammatory changes are noted surrounding the dilated loops in the left mid abdomen. No evidence of perforation is identified. Hypodensity within the head of the pancreas which may represent a complicated cyst although the possibility of a neoplasm could not be totally excluded. The need for further evaluation can be determined on a clinical basis. Bibasilar changes left greater than right likely representing some acute on chronic infiltrate/atelectasis. Critical Value/emergent results were called by telephone at the time of interpretation on 11/03/2015 at 3:54 pm to Dr. Jola Schmidt , who verbally acknowledged these results. Electronically Signed   By: Inez Catalina M.D.   On: 11/03/2015 15:56   Dg Chest [facility]  Result Date: 11/10/2015 CLINICAL DATA:  Hypoxia EXAM: PORTABLE CHEST 1 VIEW COMPARISON:  November 09, 2015 FINDINGS: Endotracheal tube tip is 2.1 cm above the carina. Nasogastric tube tip and side port are in the stomach. No pneumothorax. There is a stable nodular appearing opacity in the right lower lobe, likely a granuloma. There is patchy atelectasis in both lower lobes. Foci of calcification are noted along each pleural surface, more on the right than on the left, findings consistent with either previous empyema or asbestos exposure. There is no new opacity. Heart is mildly enlarged with pulmonary vascularity within normal limits. There is atherosclerotic calcification in the aorta. No adenopathy evident. There is degenerative change in each shoulder. IMPRESSION: Tube positions as described without pneumothorax. Bibasilar atelectasis. Areas of pleural calcification bilaterally, more on the right than on the left, stable. Probable granuloma right lower lobe. Aortic atherosclerosis. Stable cardiac prominence. No frank edema or consolidation evident. Electronically Signed   By: Lowella Grip III M.D.   On: 11/10/2015 08:42   Dg Chest Port 1 View  Result Date: 11/09/2015 CLINICAL DATA:  Intubation. EXAM: PORTABLE CHEST 1 VIEW COMPARISON:  11/08/2015 FINDINGS: Endotracheal tube has its tip 1 cm above the carina. Nasogastric tube enters the stomach. The heart is enlarged. There is aortic atherosclerosis. There is infiltrate and/or volume loss in the lower lungs bilaterally. Some of the pulmonary opacities are somewhat nodular and should be followed on subsequent exams. No effusions. IMPRESSION: Endotracheal tube tip 1 cm above the carina. Nasogastric tube enters stomach. Infiltrate and/or volume loss at both lung bases. Follow-up nodular shadows after treatment. Cardiomegaly.  Aortic atherosclerosis. Electronically Signed   By: Nelson Chimes M.D.   On: 11/09/2015 18:21   Dg Chest Port 1 View  Result  Date: 11/08/2015 CLINICAL DATA:  Hypertension.  Recent bowel intussusception EXAM: PORTABLE CHEST 1 VIEW COMPARISON:  November 07, 2015 FINDINGS: Nasogastric tube has been removed. There is patchy atelectasis in the right base region. Calcification on the right medially is likely due to prior emphysema, stable. No new opacity identified. Heart is upper normal in size with pulmonary vascularity within normal limits. There is atherosclerotic calcification aorta. Bones are osteoporotic. There is superior migration of each humeral head. IMPRESSION: Probable prior empyema on the right medially with calcification. Right base atelectasis. Lungs elsewhere clear. Stable cardiac silhouette. There is aortic atherosclerosis. There is evidence of bilateral chronic rotator cuff tears. Electronically Signed   By: Lowella Grip III M.D.   On: 11/08/2015 09:31   Dg Chest Port 1 View  Result Date: 11/07/2015 CLINICAL DATA:  Nasogastric tube. EXAM: PORTABLE CHEST 1 VIEW COMPARISON:  11/04/2015. FINDINGS: NG tube noted coiled stomach. Cardiomegaly with pulmonary vascular prominence interstitial prominence. No focal infiltrate. Mild bibasilar atelectasis. No pleural effusion or pneumothorax. IMPRESSION: 1. NG tube noted coiled stomach. 2. Cardiomegaly with pulmonary interstitial prominence consistent with congestive heart failure and interstitial edema. Small left pleural effusion. 3. Low lung volumes with bibasilar atelectasis . Electronically Signed   By: Marcello Moores  Register   On: 11/07/2015 09:00   Dg Chest Port 1 View  Result Date: 11/04/2015 CLINICAL DATA:  Nasogastric tube placement.  Initial encounter. EXAM: PORTABLE CHEST 1 VIEW COMPARISON:  Chest radiograph performed 02/10/2014 FINDINGS: The patient's enteric tube is seen ending overlying the body of the stomach, with the side port about the gastroesophageal junction. This could be advanced several centimeters, as deemed clinically appropriate. The lungs are  hypoexpanded. Bibasilar airspace opacities may reflect atelectasis or pneumonia. Small  bilateral pleural effusions are suspected. No pneumothorax is seen. The cardiomediastinal silhouette is borderline normal in size. No acute osseous abnormalities are identified. Clips are noted within the right upper quadrant, reflecting prior cholecystectomy. IMPRESSION: 1. Enteric tube noted ending overlying the body of the stomach, with the side port about the gastroesophageal junction. This could be advanced several centimeters, as deemed clinically appropriate. 2. Lungs hypoexpanded. Bibasilar airspace opacities may reflect atelectasis or pneumonia. Small bilateral pleural effusions suspected. Electronically Signed   By: Garald Balding M.D.   On: 11/04/2015 05:56   Dg Abd 2 Views  Result Date: 11/04/2015 CLINICAL DATA:  Check nasogastric catheter placement EXAM: ABDOMEN - 2 VIEW COMPARISON:  11/03/2015 FINDINGS: Scattered large and small bowel gas is noted. No obstructive changes seen. Bladder is well distended with contrast opacified urine. Left hip replacement is noted. A nasogastric catheter is noted within the stomach just beyond the gastroesophageal junction. No free air is seen. Pleural plaques are noted bilaterally. IMPRESSION: Nasogastric catheter within the stomach. The degree of small bowel dilatation has improved from the prior CT examination. Electronically Signed   By: Inez Catalina M.D.   On: 11/04/2015 08:17   Dg Abd Portable 1v  Result Date: 11/09/2015 CLINICAL DATA:  Small bowel obstruction EXAM: PORTABLE ABDOMEN - 1 VIEW COMPARISON:  November 08, 2015 FINDINGS: Nasogastric tube tip and side port are in proximal stomach. There remain multiple loops of dilated small bowel in a pattern concerning for a degree of obstruction. No free air evident. There is evidence of prior calcified empyema is in the lung base regions. IMPRESSION: Bowel gas pattern remains of concern for a degree of bowel obstruction. No  free air. Nasogastric tube tip and side port in proximal stomach. Electronically Signed   By: Lowella Grip III M.D.   On: 11/09/2015 07:01   Dg Abd Portable 1v  Result Date: 11/08/2015 CLINICAL DATA:  NG tube placement EXAM: PORTABLE ABDOMEN - 1 VIEW COMPARISON:  November 08, 2015 FINDINGS: The NG tube terminates in the distal esophagus. Recommend advancement. Persistent small bowel obstruction. Evaluation for free air is limited on this study but none is definitely seen. Pleural calcifications again identified. Minimal opacity in the lateral right lung is not completely excluded. No other acute abnormalities. IMPRESSION: 1. The NG tube side port is in the midesophagus and the distal tip is above the GE junction. Recommend advancement. 2. Persistent small bowel obstruction. 3. Calcified pleural plaques persists. These results will be called to the ordering clinician or representative by the Radiologist Assistant, and communication documented in the PACS or zVision Dashboard. Electronically Signed   By: Dorise Bullion III M.D   On: 11/08/2015 12:59   Dg Abd Portable 1v  Result Date: 11/08/2015 CLINICAL DATA:  Small-bowel obstruction EXAM: PORTABLE ABDOMEN - 1 VIEW COMPARISON:  11/07/2015 FINDINGS: There is again noted small-bowel obstruction similar to that seen on the prior exam. Nasogastric catheter is not well appreciated on this exam. No definitive free air is seen. Left hip replacement and degenerative change of the lumbar spine are again noted. IMPRESSION: Persistent small bowel obstruction Electronically Signed   By: Inez Catalina M.D.   On: 11/08/2015 09:35   Dg Abd Portable 1v  Result Date: 11/07/2015 CLINICAL DATA:  Abdominal distention. EXAM: PORTABLE ABDOMEN - 1 VIEW COMPARISON:  CT 11/06/2015.  Abdominal series 813 2017, 11/05/2015 FINDINGS: NG tube noted coiled in the stomach. Progressive distention of multiple loops of small bowel are noted. Paucity of colonic gas noted.  No pneumatosis  noted. No free air identified. Severe degenerative changes and scoliosis lumbar spine. Degenerative changes right hip. Left hip prosthesis. Diffuse osteopenia. Surgical sutures left groin. IMPRESSION: 1. NG tube noted coiled stomach. 2. Progressive distention of multiple loops of small bowel. Small bowel loops are noted to be dilated up to 4.9 cm. Findings consistent with small-bowel obstruction. Electronically Signed   By: Marcello Moores  Register   On: 11/07/2015 06:43   Dg Abd Portable 1v  Result Date: 11/03/2015 CLINICAL DATA:  Nasogastric tube placement. EXAM: PORTABLE ABDOMEN - 1 VIEW COMPARISON:  CT 11/03/2015 FINDINGS: Nasogastric tube has been placed, tip overlying the level of the proximal stomach and possibly within the hiatal hernia or proximal stomach. Bowel gas pattern shows persistent small bowel dilatation. Contrast is identified within the urinary bladder following CT exam. IMPRESSION: Nasogastric tube tip likely within the proximal stomach or hiatal hernia. Persistent dilatation of small bowel loops. Electronically Signed   By: Nolon Nations M.D.   On: 11/03/2015 17:03    Assessment/Plan  Physical deconditioning  Will have him work with physical therapy and occupational therapy team to help with gait training and muscle strengthening exercises.fall precautions. Skin care. Encourage to be out of bed.   Small bowel obstruction S/p laprotomy and enterotomy. Has follow up with general surgery. Will have patient work with PT/OT as tolerated to regain strength and restore function.  Fall precautions are in place. Continue abdominal incision care and monitor his bowel movement. Continue ultracet 37.5-325 mg 1 tab q6h prn pain.  Blood loss anemia Post op, monitor cbc  afib New onset. Continue metoprolol 25 mg daily with eliquis and monitor  Aspiration pneumonia Completed his antibiotic course, monitor clinically, aspiration precautions. SLP to follow. To use incentive spirometer for  pulmonary toileting  Leukocytosis Afebrile, monitor cbc  Protein calorie malnutrition RD to evaluate, continue medpass supplement  Chronic systolic CHF EF of [id number]. continue metoprolol succinate 25 mg daily with lisinopril 5 mg daily and current lasix regimen. Check bmp and weight.  Hypokalemia With him on lasix, continue kcl  Hypertension continue lisinopril 5 mg daily and metoprolol succinate 25 mg daily. Monitor BP  GERD continue nexium 40 mg daily   Goals of care: short term rehabilitation   Labs/tests ordered: cbc, cmp  Family/ staff Communication: reviewed care plan with patient and nursing supervisor    Blanchie Serve, MD Internal Medicine [address] [place], [place] [address] Cell Phone (Monday-Friday 8 am - 5 pm): [phone] On Call: [phone] and follow prompts after 5 pm and on weekends Office Phone: [phone] Office Fax: [phone]      Goals of care: short term rehabilitation   Labs/tests ordered:  Family/ staff Communication: reviewed care plan with patient and nursing supervisor    Blanchie Serve, MD Internal Medicine [place], [place] [address] Cell Phone (Monday-Friday 8 am - 5 pm): [phone] On Call: [phone] and follow prompts after 5 pm and on weekends Office Phone: [phone] Office Fax: [phone]

## 2015-11-22 ENCOUNTER — Non-Acute Institutional Stay (SKILLED_NURSING_FACILITY): Payer: Medicare Other | Admitting: Adult Health

## 2015-11-22 ENCOUNTER — Encounter: Payer: Self-pay | Admitting: Adult Health

## 2015-11-22 NOTE — Progress Notes (Signed)
Patient ID: [name], male   DOB: 10-Nov-1921, 82 y.o.   MRN: [id number]    DATE:  11/22/2015   MRN:  [id number]  BIRTHDAY: 04-16-1921  Facility:  Nursing Home Location:  [place] Room Number: 907-P  LEVEL OF CARE:  SNF (31)  Contact Information    Name Relation Home Work [place] Son   [phone]   [place], [place]   [phone]       Code Status History    Date Active Date Inactive Code Status Order ID Comments User Context   11/09/2015  5:31 PM 11/16/2015  7:25 PM Full Code [id number]  [name], MD Inpatient   11/03/2015  5:39 PM 11/09/2015  5:31 PM Full Code [id number]  [name], MD ED   11/30/2013  6:50 PM 12/04/2013  7:21 PM Full Code [id number]  [name], MD Inpatient   11/30/2013 12:46 PM 11/30/2013  6:50 PM Full Code [id number]  [name], MD Inpatient   07/09/2013  1:13 AM 07/15/2013  5:00 PM Full Code [id number]  [name], MD Inpatient    Advance Directive Documentation   Flowsheet Row Most Recent Value  Type of Advance Directive  Out of facility DNR (pink MOST or yellow form)  Pre-existing out of facility DNR order (yellow form or pink MOST form)  No data  "MOST" Form in Place?  No data       Chief Complaint  Patient presents with  . Acute Visit    Hypomagnesemia    HISTORY OF PRESENT ILLNESS:  This is a 82 year old male who has been noted to have Mg 1.7 .  No complaints of headache nor SOB.  He has been admitted to [facility] on 11/16/15 from [facility]. He has PMH of cardiomyopathy, lung lobectomy in the 80s, barrette's esophagus and vertebral compression fracture. He developed a sudden onset of abdominal pain with vomiting. CT scan done in the ED showed intussusception of ileum. While in the hospital, he developed a new onset of A. fib with RVR which was managed with IV Cardizem and IV Lopressor. 2-D echo showed EF of 25% which dropped from 45% previously. The small bowel obstruction was  found to be due to be bezoar of terminal ileum with near total obstruction and underwent enterotomy with removal on 11/09/15. He had acute hypoxic respiratory failure and chest x-ray showed by basilar opacities with hypoexpanded lungs. He was treated with Unasyn 10 days.  He is currently having a short-term rehabilitation.  PAST MEDICAL HISTORY:  Past Medical History:  Diagnosis Date  . Aspiration pneumonia ([place])   . Atrial fibrillation with RVR ([place])   . BARRETTS ESOPHAGUS 11/19/2007  . Blood transfusion without reported diagnosis   . Cardiomyopathy ([place])   . CHANGE IN BOWELS 02/01/2009  . Chronic combined systolic (congestive) and diastolic (congestive) heart failure ([place])    a. echo 2015: EF 45% b. Echo 10/2015: EF 25-30% with diffuse HK  . CKD stage G2/A2, GFR 60 - 89 and albumin creatinine ratio 30 - 299 mg/g   . Compression fracture of L2 lumbar vertebra ([facility])   . Diarrhea 01/31/2009  . DIVERTICULOSIS, COLON 11/19/2007  . 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 GASTRITIS 11/19/2007  . GERD 11/19/2007  . HIATAL HERNIA 11/19/2007  . HOH (hard of hearing)   . HTN (hypertension)   . HYPERLIPIDEMIA 06/27/2009  . Hypokalemia   . Hypomagnesemia 10/2015  . HYPOTHYROIDISM 06/27/2009  . IBS (  irritable bowel syndrome)   . Intussusception ([place])   . MELENA 08/01/2009  . Nonischemic cardiomyopathy ([place])    a. NST in 2010 with no evidence of ischemia.  Marland Kitchen PERSONAL HX COLONIC POLYPS 02/01/2009  . Protein-calorie malnutrition, severe ([place])   . TINEA CORPORIS 06/27/2009  . TOBACCO USE, QUIT 06/27/2009  . TREMOR, ESSENTIAL 06/27/2009     CURRENT MEDICATIONS: Reviewed  Patient's Medications  New Prescriptions   No medications on file  Previous Medications   ACIDOPHILUS (RISAQUAD) CAPS CAPSULE    Take 1 capsule by mouth daily.    APIXABAN (ELIQUIS) 5 MG TABS TABLET    Take 1 tablet (5 mg total) by mouth 2 (two) times daily.   ESOMEPRAZOLE (NEXIUM) 40 MG CAPSULE    Take 40 mg by mouth daily before breakfast.    FUROSEMIDE (LASIX) 20 MG TABLET    Take 20 mg by mouth every other day.    FUROSEMIDE (LASIX) 40 MG TABLET    Take 40 mg by mouth every other day.    LISINOPRIL (PRINIVIL,ZESTRIL) 5 MG TABLET    Take 1 tablet (5 mg total) by mouth daily.   METOPROLOL SUCCINATE (TOPROL-XL) 25 MG 24 HR TABLET    Take 25 mg by mouth daily.    MULTIPLE VITAMINS-MINERALS (PRESERVISION AREDS 2) CAPS    Take 1 capsule by mouth 2 (two) times daily.    POTASSIUM CHLORIDE (K-DUR,KLOR-CON) 10 MEQ TABLET    Take 10 mEq by mouth daily.   PROTEIN (PROCEL) POWD    Take 2 scoop by mouth 2 (two) times daily.   SIMVASTATIN (ZOCOR) 20 MG TABLET    Take 20 mg by mouth at bedtime.    TAMSULOSIN (FLOMAX) 0.4 MG CAPS CAPSULE    Take 0.4 mg by mouth daily after supper.    TRAMADOL-ACETAMINOPHEN (ULTRACET) 37.5-325 MG TABLET    Take 1 tablet by mouth every 6 (six) hours as needed.   UNABLE TO FIND    Med Name: MedPass 120 mL po TID for nutritional support  Modified Medications   No medications on file  Discontinued Medications   No medications on file     Allergies  Allergen Reactions  . Ciprofloxacin Other (See Comments)    Reaction:  Unknown   . Penicillins Rash and Other (See Comments)    Tolerated Unasyn 11/03/15 Has patient had a PCN reaction causing immediate rash, facial/tongue/throat swelling, SOB or lightheadedness with hypotension: No Has patient had a PCN reaction causing severe rash involving mucus membranes or skin necrosis: No Has patient had a PCN reaction that required hospitalization No Has patient had a PCN reaction occurring within the last 10 years: No If all of the above answers are "NO", then may proceed with Cephalosporin use.     REVIEW OF SYSTEMS:  GENERAL: no change in appetite, no fatigue, no weight changes, no fever, chills or weakness EYES: Denies change in vision, dry eyes, eye pain, itching or discharge EARS: Denies change in hearing, ringing in ears, or earache NOSE: Denies nasal congestion  or epistaxis MOUTH and THROAT: Denies oral discomfort, gingival pain or bleeding, pain from teeth or hoarseness   RESPIRATORY: no cough, SOB, DOE, wheezing, hemoptysis CARDIAC: no chest pain, edema or palpitations GI: no abdominal pain, diarrhea, constipation, heart burn, nausea or vomiting GU: Denies dysuria, frequency, hematuria, incontinence, or discharge PSYCHIATRIC: Denies feeling of depression or anxiety. No report of hallucinations, insomnia, paranoia, or agitation    PHYSICAL EXAMINATION  GENERAL APPEARANCE: Well nourished. In  no acute distress. Normal body habitus SKIN:  Midline abdominal incision has steri-strips, dry and no erythema HEAD: Normal in size and contour. No evidence of trauma EYES: Lids open and close normally. No blepharitis, entropion or ectropion. PERRL. Conjunctivae are clear and sclerae are white. Lenses are without opacity EARS: Pinnae are normal.Hard of hearing. MOUTH and THROAT: Lips are without lesions. Oral mucosa is moist and without lesions. Tongue is normal in shape, size, and color and without lesions NECK: supple, trachea midline, no neck masses, no thyroid tenderness, no thyromegaly LYMPHATICS: no LAN in the neck, no supraclavicular LAN RESPIRATORY: breathing is even & unlabored, BS CTAB CARDIAC: RRR, no murmur,no extra heart sounds, no edema GI: abdomen soft, normal BS, no masses, no tenderness, no hepatomegaly, no splenomegaly EXTREMITIES:  Able to move 4 extremities PSYCHIATRIC: Alert to place and person, disoriented to time.  Affect and behavior are appropriate  LABS/RADIOLOGY: Labs reviewed: Basic Metabolic Panel:  Recent Labs  11/10/15 0745 11/10/15 1352 11/11/15 0339  11/13/15 0345 11/14/15 0404 11/15/15 0604 11/16/15 0758 11/18/15 1229  NA 140  --  141  < > 143 143 142 142 141  K 4.4 4.3 3.6  < > 3.8 3.2* 3.6 3.4* 4.2  CL 117*  --  114*  < > 114* 111 109 105  --   CO2 18*  --  21*  < > 21* 27 28 32  --   GLUCOSE 144*  --  98   < > 92 95 121* 100*  --   BUN 22*  --  20  < > 14 14 12 10 13   CREATININE 0.98  --  1.29*  < > 0.98 0.91 1.17 1.06 1.1  CALCIUM 7.3*  --  7.3*  < > 7.8* 7.6* 7.8* 7.9*  --   MG 1.6* 2.1 1.9  --  1.8  --   --   --   --   PHOS 3.1  --  3.4  --   --   --   --   --   --   < > = values in this interval not displayed. Liver Function Tests:  Recent Labs  03/16/15 0936 11/03/15 1430 11/11/15 0339  AST 11 66* 10*  ALT 9 42 12*  ALKPHOS 180* 137* 54  BILITOT 0.8 1.2 0.9  PROT 6.7 6.1* 4.3*  ALBUMIN 3.8 3.2* 1.7*    Recent Labs  11/03/15 1430  LIPASE 30   CBC:  Recent Labs  11/05/15 0418 11/06/15 0323  11/11/15 0339  11/14/15 0838 11/15/15 0604 11/16/15 0758  WBC 13.7* 16.1*  < > 12.9*  < > 11.1* 12.0* 11.3*  NEUTROABS 11.8* 14.4*  --  10.0*  --   --   --   --   HGB 11.7* 12.3*  < > 10.5*  < > 11.5* 10.9* 11.0*  HCT 34.6* 37.0*  < > 30.5*  < > 34.4* 32.8* 33.6*  MCV 90.1 91.4  < > 88.4  < > 90.1 91.1 89.4  PLT 205 229  < > 302  < > 405* 405* 382  < > = values in this interval not displayed.  Lipid Panel:  Recent Labs  12/31/14 0952 06/30/15 0949  HDL 29* 42   Cardiac Enzymes:  Recent Labs  11/09/15 1803 11/10/15 0119 11/10/15 0425  TROPONINI 0.15* 0.10* 0.10*   CBG:  Recent Labs  11/15/15 2227 11/16/15 0729 11/16/15 1150  GLUCAP 95 78 103*      Ct Abdomen Pelvis  Wo Contrast  Result Date: 11/06/2015 CLINICAL DATA:  Intussusception with SBO KUB unremarkable, but distended. No flatus or BM. Continue NGT Still confused, in gloves. No abdominal pain. Difficult to assess. Now on 3rd day of NGT. EXAM: CT ABDOMEN AND PELVIS WITHOUT CONTRAST TECHNIQUE: Multidetector CT imaging of the abdomen and pelvis was performed following the standard protocol without IV contrast. COMPARISON:  11/03/2015 FINDINGS: Lung bases: There is dependent lung base opacity associated with densely calcified pleural plaques and minimal pleural effusions. Lung base opacity most  consistent with atelectasis. Heart is mildly enlarged. No convincing pulmonary edema. Hepatobiliary: Liver is unremarkable. Gallbladder surgically absent. No bile duct dilation. Spleen: 7 mm low-density lesion posteriorly. This is consistent with a cyst and is stable. There calcified splenic granuloma, also stable. Spleen normal in size. Pancreas: Unremarkable. Adrenal glands:  No masses. Kidneys, ureters, bladder: Bilateral renal cortical thinning. Small hyper attenuating lesion from the lateral lower pole the left kidney measuring 7 mm, likely a cyst complicated by previous hemorrhage or proteinaceous contents. It is stable. No other renal masses, no stones and no hydronephrosis. Normal ureters. Bladder is unremarkable. Vascular: Diffuse ectasia of the abdominal aorta with atherosclerotic calcifications. Infrarenal aorta measures 3 cm greatest diameter. Loss Lymph nodes:  No adenopathy. Ascites: Small amount ascites noted adjacent to liver, along the pericolic gutters and in the posterior pelvic recess. Gastrointestinal: There are 2 contiguous densities in the distal ileum, 1 measuring 19 mm and the other 2.3 cm in greatest dimension. The current exam, this does not appear to be an intussusception but dense material within the small bowel. Bowel distal this is decompressed while bowel proximal to this is mildly distended. The colon is mostly decompressed. There is no bowel wall thickening. Normal appendix is visualized. Musculoskeletal: Well-positioned left hip total arthroplasty. Bones diffusely demineralized. There is a marked compression fracture of L2, which appears chronic. Mild compression fractures of T12 and T8. Bones are diffusely demineralized. IMPRESSION: 1. Partial small bowel obstruction similar to the prior CT. What was felt be an ileal intussusception on the prior study now appears more consistent with 2 adjacent densities, consistent with stones or dense stool, in the distal ileum. These may or may  not be the cause of the obstruction. Bowel distal to these densities is decompressed. There is currently no convincing intussusception. 2. Small amount ascites, increased when compared the prior CT. 3. Minimal pleural effusions and dependent lung base atelectasis, increased from the prior study. Densely calcified pleural plaque at the lung bases is stable. 4. Aortic atherosclerosis and aortic ectasia, of the infrarenal abdominal aorta measuring 3 cm. Recommend followup by ultrasound in 3 years. This recommendation follows ACR consensus guidelines: White Paper of the ACR Incidental Findings Committee II on Vascular Findings. Natasha [facility] Coll Radiol 2013; 10:789-794 Electronically Signed   By: Lajean Manes M.D.   On: 11/06/2015 11:25   Dg Abd 1 View  Result Date: 11/06/2015 CLINICAL DATA:  Followup for intussusception. EXAM: ABDOMEN - 1 VIEW COMPARISON:  11/05/2015 FINDINGS: Nasogastric tube passes below the diaphragm into the proximal stomach. There is mild small bowel dilation central abdomen. Air is seen within a normal caliber colon. There is some rectal air. Small bowel distention appears increased when compared to the previous day's study. IMPRESSION: 1. Mild small bowel dilation increased when compared to the previous day's study. 2. Nasogastric tube is well positioned. Electronically Signed   By: Lajean Manes M.D.   On: 11/06/2015 08:55   Dg Abd 1 View  Result Date: 11/05/2015 CLINICAL DATA:  Nasogastric tube placement.  Initial encounter. EXAM: ABDOMEN - 1 VIEW COMPARISON:  Abdominal radiograph performed 11/04/2015 FINDINGS: The patient's enteric tube is seen ending overlying the body of the stomach. The visualized bowel gas pattern is grossly unremarkable. No free intra-abdominal air is seen, though evaluation for free air is limited on a single supine view. Scattered clips are seen at the right upper quadrant. Right basilar airspace opacity may reflect atelectasis or pneumonia. A small right pleural  effusion is suspected. Prominent pleural calcification is noted at the medial right lung base. No acute osseous abnormalities are identified. Mild degenerative change is noted along the lower thoracic and lumbar spine. IMPRESSION: 1. Enteric tube seen ending overlying the body of the stomach. 2. Unremarkable bowel gas pattern; no free intra-abdominal air seen. 3. Right basilar airspace opacity may reflect atelectasis or pneumonia. Suspect small right pleural effusion. Electronically Signed   By: Garald Balding M.D.   On: 11/05/2015 06:47   Ct Abdomen Pelvis W Contrast  Result Date: 11/03/2015 CLINICAL DATA:  Intermittent left lower quadrant pain for several hours, initial encounter EXAM: CT ABDOMEN AND PELVIS WITH CONTRAST TECHNIQUE: Multidetector CT imaging of the abdomen and pelvis was performed using the standard protocol following bolus administration of intravenous contrast. CONTRAST:  79mL ISOVUE-300 IOPAMIDOL (ISOVUE-300) INJECTION 61% COMPARISON:  None. FINDINGS: Lower chest: Calcified pleural plaques are identified bilaterally. Mild bilateral lower lobe atelectatic changes are seen. No sizable effusion is noted. Hepatobiliary: Gallbladder is been surgically removed. The liver is within normal limits. Pancreas: There is 11 mm hypodensity noted in the head of the pancreas best seen on image number 31 of series 2. Although this may represent a pancreatic cyst the possibility of small pancreatic lesion could not be totally excluded. Spleen: Small splenic cyst is noted. Adrenals/Urinary Tract: No masses identified. No evidence of hydronephrosis. Stomach/Bowel: There is small bowel dilatation with some mild inflammatory changes surrounding the small bowel. This obstructive pattern continues into the right lower quadrant head which point there is an intussusception of the ileum within the ileum. No definitive mass lesion is seen. Small bowel beyond this intussusception is within normal limits. Scattered small  bowel diverticula are noted. A large fluid containing diverticulum of the duodenum is seen adjacent to the pancreatic head. Moderate-sized hiatal hernia is noted. Vascular/Lymphatic: No pathologically enlarged lymph nodes. No evidence of abdominal aortic aneurysm. Reproductive: No mass or other significant abnormality. Other: Mild free fluid is noted within the pelvis. Musculoskeletal: Postsurgical changes in the left hip are noted. Chronic compression deformities at T12 and L2 are noted IMPRESSION: Small-bowel obstruction secondary to an ileal to ileal intussusception in the right lower quadrant. Some inflammatory changes are noted surrounding the dilated loops in the left mid abdomen. No evidence of perforation is identified. Hypodensity within the head of the pancreas which may represent a complicated cyst although the possibility of a neoplasm could not be totally excluded. The need for further evaluation can be determined on a clinical basis. Bibasilar changes left greater than right likely representing some acute on chronic infiltrate/atelectasis. Critical Value/emergent results were called by telephone at the time of interpretation on 11/03/2015 at 3:54 pm to Dr. Jola Schmidt , who verbally acknowledged these results. Electronically Signed   By: Inez Catalina M.D.   On: 11/03/2015 15:56   Dg Chest Port 1 View  Result Date: 11/10/2015 CLINICAL DATA:  Hypoxia EXAM: PORTABLE CHEST 1 VIEW COMPARISON:  November 09, 2015 FINDINGS: Endotracheal tube tip  is 2.1 cm above the carina. Nasogastric tube tip and side port are in the stomach. No pneumothorax. There is a stable nodular appearing opacity in the right lower lobe, likely a granuloma. There is patchy atelectasis in both lower lobes. Foci of calcification are noted along each pleural surface, more on the right than on the left, findings consistent with either previous empyema or asbestos exposure. There is no new opacity. Heart is mildly enlarged with pulmonary  vascularity within normal limits. There is atherosclerotic calcification in the aorta. No adenopathy evident. There is degenerative change in each shoulder. IMPRESSION: Tube positions as described without pneumothorax. Bibasilar atelectasis. Areas of pleural calcification bilaterally, more on the right than on the left, stable. Probable granuloma right lower lobe. Aortic atherosclerosis. Stable cardiac prominence. No frank edema or consolidation evident. Electronically Signed   By: Lowella Grip III M.D.   On: 11/10/2015 08:42   Dg Chest Port 1 View  Result Date: 11/09/2015 CLINICAL DATA:  Intubation. EXAM: PORTABLE CHEST 1 VIEW COMPARISON:  11/08/2015 FINDINGS: Endotracheal tube has its tip 1 cm above the carina. Nasogastric tube enters the stomach. The heart is enlarged. There is aortic atherosclerosis. There is infiltrate and/or volume loss in the lower lungs bilaterally. Some of the pulmonary opacities are somewhat nodular and should be followed on subsequent exams. No effusions. IMPRESSION: Endotracheal tube tip 1 cm above the carina. Nasogastric tube enters stomach. Infiltrate and/or volume loss at both lung bases. Follow-up nodular shadows after treatment. Cardiomegaly.  Aortic atherosclerosis. Electronically Signed   By: Nelson Chimes M.D.   On: 11/09/2015 18:21   Dg Chest Port 1 View  Result Date: 11/08/2015 CLINICAL DATA:  Hypertension.  Recent bowel intussusception EXAM: PORTABLE CHEST 1 VIEW COMPARISON:  November 07, 2015 FINDINGS: Nasogastric tube has been removed. There is patchy atelectasis in the right base region. Calcification on the right medially is likely due to prior emphysema, stable. No new opacity identified. Heart is upper normal in size with pulmonary vascularity within normal limits. There is atherosclerotic calcification aorta. Bones are osteoporotic. There is superior migration of each humeral head. IMPRESSION: Probable prior empyema on the right medially with calcification.  Right base atelectasis. Lungs elsewhere clear. Stable cardiac silhouette. There is aortic atherosclerosis. There is evidence of bilateral chronic rotator cuff tears. Electronically Signed   By: Lowella Grip III M.D.   On: 11/08/2015 09:31   Dg Chest Port 1 View  Result Date: 11/07/2015 CLINICAL DATA:  Nasogastric tube. EXAM: PORTABLE CHEST 1 VIEW COMPARISON:  11/04/2015. FINDINGS: NG tube noted coiled stomach. Cardiomegaly with pulmonary vascular prominence interstitial prominence. No focal infiltrate. Mild bibasilar atelectasis. No pleural effusion or pneumothorax. IMPRESSION: 1. NG tube noted coiled stomach. 2. Cardiomegaly with pulmonary interstitial prominence consistent with congestive heart failure and interstitial edema. Small left pleural effusion. 3. Low lung volumes with bibasilar atelectasis . Electronically Signed   By: Marcello Moores  Register   On: 11/07/2015 09:00   Dg Chest Port 1 View  Result Date: 11/04/2015 CLINICAL DATA:  Nasogastric tube placement.  Initial encounter. EXAM: PORTABLE CHEST 1 VIEW COMPARISON:  Chest radiograph performed 02/10/2014 FINDINGS: The patient's enteric tube is seen ending overlying the body of the stomach, with the side port about the gastroesophageal junction. This could be advanced several centimeters, as deemed clinically appropriate. The lungs are hypoexpanded. Bibasilar airspace opacities may reflect atelectasis or pneumonia. Small bilateral pleural effusions are suspected. No pneumothorax is seen. The cardiomediastinal silhouette is borderline normal in size. No acute osseous abnormalities  are identified. Clips are noted within the right upper quadrant, reflecting prior cholecystectomy. IMPRESSION: 1. Enteric tube noted ending overlying the body of the stomach, with the side port about the gastroesophageal junction. This could be advanced several centimeters, as deemed clinically appropriate. 2. Lungs hypoexpanded. Bibasilar airspace opacities may reflect  atelectasis or pneumonia. Small bilateral pleural effusions suspected. Electronically Signed   By: Garald Balding M.D.   On: 11/04/2015 05:56   Dg Abd 2 Views  Result Date: 11/04/2015 CLINICAL DATA:  Check nasogastric catheter placement EXAM: ABDOMEN - 2 VIEW COMPARISON:  11/03/2015 FINDINGS: Scattered large and small bowel gas is noted. No obstructive changes seen. Bladder is well distended with contrast opacified urine. Left hip replacement is noted. A nasogastric catheter is noted within the stomach just beyond the gastroesophageal junction. No free air is seen. Pleural plaques are noted bilaterally. IMPRESSION: Nasogastric catheter within the stomach. The degree of small bowel dilatation has improved from the prior CT examination. Electronically Signed   By: Inez Catalina M.D.   On: 11/04/2015 08:17   Dg Abd Portable 1v  Result Date: 11/09/2015 CLINICAL DATA:  Small bowel obstruction EXAM: PORTABLE ABDOMEN - 1 VIEW COMPARISON:  November 08, 2015 FINDINGS: Nasogastric tube tip and side port are in proximal stomach. There remain multiple loops of dilated small bowel in a pattern concerning for a degree of obstruction. No free air evident. There is evidence of prior calcified empyema is in the lung base regions. IMPRESSION: Bowel gas pattern remains of concern for a degree of bowel obstruction. No free air. Nasogastric tube tip and side port in proximal stomach. Electronically Signed   By: Lowella Grip III M.D.   On: 11/09/2015 07:01   Dg Abd Portable 1v  Result Date: 11/08/2015 CLINICAL DATA:  NG tube placement EXAM: PORTABLE ABDOMEN - 1 VIEW COMPARISON:  November 08, 2015 FINDINGS: The NG tube terminates in the distal esophagus. Recommend advancement. Persistent small bowel obstruction. Evaluation for free air is limited on this study but none is definitely seen. Pleural calcifications again identified. Minimal opacity in the lateral right lung is not completely excluded. No other acute abnormalities.  IMPRESSION: 1. The NG tube side port is in the midesophagus and the distal tip is above the GE junction. Recommend advancement. 2. Persistent small bowel obstruction. 3. Calcified pleural plaques persists. These results will be called to the ordering clinician or representative by the Radiologist Assistant, and communication documented in the PACS or zVision Dashboard. Electronically Signed   By: Dorise Bullion III M.D   On: 11/08/2015 12:59   Dg Abd Portable 1v  Result Date: 11/08/2015 CLINICAL DATA:  Small-bowel obstruction EXAM: PORTABLE ABDOMEN - 1 VIEW COMPARISON:  11/07/2015 FINDINGS: There is again noted small-bowel obstruction similar to that seen on the prior exam. Nasogastric catheter is not well appreciated on this exam. No definitive free air is seen. Left hip replacement and degenerative change of the lumbar spine are again noted. IMPRESSION: Persistent small bowel obstruction Electronically Signed   By: Inez Catalina M.D.   On: 11/08/2015 09:35   Dg Abd Portable 1v  Result Date: 11/07/2015 CLINICAL DATA:  Abdominal distention. EXAM: PORTABLE ABDOMEN - 1 VIEW COMPARISON:  CT 11/06/2015.  Abdominal series 813 2017, 11/05/2015 FINDINGS: NG tube noted coiled in the stomach. Progressive distention of multiple loops of small bowel are noted. Paucity of colonic gas noted. No pneumatosis noted. No free air identified. Severe degenerative changes and scoliosis lumbar spine. Degenerative changes right hip. Left hip  prosthesis. Diffuse osteopenia. Surgical sutures left groin. IMPRESSION: 1. NG tube noted coiled stomach. 2. Progressive distention of multiple loops of small bowel. Small bowel loops are noted to be dilated up to 4.9 cm. Findings consistent with small-bowel obstruction. Electronically Signed   By: Marcello Moores  Register   On: 11/07/2015 06:43   Dg Abd Portable 1v  Result Date: 11/03/2015 CLINICAL DATA:  Nasogastric tube placement. EXAM: PORTABLE ABDOMEN - 1 VIEW COMPARISON:  CT 11/03/2015  FINDINGS: Nasogastric tube has been placed, tip overlying the level of the proximal stomach and possibly within the hiatal hernia or proximal stomach. Bowel gas pattern shows persistent small bowel dilatation. Contrast is identified within the urinary bladder following CT exam. IMPRESSION: Nasogastric tube tip likely within the proximal stomach or hiatal hernia. Persistent dilatation of small bowel loops. Electronically Signed   By: Nolon Nations M.D.   On: 11/03/2015 17:03    ASSESSMENT/PLAN:  Hypomagnesemia - give Magnesium Oxide 200 mg 1 tab PO daily X 3 days; re-check Mg level on 11/29/15    Durenda Age, NP [facility] [phone]

## 2015-12-01 ENCOUNTER — Encounter: Payer: Self-pay | Admitting: Adult Health

## 2015-12-01 NOTE — Progress Notes (Signed)
Patient ID: [name], male   DOB: Feb 08, 1922, 82 y.o.   MRN: [id number]

## 2015-12-05 LAB — BASIC METABOLIC PANEL
BUN: 33 mg/dL — AB (ref 4–21)
CREATININE: 1.2 mg/dL (ref 0.6–1.3)
Glucose: 88 mg/dL
POTASSIUM: 4.5 mmol/L (ref 3.4–5.3)
Sodium: 140 mmol/L (ref 137–147)

## 2015-12-09 ENCOUNTER — Non-Acute Institutional Stay (SKILLED_NURSING_FACILITY): Payer: Medicare Other | Admitting: Adult Health

## 2015-12-09 ENCOUNTER — Encounter: Payer: Self-pay | Admitting: Adult Health

## 2015-12-09 DIAGNOSIS — I482 Chronic atrial fibrillation, unspecified: Secondary | ICD-10-CM

## 2015-12-09 DIAGNOSIS — R5381 Other malaise: Secondary | ICD-10-CM

## 2015-12-09 DIAGNOSIS — K219 Gastro-esophageal reflux disease without esophagitis: Secondary | ICD-10-CM | POA: Diagnosis not present

## 2015-12-09 DIAGNOSIS — E43 Unspecified severe protein-calorie malnutrition: Secondary | ICD-10-CM

## 2015-12-09 DIAGNOSIS — T189XXS Foreign body of alimentary tract, part unspecified, sequela: Secondary | ICD-10-CM

## 2015-12-09 DIAGNOSIS — E46 Unspecified protein-calorie malnutrition: Secondary | ICD-10-CM

## 2015-12-09 DIAGNOSIS — E876 Hypokalemia: Secondary | ICD-10-CM

## 2015-12-09 DIAGNOSIS — D62 Acute posthemorrhagic anemia: Secondary | ICD-10-CM | POA: Diagnosis not present

## 2015-12-09 DIAGNOSIS — I5022 Chronic systolic (congestive) heart failure: Secondary | ICD-10-CM

## 2015-12-09 NOTE — Progress Notes (Signed)
Patient ID: [name], male   DOB: 22-Jun-1921, 82 y.o.   MRN: [id number]    DATE:  12/09/2015   MRN:  [id number]  BIRTHDAY: 05-May-1921  Facility:  [facility] Home Location:  [place] Room Number: 907-P  LEVEL OF CARE:  SNF (31)  Contact Information    Name Relation Home Work [place] Son   [phone]   Lionardo, [place]   [phone]       Code Status History    Date Active Date Inactive Code Status Order ID Comments User Context   11/09/2015  5:31 PM 11/16/2015  7:25 PM Full Code [id number]  [name], MD Inpatient   11/03/2015  5:39 PM 11/09/2015  5:31 PM Full Code [id number]  [name], MD ED   11/30/2013  6:50 PM 12/04/2013  7:21 PM Full Code [id number]  [name], MD Inpatient   11/30/2013 12:46 PM 11/30/2013  6:50 PM Full Code [id number]  [name], MD Inpatient   07/09/2013  1:13 AM 07/15/2013  5:00 PM Full Code [id number]  [name], MD Inpatient    Advance Directive Documentation   Flowsheet Row Most Recent Value  Type of Advance Directive  Out of facility DNR (pink MOST or yellow form)  Pre-existing out of facility DNR order (yellow form or pink MOST form)  No data  "MOST" Form in Place?  No data       Chief Complaint  Patient presents with  . Discharge Note    HISTORY OF PRESENT ILLNESS:  This is a 82 year old male who is for discharge home with Home health PT, OT, CNA and ST. He will discharge home with medications and prescriptions.  He has been admitted to [facility] on 11/16/15 from [facility]. He has PMH of cardiomyopathy, lung lobectomy in the 80s, barrette's esophagus and vertebral compression fracture. He developed a sudden onset of abdominal pain with vomiting. CT scan done in the ED showed intussusception of ileum. While in the hospital, he developed a new onset of A. fib with RVR which was managed with IV Cardizem and IV Lopressor. 2-D echo showed EF of 25% which dropped from 45% previously.  The small bowel obstruction was found to be due to be bezoar of terminal ileum with near total obstruction and underwent enterotomy with removal on 11/09/15. He had acute hypoxic respiratory failure and chest x-ray showed by basilar opacities with hypoexpanded lungs. He was treated with Unasyn 10 days.  Patient was admitted to this facility for short-term rehabilitation after the patient's recent hospitalization.  Patient has completed SNF rehabilitation and therapy has cleared the patient for discharge.   PAST MEDICAL HISTORY:  Past Medical History:  Diagnosis Date  . Aspiration pneumonia ([place])   . Atrial fibrillation with RVR ([place])   . BARRETTS ESOPHAGUS 11/19/2007  . Blood transfusion without reported diagnosis   . Cardiomyopathy ([place])   . CHANGE IN BOWELS 02/01/2009  . Chronic combined systolic (congestive) and diastolic (congestive) heart failure ([place])    a. echo 2015: EF 45% b. Echo 10/2015: EF 25-30% with diffuse HK  . CKD stage G2/A2, GFR 60 - 89 and albumin creatinine ratio 30 - 299 mg/g   . Compression fracture of L2 lumbar vertebra ([facility])   . Diarrhea 01/31/2009  . DIVERTICULOSIS, COLON 11/19/2007  . 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 GASTRITIS 11/19/2007  . GERD 11/19/2007  . HIATAL HERNIA 11/19/2007  . HOH (hard of hearing)   .  HTN (hypertension)   . HYPERLIPIDEMIA 06/27/2009  . Hypokalemia   . Hypomagnesemia 10/2015  . HYPOTHYROIDISM 06/27/2009  . IBS (irritable bowel syndrome)   . Intussusception ([place])   . MELENA 08/01/2009  . Nonischemic cardiomyopathy ([place])    a. NST in 2010 with no evidence of ischemia.  Marland Kitchen PERSONAL HX COLONIC POLYPS 02/01/2009  . Protein-calorie malnutrition, severe ([place])   . TINEA CORPORIS 06/27/2009  . TOBACCO USE, QUIT 06/27/2009  . TREMOR, ESSENTIAL 06/27/2009     CURRENT MEDICATIONS: Reviewed  Patient's Medications  New Prescriptions   No medications on file  Previous Medications   ACIDOPHILUS (RISAQUAD) CAPS CAPSULE    Take 1 capsule by mouth daily.     APIXABAN (ELIQUIS) 5 MG TABS TABLET    Take 1 tablet (5 mg total) by mouth 2 (two) times daily.   ESOMEPRAZOLE (NEXIUM) 40 MG CAPSULE    Take 40 mg by mouth daily before breakfast.   FUROSEMIDE (LASIX) 20 MG TABLET    Take 20 mg by mouth every other day.    FUROSEMIDE (LASIX) 40 MG TABLET    Take 40 mg by mouth every other day.    LISINOPRIL (PRINIVIL,ZESTRIL) 5 MG TABLET    Take 1 tablet (5 mg total) by mouth daily.   METOPROLOL SUCCINATE (TOPROL-XL) 25 MG 24 HR TABLET    Take 25 mg by mouth daily.    MULTIPLE VITAMINS-MINERALS (PRESERVISION AREDS 2) CAPS    Take 1 capsule by mouth 2 (two) times daily.    POTASSIUM CHLORIDE (K-DUR,KLOR-CON) 10 MEQ TABLET    Take 10 mEq by mouth daily.   PROTEIN (PROCEL) POWD    Take 2 scoop by mouth 2 (two) times daily.   SIMVASTATIN (ZOCOR) 20 MG TABLET    Take 20 mg by mouth at bedtime.    TAMSULOSIN (FLOMAX) 0.4 MG CAPS CAPSULE    Take 0.4 mg by mouth daily after supper.    TRAMADOL-ACETAMINOPHEN (ULTRACET) 37.5-325 MG TABLET    Take 1 tablet by mouth every 6 (six) hours as needed.   UNABLE TO FIND    Med Name: MedPass 120 mL po TID for nutritional support  Modified Medications   No medications on file  Discontinued Medications   No medications on file     Allergies  Allergen Reactions  . Ciprofloxacin Other (See Comments)    Reaction:  Unknown   . Penicillins Rash and Other (See Comments)    Tolerated Unasyn 11/03/15 Has patient had a PCN reaction causing immediate rash, facial/tongue/throat swelling, SOB or lightheadedness with hypotension: No Has patient had a PCN reaction causing severe rash involving mucus membranes or skin necrosis: No Has patient had a PCN reaction that required hospitalization No Has patient had a PCN reaction occurring within the last 10 years: No If all of the above answers are "NO", then may proceed with Cephalosporin use.     REVIEW OF SYSTEMS:  GENERAL: no change in appetite, no fatigue, no weight changes, no  fever, chills or weakness EYES: Denies change in vision, dry eyes, eye pain, itching or discharge EARS: Denies change in hearing, ringing in ears, or earache NOSE: Denies nasal congestion or epistaxis MOUTH and THROAT: Denies oral discomfort, gingival pain or bleeding, pain from teeth or hoarseness   RESPIRATORY: no cough, SOB, DOE, wheezing, hemoptysis CARDIAC: no chest pain, edema or palpitations GI: no abdominal pain, diarrhea, constipation, heart burn, nausea or vomiting GU: Denies dysuria, frequency, hematuria, incontinence, or discharge PSYCHIATRIC: Denies feeling of  depression or anxiety. No report of hallucinations, insomnia, paranoia, or agitation    PHYSICAL EXAMINATION  GENERAL APPEARANCE: Well nourished. In no acute distress. Normal body habitus SKIN:  Midline abdominal incision is healed HEAD: Normal in size and contour. No evidence of trauma EYES: Lids open and close normally. No blepharitis, entropion or ectropion. PERRL. Conjunctivae are clear and sclerae are white. Lenses are without opacity EARS: Pinnae are normal.Hard of hearing. MOUTH and THROAT: Lips are without lesions. Oral mucosa is moist and without lesions. Tongue is normal in shape, size, and color and without lesions NECK: supple, trachea midline, no neck masses, no thyroid tenderness, no thyromegaly LYMPHATICS: no LAN in the neck, no supraclavicular LAN RESPIRATORY: breathing is even & unlabored, BS CTAB CARDIAC: RRR, no murmur,no extra heart sounds, no edema GI: abdomen soft, normal BS, no masses, no tenderness, no hepatomegaly, no splenomegaly EXTREMITIES:  Able to move 4 extremities PSYCHIATRIC: Alert to place and person, disoriented to time.  Affect and behavior are appropriate  LABS/RADIOLOGY: Labs reviewed: 11/30/15   Wbc 7.1  hgb 11.6  hct 35.7  mcv 94.4  Platelet 241  Mg 1.6 Basic Metabolic Panel:  Recent Labs  11/10/15 0745 11/10/15 1352 11/11/15 0339  11/13/15 0345 11/14/15 0404  11/15/15 0604 11/16/15 0758 11/18/15 1229 12/05/15  NA 140  --  141  < > 143 143 142 142 141 140  K 4.4 4.3 3.6  < > 3.8 3.2* 3.6 3.4* 4.2 4.5  CL 117*  --  114*  < > 114* 111 109 105  --   --   CO2 18*  --  21*  < > 21* 27 28 32  --   --   GLUCOSE 144*  --  98  < > 92 95 121* 100*  --   --   BUN 22*  --  20  < > 14 14 12 10 13  33*  CREATININE 0.98  --  1.29*  < > 0.98 0.91 1.17 1.06 1.1 1.2  CALCIUM 7.3*  --  7.3*  < > 7.8* 7.6* 7.8* 7.9*  --   --   MG 1.6* 2.1 1.9  --  1.8  --   --   --   --   --   PHOS 3.1  --  3.4  --   --   --   --   --   --   --   < > = values in this interval not displayed. Liver Function Tests:  Recent Labs  03/16/15 0936 11/03/15 1430 11/11/15 0339  AST 11 66* 10*  ALT 9 42 12*  ALKPHOS 180* 137* 54  BILITOT 0.8 1.2 0.9  PROT 6.7 6.1* 4.3*  ALBUMIN 3.8 3.2* 1.7*    Recent Labs  11/03/15 1430  LIPASE 30   CBC:  Recent Labs  11/06/15 0323  11/11/15 0339  11/14/15 0838 11/15/15 0604 11/16/15 0758 11/18/15 1229  WBC 16.1*  < > 12.9*  < > 11.1* 12.0* 11.3* 11.0  NEUTROABS 14.4*  --  10.0*  --   --   --   --  8  HGB 12.3*  < > 10.5*  < > 11.5* 10.9* 11.0* 11.3*  HCT 37.0*  < > 30.5*  < > 34.4* 32.8* 33.6* 35*  MCV 91.4  < > 88.4  < > 90.1 91.1 89.4  --   PLT 229  < > 302  < > 405* 405* 382 353  < > = values in this  interval not displayed.  Lipid Panel:  Recent Labs  12/31/14 0952 06/30/15 0949  HDL 29* 42   Cardiac Enzymes:  Recent Labs  11/09/15 1803 11/10/15 0119 11/10/15 0425  TROPONINI 0.15* 0.10* 0.10*   CBG:  Recent Labs  11/15/15 2227 11/16/15 0729 11/16/15 1150  GLUCAP 95 78 103*      Dg Chest Port 1 View  Result Date: 11/10/2015 CLINICAL DATA:  Hypoxia EXAM: PORTABLE CHEST 1 VIEW COMPARISON:  November 09, 2015 FINDINGS: Endotracheal tube tip is 2.1 cm above the carina. Nasogastric tube tip and side port are in the stomach. No pneumothorax. There is a stable nodular appearing opacity in the right lower lobe,  likely a granuloma. There is patchy atelectasis in both lower lobes. Foci of calcification are noted along each pleural surface, more on the right than on the left, findings consistent with either previous empyema or asbestos exposure. There is no new opacity. Heart is mildly enlarged with pulmonary vascularity within normal limits. There is atherosclerotic calcification in the aorta. No adenopathy evident. There is degenerative change in each shoulder. IMPRESSION: Tube positions as described without pneumothorax. Bibasilar atelectasis. Areas of pleural calcification bilaterally, more on the right than on the left, stable. Probable granuloma right lower lobe. Aortic atherosclerosis. Stable cardiac prominence. No frank edema or consolidation evident. Electronically Signed   By: Lowella Grip III M.D.   On: 11/10/2015 08:42   Dg Chest Port 1 View  Result Date: 11/09/2015 CLINICAL DATA:  Intubation. EXAM: PORTABLE CHEST 1 VIEW COMPARISON:  11/08/2015 FINDINGS: Endotracheal tube has its tip 1 cm above the carina. Nasogastric tube enters the stomach. The heart is enlarged. There is aortic atherosclerosis. There is infiltrate and/or volume loss in the lower lungs bilaterally. Some of the pulmonary opacities are somewhat nodular and should be followed on subsequent exams. No effusions. IMPRESSION: Endotracheal tube tip 1 cm above the carina. Nasogastric tube enters stomach. Infiltrate and/or volume loss at both lung bases. Follow-up nodular shadows after treatment. Cardiomegaly.  Aortic atherosclerosis. Electronically Signed   By: Nelson Chimes M.D.   On: 11/09/2015 18:21    ASSESSMENT/PLAN:  Physical deconditioning - for Home health PT, OT, CNA and ST; for therapeutic and strengthening exercises; fall precaution  Bezoar of ileum S/P exploratory laparotomy - for Home health CNA, ST, PT and OT to have therapeutic and strengthening exercises; continue tramadol-acetaminophen 37.5-325 mg 1 tab by mouth every 6  hours when necessary for pain; follow-up with general surgery, Dr. [name]  Aspiration pneumonia - resolved  Atrial fibrillation with RVR - new onset; rate controlled; EF of 25%; recently started on Eliquis 5 mg 1 tab by mouth twice a day and metoprolol succinate 25 mg 24 hour 1 tab by mouth daily  Chronic systolic CHF - EF of [id number]; continue metoprolol succinate 25 mg 24 hour 1 tab by mouth daily, lisinopril 5 mg 1 tab by mouth daily and Lasix alternating 40 mg and 20 mg every other day  Hypokalemia - start KCL ER 10 meq 1 tab daily Lab Results  Component Value Date   K 4.5 12/05/2015   Hypertension - well controlled; continue lisinopril 5 mg 1 tab by mouth daily and metoprolol succinate 25 mg 24 hour 1 tab by mouth daily  Protein calorie malnutrition, severe - albumin 1.7 - continue Procel 2 scoops by mouth twice a day   Anemia, acute blood loss -  Stable Lab Results  Component Value Date   HGB 11.3 (A) 11/18/2015  GERD - continue Nexium 40 mg 1 capsule by mouth daily  Hypomagnesemia - mg level 1.6; start Magnesium 400 mg 1 tab PO Q D; MG level on 12/12/15       I have filled out patient's discharge paperwork and written prescriptions.  Patient will receive home health PT, OT, ST and CNA.  DME provided:  None  Total discharge time: Greater than 30 minutes Greater than 50% was spent in counseling and coordination of care with the patient.  Discharge time involved coordination of the discharge process with social worker, nursing staff and therapy department. Medical justification for home health services verified.    Durenda Age, NP [employer] [phone]

## 2015-12-09 NOTE — Progress Notes (Signed)
This encounter was created in error - please disregard.

## 2015-12-15 DIAGNOSIS — Z9049 Acquired absence of other specified parts of digestive tract: Secondary | ICD-10-CM | POA: Diagnosis not present

## 2015-12-15 DIAGNOSIS — H9190 Unspecified hearing loss, unspecified ear: Secondary | ICD-10-CM | POA: Diagnosis not present

## 2015-12-15 DIAGNOSIS — Z87311 Personal history of (healed) other pathological fracture: Secondary | ICD-10-CM | POA: Diagnosis not present

## 2015-12-15 DIAGNOSIS — N182 Chronic kidney disease, stage 2 (mild): Secondary | ICD-10-CM | POA: Diagnosis not present

## 2015-12-15 DIAGNOSIS — Z7901 Long term (current) use of anticoagulants: Secondary | ICD-10-CM | POA: Diagnosis not present

## 2015-12-15 DIAGNOSIS — Z48815 Encounter for surgical aftercare following surgery on the digestive system: Secondary | ICD-10-CM | POA: Diagnosis not present

## 2015-12-15 DIAGNOSIS — I4891 Unspecified atrial fibrillation: Secondary | ICD-10-CM | POA: Diagnosis not present

## 2015-12-15 DIAGNOSIS — I5042 Chronic combined systolic (congestive) and diastolic (congestive) heart failure: Secondary | ICD-10-CM | POA: Diagnosis not present

## 2015-12-15 DIAGNOSIS — I130 Hypertensive heart and chronic kidney disease with heart failure and stage 1 through stage 4 chronic kidney disease, or unspecified chronic kidney disease: Secondary | ICD-10-CM | POA: Diagnosis not present

## 2015-12-15 DIAGNOSIS — I429 Cardiomyopathy, unspecified: Secondary | ICD-10-CM | POA: Diagnosis not present

## 2015-12-16 DIAGNOSIS — H9190 Unspecified hearing loss, unspecified ear: Secondary | ICD-10-CM | POA: Diagnosis not present

## 2015-12-16 DIAGNOSIS — I5042 Chronic combined systolic (congestive) and diastolic (congestive) heart failure: Secondary | ICD-10-CM | POA: Diagnosis not present

## 2015-12-16 DIAGNOSIS — Z7901 Long term (current) use of anticoagulants: Secondary | ICD-10-CM | POA: Diagnosis not present

## 2015-12-16 DIAGNOSIS — I4891 Unspecified atrial fibrillation: Secondary | ICD-10-CM | POA: Diagnosis not present

## 2015-12-16 DIAGNOSIS — Z9049 Acquired absence of other specified parts of digestive tract: Secondary | ICD-10-CM | POA: Diagnosis not present

## 2015-12-16 DIAGNOSIS — N182 Chronic kidney disease, stage 2 (mild): Secondary | ICD-10-CM | POA: Diagnosis not present

## 2015-12-16 DIAGNOSIS — Z48815 Encounter for surgical aftercare following surgery on the digestive system: Secondary | ICD-10-CM | POA: Diagnosis not present

## 2015-12-16 DIAGNOSIS — Z87311 Personal history of (healed) other pathological fracture: Secondary | ICD-10-CM | POA: Diagnosis not present

## 2015-12-16 DIAGNOSIS — I130 Hypertensive heart and chronic kidney disease with heart failure and stage 1 through stage 4 chronic kidney disease, or unspecified chronic kidney disease: Secondary | ICD-10-CM | POA: Diagnosis not present

## 2015-12-16 DIAGNOSIS — I429 Cardiomyopathy, unspecified: Secondary | ICD-10-CM | POA: Diagnosis not present

## 2015-12-19 ENCOUNTER — Telehealth: Payer: Self-pay

## 2015-12-19 NOTE — Telephone Encounter (Signed)
One time for one week,  Two times a week for six weeks   Call physical therpist [phone]

## 2015-12-20 DIAGNOSIS — Z48815 Encounter for surgical aftercare following surgery on the digestive system: Secondary | ICD-10-CM | POA: Diagnosis not present

## 2015-12-20 DIAGNOSIS — Z7901 Long term (current) use of anticoagulants: Secondary | ICD-10-CM | POA: Diagnosis not present

## 2015-12-20 DIAGNOSIS — I130 Hypertensive heart and chronic kidney disease with heart failure and stage 1 through stage 4 chronic kidney disease, or unspecified chronic kidney disease: Secondary | ICD-10-CM | POA: Diagnosis not present

## 2015-12-20 DIAGNOSIS — I5042 Chronic combined systolic (congestive) and diastolic (congestive) heart failure: Secondary | ICD-10-CM | POA: Diagnosis not present

## 2015-12-20 DIAGNOSIS — I429 Cardiomyopathy, unspecified: Secondary | ICD-10-CM | POA: Diagnosis not present

## 2015-12-20 DIAGNOSIS — Z9049 Acquired absence of other specified parts of digestive tract: Secondary | ICD-10-CM | POA: Diagnosis not present

## 2015-12-20 DIAGNOSIS — I4891 Unspecified atrial fibrillation: Secondary | ICD-10-CM | POA: Diagnosis not present

## 2015-12-20 DIAGNOSIS — Z87311 Personal history of (healed) other pathological fracture: Secondary | ICD-10-CM | POA: Diagnosis not present

## 2015-12-20 DIAGNOSIS — N182 Chronic kidney disease, stage 2 (mild): Secondary | ICD-10-CM | POA: Diagnosis not present

## 2015-12-20 DIAGNOSIS — H9190 Unspecified hearing loss, unspecified ear: Secondary | ICD-10-CM | POA: Diagnosis not present

## 2015-12-20 NOTE — Telephone Encounter (Signed)
Please be sure we have approved all the physical therapy he needs for his deconditioning.

## 2015-12-20 NOTE — Telephone Encounter (Signed)
Has been in hospital for surgery on intestines, then [facility] for rehab, now he has physical and occupational therapy coming to house.

## 2015-12-21 DIAGNOSIS — H9190 Unspecified hearing loss, unspecified ear: Secondary | ICD-10-CM | POA: Diagnosis not present

## 2015-12-21 DIAGNOSIS — Z9049 Acquired absence of other specified parts of digestive tract: Secondary | ICD-10-CM | POA: Diagnosis not present

## 2015-12-21 DIAGNOSIS — N182 Chronic kidney disease, stage 2 (mild): Secondary | ICD-10-CM | POA: Diagnosis not present

## 2015-12-21 DIAGNOSIS — Z7901 Long term (current) use of anticoagulants: Secondary | ICD-10-CM | POA: Diagnosis not present

## 2015-12-21 DIAGNOSIS — I4891 Unspecified atrial fibrillation: Secondary | ICD-10-CM | POA: Diagnosis not present

## 2015-12-21 DIAGNOSIS — Z48815 Encounter for surgical aftercare following surgery on the digestive system: Secondary | ICD-10-CM | POA: Diagnosis not present

## 2015-12-21 DIAGNOSIS — Z87311 Personal history of (healed) other pathological fracture: Secondary | ICD-10-CM | POA: Diagnosis not present

## 2015-12-21 DIAGNOSIS — I5042 Chronic combined systolic (congestive) and diastolic (congestive) heart failure: Secondary | ICD-10-CM | POA: Diagnosis not present

## 2015-12-21 DIAGNOSIS — I429 Cardiomyopathy, unspecified: Secondary | ICD-10-CM | POA: Diagnosis not present

## 2015-12-21 DIAGNOSIS — I130 Hypertensive heart and chronic kidney disease with heart failure and stage 1 through stage 4 chronic kidney disease, or unspecified chronic kidney disease: Secondary | ICD-10-CM | POA: Diagnosis not present

## 2015-12-21 NOTE — Telephone Encounter (Signed)
YES!!! Please proceed with speech therapy 2x/week for 6 weeks!

## 2015-12-21 NOTE — Telephone Encounter (Signed)
Speech therapist calling b/c she needs verbal orders for speech therapy 2x a week for 6 weeks for this patient. Previously Daub's patient. Please advise.   States that you can leave VM if she misses the call. Thanks!   [phone]

## 2015-12-22 DIAGNOSIS — H9190 Unspecified hearing loss, unspecified ear: Secondary | ICD-10-CM | POA: Diagnosis not present

## 2015-12-22 DIAGNOSIS — Z48815 Encounter for surgical aftercare following surgery on the digestive system: Secondary | ICD-10-CM | POA: Diagnosis not present

## 2015-12-22 DIAGNOSIS — I5042 Chronic combined systolic (congestive) and diastolic (congestive) heart failure: Secondary | ICD-10-CM | POA: Diagnosis not present

## 2015-12-22 DIAGNOSIS — Z9049 Acquired absence of other specified parts of digestive tract: Secondary | ICD-10-CM | POA: Diagnosis not present

## 2015-12-22 DIAGNOSIS — I130 Hypertensive heart and chronic kidney disease with heart failure and stage 1 through stage 4 chronic kidney disease, or unspecified chronic kidney disease: Secondary | ICD-10-CM | POA: Diagnosis not present

## 2015-12-22 DIAGNOSIS — I4891 Unspecified atrial fibrillation: Secondary | ICD-10-CM | POA: Diagnosis not present

## 2015-12-22 DIAGNOSIS — Z7901 Long term (current) use of anticoagulants: Secondary | ICD-10-CM | POA: Diagnosis not present

## 2015-12-22 DIAGNOSIS — Z87311 Personal history of (healed) other pathological fracture: Secondary | ICD-10-CM | POA: Diagnosis not present

## 2015-12-22 DIAGNOSIS — N182 Chronic kidney disease, stage 2 (mild): Secondary | ICD-10-CM | POA: Diagnosis not present

## 2015-12-22 DIAGNOSIS — I429 Cardiomyopathy, unspecified: Secondary | ICD-10-CM | POA: Diagnosis not present

## 2015-12-23 DIAGNOSIS — Z9049 Acquired absence of other specified parts of digestive tract: Secondary | ICD-10-CM | POA: Diagnosis not present

## 2015-12-23 DIAGNOSIS — I4891 Unspecified atrial fibrillation: Secondary | ICD-10-CM | POA: Diagnosis not present

## 2015-12-23 DIAGNOSIS — N182 Chronic kidney disease, stage 2 (mild): Secondary | ICD-10-CM | POA: Diagnosis not present

## 2015-12-23 DIAGNOSIS — I429 Cardiomyopathy, unspecified: Secondary | ICD-10-CM | POA: Diagnosis not present

## 2015-12-23 DIAGNOSIS — Z48815 Encounter for surgical aftercare following surgery on the digestive system: Secondary | ICD-10-CM | POA: Diagnosis not present

## 2015-12-23 DIAGNOSIS — I130 Hypertensive heart and chronic kidney disease with heart failure and stage 1 through stage 4 chronic kidney disease, or unspecified chronic kidney disease: Secondary | ICD-10-CM | POA: Diagnosis not present

## 2015-12-23 DIAGNOSIS — H9190 Unspecified hearing loss, unspecified ear: Secondary | ICD-10-CM | POA: Diagnosis not present

## 2015-12-23 DIAGNOSIS — I5042 Chronic combined systolic (congestive) and diastolic (congestive) heart failure: Secondary | ICD-10-CM | POA: Diagnosis not present

## 2015-12-23 DIAGNOSIS — Z87311 Personal history of (healed) other pathological fracture: Secondary | ICD-10-CM | POA: Diagnosis not present

## 2015-12-23 DIAGNOSIS — Z7901 Long term (current) use of anticoagulants: Secondary | ICD-10-CM | POA: Diagnosis not present

## 2015-12-26 NOTE — Telephone Encounter (Signed)
erin advised of  Verbal speech therapy order and will see patient.

## 2015-12-27 DIAGNOSIS — I429 Cardiomyopathy, unspecified: Secondary | ICD-10-CM | POA: Diagnosis not present

## 2015-12-27 DIAGNOSIS — I5042 Chronic combined systolic (congestive) and diastolic (congestive) heart failure: Secondary | ICD-10-CM | POA: Diagnosis not present

## 2015-12-27 DIAGNOSIS — R531 Weakness: Secondary | ICD-10-CM | POA: Diagnosis not present

## 2015-12-27 DIAGNOSIS — Z48815 Encounter for surgical aftercare following surgery on the digestive system: Secondary | ICD-10-CM | POA: Diagnosis not present

## 2015-12-27 DIAGNOSIS — Z7901 Long term (current) use of anticoagulants: Secondary | ICD-10-CM | POA: Diagnosis not present

## 2015-12-27 DIAGNOSIS — I4891 Unspecified atrial fibrillation: Secondary | ICD-10-CM | POA: Diagnosis not present

## 2015-12-27 DIAGNOSIS — Z87311 Personal history of (healed) other pathological fracture: Secondary | ICD-10-CM | POA: Diagnosis not present

## 2015-12-27 DIAGNOSIS — Z9049 Acquired absence of other specified parts of digestive tract: Secondary | ICD-10-CM | POA: Diagnosis not present

## 2015-12-27 DIAGNOSIS — R42 Dizziness and giddiness: Secondary | ICD-10-CM | POA: Diagnosis not present

## 2015-12-27 DIAGNOSIS — I130 Hypertensive heart and chronic kidney disease with heart failure and stage 1 through stage 4 chronic kidney disease, or unspecified chronic kidney disease: Secondary | ICD-10-CM | POA: Diagnosis not present

## 2015-12-27 DIAGNOSIS — R404 Transient alteration of awareness: Secondary | ICD-10-CM | POA: Diagnosis not present

## 2015-12-27 DIAGNOSIS — R55 Syncope and collapse: Secondary | ICD-10-CM | POA: Diagnosis not present

## 2015-12-27 DIAGNOSIS — H9190 Unspecified hearing loss, unspecified ear: Secondary | ICD-10-CM | POA: Diagnosis not present

## 2015-12-27 DIAGNOSIS — N182 Chronic kidney disease, stage 2 (mild): Secondary | ICD-10-CM | POA: Diagnosis not present

## 2015-12-29 DIAGNOSIS — Z9049 Acquired absence of other specified parts of digestive tract: Secondary | ICD-10-CM | POA: Diagnosis not present

## 2015-12-29 DIAGNOSIS — H9190 Unspecified hearing loss, unspecified ear: Secondary | ICD-10-CM | POA: Diagnosis not present

## 2015-12-29 DIAGNOSIS — I5042 Chronic combined systolic (congestive) and diastolic (congestive) heart failure: Secondary | ICD-10-CM | POA: Diagnosis not present

## 2015-12-29 DIAGNOSIS — Z87311 Personal history of (healed) other pathological fracture: Secondary | ICD-10-CM | POA: Diagnosis not present

## 2015-12-29 DIAGNOSIS — I429 Cardiomyopathy, unspecified: Secondary | ICD-10-CM | POA: Diagnosis not present

## 2015-12-29 DIAGNOSIS — I130 Hypertensive heart and chronic kidney disease with heart failure and stage 1 through stage 4 chronic kidney disease, or unspecified chronic kidney disease: Secondary | ICD-10-CM | POA: Diagnosis not present

## 2015-12-29 DIAGNOSIS — N182 Chronic kidney disease, stage 2 (mild): Secondary | ICD-10-CM | POA: Diagnosis not present

## 2015-12-29 DIAGNOSIS — Z48815 Encounter for surgical aftercare following surgery on the digestive system: Secondary | ICD-10-CM | POA: Diagnosis not present

## 2015-12-29 DIAGNOSIS — Z7901 Long term (current) use of anticoagulants: Secondary | ICD-10-CM | POA: Diagnosis not present

## 2015-12-29 DIAGNOSIS — I4891 Unspecified atrial fibrillation: Secondary | ICD-10-CM | POA: Diagnosis not present

## 2016-01-03 DIAGNOSIS — Z7901 Long term (current) use of anticoagulants: Secondary | ICD-10-CM | POA: Diagnosis not present

## 2016-01-03 DIAGNOSIS — I429 Cardiomyopathy, unspecified: Secondary | ICD-10-CM | POA: Diagnosis not present

## 2016-01-03 DIAGNOSIS — N182 Chronic kidney disease, stage 2 (mild): Secondary | ICD-10-CM | POA: Diagnosis not present

## 2016-01-03 DIAGNOSIS — Z9049 Acquired absence of other specified parts of digestive tract: Secondary | ICD-10-CM | POA: Diagnosis not present

## 2016-01-03 DIAGNOSIS — Z48815 Encounter for surgical aftercare following surgery on the digestive system: Secondary | ICD-10-CM | POA: Diagnosis not present

## 2016-01-03 DIAGNOSIS — H9190 Unspecified hearing loss, unspecified ear: Secondary | ICD-10-CM | POA: Diagnosis not present

## 2016-01-03 DIAGNOSIS — Z87311 Personal history of (healed) other pathological fracture: Secondary | ICD-10-CM | POA: Diagnosis not present

## 2016-01-03 DIAGNOSIS — I5042 Chronic combined systolic (congestive) and diastolic (congestive) heart failure: Secondary | ICD-10-CM | POA: Diagnosis not present

## 2016-01-03 DIAGNOSIS — I4891 Unspecified atrial fibrillation: Secondary | ICD-10-CM | POA: Diagnosis not present

## 2016-01-03 DIAGNOSIS — I130 Hypertensive heart and chronic kidney disease with heart failure and stage 1 through stage 4 chronic kidney disease, or unspecified chronic kidney disease: Secondary | ICD-10-CM | POA: Diagnosis not present

## 2016-01-05 DIAGNOSIS — I429 Cardiomyopathy, unspecified: Secondary | ICD-10-CM | POA: Diagnosis not present

## 2016-01-05 DIAGNOSIS — I130 Hypertensive heart and chronic kidney disease with heart failure and stage 1 through stage 4 chronic kidney disease, or unspecified chronic kidney disease: Secondary | ICD-10-CM | POA: Diagnosis not present

## 2016-01-05 DIAGNOSIS — Z7901 Long term (current) use of anticoagulants: Secondary | ICD-10-CM | POA: Diagnosis not present

## 2016-01-05 DIAGNOSIS — Z9049 Acquired absence of other specified parts of digestive tract: Secondary | ICD-10-CM | POA: Diagnosis not present

## 2016-01-05 DIAGNOSIS — I4891 Unspecified atrial fibrillation: Secondary | ICD-10-CM | POA: Diagnosis not present

## 2016-01-05 DIAGNOSIS — Z48815 Encounter for surgical aftercare following surgery on the digestive system: Secondary | ICD-10-CM | POA: Diagnosis not present

## 2016-01-05 DIAGNOSIS — Z87311 Personal history of (healed) other pathological fracture: Secondary | ICD-10-CM | POA: Diagnosis not present

## 2016-01-05 DIAGNOSIS — H9190 Unspecified hearing loss, unspecified ear: Secondary | ICD-10-CM | POA: Diagnosis not present

## 2016-01-05 DIAGNOSIS — I5042 Chronic combined systolic (congestive) and diastolic (congestive) heart failure: Secondary | ICD-10-CM | POA: Diagnosis not present

## 2016-01-05 DIAGNOSIS — N182 Chronic kidney disease, stage 2 (mild): Secondary | ICD-10-CM | POA: Diagnosis not present

## 2016-01-10 DIAGNOSIS — I130 Hypertensive heart and chronic kidney disease with heart failure and stage 1 through stage 4 chronic kidney disease, or unspecified chronic kidney disease: Secondary | ICD-10-CM | POA: Diagnosis not present

## 2016-01-10 DIAGNOSIS — Z9049 Acquired absence of other specified parts of digestive tract: Secondary | ICD-10-CM | POA: Diagnosis not present

## 2016-01-10 DIAGNOSIS — H9190 Unspecified hearing loss, unspecified ear: Secondary | ICD-10-CM | POA: Diagnosis not present

## 2016-01-10 DIAGNOSIS — I429 Cardiomyopathy, unspecified: Secondary | ICD-10-CM | POA: Diagnosis not present

## 2016-01-10 DIAGNOSIS — Z87311 Personal history of (healed) other pathological fracture: Secondary | ICD-10-CM | POA: Diagnosis not present

## 2016-01-10 DIAGNOSIS — Z48815 Encounter for surgical aftercare following surgery on the digestive system: Secondary | ICD-10-CM | POA: Diagnosis not present

## 2016-01-10 DIAGNOSIS — Z7901 Long term (current) use of anticoagulants: Secondary | ICD-10-CM | POA: Diagnosis not present

## 2016-01-10 DIAGNOSIS — I4891 Unspecified atrial fibrillation: Secondary | ICD-10-CM | POA: Diagnosis not present

## 2016-01-10 DIAGNOSIS — N182 Chronic kidney disease, stage 2 (mild): Secondary | ICD-10-CM | POA: Diagnosis not present

## 2016-01-10 DIAGNOSIS — I5042 Chronic combined systolic (congestive) and diastolic (congestive) heart failure: Secondary | ICD-10-CM | POA: Diagnosis not present

## 2016-01-12 ENCOUNTER — Other Ambulatory Visit: Payer: Self-pay | Admitting: Internal Medicine

## 2016-01-12 DIAGNOSIS — Z7901 Long term (current) use of anticoagulants: Secondary | ICD-10-CM | POA: Diagnosis not present

## 2016-01-12 DIAGNOSIS — I5042 Chronic combined systolic (congestive) and diastolic (congestive) heart failure: Secondary | ICD-10-CM | POA: Diagnosis not present

## 2016-01-12 DIAGNOSIS — I130 Hypertensive heart and chronic kidney disease with heart failure and stage 1 through stage 4 chronic kidney disease, or unspecified chronic kidney disease: Secondary | ICD-10-CM | POA: Diagnosis not present

## 2016-01-12 DIAGNOSIS — I429 Cardiomyopathy, unspecified: Secondary | ICD-10-CM | POA: Diagnosis not present

## 2016-01-12 DIAGNOSIS — Z48815 Encounter for surgical aftercare following surgery on the digestive system: Secondary | ICD-10-CM | POA: Diagnosis not present

## 2016-01-12 DIAGNOSIS — Z9049 Acquired absence of other specified parts of digestive tract: Secondary | ICD-10-CM | POA: Diagnosis not present

## 2016-01-12 DIAGNOSIS — H9190 Unspecified hearing loss, unspecified ear: Secondary | ICD-10-CM | POA: Diagnosis not present

## 2016-01-12 DIAGNOSIS — N182 Chronic kidney disease, stage 2 (mild): Secondary | ICD-10-CM | POA: Diagnosis not present

## 2016-01-12 DIAGNOSIS — I4891 Unspecified atrial fibrillation: Secondary | ICD-10-CM | POA: Diagnosis not present

## 2016-01-12 DIAGNOSIS — Z87311 Personal history of (healed) other pathological fracture: Secondary | ICD-10-CM | POA: Diagnosis not present

## 2016-01-17 DIAGNOSIS — H9190 Unspecified hearing loss, unspecified ear: Secondary | ICD-10-CM | POA: Diagnosis not present

## 2016-01-17 DIAGNOSIS — I5042 Chronic combined systolic (congestive) and diastolic (congestive) heart failure: Secondary | ICD-10-CM | POA: Diagnosis not present

## 2016-01-17 DIAGNOSIS — I130 Hypertensive heart and chronic kidney disease with heart failure and stage 1 through stage 4 chronic kidney disease, or unspecified chronic kidney disease: Secondary | ICD-10-CM | POA: Diagnosis not present

## 2016-01-17 DIAGNOSIS — I4891 Unspecified atrial fibrillation: Secondary | ICD-10-CM | POA: Diagnosis not present

## 2016-01-17 DIAGNOSIS — I429 Cardiomyopathy, unspecified: Secondary | ICD-10-CM | POA: Diagnosis not present

## 2016-01-17 DIAGNOSIS — Z48815 Encounter for surgical aftercare following surgery on the digestive system: Secondary | ICD-10-CM | POA: Diagnosis not present

## 2016-01-17 DIAGNOSIS — Z7901 Long term (current) use of anticoagulants: Secondary | ICD-10-CM | POA: Diagnosis not present

## 2016-01-17 DIAGNOSIS — N182 Chronic kidney disease, stage 2 (mild): Secondary | ICD-10-CM | POA: Diagnosis not present

## 2016-01-17 DIAGNOSIS — Z9049 Acquired absence of other specified parts of digestive tract: Secondary | ICD-10-CM | POA: Diagnosis not present

## 2016-01-17 DIAGNOSIS — Z87311 Personal history of (healed) other pathological fracture: Secondary | ICD-10-CM | POA: Diagnosis not present

## 2016-01-19 DIAGNOSIS — I130 Hypertensive heart and chronic kidney disease with heart failure and stage 1 through stage 4 chronic kidney disease, or unspecified chronic kidney disease: Secondary | ICD-10-CM | POA: Diagnosis not present

## 2016-01-19 DIAGNOSIS — Z87311 Personal history of (healed) other pathological fracture: Secondary | ICD-10-CM | POA: Diagnosis not present

## 2016-01-19 DIAGNOSIS — Z9049 Acquired absence of other specified parts of digestive tract: Secondary | ICD-10-CM | POA: Diagnosis not present

## 2016-01-19 DIAGNOSIS — I4891 Unspecified atrial fibrillation: Secondary | ICD-10-CM | POA: Diagnosis not present

## 2016-01-19 DIAGNOSIS — Z48815 Encounter for surgical aftercare following surgery on the digestive system: Secondary | ICD-10-CM | POA: Diagnosis not present

## 2016-01-19 DIAGNOSIS — I5042 Chronic combined systolic (congestive) and diastolic (congestive) heart failure: Secondary | ICD-10-CM | POA: Diagnosis not present

## 2016-01-19 DIAGNOSIS — I429 Cardiomyopathy, unspecified: Secondary | ICD-10-CM | POA: Diagnosis not present

## 2016-01-19 DIAGNOSIS — N182 Chronic kidney disease, stage 2 (mild): Secondary | ICD-10-CM | POA: Diagnosis not present

## 2016-01-19 DIAGNOSIS — Z7901 Long term (current) use of anticoagulants: Secondary | ICD-10-CM | POA: Diagnosis not present

## 2016-01-19 DIAGNOSIS — H9190 Unspecified hearing loss, unspecified ear: Secondary | ICD-10-CM | POA: Diagnosis not present

## 2016-01-24 DIAGNOSIS — I429 Cardiomyopathy, unspecified: Secondary | ICD-10-CM | POA: Diagnosis not present

## 2016-01-24 DIAGNOSIS — I4891 Unspecified atrial fibrillation: Secondary | ICD-10-CM | POA: Diagnosis not present

## 2016-01-24 DIAGNOSIS — Z48815 Encounter for surgical aftercare following surgery on the digestive system: Secondary | ICD-10-CM | POA: Diagnosis not present

## 2016-01-24 DIAGNOSIS — N182 Chronic kidney disease, stage 2 (mild): Secondary | ICD-10-CM | POA: Diagnosis not present

## 2016-01-24 DIAGNOSIS — I130 Hypertensive heart and chronic kidney disease with heart failure and stage 1 through stage 4 chronic kidney disease, or unspecified chronic kidney disease: Secondary | ICD-10-CM | POA: Diagnosis not present

## 2016-01-24 DIAGNOSIS — Z9049 Acquired absence of other specified parts of digestive tract: Secondary | ICD-10-CM | POA: Diagnosis not present

## 2016-01-24 DIAGNOSIS — Z7901 Long term (current) use of anticoagulants: Secondary | ICD-10-CM | POA: Diagnosis not present

## 2016-01-24 DIAGNOSIS — H9190 Unspecified hearing loss, unspecified ear: Secondary | ICD-10-CM | POA: Diagnosis not present

## 2016-01-24 DIAGNOSIS — I5042 Chronic combined systolic (congestive) and diastolic (congestive) heart failure: Secondary | ICD-10-CM | POA: Diagnosis not present

## 2016-01-24 DIAGNOSIS — Z87311 Personal history of (healed) other pathological fracture: Secondary | ICD-10-CM | POA: Diagnosis not present

## 2016-01-26 DIAGNOSIS — H9190 Unspecified hearing loss, unspecified ear: Secondary | ICD-10-CM | POA: Diagnosis not present

## 2016-01-26 DIAGNOSIS — Z48815 Encounter for surgical aftercare following surgery on the digestive system: Secondary | ICD-10-CM | POA: Diagnosis not present

## 2016-01-26 DIAGNOSIS — Z87311 Personal history of (healed) other pathological fracture: Secondary | ICD-10-CM | POA: Diagnosis not present

## 2016-01-26 DIAGNOSIS — Z9049 Acquired absence of other specified parts of digestive tract: Secondary | ICD-10-CM | POA: Diagnosis not present

## 2016-01-26 DIAGNOSIS — N182 Chronic kidney disease, stage 2 (mild): Secondary | ICD-10-CM | POA: Diagnosis not present

## 2016-01-26 DIAGNOSIS — I130 Hypertensive heart and chronic kidney disease with heart failure and stage 1 through stage 4 chronic kidney disease, or unspecified chronic kidney disease: Secondary | ICD-10-CM | POA: Diagnosis not present

## 2016-01-26 DIAGNOSIS — I4891 Unspecified atrial fibrillation: Secondary | ICD-10-CM | POA: Diagnosis not present

## 2016-01-26 DIAGNOSIS — I429 Cardiomyopathy, unspecified: Secondary | ICD-10-CM | POA: Diagnosis not present

## 2016-01-26 DIAGNOSIS — I5042 Chronic combined systolic (congestive) and diastolic (congestive) heart failure: Secondary | ICD-10-CM | POA: Diagnosis not present

## 2016-01-26 DIAGNOSIS — Z7901 Long term (current) use of anticoagulants: Secondary | ICD-10-CM | POA: Diagnosis not present

## 2016-01-30 ENCOUNTER — Ambulatory Visit (INDEPENDENT_AMBULATORY_CARE_PROVIDER_SITE_OTHER): Payer: Medicare Other | Admitting: Family Medicine

## 2016-01-30 DIAGNOSIS — Z23 Encounter for immunization: Secondary | ICD-10-CM | POA: Diagnosis not present

## 2016-03-01 ENCOUNTER — Other Ambulatory Visit: Payer: Self-pay | Admitting: Internal Medicine

## 2016-03-27 ENCOUNTER — Emergency Department (HOSPITAL_COMMUNITY): Payer: Medicare Other

## 2016-03-27 ENCOUNTER — Encounter (HOSPITAL_COMMUNITY): Payer: Self-pay | Admitting: *Deleted

## 2016-03-27 ENCOUNTER — Inpatient Hospital Stay (HOSPITAL_COMMUNITY)
Admission: EM | Admit: 2016-03-27 | Discharge: 2016-03-30 | DRG: 480 | Disposition: A | Discharge status: Skilled Nursing Facility | Payer: Medicare Other | Attending: Internal Medicine | Admitting: Internal Medicine

## 2016-03-27 DIAGNOSIS — R2689 Other abnormalities of gait and mobility: Secondary | ICD-10-CM | POA: Diagnosis not present

## 2016-03-27 DIAGNOSIS — E43 Unspecified severe protein-calorie malnutrition: Secondary | ICD-10-CM | POA: Diagnosis present

## 2016-03-27 DIAGNOSIS — S72001A Fracture of unspecified part of neck of right femur, initial encounter for closed fracture: Secondary | ICD-10-CM | POA: Diagnosis present

## 2016-03-27 DIAGNOSIS — M6281 Muscle weakness (generalized): Secondary | ICD-10-CM | POA: Diagnosis not present

## 2016-03-27 DIAGNOSIS — N183 Chronic kidney disease, stage 3 (moderate): Secondary | ICD-10-CM | POA: Diagnosis present

## 2016-03-27 DIAGNOSIS — S72141A Displaced intertrochanteric fracture of right femur, initial encounter for closed fracture: Secondary | ICD-10-CM | POA: Diagnosis not present

## 2016-03-27 DIAGNOSIS — Z881 Allergy status to other antibiotic agents status: Secondary | ICD-10-CM

## 2016-03-27 DIAGNOSIS — Z7901 Long term (current) use of anticoagulants: Secondary | ICD-10-CM

## 2016-03-27 DIAGNOSIS — T148XXA Other injury of unspecified body region, initial encounter: Secondary | ICD-10-CM

## 2016-03-27 DIAGNOSIS — E785 Hyperlipidemia, unspecified: Secondary | ICD-10-CM | POA: Diagnosis not present

## 2016-03-27 DIAGNOSIS — N400 Enlarged prostate without lower urinary tract symptoms: Secondary | ICD-10-CM | POA: Diagnosis present

## 2016-03-27 DIAGNOSIS — I130 Hypertensive heart and chronic kidney disease with heart failure and stage 1 through stage 4 chronic kidney disease, or unspecified chronic kidney disease: Secondary | ICD-10-CM | POA: Diagnosis not present

## 2016-03-27 DIAGNOSIS — Z880 Allergy status to penicillin: Secondary | ICD-10-CM | POA: Diagnosis not present

## 2016-03-27 DIAGNOSIS — Z96642 Presence of left artificial hip joint: Secondary | ICD-10-CM | POA: Diagnosis not present

## 2016-03-27 DIAGNOSIS — N179 Acute kidney failure, unspecified: Secondary | ICD-10-CM | POA: Diagnosis not present

## 2016-03-27 DIAGNOSIS — Z800 Family history of malignant neoplasm of digestive organs: Secondary | ICD-10-CM | POA: Diagnosis not present

## 2016-03-27 DIAGNOSIS — Z87891 Personal history of nicotine dependence: Secondary | ICD-10-CM | POA: Diagnosis not present

## 2016-03-27 DIAGNOSIS — Z4789 Encounter for other orthopedic aftercare: Secondary | ICD-10-CM | POA: Diagnosis not present

## 2016-03-27 DIAGNOSIS — N182 Chronic kidney disease, stage 2 (mild): Secondary | ICD-10-CM | POA: Diagnosis present

## 2016-03-27 DIAGNOSIS — M25451 Effusion, right hip: Secondary | ICD-10-CM | POA: Diagnosis not present

## 2016-03-27 DIAGNOSIS — I5023 Acute on chronic systolic (congestive) heart failure: Secondary | ICD-10-CM | POA: Diagnosis not present

## 2016-03-27 DIAGNOSIS — H9190 Unspecified hearing loss, unspecified ear: Secondary | ICD-10-CM | POA: Diagnosis not present

## 2016-03-27 DIAGNOSIS — Z8042 Family history of malignant neoplasm of prostate: Secondary | ICD-10-CM

## 2016-03-27 DIAGNOSIS — W1830XA Fall on same level, unspecified, initial encounter: Secondary | ICD-10-CM | POA: Diagnosis present

## 2016-03-27 DIAGNOSIS — S72091S Other fracture of head and neck of right femur, sequela: Secondary | ICD-10-CM | POA: Diagnosis not present

## 2016-03-27 DIAGNOSIS — Z9181 History of falling: Secondary | ICD-10-CM | POA: Diagnosis not present

## 2016-03-27 DIAGNOSIS — W19XXXA Unspecified fall, initial encounter: Secondary | ICD-10-CM | POA: Diagnosis present

## 2016-03-27 DIAGNOSIS — K219 Gastro-esophageal reflux disease without esophagitis: Secondary | ICD-10-CM | POA: Diagnosis present

## 2016-03-27 DIAGNOSIS — M25551 Pain in right hip: Secondary | ICD-10-CM | POA: Diagnosis not present

## 2016-03-27 DIAGNOSIS — I482 Chronic atrial fibrillation: Secondary | ICD-10-CM | POA: Diagnosis not present

## 2016-03-27 DIAGNOSIS — Z8249 Family history of ischemic heart disease and other diseases of the circulatory system: Secondary | ICD-10-CM | POA: Diagnosis not present

## 2016-03-27 DIAGNOSIS — S72011A Unspecified intracapsular fracture of right femur, initial encounter for closed fracture: Secondary | ICD-10-CM | POA: Diagnosis not present

## 2016-03-27 DIAGNOSIS — I480 Paroxysmal atrial fibrillation: Secondary | ICD-10-CM | POA: Diagnosis not present

## 2016-03-27 DIAGNOSIS — Z9049 Acquired absence of other specified parts of digestive tract: Secondary | ICD-10-CM | POA: Diagnosis not present

## 2016-03-27 DIAGNOSIS — Y92009 Unspecified place in unspecified non-institutional (private) residence as the place of occurrence of the external cause: Secondary | ICD-10-CM | POA: Diagnosis not present

## 2016-03-27 DIAGNOSIS — I10 Essential (primary) hypertension: Secondary | ICD-10-CM | POA: Diagnosis not present

## 2016-03-27 DIAGNOSIS — R259 Unspecified abnormal involuntary movements: Secondary | ICD-10-CM | POA: Diagnosis not present

## 2016-03-27 DIAGNOSIS — Z86010 Personal history of colonic polyps: Secondary | ICD-10-CM

## 2016-03-27 DIAGNOSIS — I429 Cardiomyopathy, unspecified: Secondary | ICD-10-CM | POA: Diagnosis not present

## 2016-03-27 DIAGNOSIS — S72009A Fracture of unspecified part of neck of unspecified femur, initial encounter for closed fracture: Secondary | ICD-10-CM | POA: Diagnosis not present

## 2016-03-27 DIAGNOSIS — S72091D Other fracture of head and neck of right femur, subsequent encounter for closed fracture with routine healing: Secondary | ICD-10-CM | POA: Diagnosis not present

## 2016-03-27 DIAGNOSIS — R278 Other lack of coordination: Secondary | ICD-10-CM | POA: Diagnosis not present

## 2016-03-27 DIAGNOSIS — I5043 Acute on chronic combined systolic (congestive) and diastolic (congestive) heart failure: Secondary | ICD-10-CM | POA: Diagnosis not present

## 2016-03-27 DIAGNOSIS — R079 Chest pain, unspecified: Secondary | ICD-10-CM | POA: Diagnosis not present

## 2016-03-27 LAB — BASIC METABOLIC PANEL
Anion gap: 8 (ref 5–15)
BUN: 35 mg/dL — AB (ref 6–20)
CHLORIDE: 106 mmol/L (ref 101–111)
CO2: 28 mmol/L (ref 22–32)
CREATININE: 1.13 mg/dL (ref 0.61–1.24)
Calcium: 8.8 mg/dL — ABNORMAL LOW (ref 8.9–10.3)
GFR, EST NON AFRICAN AMERICAN: 54 mL/min — AB (ref >60)
Glucose, Bld: 124 mg/dL — ABNORMAL HIGH (ref 65–99)
POTASSIUM: 4.5 mmol/L (ref 3.5–5.1)
SODIUM: 142 mmol/L (ref 135–145)

## 2016-03-27 LAB — TYPE AND SCREEN
ABO/RH(D): A POS
Antibody Screen: NEGATIVE

## 2016-03-27 LAB — CBC
HCT: 37.7 % — ABNORMAL LOW (ref 39.0–52.0)
HEMOGLOBIN: 12.5 g/dL — AB (ref 13.0–17.0)
MCH: 29.5 pg (ref 26.0–34.0)
MCHC: 33.2 g/dL (ref 30.0–36.0)
MCV: 88.9 fL (ref 78.0–100.0)
PLATELETS: 194 10*3/uL (ref 150–400)
RBC: 4.24 MIL/uL (ref 4.22–5.81)
RDW: 13.5 % (ref 11.5–15.5)
WBC: 12.9 10*3/uL — ABNORMAL HIGH (ref 4.0–10.5)

## 2016-03-27 LAB — PROTIME-INR
INR: 0.98
PROTHROMBIN TIME: 13 s (ref 11.4–15.2)

## 2016-03-27 MED ORDER — PROSIGHT PO TABS
1.0000 | ORAL_TABLET | Freq: Two times a day (BID) | ORAL | Status: DC
  Administered 2016-03-28 – 2016-03-30 (×5): 1 via ORAL
  Filled 2016-03-27 (×5): qty 1

## 2016-03-27 MED ORDER — MORPHINE SULFATE (PF) 2 MG/ML IV SOLN
2.0000 mg | Freq: Once | INTRAVENOUS | Status: AC
  Administered 2016-03-27: 2 mg via INTRAVENOUS
  Filled 2016-03-27: qty 1

## 2016-03-27 MED ORDER — HYDROCODONE-ACETAMINOPHEN 5-325 MG PO TABS: 1.0000 | ORAL_TABLET | Freq: Four times a day (QID) | ORAL | Status: DC | PRN

## 2016-03-27 MED ORDER — CEFAZOLIN SODIUM-DEXTROSE 2-4 GM/100ML-% IV SOLN
2.0000 g | INTRAVENOUS | Status: DC
Start: 2016-03-28 — End: 2016-03-29

## 2016-03-27 MED ORDER — TAMSULOSIN HCL 0.4 MG PO CAPS
0.4000 mg | ORAL_CAPSULE | Freq: Every day | ORAL | Status: DC
  Administered 2016-03-28: 0.4 mg via ORAL
  Filled 2016-03-27: qty 1

## 2016-03-27 MED ORDER — POTASSIUM CHLORIDE CRYS ER 10 MEQ PO TBCR
10.0000 meq | EXTENDED_RELEASE_TABLET | Freq: Every day | ORAL | Status: DC
  Administered 2016-03-28 – 2016-03-30 (×3): 10 meq via ORAL
  Filled 2016-03-27 (×3): qty 1

## 2016-03-27 MED ORDER — LISINOPRIL 10 MG PO TABS
10.0000 mg | ORAL_TABLET | Freq: Every day | ORAL | Status: DC
  Administered 2016-03-28: 10 mg via ORAL
  Filled 2016-03-27: qty 1

## 2016-03-27 MED ORDER — RISAQUAD PO CAPS
1.0000 | ORAL_CAPSULE | Freq: Every day | ORAL | Status: DC
  Administered 2016-03-28 – 2016-03-30 (×3): 1 via ORAL
  Filled 2016-03-27 (×3): qty 1

## 2016-03-27 MED ORDER — METOPROLOL SUCCINATE ER 25 MG PO TB24
25.0000 mg | ORAL_TABLET | Freq: Every day | ORAL | Status: DC
  Administered 2016-03-28 – 2016-03-30 (×3): 25 mg via ORAL
  Filled 2016-03-27 (×3): qty 1

## 2016-03-27 MED ORDER — MORPHINE SULFATE (PF) 2 MG/ML IV SOLN
1.0000 mg | INTRAVENOUS | Status: DC | PRN
  Administered 2016-03-29: 2 mg via INTRAVENOUS
  Filled 2016-03-27: qty 1

## 2016-03-27 MED ORDER — FUROSEMIDE 10 MG/ML IJ SOLN
40.0000 mg | Freq: Two times a day (BID) | INTRAMUSCULAR | Status: DC
  Administered 2016-03-28: 40 mg via INTRAVENOUS
  Filled 2016-03-27: qty 4

## 2016-03-27 MED ORDER — PANTOPRAZOLE SODIUM 40 MG PO TBEC
40.0000 mg | DELAYED_RELEASE_TABLET | Freq: Every day | ORAL | Status: DC
  Administered 2016-03-28 – 2016-03-30 (×3): 40 mg via ORAL
  Filled 2016-03-27 (×3): qty 1

## 2016-03-27 MED ORDER — LISINOPRIL-HYDROCHLOROTHIAZIDE 10-12.5 MG PO TABS: 1.0000 | ORAL_TABLET | Freq: Every day | ORAL | Status: DC

## 2016-03-27 MED ORDER — HYDROCHLOROTHIAZIDE 12.5 MG PO CAPS
12.5000 mg | ORAL_CAPSULE | Freq: Every day | ORAL | Status: DC
  Administered 2016-03-28: 12.5 mg via ORAL
  Filled 2016-03-27: qty 1

## 2016-03-27 MED ORDER — POVIDONE-IODINE 10 % EX SWAB
2.0000 "application " | Freq: Once | CUTANEOUS | Status: AC
  Administered 2016-03-28: 2 via TOPICAL

## 2016-03-27 MED ORDER — CHLORHEXIDINE GLUCONATE 4 % EX LIQD
60.0000 mL | Freq: Once | CUTANEOUS | Status: AC
  Administered 2016-03-28: 4 via TOPICAL
  Filled 2016-03-27: qty 60

## 2016-03-27 MED ORDER — BISACODYL 10 MG RE SUPP: 10.0000 mg | Freq: Every day | RECTAL | Status: DC | PRN

## 2016-03-27 MED ORDER — DOCUSATE SODIUM 100 MG PO CAPS
100.0000 mg | ORAL_CAPSULE | Freq: Two times a day (BID) | ORAL | Status: DC
  Administered 2016-03-28 – 2016-03-30 (×5): 100 mg via ORAL
  Filled 2016-03-27 (×5): qty 1

## 2016-03-27 MED ORDER — SIMVASTATIN 20 MG PO TABS
20.0000 mg | ORAL_TABLET | Freq: Every day | ORAL | Status: DC
  Administered 2016-03-28 – 2016-03-29 (×3): 20 mg via ORAL
  Filled 2016-03-27 (×3): qty 1

## 2016-03-27 NOTE — ED Triage Notes (Signed)
Per EMS, pt is coming from home. Around 1300 this afternoon pt had a fall and son found hip laying on his right side. Pt c/o right hip pain and EMS reports that after applying a pelvic binder pt had some relief. Pt is AO x4. Pt denies hitting head or losing consciousness. Pt is HOH.

## 2016-03-27 NOTE — ED Notes (Signed)
Pt was sating 85-90% on room air, Placed patient on 4 lpm of oxygen sat increased to 95%

## 2016-03-27 NOTE — H&P (Addendum)
[facility] History and Physical  [name] JXB:[id number] DOB: October 26, 1921 DOA: 03/27/2016  Referring physician: Dr Venora Maples PCP: Jenny Reichmann, MD   Chief Complaint: Fall and hip fracture  HPI: [name] is a 83 y.o. male with history of HTN, afib, BPH, combined CHF who fell this afternoon at home trying to "walk around without my walking stick".  Son found him lying on his R side. In ED xrays show a R subcapital hip fracture.  Is on Eliquis.  ED MD spoke with orthopedics on call, they are planning for possible surgical repair late tomorrow afternoon (Dr Stann Mainland).    In ED O2 sats dropped into 80's, put on nasal cannula and up into the 90's.  CXR showing new IS edema, old pleural plaques.    Patient is from [place], moved here with his wife 40 yrs ago to get out of the cold. 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   His son Is 82 yrs old. Past heavy smoker 3 ppd , quit many yrs ago.   Drinks 1 beer w dinner each night.  , and one martini a week.  Old chart: Apr '15 - septic shock/ PNA, rx'd with abx IV. HTN, a/c renal, CAP, PCM, afib and BPH Sept '15 - fall with L hip fracture, sp L hip hemiarthroplasty w/o complications. A/C renal failure.NoCHF complications, diureticswere held.  HTN.  parox afib. Not on AC due to high fall risk.  Aug '17 - bezoar with ileum sp exlap and evacuation, HTN, CKD2, severe PCM, afib/RVR,combined CHF, asp PNA,acute resp failure, CM, HOH  ROS  denies CP  no joint pain   no HA  no blurry vision  no rash  no diarrhea  no nausea/ vomiting  no dysuria  no difficulty voiding  no change in urine color    Past Medical History  Past Medical History:  Diagnosis Date  . Aspiration pneumonia ([place])   . Atrial fibrillation with RVR ([place])   . BARRETTS ESOPHAGUS 11/19/2007  . Blood transfusion without reported diagnosis   . Cardiomyopathy ([place])   . CHANGE IN BOWELS 02/01/2009  . Chronic  combined systolic (congestive) and diastolic (congestive) heart failure    a. echo 2015: EF 45% b. Echo 10/2015: EF 25-30% with diffuse HK  . CKD stage G2/A2, GFR 60 - 89 and albumin creatinine ratio 30 - 299 mg/g   . Compression fracture of L2 lumbar vertebra (HCC)   . Diarrhea 01/31/2009  . DIVERTICULOSIS, COLON 11/19/2007  . 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 GASTRITIS 11/19/2007  . GERD 11/19/2007  . HIATAL HERNIA 11/19/2007  . HOH (hard of hearing)   . HTN (hypertension)   . HYPERLIPIDEMIA 06/27/2009  . Hypokalemia   . Hypomagnesemia 10/2015  . HYPOTHYROIDISM 06/27/2009  . IBS (irritable bowel syndrome)   . Intussusception ([place])   . MELENA 08/01/2009  . Nonischemic cardiomyopathy ([place])    a. NST in 2010 with no evidence of ischemia.  Marland Kitchen PERSONAL HX COLONIC POLYPS 02/01/2009  . Protein-calorie malnutrition, severe ([place])   . TINEA CORPORIS 06/27/2009  . TOBACCO USE, QUIT 06/27/2009  . TREMOR, ESSENTIAL 06/27/2009   Past Surgical History  Past Surgical History:  Procedure Laterality Date  . BEZOAR EVACUATION  11/09/2015   ILEUM  . CHOLECYSTECTOMY    . ESOPHAGOGASTRODUODENOSCOPY N/A 04/28/2013   Procedure: ESOPHAGOGASTRODUODENOSCOPY (EGD);  Surgeon: Jerene Bears, MD;  Location: [facility];  Service: Gastroenterology;  Laterality: N/A;  . HERNIA REPAIR    . HIP ARTHROPLASTY Left 11/30/2013   Procedure: ARTHROPLASTY BIPOLAR HIP;  Surgeon: Loanne Drilling, MD;  Location: [facility];  Service: Orthopedics;  Laterality: Left;  . LAPAROTOMY N/A 11/09/2015   Procedure: EXPLORATORY LAPAROTOMY;  Surgeon: Luretha Murphy, MD;  Location: [facility];  Service: General;  Laterality: N/A;  . LUNG SURGERY  1984  . TONSILLECTOMY    . UMBILICAL HERNIA REPAIR    . VASECTOMY     Family History  Family History  Problem Relation Age of Onset  . Heart disease Mother   . Colon cancer Father   . Prostate cancer Brother    Social History  reports that he quit smoking about 73 years ago. His smoking use included Cigarettes.  He has a 72.00 pack-year smoking history. He has never used smokeless tobacco. He reports that he drinks about 0.6 oz of alcohol per week . He reports that he does not use drugs. Allergies  Allergies  Allergen Reactions  . Ciprofloxacin Other (See Comments)    Reaction:  Unknown   . Penicillins Rash and Other (See Comments)    Tolerated Unasyn 11/03/15 Has patient had a PCN reaction causing immediate rash, facial/tongue/throat swelling, SOB or lightheadedness with hypotension: No Has patient had a PCN reaction causing severe rash involving mucus membranes or skin necrosis: No Has patient had a PCN reaction that required hospitalization No Has patient had a PCN reaction occurring within the last 10 years: No If all of the above answers are "NO", then may proceed with Cephalosporin use.   Home medications Prior to Admission medications   Medication Sig Start Date End Date Taking? Authorizing Provider  apixaban (ELIQUIS) 5 MG TABS tablet Take 1 tablet (5 mg total) by mouth 2 (two) times daily. 11/16/15  Yes Nishant Dhungel, MD  tamsulosin (FLOMAX) 0.4 MG CAPS capsule Take 0.4 mg by mouth daily after supper.    Yes Historical Provider, MD  acidophilus (RISAQUAD) CAPS capsule Take 1 capsule by mouth daily.     Historical Provider, MD  esomeprazole (NEXIUM) 40 MG capsule Take 40 mg by mouth daily before breakfast.    Historical Provider, MD  esomeprazole (NEXIUM) 40 MG capsule TAKE 1 CAPSULE BY MOUTH  EVERY DAY BEFORE FIRST MEAL OF THE DAY 03/01/16   Beverley Fiedler, MD  furosemide (LASIX) 20 MG tablet Take 20 mg by mouth every other day.     Historical Provider, MD  furosemide (LASIX) 40 MG tablet Take 40 mg by mouth every other day.     Historical Provider, MD  lisinopril (PRINIVIL,ZESTRIL) 5 MG tablet Take 1 tablet (5 mg total) by mouth daily. 11/16/15   Nishant Dhungel, MD  metoprolol succinate (TOPROL-XL) 25 MG 24 hr tablet TAKE 1 TABLET BY MOUTH  DAILY WITH BREAKFAST 01/13/16   Pricilla Riffle, MD   Multiple Vitamins-Minerals (PRESERVISION AREDS 2) CAPS Take 1 capsule by mouth 2 (two) times daily.     Historical Provider, MD  potassium chloride (K-DUR,KLOR-CON) 10 MEQ tablet Take 10 mEq by mouth daily.    Historical Provider, MD  Protein (PROCEL) POWD Take 2 scoop by mouth 2 (two) times daily.    Historical Provider, MD  simvastatin (ZOCOR) 20 MG tablet Take 20 mg by mouth at bedtime.     Historical Provider, MD  traMADol-acetaminophen (ULTRACET) 37.5-325 MG tablet Take 1 tablet by mouth every 6 (six) hours as needed. 11/16/15   Nishant Dhungel, MD  UNABLE TO FIND  Med Name: MedPass 120 mL po TID for nutritional support    Historical Provider, MD   Liver Function Tests No results for input(s): AST, ALT, ALKPHOS, BILITOT, PROT, ALBUMIN in the last 168 hours. No results for input(s): LIPASE, AMYLASE in the last 168 hours. CBC  Recent Labs Lab 03/27/16 1852  WBC 12.9*  HGB 12.5*  HCT 37.7*  MCV 88.9  PLT 115   Basic Metabolic Panel  Recent Labs Lab 03/27/16 1852  NA 142  K 4.5  CL 106  CO2 28  GLUCOSE 124*  BUN 35*  CREATININE 1.13  CALCIUM 8.8*     Vitals:   03/27/16 2032 03/27/16 2035 03/27/16 2124 03/27/16 2130  BP:  133/62 119/76 123/68  Pulse:  66 65 65  Resp:  '18 15 18  '$ Temp:  97.7 F (36.5 C)    TempSrc:  Oral    SpO2: 90% 95% 96% 97%   Exam: Gen elderly WM  No rash, cyanosis or gangrene Sclera anicteric, throat clear  No jvd or bruits Chest clear bilat RRR no MRG Abd soft ntnd no mass or ascites +bs  GU in diapers MS no joint effusions or deformity Ext trace LE edema / no wounds or ulcers Neuro is alert, Ox 3 , nf, HOH   Na 142  K 4.5   BUN 35   Cr 1.13  Ca 8.8   eGFR 54   WBC 12k  Hb 12.5  plt 194  Glu 124  EKG (independ reviewed) > NSR, LAFB and old ant MI (PR[facility]) CXR (independ reviewed) > calcified pleural plaques,  IS edema Hip xrays > R subcapital femoral neck fracture, nondisplaced.  Old L hip arthroplasty.    Assessment: 1. Fall / R  subcapital hip fracture 2. Acute/ chronic systolic CHF - asymptomatic but low O2 sat and CXR with IS edema.   3. HTN - on BB,acei, HCTZ, lasix 4. CKD 2/3 5. Hist afib on Eliquis/BB - is on NSR by EKG  Plan - admit, hold Eliquis, IV lasix, cont BB/ acei for now.        Roney Jaffe D [facility] Pager [phone]   If 7PM-7AM, please contact night-coverage [email or web address] Password [email or web address] 03/27/2016, 10:06 PM

## 2016-03-27 NOTE — ED Provider Notes (Addendum)
[facility] DEPT Provider Note   CSN: [id number] Arrival date & time: 03/27/16  1650     History   Chief Complaint No chief complaint on file.   HPI [name] is a 83 y.o. male.  HPI Patient presents to the emergency department with complaints of fall at the house today.  He states he was walking without his walker.  He fell onto his right side.  His son found him laying on his right side as well.  He presents complaining of focal right hip pain.  Family reports no mental status changes.  Patient denies neck pain.  Patient was brought to the ER via EMS for further evaluation.  He does have a history of atrial fibrillation on ELIQUIS.  Pain is moderate in severity worse with range of motion of his right hip.   Past Medical History:  Diagnosis Date  . Aspiration pneumonia ([place])   . Atrial fibrillation with RVR ([place])   . BARRETTS ESOPHAGUS 11/19/2007  . Blood transfusion without reported diagnosis   . Cardiomyopathy ([place])   . CHANGE IN BOWELS 02/01/2009  . Chronic combined systolic (congestive) and diastolic (congestive) heart failure ([place])    a. echo 2015: EF 45% b. Echo 10/2015: EF 25-30% with diffuse HK  . CKD stage G2/A2, GFR 60 - 89 and albumin creatinine ratio 30 - 299 mg/g   . Compression fracture of L2 lumbar vertebra (HCC)   . Diarrhea 01/31/2009  . DIVERTICULOSIS, COLON 11/19/2007  . 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 GASTRITIS 11/19/2007  . GERD 11/19/2007  . HIATAL HERNIA 11/19/2007  . HOH (hard of hearing)   . HTN (hypertension)   . HYPERLIPIDEMIA 06/27/2009  . Hypokalemia   . Hypomagnesemia 10/2015  . HYPOTHYROIDISM 06/27/2009  . IBS (irritable bowel syndrome)   . Intussusception ([place])   . MELENA 08/01/2009  . Nonischemic cardiomyopathy ([place])    a. NST in 2010 with no evidence of ischemia.  Marland Kitchen PERSONAL HX COLONIC POLYPS 02/01/2009  . Protein-calorie malnutrition, severe ([place])   . TINEA CORPORIS 06/27/2009  . TOBACCO USE, QUIT 06/27/2009  . TREMOR, ESSENTIAL 06/27/2009     Patient Active Problem List   Diagnosis Date Noted  . Acute on chronic systolic heart failure ([employer]) 11/16/2015  . Hypokalemia 11/16/2015  . Hard of hearing 11/16/2015  . 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 Cardiomyopathy ([place])   . Acute respiratory failure ([place])   . Small bowel obstruction from bezoar s/p ex lap & evacuation 11/09/2015 11/03/2015  . Intussusception ([place]) 11/03/2015  . Compression fracture of L2 lumbar vertebra ([place]) 03/16/2015  . Abrasion 03/05/2015  . Lipoma of back 03/01/2015  . Paroxysmal atrial fibrillation ([place]) 12/10/2013  . Hyponatremia 12/03/2013  . Leukocytosis, unspecified 12/02/2013  . Femoral neck fracture ([place]) 11/30/2013  . Acute on chronic renal failure ([place]) 11/30/2013  . Chronic combined systolic and diastolic CHF (congestive heart failure) ([place]) 11/30/2013  . A-fib ([place]) 07/11/2013  . Protein-calorie malnutrition, severe ([place]) 07/10/2013  . Acute encephalopathy 07/10/2013  . CAP (community acquired pneumonia) 07/09/2013  . CKD (chronic kidney disease) stage 2, GFR 60-89 ml/min 07/09/2013  . Metabolic acidosis [id number]  . Septic shock ([place]) 07/09/2013  . BPH (benign prostatic hyperplasia) 02/04/2012  . MELENA 08/01/2009  . TINEA CORPORIS 06/27/2009  . Hyperlipemia 06/27/2009  . TREMOR, ESSENTIAL 06/27/2009  . Essential hypertension 06/27/2009  . TOBACCO USE, QUIT 06/27/2009  . PERSONAL HX COLONIC POLYPS 02/01/2009  . DIARRHEA 01/31/2009  .  GERD 11/19/2007  . BARRETTS ESOPHAGUS 11/19/2007  . GASTRITIS 11/19/2007  . HIATAL HERNIA 11/19/2007  . DIVERTICULOSIS, COLON 11/19/2007    Past Surgical History:  Procedure Laterality Date  . BEZOAR EVACUATION  11/09/2015   ILEUM  . CHOLECYSTECTOMY    . ESOPHAGOGASTRODUODENOSCOPY N/A 04/28/2013   Procedure: ESOPHAGOGASTRODUODENOSCOPY (EGD);  Surgeon: Jerene Bears, MD;  Location: [facility];  Service: Gastroenterology;  Laterality: N/A;  . HERNIA REPAIR    . HIP  ARTHROPLASTY Left 11/30/2013   Procedure: ARTHROPLASTY BIPOLAR HIP;  Surgeon: [name], MD;  Location: [facility];  Service: Orthopedics;  Laterality: Left;  . LAPAROTOMY N/A 11/09/2015   Procedure: EXPLORATORY LAPAROTOMY;  Surgeon: [name], MD;  Location: [facility];  Service: General;  Laterality: N/A;  . LUNG SURGERY  1984  . TONSILLECTOMY    . UMBILICAL HERNIA REPAIR    . VASECTOMY         Home Medications    Prior to Admission medications   Medication Sig Start Date End Date Taking? Authorizing Provider  acidophilus (RISAQUAD) CAPS capsule Take 1 capsule by mouth daily.     Historical Provider, MD  apixaban (ELIQUIS) 5 MG TABS tablet Take 1 tablet (5 mg total) by mouth 2 (two) times daily. 11/16/15   Nishant Dhungel, MD  esomeprazole (NEXIUM) 40 MG capsule Take 40 mg by mouth daily before breakfast.    Historical Provider, MD  esomeprazole (NEXIUM) 40 MG capsule TAKE 1 CAPSULE BY MOUTH  EVERY DAY BEFORE FIRST MEAL OF THE DAY 03/01/16   Jerene Bears, MD  furosemide (LASIX) 20 MG tablet Take 20 mg by mouth every other day.     Historical Provider, MD  furosemide (LASIX) 40 MG tablet Take 40 mg by mouth every other day.     Historical Provider, MD  lisinopril (PRINIVIL,ZESTRIL) 5 MG tablet Take 1 tablet (5 mg total) by mouth daily. 11/16/15   Nishant Dhungel, MD  metoprolol succinate (TOPROL-XL) 25 MG 24 hr tablet TAKE 1 TABLET BY MOUTH  DAILY WITH BREAKFAST 01/13/16   Fay Records, MD  Multiple Vitamins-Minerals (PRESERVISION AREDS 2) CAPS Take 1 capsule by mouth 2 (two) times daily.     Historical Provider, MD  potassium chloride (K-DUR,KLOR-CON) 10 MEQ tablet Take 10 mEq by mouth daily.    Historical Provider, MD  Protein (PROCEL) POWD Take 2 scoop by mouth 2 (two) times daily.    Historical Provider, MD  simvastatin (ZOCOR) 20 MG tablet Take 20 mg by mouth at bedtime.     Historical Provider, MD  tamsulosin (FLOMAX) 0.4 MG CAPS capsule Take 0.4 mg by mouth daily after supper.      Historical Provider, MD  traMADol-acetaminophen (ULTRACET) 37.5-325 MG tablet Take 1 tablet by mouth every 6 (six) hours as needed. 11/16/15   Nishant Dhungel, MD  UNABLE TO FIND Med Name: MedPass 120 mL po TID for nutritional support    Historical Provider, MD    Family History Family History  Problem Relation Age of Onset  . Heart disease Mother   . Colon cancer Father   . Prostate cancer Brother     Social History Social History  Substance Use Topics  . Smoking status: Former Smoker    Packs/day: 3.00    Years: 24.00    Types: Cigarettes    Quit date: 03/27/1943  . Smokeless tobacco: Never Used     Comment: Widowed x 2, has lady friend  . Alcohol use 0.6 oz/week  1 Standard drinks or equivalent per week     Comment: occassional     Allergies   Ciprofloxacin and Penicillins   Review of Systems Review of Systems  All other systems reviewed and are negative.    Physical Exam Updated Vital Signs BP 136/68 (BP Location: Left Arm)   Pulse 66   Temp 97.7 F (36.5 C) (Oral)   Resp 18   SpO2 98%   Physical Exam  Constitutional: He is oriented to person, place, and time. He appears well-developed and well-nourished.  HENT:  Head: Normocephalic and atraumatic.  Eyes: EOM are normal.  Neck: Normal range of motion.  C-spine nontender.  Cardiovascular: Normal rate, regular rhythm, normal heart sounds and intact distal pulses.   Pulmonary/Chest: Effort normal and breath sounds normal. No respiratory distress.  Abdominal: Soft. He exhibits no distension. There is no tenderness.  Musculoskeletal: Normal range of motion.  Limited range of motion of the right hip secondary to pain.  No obvious shortening or rotation.  Neurological: He is alert and oriented to person, place, and time.  Skin: Skin is warm and dry.  Psychiatric: He has a normal mood and affect. Judgment normal.  Nursing note and vitals reviewed.    ED Treatments / Results  Labs (all labs ordered are  listed, but only abnormal results are displayed) Labs Reviewed  CBC - Abnormal; Notable for the following:       Result Value   WBC 12.9 (*)    Hemoglobin 12.5 (*)    HCT 37.7 (*)    All other components within normal limits  BASIC METABOLIC PANEL - Abnormal; Notable for the following:    Glucose, Bld 124 (*)    BUN 35 (*)    Calcium 8.8 (*)    GFR calc non Af Amer 54 (*)    All other components within normal limits  PROTIME-INR  TYPE AND SCREEN    EKG  EKG Interpretation  Date/Time:  Tuesday March 27 2016 19:05:52 EST Ventricular Rate:  65 PR Interval:    QRS Duration: 104 QT Interval:  412 QTC Calculation: 429 R Axis:   -42 Text Interpretation:  Sinus rhythm Left anterior fascicular block Anterior infarct, old Nonspecific T abnormalities, lateral leads No significant change was found Confirmed by Kailin Leu  MD, Lennette Bihari ([id number]) on 03/27/2016 7:21:27 PM       Radiology Dg Chest 1 View  Result Date: 03/27/2016 CLINICAL DATA:  Chest pain after fall.  Preop. EXAM: CHEST 1 VIEW COMPARISON:  None. FINDINGS: Stable cardiomegaly with aortic atherosclerosis. Diffuse calcified pleural plaque with mild interstitial edema. No pneumonic consolidation or pneumothorax. Chronic degenerative change of both shoulders. IMPRESSION: Stable cardiomegaly with aortic atherosclerosis. Calcified pleural plaque consistent with prior asbestos exposure. Mild interstitial edema. No pneumonic consolidations. Electronically Signed   By: Ashley Royalty M.D.   On: 03/27/2016 19:05   Dg Hip Unilat W Or Wo Pelvis 2-3 Views Right  Result Date: 03/27/2016 CLINICAL DATA:  Fall.  Right hip pain. EXAM: DG HIP (WITH OR WITHOUT PELVIS) 2-3V RIGHT COMPARISON:  None. FINDINGS: The bones appear diffusely osteopenic. An impacted subcapital right femoral neck fracture is identified. The right femoral head appears located. Previous left hip arthroplasty. IMPRESSION: 1. Impacted right subcapital femoral neck fracture. Electronically  Signed   By: Kerby Moors M.D.   On: 03/27/2016 19:04    Procedures Procedures (including critical care time)  Medications Ordered in ED Medications  morphine 2 MG/ML injection 2 mg (not administered)  Initial Impression / Assessment and Plan / ED Course  I have reviewed the triage vital signs and the nursing notes.  Pertinent labs & imaging results that were available during my care of the patient were reviewed by me and considered in my medical decision making (see chart for details).  Clinical Course     Impacted right subcapital hip fracture.  C-spine nontender.  No signs of trauma to his head.  Patient be admitted to the hospitalist service.  Prior left hip surgery by Dr. Wynelle Link with [facility] orthopedics.   Orthopedics: Dr Stann Mainland (will plan on operative repair tomorrow afternoon)  Final Clinical Impressions(s) / ED Diagnoses   Final diagnoses:  Closed right hip fracture, initial encounter [facility])    New Prescriptions New Prescriptions   No medications on file     Jola Schmidt, MD 03/27/16 2005    Jola Schmidt, MD 03/27/16 2019

## 2016-03-28 DIAGNOSIS — N182 Chronic kidney disease, stage 2 (mild): Secondary | ICD-10-CM

## 2016-03-28 LAB — BASIC METABOLIC PANEL
ANION GAP: 7 (ref 5–15)
BUN: 31 mg/dL — ABNORMAL HIGH (ref 6–20)
CO2: 29 mmol/L (ref 22–32)
Calcium: 8.6 mg/dL — ABNORMAL LOW (ref 8.9–10.3)
Chloride: 104 mmol/L (ref 101–111)
Creatinine, Ser: 1.26 mg/dL — ABNORMAL HIGH (ref 0.61–1.24)
GFR calc Af Amer: 55 mL/min — ABNORMAL LOW (ref >60)
GFR, EST NON AFRICAN AMERICAN: 48 mL/min — AB (ref >60)
GLUCOSE: 125 mg/dL — AB (ref 65–99)
POTASSIUM: 3.6 mmol/L (ref 3.5–5.1)
Sodium: 140 mmol/L (ref 135–145)

## 2016-03-28 LAB — CBC
HCT: 36.3 % — ABNORMAL LOW (ref 39.0–52.0)
HEMOGLOBIN: 12.4 g/dL — AB (ref 13.0–17.0)
MCH: 30.2 pg (ref 26.0–34.0)
MCHC: 34.2 g/dL (ref 30.0–36.0)
MCV: 88.5 fL (ref 78.0–100.0)
PLATELETS: 210 10*3/uL (ref 150–400)
RBC: 4.1 MIL/uL — AB (ref 4.22–5.81)
RDW: 13.7 % (ref 11.5–15.5)
WBC: 10.7 10*3/uL — AB (ref 4.0–10.5)

## 2016-03-28 LAB — SURGICAL PCR SCREEN
MRSA, PCR: NEGATIVE
Staphylococcus aureus: NEGATIVE

## 2016-03-28 MED ORDER — ENSURE ENLIVE PO LIQD
237.0000 mL | Freq: Three times a day (TID) | ORAL | Status: DC
  Administered 2016-03-30: 237 mL via ORAL

## 2016-03-28 NOTE — Progress Notes (Signed)
Initial Nutrition Assessment  DOCUMENTATION CODES:   Severe malnutrition in context of chronic illness  INTERVENTION:  Will order Ensure Enlive po TID with meals in anticipation of diet advancement after surgery, each supplement provides 350 kcal and 20 grams of protein.  NUTRITION DIAGNOSIS:   Increased nutrient needs related to other (see comment) (healing s/p hip fixation) as evidenced by estimated needs.  GOAL:   Patient will meet greater than or equal to 90% of their needs  MONITOR:   PO intake, Supplement acceptance, Labs, Weight trends, I & O's  REASON FOR ASSESSMENT:   Consult Hip fracture protocol  ASSESSMENT:   83 y.o. male past medical history of essential hypertension, chronic atrial fibrillation on Eluquis, with combined systolic and diastolic heart failure fell presenting after mechanical fall, x-ray in the ED showed a right subcapital hip fracture.   Spoke with patient at bedside. Patient hard of hearing. He reports his appetite is "fair" now and that at baseline he eats 2-3 meals per day. Denies N/V or abdominal pain. Patient was unsure of UBW or weight history. No family at bedside. Per chart patient has lost 17 lbs (11% body weight) over 3 months, which is significant for time frame. However, previous 5 weight readings were exactly 152 lbs, so likely not true weights.   Medications reviewed and include: acidophilus, cefazolin, Colace, Prosight multivitamin, pantoprazole, potassium chloride 10 mEq daily.  Labs reviewed: BUN 31, Creatinine 1.26.   Nutrition-Focused physical exam completed. Findings are severe fat depletion, severe muscle depletion, and mild edema. Patient meets criteria for severe chronic malnutrition in setting of NFPE findings.  Diet Order:  Diet NPO time specified Except for: Sips with Meds  Skin:  Reviewed, no issues  Last BM:  Unknown  Height:   Ht Readings from Last 1 Encounters:  03/27/16 5\' 5"  (1.651 m)    Weight:   Wt  Readings from Last 1 Encounters:  03/27/16 135 lb 9.3 oz (61.5 kg)    Ideal Body Weight:  61.8 kg  BMI:  Body mass index is 22.56 kg/m.  Estimated Nutritional Needs:   Kcal:  1440-1680 (MSJ x 1.2-1.4)  Protein:  75-90 grams (1.2-1.4 grams/kg)  Fluid:  >/= 1.5 L/day (25 ml/kg)  EDUCATION NEEDS:   No education needs identified at this time  Willey Blade, MS, RD, LDN Pager: [phone] After Hours Pager: [phone]

## 2016-03-28 NOTE — Progress Notes (Signed)
[facility] PROGRESS NOTE    Progress Note  [name]  [id number] DOB: 08/18/1921 DOA: 03/27/2016 PCP: Jenny Reichmann, MD     Brief Narrative:   [name] is an 83 y.o. male past medical history of essential hypertension, chronic atrial fibrillation on Eluquis, with combined systolic and diastolic heart failure fell this afternoon was found by his son landing on his right side, x-ray in the ED showed a right subcapital hip fracture  Assessment/Plan:   Closed right hip fracture, initial encounter [facility]): Hold Eluquis, continue beta blocker and ACE inhibitor. Dr. Stann Mainland was contacted by the ED which recommended intervention 1 03/28/2016.  Essential hypertension Blood pressure seems to be stable continue beta blocker, hold ACE inhibitor 24 hours prior to surgery along with diuretics.  CKD (chronic kidney disease) stage 2, GFR 60-89 ml/min Baseline will continue monitor.  Paroxysmal atrial fibrillation (HCC) Now insinus rhythm   DVT prophylaxis: lovenox Family Communication:none Disposition Plan/Barrier to D/C: SNF in 2 days Code Status:     Code Status Orders        Start     Ordered   03/27/16 2350  Full code  Continuous     03/27/16 2349    Code Status History    Date Active Date Inactive Code Status Order ID Comments User Context   11/09/2015  5:31 PM 11/16/2015  7:25 PM Full Code [id number]  [name], MD Inpatient   11/03/2015  5:39 PM 11/09/2015  5:31 PM Full Code [id number]  [name], MD ED   11/30/2013  6:50 PM 12/04/2013  7:21 PM Full Code [id number]  [name], MD Inpatient   11/30/2013 12:46 PM 11/30/2013  6:50 PM Full Code [id number]  [name], MD Inpatient   07/09/2013  1:13 AM 07/15/2013  5:00 PM Full Code [id number]  [name], MD Inpatient    Advance Directive Documentation   Flowsheet Row Most Recent Value  Type of Advance Directive  Healthcare Power of Attorney, Living will  Pre-existing out of facility DNR order (yellow form or  pink MOST form)  No data  "MOST" Form in Place?  No data        IV Access:    Peripheral IV   Procedures and diagnostic studies:   Dg Chest 1 View  Result Date: 03/27/2016 CLINICAL DATA:  Chest pain after fall.  Preop. EXAM: CHEST 1 VIEW COMPARISON:  None. FINDINGS: Stable cardiomegaly with aortic atherosclerosis. Diffuse calcified pleural plaque with mild interstitial edema. No pneumonic consolidation or pneumothorax. Chronic degenerative change of both shoulders. IMPRESSION: Stable cardiomegaly with aortic atherosclerosis. Calcified pleural plaque consistent with prior asbestos exposure. Mild interstitial edema. No pneumonic consolidations. Electronically Signed   By: Ashley Royalty M.D.   On: 03/27/2016 19:05   Ct Hip Right Wo Contrast  Result Date: 03/27/2016 CLINICAL DATA:  Right subcapital femoral neck fracture EXAM: CT OF THE RIGHT HIP WITHOUT CONTRAST TECHNIQUE: Multidetector CT imaging of the right hip was performed according to the standard protocol. Multiplanar CT image reconstructions were also generated. COMPARISON:  Same day radiographs from 03/27/2016 FINDINGS: Bones/Joint/Cartilage There is an impacted subcapital right femoral neck fracture along its superolateral aspect. Slight joint space narrowing the native right hip. No acetabular or pubic rami fracture. Ligaments Suboptimally assessed by CT. Muscles and Tendons Nonacute Soft tissues Small joint effusion. No subcutaneous contusion over the lateral aspect of the right hip. IMPRESSION: Superolateral subcapital femoral neck fracture with impaction of the femoral head. No acetabular fracture  identified. Electronically Signed   By: Ashley Royalty M.D.   On: 03/27/2016 21:45   Dg Hip Unilat W Or Wo Pelvis 2-3 Views Right  Result Date: 03/27/2016 CLINICAL DATA:  Fall.  Right hip pain. EXAM: DG HIP (WITH OR WITHOUT PELVIS) 2-3V RIGHT COMPARISON:  None. FINDINGS: The bones appear diffusely osteopenic. An impacted subcapital right femoral  neck fracture is identified. The right femoral head appears located. Previous left hip arthroplasty. IMPRESSION: 1. Impacted right subcapital femoral neck fracture. Electronically Signed   By: Kerby Moors M.D.   On: 03/27/2016 19:04     Medical Consultants:    None.  Anti-Infectives:   none  Subjective:    Shaune Pascal Pullman pain is controlled.  Objective:    Vitals:   03/27/16 2351 03/28/16 0618 03/28/16 0918 03/28/16 0920  BP: (!) 123/58 (!) 107/52 (!) 103/50   Pulse: 65 67  64  Resp: 20 (!) 22    Temp: 98.6 F (37 C) 98.5 F (36.9 C)    TempSrc: Oral Oral    SpO2: 98% 97%    Weight: 61.5 kg (135 lb 9.3 oz)     Height: 5\' 5"  (1.651 m)      No intake or output data in the 24 hours ending 03/28/16 0941 Filed Weights   03/27/16 2351  Weight: 61.5 kg (135 lb 9.3 oz)    Exam: General exam: In no acute distress. Respiratory system: Good air movement and clear to auscultation. Cardiovascular system: S1 & S2 heard, RRR. No JVD. Gastrointestinal system: Abdomen is nondistended, soft and nontender.  Skin: No rashes, lesions or ulcers Psychiatry: Judgement and insight appear normal. Mood & affect appropriate.    Data Reviewed:    Labs: Basic Metabolic Panel:  Recent Labs Lab 03/27/16 1852 03/28/16 0530  NA 142 140  K 4.5 3.6  CL 106 104  CO2 28 29  GLUCOSE 124* 125*  BUN 35* 31*  CREATININE 1.13 1.26*  CALCIUM 8.8* 8.6*   GFR Estimated Creatinine Clearance: 32.5 mL/min (by C-G formula based on SCr of 1.26 mg/dL (H)). Liver Function Tests: No results for input(s): AST, ALT, ALKPHOS, BILITOT, PROT, ALBUMIN in the last 168 hours. No results for input(s): LIPASE, AMYLASE in the last 168 hours. No results for input(s): AMMONIA in the last 168 hours. Coagulation profile  Recent Labs Lab 03/27/16 1852  INR 0.98    CBC:  Recent Labs Lab 03/27/16 1852 03/28/16 0530  WBC 12.9* 10.7*  HGB 12.5* 12.4*  HCT 37.7* 36.3*  MCV 88.9 88.5  PLT 194 210     Cardiac Enzymes: No results for input(s): CKTOTAL, CKMB, CKMBINDEX, TROPONINI in the last 168 hours. BNP (last 3 results) No results for input(s): PROBNP in the last 8760 hours. CBG: No results for input(s): GLUCAP in the last 168 hours. D-Dimer: No results for input(s): DDIMER in the last 72 hours. Hgb A1c: No results for input(s): HGBA1C in the last 72 hours. Lipid Profile: No results for input(s): CHOL, HDL, LDLCALC, TRIG, CHOLHDL, LDLDIRECT in the last 72 hours. Thyroid function studies: No results for input(s): TSH, T4TOTAL, T3FREE, THYROIDAB in the last 72 hours.  Invalid input(s): FREET3 Anemia work up: No results for input(s): VITAMINB12, FOLATE, FERRITIN, TIBC, IRON, RETICCTPCT in the last 72 hours. Sepsis Labs:  Recent Labs Lab 03/27/16 1852 03/28/16 0530  WBC 12.9* 10.7*   Microbiology Recent Results (from the past 240 hour(s))  Surgical pcr screen     Status: None   Collection Time:  03/28/16 12:18 AM  Result Value Ref Range Status   MRSA, PCR NEGATIVE NEGATIVE Final   Staphylococcus aureus NEGATIVE NEGATIVE Final    Comment:        The Xpert SA Assay (FDA approved for NASAL specimens in patients over 81 years of age), is one component of a comprehensive surveillance program.  Test performance has been validated by [facility] for patients greater than or equal to 9 year old. It is not intended to diagnose infection nor to guide or monitor treatment.      Medications:   . acidophilus  1 capsule Oral Daily  .  ceFAZolin (ANCEF) IV  2 g Intravenous On Call to OR  . docusate sodium  100 mg Oral BID  . furosemide  40 mg Intravenous Q12H  . lisinopril  10 mg Oral Daily   And  . hydrochlorothiazide  12.5 mg Oral Daily  . metoprolol succinate  25 mg Oral Q breakfast  . multivitamin  1 tablet Oral BID  . pantoprazole  40 mg Oral Daily  . potassium chloride  10 mEq Oral Daily  . simvastatin  20 mg Oral QHS  . tamsulosin  0.4 mg Oral QPC supper    Continuous Infusions:  Time spent: 15 min   LOS: 1 day   Charlynne Cousins  [facility] Pager [phone]  *Please refer to [email or web address], password [email or web address] to get updated schedule on who will round on this patient, as hospitalists switch teams weekly. If 7PM-7AM, please contact night-coverage at [email or web address], password [email or web address] for any overnight needs.  03/28/2016, 9:41 AM

## 2016-03-28 NOTE — Consult Note (Signed)
ORTHOPAEDIC CONSULTATION  REQUESTING PHYSICIAN: Charlynne Cousins, MD  PCP:  Jenny Reichmann, MD  Chief Complaint: Right hip pain following fall  HPI: [name] is a 83 y.o. male who complains of  Right hip pain.  On interview patient is somewhat confused and the history is obtained from chart review and direct conversation with the son, [name].   His PMHx consists of HTN, chronic a fib on Eliquis with CHF, and he sustained a right hip fracture from mechanical fall yesterday afternoon.  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  He lives with his  Past Medical History:  Diagnosis Date  . Aspiration pneumonia ([place])   . Atrial fibrillation with RVR ([place])   . BARRETTS ESOPHAGUS 11/19/2007  . Blood transfusion without reported diagnosis   . Cardiomyopathy ([place])   . CHANGE IN BOWELS 02/01/2009  . Chronic combined systolic (congestive) and diastolic (congestive) heart failure    a. echo 2015: EF 45% b. Echo 10/2015: EF 25-30% with diffuse HK  . CKD stage G2/A2, GFR 60 - 89 and albumin creatinine ratio 30 - 299 mg/g   . Compression fracture of L2 lumbar vertebra (HCC)   . Diarrhea 01/31/2009  . DIVERTICULOSIS, COLON 11/19/2007  . 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 GASTRITIS 11/19/2007  . GERD 11/19/2007  . HIATAL HERNIA 11/19/2007  . HOH (hard of hearing)   . HTN (hypertension)   . HYPERLIPIDEMIA 06/27/2009  . Hypokalemia   . Hypomagnesemia 10/2015  . HYPOTHYROIDISM 06/27/2009  . IBS (irritable bowel syndrome)   . Intussusception ([place])   . MELENA 08/01/2009  . Nonischemic cardiomyopathy ([place])    a. NST in 2010 with no evidence of ischemia.  Marland Kitchen PERSONAL HX COLONIC POLYPS 02/01/2009  . Protein-calorie malnutrition, severe ([place])   . TINEA CORPORIS 06/27/2009  . TOBACCO USE, QUIT 06/27/2009  . TREMOR, ESSENTIAL 06/27/2009   Past Surgical History:  Procedure Laterality Date  . BEZOAR EVACUATION  11/09/2015    ILEUM  . CHOLECYSTECTOMY    . ESOPHAGOGASTRODUODENOSCOPY N/A 04/28/2013   Procedure: ESOPHAGOGASTRODUODENOSCOPY (EGD);  Surgeon: Jerene Bears, MD;  Location: [facility];  Service: Gastroenterology;  Laterality: N/A;  . HERNIA REPAIR    . HIP ARTHROPLASTY Left 11/30/2013   Procedure: ARTHROPLASTY BIPOLAR HIP;  Surgeon: [name], MD;  Location: [facility];  Service: Orthopedics;  Laterality: Left;  . LAPAROTOMY N/A 11/09/2015   Procedure: EXPLORATORY LAPAROTOMY;  Surgeon: [name], MD;  Location: [facility];  Service: General;  Laterality: N/A;  . LUNG SURGERY  1984  . TONSILLECTOMY    . UMBILICAL HERNIA REPAIR    . VASECTOMY     Social History   Social History  . Marital status: Widowed    Spouse name: N/A  . Number of children: N/A  . Years of education: N/A   Occupational History  . retired Clinical biochemist    Social History Main Topics  . Smoking status: Former Smoker    Packs/day: 3.00    Years: 24.00    Types: Cigarettes    Quit date: 03/27/1943  . Smokeless tobacco: Never Used     Comment: Widowed x 2, has lady friend  . Alcohol use 0.6 oz/week    1 Standard drinks or equivalent per week     Comment: occassional  . Drug use: No  . Sexual activity: No   Other Topics Concern  . None  Social History Narrative  . None   Family History  Problem Relation Age of Onset  . Heart disease Mother   . Colon cancer Father   . Prostate cancer Brother    Allergies  Allergen Reactions  . Ciprofloxacin Other (See Comments)    Reaction:  Unknown   . Penicillins Rash and Other (See Comments)    Tolerated Unasyn 11/03/15 Has patient had a PCN reaction causing immediate rash, facial/tongue/throat swelling, SOB or lightheadedness with hypotension: No Has patient had a PCN reaction causing severe rash involving mucus membranes or skin necrosis: No Has patient had a PCN reaction that required hospitalization No Has patient had a PCN reaction occurring within the last 10 years:  No If all of the above answers are "NO", then may proceed with Cephalosporin use.   Prior to Admission medications   Medication Sig Start Date End Date Taking? Authorizing Provider  apixaban (ELIQUIS) 5 MG TABS tablet Take 1 tablet (5 mg total) by mouth 2 (two) times daily. 11/16/15  Yes Nishant Dhungel, MD  furosemide (LASIX) 20 MG tablet Take 20-40 mg by mouth as directed. Alternate Between Taking 20 mg one day Then Taking 40 mg the next day   Yes Historical Provider, MD  lisinopril-hydrochlorothiazide (PRINZIDE,ZESTORETIC) 10-12.5 MG tablet Take 1 tablet by mouth daily.   Yes Historical Provider, MD  metoprolol succinate (TOPROL-XL) 25 MG 24 hr tablet TAKE 1 TABLET BY MOUTH  DAILY WITH BREAKFAST 01/13/16  Yes Fay Records, MD  tamsulosin (FLOMAX) 0.4 MG CAPS capsule Take 0.4 mg by mouth daily after supper.    Yes Historical Provider, MD  acidophilus (RISAQUAD) CAPS capsule Take 1 capsule by mouth daily.     Historical Provider, MD  esomeprazole (NEXIUM) 40 MG capsule TAKE 1 CAPSULE BY MOUTH  EVERY DAY BEFORE FIRST MEAL OF THE DAY 03/01/16   Jerene Bears, MD  lisinopril (PRINIVIL,ZESTRIL) 5 MG tablet Take 1 tablet (5 mg total) by mouth daily. Patient not taking: Reported on 03/27/2016 11/16/15   Flonnie Overman Dhungel, MD  Multiple Vitamins-Minerals (PRESERVISION AREDS 2) CAPS Take 1 capsule by mouth 2 (two) times daily.     Historical Provider, MD  potassium chloride (K-DUR,KLOR-CON) 10 MEQ tablet Take 10 mEq by mouth daily.    Historical Provider, MD  Protein (PROCEL) POWD Take 2 scoop by mouth 2 (two) times daily.    Historical Provider, MD  simvastatin (ZOCOR) 20 MG tablet Take 20 mg by mouth at bedtime.     Historical Provider, MD  traMADol-acetaminophen (ULTRACET) 37.5-325 MG tablet Take 1 tablet by mouth every 6 (six) hours as needed. 11/16/15   Nishant Dhungel, MD  UNABLE TO FIND Med Name: MedPass 120 mL po TID for nutritional support    Historical Provider, MD   Dg Chest 1 View  Result Date:  03/27/2016 CLINICAL DATA:  Chest pain after fall.  Preop. EXAM: CHEST 1 VIEW COMPARISON:  None. FINDINGS: Stable cardiomegaly with aortic atherosclerosis. Diffuse calcified pleural plaque with mild interstitial edema. No pneumonic consolidation or pneumothorax. Chronic degenerative change of both shoulders. IMPRESSION: Stable cardiomegaly with aortic atherosclerosis. Calcified pleural plaque consistent with prior asbestos exposure. Mild interstitial edema. No pneumonic consolidations. Electronically Signed   By: Ashley Royalty M.D.   On: 03/27/2016 19:05   Ct Hip Right Wo Contrast  Result Date: 03/27/2016 CLINICAL DATA:  Right subcapital femoral neck fracture EXAM: CT OF THE RIGHT HIP WITHOUT CONTRAST TECHNIQUE: Multidetector CT imaging of the right hip was performed according to the  standard protocol. Multiplanar CT image reconstructions were also generated. COMPARISON:  Same day radiographs from 03/27/2016 FINDINGS: Bones/Joint/Cartilage There is an impacted subcapital right femoral neck fracture along its superolateral aspect. Slight joint space narrowing the native right hip. No acetabular or pubic rami fracture. Ligaments Suboptimally assessed by CT. Muscles and Tendons Nonacute Soft tissues Small joint effusion. No subcutaneous contusion over the lateral aspect of the right hip. IMPRESSION: Superolateral subcapital femoral neck fracture with impaction of the femoral head. No acetabular fracture identified. Electronically Signed   By: Ashley Royalty M.D.   On: 03/27/2016 21:45   Dg Hip Unilat W Or Wo Pelvis 2-3 Views Right  Result Date: 03/27/2016 CLINICAL DATA:  Fall.  Right hip pain. EXAM: DG HIP (WITH OR WITHOUT PELVIS) 2-3V RIGHT COMPARISON:  None. FINDINGS: The bones appear diffusely osteopenic. An impacted subcapital right femoral neck fracture is identified. The right femoral head appears located. Previous left hip arthroplasty. IMPRESSION: 1. Impacted right subcapital femoral neck fracture.  Electronically Signed   By: Kerby Moors M.D.   On: 03/27/2016 19:04    Positive ROS: All other systems have been reviewed and were otherwise negative with the exception of those mentioned in the HPI and as above.  Physical Exam: General: Alert but disoriented, no acute distress Cardiovascular: No pedal edema Respiratory: No cyanosis, no use of accessory musculature GI: No organomegaly, abdomen is soft and non-tender Skin: No lesions in the area of chief complaint Neurologic: Sensation intact distally Lymphatic: No axillary or cervical lymphadenopathy  MUSCULOSKELETAL:  RLE- grossly intact without deformity, spontaneously moves to stimulation, skin is wwp, 2 +DP pulse  Assessment: Right subcapital hip fracture with valgus impaction, Garden type I   Plan: -plan for OR tomorrow morning for percutaneous pinning - I have discussed case with his son, [name] and conveyed that this surgery will present lower morbidity and quicker recovery vs. Hemi, and with this fracture pattern based on CT scan it is the appropriate treatment. -NPO tonight at [facility] -NWB RLE until post op.    Nicholes Stairs, MD Cell [phone]    03/28/2016 6:45 PM

## 2016-03-29 ENCOUNTER — Inpatient Hospital Stay (HOSPITAL_COMMUNITY): Payer: Medicare Other | Admitting: Anesthesiology

## 2016-03-29 ENCOUNTER — Inpatient Hospital Stay (HOSPITAL_COMMUNITY): Payer: Medicare Other

## 2016-03-29 ENCOUNTER — Encounter (HOSPITAL_COMMUNITY)
Admission: EM | Disposition: A | Discharge status: Skilled Nursing Facility | Payer: Self-pay | Source: Home / Self Care | Attending: Internal Medicine

## 2016-03-29 ENCOUNTER — Encounter (HOSPITAL_COMMUNITY): Payer: Self-pay | Admitting: Anesthesiology

## 2016-03-29 HISTORY — PX: HIP PINNING,CANNULATED: SHX1758

## 2016-03-29 SURGERY — FIXATION, FEMUR, NECK, PERCUTANEOUS, USING SCREW
Anesthesia: General | Laterality: Right

## 2016-03-29 MED ORDER — LIDOCAINE 2% (20 MG/ML) 5 ML SYRINGE
INTRAMUSCULAR | Status: AC
  Filled 2016-03-29: qty 5

## 2016-03-29 MED ORDER — ONDANSETRON HCL 4 MG/2ML IJ SOLN
INTRAMUSCULAR | Status: DC | PRN
  Administered 2016-03-29: 4 mg via INTRAVENOUS

## 2016-03-29 MED ORDER — PHENYLEPHRINE HCL 10 MG/ML IJ SOLN
INTRAMUSCULAR | Status: DC | PRN
  Administered 2016-03-29 (×8): 80 ug via INTRAVENOUS

## 2016-03-29 MED ORDER — PHENYLEPHRINE HCL 10 MG/ML IJ SOLN
INTRAMUSCULAR | Status: AC
  Filled 2016-03-29: qty 1

## 2016-03-29 MED ORDER — CEFAZOLIN SODIUM-DEXTROSE 2-4 GM/100ML-% IV SOLN
INTRAVENOUS | Status: AC
  Filled 2016-03-29: qty 100

## 2016-03-29 MED ORDER — LACTATED RINGERS IV SOLN
INTRAVENOUS | Status: DC | PRN
  Administered 2016-03-29: 12:00:00 via INTRAVENOUS

## 2016-03-29 MED ORDER — PROMETHAZINE HCL 25 MG/ML IJ SOLN: 6.2500 mg | INTRAMUSCULAR | Status: DC | PRN

## 2016-03-29 MED ORDER — PHENYLEPHRINE 40 MCG/ML (10ML) SYRINGE FOR IV PUSH (FOR BLOOD PRESSURE SUPPORT)
PREFILLED_SYRINGE | INTRAVENOUS | Status: AC
  Filled 2016-03-29: qty 10

## 2016-03-29 MED ORDER — ONDANSETRON HCL 4 MG PO TABS: 4.0000 mg | ORAL_TABLET | Freq: Four times a day (QID) | ORAL | Status: DC | PRN

## 2016-03-29 MED ORDER — CHLORHEXIDINE GLUCONATE 4 % EX LIQD
Freq: Once | CUTANEOUS | Status: DC
  Filled 2016-03-29: qty 15

## 2016-03-29 MED ORDER — PROPOFOL 10 MG/ML IV BOLUS
INTRAVENOUS | Status: AC
  Filled 2016-03-29: qty 20

## 2016-03-29 MED ORDER — SUCCINYLCHOLINE CHLORIDE 20 MG/ML IJ SOLN
INTRAMUSCULAR | Status: DC | PRN
  Administered 2016-03-29: 80 mg via INTRAVENOUS

## 2016-03-29 MED ORDER — PHENYLEPHRINE HCL 10 MG/ML IJ SOLN
INTRAVENOUS | Status: DC | PRN
  Administered 2016-03-29: 40 ug/min via INTRAVENOUS

## 2016-03-29 MED ORDER — LIDOCAINE HCL (CARDIAC) 20 MG/ML IV SOLN
INTRAVENOUS | Status: DC | PRN
  Administered 2016-03-29: 60 mg via INTRAVENOUS

## 2016-03-29 MED ORDER — SUCCINYLCHOLINE CHLORIDE 200 MG/10ML IV SOSY
PREFILLED_SYRINGE | INTRAVENOUS | Status: AC
  Filled 2016-03-29: qty 10

## 2016-03-29 MED ORDER — METOCLOPRAMIDE HCL 5 MG PO TABS: 5.0000 mg | ORAL_TABLET | Freq: Three times a day (TID) | ORAL | Status: DC | PRN

## 2016-03-29 MED ORDER — FENTANYL CITRATE (PF) 100 MCG/2ML IJ SOLN
INTRAMUSCULAR | Status: AC
  Filled 2016-03-29: qty 2

## 2016-03-29 MED ORDER — PROPOFOL 10 MG/ML IV BOLUS
INTRAVENOUS | Status: DC | PRN
  Administered 2016-03-29: 60 mg via INTRAVENOUS
  Administered 2016-03-29: 10 mg via INTRAVENOUS

## 2016-03-29 MED ORDER — ONDANSETRON HCL 4 MG/2ML IJ SOLN: 4.0000 mg | Freq: Four times a day (QID) | INTRAMUSCULAR | Status: DC | PRN

## 2016-03-29 MED ORDER — ACETAMINOPHEN 325 MG PO TABS: 650.0000 mg | ORAL_TABLET | Freq: Four times a day (QID) | ORAL | Status: DC | PRN

## 2016-03-29 MED ORDER — FENTANYL CITRATE (PF) 100 MCG/2ML IJ SOLN
INTRAMUSCULAR | Status: DC | PRN
  Administered 2016-03-29: 25 ug via INTRAVENOUS

## 2016-03-29 MED ORDER — 0.9 % SODIUM CHLORIDE (POUR BTL) OPTIME
TOPICAL | Status: DC | PRN
  Administered 2016-03-29: 1000 mL

## 2016-03-29 MED ORDER — FENTANYL CITRATE (PF) 100 MCG/2ML IJ SOLN: 25.0000 ug | INTRAMUSCULAR | Status: DC | PRN

## 2016-03-29 MED ORDER — METOCLOPRAMIDE HCL 5 MG/ML IJ SOLN: 5.0000 mg | Freq: Three times a day (TID) | INTRAMUSCULAR | Status: DC | PRN

## 2016-03-29 MED ORDER — CEFAZOLIN SODIUM-DEXTROSE 2-3 GM-% IV SOLR
INTRAVENOUS | Status: DC | PRN
  Administered 2016-03-29: 2 g via INTRAVENOUS

## 2016-03-29 MED ORDER — ACETAMINOPHEN 650 MG RE SUPP: 650.0000 mg | Freq: Four times a day (QID) | RECTAL | Status: DC | PRN

## 2016-03-29 MED ORDER — ONDANSETRON HCL 4 MG/2ML IJ SOLN
INTRAMUSCULAR | Status: AC
  Filled 2016-03-29: qty 2

## 2016-03-29 SURGICAL SUPPLY — 43 items
BAG ZIPLOCK 12X15 (MISCELLANEOUS) IMPLANT
BIT DRILL 4.8X300 (BIT) ×2 IMPLANT
BIT DRILL 4.8X300MM (BIT) ×1
BNDG GAUZE ELAST 4 BULKY (GAUZE/BANDAGES/DRESSINGS) ×3 IMPLANT
CLOSURE WOUND 1/4X4 (GAUZE/BANDAGES/DRESSINGS) ×1
COVER PERINEAL POST (MISCELLANEOUS) ×3 IMPLANT
DERMABOND ADVANCED (GAUZE/BANDAGES/DRESSINGS) ×2
DERMABOND ADVANCED .7 DNX12 (GAUZE/BANDAGES/DRESSINGS) ×1 IMPLANT
DRAPE STERI IOBAN 125X83 (DRAPES) ×3 IMPLANT
DRAPE U-SHAPE 47X51 STRL (DRAPES) ×6 IMPLANT
DRSG AQUACEL AG ADV 3.5X 6 (GAUZE/BANDAGES/DRESSINGS) IMPLANT
DRSG TEGADERM 4X4.75 (GAUZE/BANDAGES/DRESSINGS) ×3 IMPLANT
DURAPREP 26ML APPLICATOR (WOUND CARE) ×3 IMPLANT
ELECT REM PT RETURN 9FT ADLT (ELECTROSURGICAL) ×3
ELECTRODE REM PT RTRN 9FT ADLT (ELECTROSURGICAL) ×1 IMPLANT
GAUZE SPONGE 2X2 8PLY STRL LF (GAUZE/BANDAGES/DRESSINGS) ×1 IMPLANT
GLOVE BIOGEL M 7.0 STRL (GLOVE) IMPLANT
GLOVE BIOGEL PI IND STRL 7.5 (GLOVE) ×1 IMPLANT
GLOVE BIOGEL PI IND STRL 8.5 (GLOVE) IMPLANT
GLOVE BIOGEL PI INDICATOR 7.5 (GLOVE) ×2
GLOVE BIOGEL PI INDICATOR 8.5 (GLOVE)
GLOVE ECLIPSE 8.0 STRL XLNG CF (GLOVE) IMPLANT
GLOVE ORTHO TXT STRL SZ7.5 (GLOVE) ×3 IMPLANT
GOWN STRL REUS W/TWL LRG LVL3 (GOWN DISPOSABLE) ×3 IMPLANT
GOWN STRL REUS W/TWL XL LVL3 (GOWN DISPOSABLE) IMPLANT
KIT BASIN OR (CUSTOM PROCEDURE TRAY) ×3 IMPLANT
MANIFOLD NEPTUNE II (INSTRUMENTS) ×3 IMPLANT
NS IRRIG 1000ML POUR BTL (IV SOLUTION) ×3 IMPLANT
PACK GENERAL/GYN (CUSTOM PROCEDURE TRAY) ×3 IMPLANT
PIN GUIDE DRILL TIP 2.8X300 (DRILL) ×9 IMPLANT
POSITIONER SURGICAL ARM (MISCELLANEOUS) ×3 IMPLANT
SCREW CANNULATED 8.0*85 HIP (Screw) ×3 IMPLANT
SCREW CANNULATED 8.8X80MM HIP (Screw) ×3 IMPLANT
SCREW FULL THREAD 8.0X80MM (Screw) ×3 IMPLANT
SPONGE GAUZE 2X2 STER 10/PKG (GAUZE/BANDAGES/DRESSINGS) ×2
STRIP CLOSURE SKIN 1/4X4 (GAUZE/BANDAGES/DRESSINGS) ×2 IMPLANT
SUT MNCRL AB 4-0 PS2 18 (SUTURE) ×3 IMPLANT
SUT VIC AB 2-0 CT1 27 (SUTURE) ×4
SUT VIC AB 2-0 CT1 TAPERPNT 27 (SUTURE) ×2 IMPLANT
TAPE STRIPS DRAPE STRL (GAUZE/BANDAGES/DRESSINGS) ×3 IMPLANT
TOWEL OR 17X26 10 PK STRL BLUE (TOWEL DISPOSABLE) ×3 IMPLANT
WASHER 8.0 (Orthopedic Implant) ×6 IMPLANT
WASHER CANN FLAT 8 (Orthopedic Implant) ×3 IMPLANT

## 2016-03-29 NOTE — Anesthesia Preprocedure Evaluation (Addendum)
Anesthesia Evaluation  Patient identified by MRN, date of birth, ID band Patient awake    Reviewed: Allergy & Precautions, NPO status , Patient's Chart, lab work & pertinent test results  Airway Mallampati: II  TM Distance: >3 FB Neck ROM: Full    Dental no notable dental hx.    Pulmonary neg pulmonary ROS, former smoker,    Pulmonary exam normal breath sounds clear to auscultation       Cardiovascular hypertension, +CHF  Normal cardiovascular exam Rhythm:Regular Rate:Normal  Chronic combined systolic (congestive) and diastolic (congestive) heart failure  a. echo 2015: EF 45% b. Echo 10/2015: EF 25-30% with diffuse HK    Neuro/Psych negative neurological ROS  negative psych ROS   GI/Hepatic Neg liver ROS, GERD  ,  Endo/Other  negative endocrine ROS  Renal/GU Renal InsufficiencyRenal disease  negative genitourinary   Musculoskeletal negative musculoskeletal ROS (+)   Abdominal   Peds negative pediatric ROS (+)  Hematology negative hematology ROS (+)   Anesthesia Other Findings   Reproductive/Obstetrics negative OB ROS                             Anesthesia Physical Anesthesia Plan  ASA: IV  Anesthesia Plan: General   Post-op Pain Management:    Induction: Intravenous  Airway Management Planned: Oral ETT  Additional Equipment:   Intra-op Plan:   Post-operative Plan: Extubation in OR  Informed Consent: I have reviewed the patients History and Physical, chart, labs and discussed the procedure including the risks, benefits and alternatives for the proposed anesthesia with the patient or authorized representative who has indicated his/her understanding and acceptance.   Dental advisory given  Plan Discussed with: CRNA and Surgeon  Anesthesia Plan Comments:         Anesthesia Quick Evaluation

## 2016-03-29 NOTE — Plan of Care (Signed)
Problem: Safety: Goal: Ability to remain free from injury will improve Outcome: Completed/Met Date Met: 03/29/16 Discussed safety prevention, precaution plaln with pt and family. Bed alarm set

## 2016-03-29 NOTE — Op Note (Addendum)
Date of Surgery: 03/29/2016  INDICATIONS: [name] is a 83 y.o.-year-old male who sustained a hip fracture; he was indicated for hip pinning due to the valgus impacted nature of the fracture as noted on CT scan, and came to the operating room today for this procedure. The patient and family did consent to the procedure after discussion of the risks and benefits.   PREOPERATIVE DIAGNOSIS: right hip fracture  POSTOPERATIVE DIAGNOSIS: Same.  PROCEDURE: Closed reduction and percutaneous pinning of right hip, femoral neck fracture.  SURGEON: Geralynn Rile, M.D.  ASSIST: none,  ANESTHESIA: general  IV FLUIDS AND URINE: See anesthesia.  ESTIMATED BLOOD LOSS: 10 mL.  IMPLANTS: 8.0 mm cannulated screws Biomet 2 partial threaded 1 fully threaded  DRAINS: None.  COMPLICATIONS: None.  DESCRIPTION OF PROCEDURE: The patient was brought to the operating room and placed supine on the operating table.  The patient had been signed prior to the procedure and this was documented. The patient had the anesthesia placed by the anesthesiologist.  The prep verification and incision time-outs were performed to confirm that this was the correct patient, site, side and location. The patient had SCDs in place. The patient did receive antibiotics prior to the incision and was redosed during the procedure as needed at indicated intervals.  The patient's well leg was placed in a flexed lithotomy position and carefully padded.  The operative leg was placed in traction and also well-padded.  Care was taken to confirm radiographs before prepping and draping. The hip was prepped and draped in the standard fashion.  Screws were placed using the following technique.  The 2.69mm guide pin was first placed and confirmed to be in the proper location on both AP and lateral views. After the guidewire was placed, the skin incision was made over the guidewire, then the 4.5 mm cannulated drill was started over the wire.  The  drill was used to drill the bony corridor, again confirming during drilling on both views. The final cannulated screw guidewire was then placed and again confirmed in position on both views.  The measuring stick was used to measure the length of screws.  This was repeated for all screws.  The approach-withdraw phenomenon was visualized under live fluoroscopy to confirm that all screws were of the appropriate length and in the head.   Of note, during the orthopedic procedure, the bone quality was found to be very poor. All wounds were copiously irrigated with saline and then the skin was reapproximated with absorbable buried sutures. The wounds were cleaned and dried a final time and a sterile dressing. The patient was then transferred back to the bed and left the operating room in stable condition.  All sponge and instrument counts were correct.  POSTOPERATIVE PLAN: [name] will be weight bearing as tolerated; he will return for wound check in 2 weeks. [name] will receive DVT prophylaxis based on other medications, activity level, and risk ratio of bleeding to thrombosis per the primary team; if possible, we would prefer aspirin bid.

## 2016-03-29 NOTE — Anesthesia Postprocedure Evaluation (Addendum)
Anesthesia Post Note  Patient: [name]  Procedure(s) Performed: Procedure(s) (LRB): CANNULATED HIP PINNING (Right)  Patient location during evaluation: PACU Anesthesia Type: General Level of consciousness: awake and alert Pain management: pain level controlled Vital Signs Assessment: post-procedure vital signs reviewed and stable Respiratory status: spontaneous breathing, nonlabored ventilation, respiratory function stable and patient connected to nasal cannula oxygen Cardiovascular status: blood pressure returned to baseline and stable Postop Assessment: no signs of nausea or vomiting Anesthetic complications: no       Last Vitals:  Vitals:   03/29/16 0437 03/29/16 1400  BP: (!) 104/56 (!) 155/73  Pulse: 69 70  Resp: 20 20  Temp: 36.4 C 36.4 C    Last Pain:  Vitals:   03/29/16 1400  TempSrc:   PainSc: Asleep                 Shanyce Daris S

## 2016-03-29 NOTE — H&P (Signed)
H&P update  The surgical history has been reviewed and remains accurate without interval change.  The patient was re-examined and patient's physiologic condition has not changed significantly in the last 30 days. The condition still exists that makes this procedure necessary. The treatment plan remains the same, without new options for care.  No new pharmacological allergies or types of therapy has been initiated that would change the plan or the appropriateness of the plan.  The patient and/or family understand the potential benefits and risks.  Akul Leggette P. Stann Mainland, MD 03/29/2016 12:09 PM

## 2016-03-29 NOTE — Transfer of Care (Signed)
Immediate Anesthesia Transfer of Care Note  Patient: [name]  Procedure(s) Performed: Procedure(s): CANNULATED HIP PINNING (Right)  Patient Location: PACU  Anesthesia Type:General  Level of Consciousness: awake, alert  and oriented  Airway & Oxygen Therapy: Patient Spontanous Breathing and Patient connected to face mask oxygen  Post-op Assessment: Report given to RN and Post -op Vital signs reviewed and stable  Post vital signs: Reviewed and stable  Last Vitals:  Vitals:   03/29/16 0100 03/29/16 0437  BP: (!) 107/59 (!) 104/56  Pulse: 91 69  Resp: (!) 22 20  Temp: 36.7 C 36.4 C    Last Pain:  Vitals:   03/29/16 0745  TempSrc:   PainSc: Asleep         Complications: No apparent anesthesia complications

## 2016-03-29 NOTE — Progress Notes (Signed)
[facility] PROGRESS NOTE    Progress Note  [name]  [id number] DOB: 07-09-1921 DOA: 03/27/2016 PCP: Jenny Reichmann, MD     Brief Narrative:   [name] is an 83 y.o. male past medical history of essential hypertension, chronic atrial fibrillation on Eluquis, with combined systolic and diastolic heart failure fell this afternoon was found by his son landing on his right side, x-ray in the ED showed a right subcapital hip fracture  Assessment/Plan:   Closed right hip fracture, initial encounter [facility]): Hold Eluquis, continue beta blocker and ACE inhibitor. Surgical intervention cannot be accomplished on 03/28/2016, orthopedic surgery and proceed with surgical intervention on 03/29/2016.  Essential hypertension Blood pressure seems to be stable continue beta blocker, hold ACE inhibitor 24 hours prior to surgery along with diuretics.  CKD (chronic kidney disease) stage 2, GFR 60-89 ml/min Baseline will continue monitor.  Paroxysmal atrial fibrillation (HCC) Now insinus rhythm. Continue hold Eluquis.   DVT prophylaxis: lovenox Family Communication:none Disposition Plan/Barrier to D/C: SNF in 2 days Code Status:     Code Status Orders        Start     Ordered   03/27/16 2350  Full code  Continuous     03/27/16 2349    Code Status History    Date Active Date Inactive Code Status Order ID Comments User Context   11/09/2015  5:31 PM 11/16/2015  7:25 PM Full Code [id number]  [name], MD Inpatient   11/03/2015  5:39 PM 11/09/2015  5:31 PM Full Code [id number]  [name], MD ED   11/30/2013  6:50 PM 12/04/2013  7:21 PM Full Code [id number]  [name], MD Inpatient   11/30/2013 12:46 PM 11/30/2013  6:50 PM Full Code [id number]  [name], MD Inpatient   07/09/2013  1:13 AM 07/15/2013  5:00 PM Full Code [id number]  [name], MD Inpatient    Advance Directive Documentation   Flowsheet Row Most Recent Value  Type of Advance Directive  Healthcare Power of  Attorney, Living will  Pre-existing out of facility DNR order (yellow form or pink MOST form)  No data  "MOST" Form in Place?  No data        IV Access:    Peripheral IV   Procedures and diagnostic studies:   Dg Chest 1 View  Result Date: 03/27/2016 CLINICAL DATA:  Chest pain after fall.  Preop. EXAM: CHEST 1 VIEW COMPARISON:  None. FINDINGS: Stable cardiomegaly with aortic atherosclerosis. Diffuse calcified pleural plaque with mild interstitial edema. No pneumonic consolidation or pneumothorax. Chronic degenerative change of both shoulders. IMPRESSION: Stable cardiomegaly with aortic atherosclerosis. Calcified pleural plaque consistent with prior asbestos exposure. Mild interstitial edema. No pneumonic consolidations. Electronically Signed   By: Ashley Royalty M.D.   On: 03/27/2016 19:05   Ct Hip Right Wo Contrast  Result Date: 03/27/2016 CLINICAL DATA:  Right subcapital femoral neck fracture EXAM: CT OF THE RIGHT HIP WITHOUT CONTRAST TECHNIQUE: Multidetector CT imaging of the right hip was performed according to the standard protocol. Multiplanar CT image reconstructions were also generated. COMPARISON:  Same day radiographs from 03/27/2016 FINDINGS: Bones/Joint/Cartilage There is an impacted subcapital right femoral neck fracture along its superolateral aspect. Slight joint space narrowing the native right hip. No acetabular or pubic rami fracture. Ligaments Suboptimally assessed by CT. Muscles and Tendons Nonacute Soft tissues Small joint effusion. No subcutaneous contusion over the lateral aspect of the right hip. IMPRESSION: Superolateral subcapital femoral neck fracture with impaction  of the femoral head. No acetabular fracture identified. Electronically Signed   By: Ashley Royalty M.D.   On: 03/27/2016 21:45   Dg Hip Unilat W Or Wo Pelvis 2-3 Views Right  Result Date: 03/27/2016 CLINICAL DATA:  Fall.  Right hip pain. EXAM: DG HIP (WITH OR WITHOUT PELVIS) 2-3V RIGHT COMPARISON:  None.  FINDINGS: The bones appear diffusely osteopenic. An impacted subcapital right femoral neck fracture is identified. The right femoral head appears located. Previous left hip arthroplasty. IMPRESSION: 1. Impacted right subcapital femoral neck fracture. Electronically Signed   By: Kerby Moors M.D.   On: 03/27/2016 19:04     Medical Consultants:    None.  Anti-Infectives:   none  Subjective:    [name] pain is controlled.  Objective:    Vitals:   03/28/16 1400 03/28/16 2140 03/29/16 0100 03/29/16 0437  BP: (!) 116/58 (!) 121/58 (!) 107/59 (!) 104/56  Pulse: 68 65 91 69  Resp: 20 20 (!) 22 20  Temp: 98.1 F (36.7 C) 98.1 F (36.7 C) 98 F (36.7 C) 97.6 F (36.4 C)  TempSrc: Oral Oral Oral Oral  SpO2: 98% 92% (!) 73% 92%  Weight:      Height:        Intake/Output Summary (Last 24 hours) at 03/29/16 1126 Last data filed at 03/29/16 0443  Gross per 24 hour  Intake                0 ml  Output              875 ml  Net             -875 ml   Filed Weights   03/27/16 2351  Weight: 61.5 kg (135 lb 9.3 oz)    Exam: General exam: In no acute distress. Respiratory system: Good air movement and clear to auscultation. Cardiovascular system: S1 & S2 heard, RRR. No JVD. Gastrointestinal system: Abdomen is nondistended, soft and nontender.  Skin: No rashes, lesions or ulcers Psychiatry: Judgement and insight appear normal. Mood & affect appropriate.    Data Reviewed:    Labs: Basic Metabolic Panel:  Recent Labs Lab 03/27/16 1852 03/28/16 0530  NA 142 140  K 4.5 3.6  CL 106 104  CO2 28 29  GLUCOSE 124* 125*  BUN 35* 31*  CREATININE 1.13 1.26*  CALCIUM 8.8* 8.6*   GFR Estimated Creatinine Clearance: 32.5 mL/min (by C-G formula based on SCr of 1.26 mg/dL (H)). Liver Function Tests: No results for input(s): AST, ALT, ALKPHOS, BILITOT, PROT, ALBUMIN in the last 168 hours. No results for input(s): LIPASE, AMYLASE in the last 168 hours. No results for  input(s): AMMONIA in the last 168 hours. Coagulation profile  Recent Labs Lab 03/27/16 1852  INR 0.98    CBC:  Recent Labs Lab 03/27/16 1852 03/28/16 0530  WBC 12.9* 10.7*  HGB 12.5* 12.4*  HCT 37.7* 36.3*  MCV 88.9 88.5  PLT 194 210   Cardiac Enzymes: No results for input(s): CKTOTAL, CKMB, CKMBINDEX, TROPONINI in the last 168 hours. BNP (last 3 results) No results for input(s): PROBNP in the last 8760 hours. CBG: No results for input(s): GLUCAP in the last 168 hours. D-Dimer: No results for input(s): DDIMER in the last 72 hours. Hgb A1c: No results for input(s): HGBA1C in the last 72 hours. Lipid Profile: No results for input(s): CHOL, HDL, LDLCALC, TRIG, CHOLHDL, LDLDIRECT in the last 72 hours. Thyroid function studies: No results for input(s): TSH,  T4TOTAL, T3FREE, THYROIDAB in the last 72 hours.  Invalid input(s): FREET3 Anemia work up: No results for input(s): VITAMINB12, FOLATE, FERRITIN, TIBC, IRON, RETICCTPCT in the last 72 hours. Sepsis Labs:  Recent Labs Lab 03/27/16 1852 03/28/16 0530  WBC 12.9* 10.7*   Microbiology Recent Results (from the past 240 hour(s))  Surgical pcr screen     Status: None   Collection Time: 03/28/16 12:18 AM  Result Value Ref Range Status   MRSA, PCR NEGATIVE NEGATIVE Final   Staphylococcus aureus NEGATIVE NEGATIVE Final    Comment:        The Xpert SA Assay (FDA approved for NASAL specimens in patients over 51 years of age), is one component of a comprehensive surveillance program.  Test performance has been validated by [facility] for patients greater than or equal to 14 year old. It is not intended to diagnose infection nor to guide or monitor treatment.      Medications:   . acidophilus  1 capsule Oral Daily  . [START ON 03/30/2016] chlorhexidine   Topical Once  . docusate sodium  100 mg Oral BID  . feeding supplement (ENSURE ENLIVE)  237 mL Oral TID WC  . metoprolol succinate  25 mg Oral Q breakfast    . multivitamin  1 tablet Oral BID  . pantoprazole  40 mg Oral Daily  . potassium chloride  10 mEq Oral Daily  . simvastatin  20 mg Oral QHS  . tamsulosin  0.4 mg Oral QPC supper   Continuous Infusions:  Time spent: 15 min   LOS: 2 days   Charlynne Cousins  [facility] Pager [phone]  *Please refer to [email or web address], password [email or web address] to get updated schedule on who will round on this patient, as hospitalists switch teams weekly. If 7PM-7AM, please contact night-coverage at [email or web address], password [email or web address] for any overnight needs.  03/29/2016, 11:26 AM

## 2016-03-29 NOTE — Progress Notes (Signed)
[facility] PROGRESS NOTE    Progress Note  CABOT WINDHORST  [id number] DOB: 1922-01-13 DOA: 03/27/2016 PCP: Jenny Reichmann, MD     Brief Narrative:   [name] is an 83 y.o. male past medical history of essential hypertension, chronic atrial fibrillation on Eluquis, with combined systolic and diastolic heart failure fell this afternoon was found by his son landing on his right side, x-ray in the ED showed a right subcapital hip fracture  Assessment/Plan:   Closed right hip fracture, initial encounter [facility]): Hold Eluquis, continue beta blocker and ACE inhibitor. Dr. Stann Mainland was contacted by the ED which recommended intervention 1 03/28/2016.  Essential hypertension Blood pressure seems to be stable continue beta blocker, hold ACE inhibitor 24 hours prior to surgery along with diuretics.  CKD (chronic kidney disease) stage 2, GFR 60-89 ml/min Baseline will continue monitor.  Paroxysmal atrial fibrillation (HCC) Now insinus rhythm.  Severe protein caloric malnutrition: Ensure TID.   DVT prophylaxis: lovenox Family Communication:none Disposition Plan/Barrier to D/C: SNF in 1 days Code Status:     Code Status Orders        Start     Ordered   03/27/16 2350  Full code  Continuous     03/27/16 2349    Code Status History    Date Active Date Inactive Code Status Order ID Comments User Context   11/09/2015  5:31 PM 11/16/2015  7:25 PM Full Code [id number]  [name], MD Inpatient   11/03/2015  5:39 PM 11/09/2015  5:31 PM Full Code [id number]  [name], MD ED   11/30/2013  6:50 PM 12/04/2013  7:21 PM Full Code [id number]  [name], MD Inpatient   11/30/2013 12:46 PM 11/30/2013  6:50 PM Full Code [id number]  [name], MD Inpatient   07/09/2013  1:13 AM 07/15/2013  5:00 PM Full Code [id number]  [name], MD Inpatient    Advance Directive Documentation   Flowsheet Row Most Recent Value  Type of Advance Directive  Healthcare Power of Attorney, Living will    Pre-existing out of facility DNR order (yellow form or pink MOST form)  No data  "MOST" Form in Place?  No data        IV Access:    Peripheral IV   Procedures and diagnostic studies:   Dg Chest 1 View  Result Date: 03/27/2016 CLINICAL DATA:  Chest pain after fall.  Preop. EXAM: CHEST 1 VIEW COMPARISON:  None. FINDINGS: Stable cardiomegaly with aortic atherosclerosis. Diffuse calcified pleural plaque with mild interstitial edema. No pneumonic consolidation or pneumothorax. Chronic degenerative change of both shoulders. IMPRESSION: Stable cardiomegaly with aortic atherosclerosis. Calcified pleural plaque consistent with prior asbestos exposure. Mild interstitial edema. No pneumonic consolidations. Electronically Signed   By: Ashley Royalty M.D.   On: 03/27/2016 19:05   Ct Hip Right Wo Contrast  Result Date: 03/27/2016 CLINICAL DATA:  Right subcapital femoral neck fracture EXAM: CT OF THE RIGHT HIP WITHOUT CONTRAST TECHNIQUE: Multidetector CT imaging of the right hip was performed according to the standard protocol. Multiplanar CT image reconstructions were also generated. COMPARISON:  Same day radiographs from 03/27/2016 FINDINGS: Bones/Joint/Cartilage There is an impacted subcapital right femoral neck fracture along its superolateral aspect. Slight joint space narrowing the native right hip. No acetabular or pubic rami fracture. Ligaments Suboptimally assessed by CT. Muscles and Tendons Nonacute Soft tissues Small joint effusion. No subcutaneous contusion over the lateral aspect of the right hip. IMPRESSION: Superolateral subcapital femoral neck fracture with  impaction of the femoral head. No acetabular fracture identified. Electronically Signed   By: Ashley Royalty M.D.   On: 03/27/2016 21:45   Dg Hip Unilat W Or Wo Pelvis 2-3 Views Right  Result Date: 03/27/2016 CLINICAL DATA:  Fall.  Right hip pain. EXAM: DG HIP (WITH OR WITHOUT PELVIS) 2-3V RIGHT COMPARISON:  None. FINDINGS: The bones appear  diffusely osteopenic. An impacted subcapital right femoral neck fracture is identified. The right femoral head appears located. Previous left hip arthroplasty. IMPRESSION: 1. Impacted right subcapital femoral neck fracture. Electronically Signed   By: Kerby Moors M.D.   On: 03/27/2016 19:04     Medical Consultants:    None.  Anti-Infectives:   none  Subjective:    [name] pain is controlled.  Objective:    Vitals:   03/28/16 1400 03/28/16 2140 03/29/16 0100 03/29/16 0437  BP: (!) 116/58 (!) 121/58 (!) 107/59 (!) 104/56  Pulse: 68 65 91 69  Resp: 20 20 (!) 22 20  Temp: 98.1 F (36.7 C) 98.1 F (36.7 C) 98 F (36.7 C) 97.6 F (36.4 C)  TempSrc: Oral Oral Oral Oral  SpO2: 98% 92% (!) 73% 92%  Weight:      Height:        Intake/Output Summary (Last 24 hours) at 03/29/16 1128 Last data filed at 03/29/16 0443  Gross per 24 hour  Intake                0 ml  Output              875 ml  Net             -875 ml   Filed Weights   03/27/16 2351  Weight: 61.5 kg (135 lb 9.3 oz)    Exam: General exam: In no acute distress. Respiratory system: Good air movement and clear to auscultation. Cardiovascular system: S1 & S2 heard, RRR. No JVD. Gastrointestinal system: Abdomen is nondistended, soft and nontender.  Skin: No rashes, lesions or ulcers Psychiatry: Judgement and insight appear normal. Mood & affect appropriate.    Data Reviewed:    Labs: Basic Metabolic Panel:  Recent Labs Lab 03/27/16 1852 03/28/16 0530  NA 142 140  K 4.5 3.6  CL 106 104  CO2 28 29  GLUCOSE 124* 125*  BUN 35* 31*  CREATININE 1.13 1.26*  CALCIUM 8.8* 8.6*   GFR Estimated Creatinine Clearance: 32.5 mL/min (by C-G formula based on SCr of 1.26 mg/dL (H)). Liver Function Tests: No results for input(s): AST, ALT, ALKPHOS, BILITOT, PROT, ALBUMIN in the last 168 hours. No results for input(s): LIPASE, AMYLASE in the last 168 hours. No results for input(s): AMMONIA in the  last 168 hours. Coagulation profile  Recent Labs Lab 03/27/16 1852  INR 0.98    CBC:  Recent Labs Lab 03/27/16 1852 03/28/16 0530  WBC 12.9* 10.7*  HGB 12.5* 12.4*  HCT 37.7* 36.3*  MCV 88.9 88.5  PLT 194 210   Cardiac Enzymes: No results for input(s): CKTOTAL, CKMB, CKMBINDEX, TROPONINI in the last 168 hours. BNP (last 3 results) No results for input(s): PROBNP in the last 8760 hours. CBG: No results for input(s): GLUCAP in the last 168 hours. D-Dimer: No results for input(s): DDIMER in the last 72 hours. Hgb A1c: No results for input(s): HGBA1C in the last 72 hours. Lipid Profile: No results for input(s): CHOL, HDL, LDLCALC, TRIG, CHOLHDL, LDLDIRECT in the last 72 hours. Thyroid function studies: No results for input(s):  TSH, T4TOTAL, T3FREE, THYROIDAB in the last 72 hours.  Invalid input(s): FREET3 Anemia work up: No results for input(s): VITAMINB12, FOLATE, FERRITIN, TIBC, IRON, RETICCTPCT in the last 72 hours. Sepsis Labs:  Recent Labs Lab 03/27/16 1852 03/28/16 0530  WBC 12.9* 10.7*   Microbiology Recent Results (from the past 240 hour(s))  Surgical pcr screen     Status: None   Collection Time: 03/28/16 12:18 AM  Result Value Ref Range Status   MRSA, PCR NEGATIVE NEGATIVE Final   Staphylococcus aureus NEGATIVE NEGATIVE Final    Comment:        The Xpert SA Assay (FDA approved for NASAL specimens in patients over 39 years of age), is one component of a comprehensive surveillance program.  Test performance has been validated by [facility] for patients greater than or equal to 66 year old. It is not intended to diagnose infection nor to guide or monitor treatment.      Medications:   . acidophilus  1 capsule Oral Daily  . [START ON 03/30/2016] chlorhexidine   Topical Once  . docusate sodium  100 mg Oral BID  . feeding supplement (ENSURE ENLIVE)  237 mL Oral TID WC  . metoprolol succinate  25 mg Oral Q breakfast  . multivitamin  1 tablet  Oral BID  . pantoprazole  40 mg Oral Daily  . potassium chloride  10 mEq Oral Daily  . simvastatin  20 mg Oral QHS  . tamsulosin  0.4 mg Oral QPC supper   Continuous Infusions:  Time spent: 15 min   LOS: 2 days   Charlynne Cousins  [facility] Pager [phone]  *Please refer to [email or web address], password [email or web address] to get updated schedule on who will round on this patient, as hospitalists switch teams weekly. If 7PM-7AM, please contact night-coverage at [email or web address], password [email or web address] for any overnight needs.  03/29/2016, 11:28 AM

## 2016-03-29 NOTE — Anesthesia Procedure Notes (Signed)
Procedure Name: Intubation Date/Time: 03/29/2016 12:49 PM Performed by: Glory Buff Pre-anesthesia Checklist: Patient identified, Emergency Drugs available, Suction available and Patient being monitored Patient Re-evaluated:Patient Re-evaluated prior to inductionOxygen Delivery Method: Circle system utilized Preoxygenation: Pre-oxygenation with 100% oxygen Intubation Type: IV induction Ventilation: Mask ventilation without difficulty Laryngoscope Size: 3 Grade View: Grade I Tube type: Oral Tube size: 7.5 mm Number of attempts: 1 Airway Equipment and Method: Stylet and Oral airway Placement Confirmation: ETT inserted through vocal cords under direct vision,  positive ETCO2 and breath sounds checked- equal and bilateral Secured at: 21 cm Tube secured with: Tape Dental Injury: Teeth and Oropharynx as per pre-operative assessment

## 2016-03-30 DIAGNOSIS — E43 Unspecified severe protein-calorie malnutrition: Secondary | ICD-10-CM

## 2016-03-30 DIAGNOSIS — R627 Adult failure to thrive: Secondary | ICD-10-CM | POA: Diagnosis not present

## 2016-03-30 DIAGNOSIS — E860 Dehydration: Secondary | ICD-10-CM | POA: Diagnosis not present

## 2016-03-30 DIAGNOSIS — I5043 Acute on chronic combined systolic (congestive) and diastolic (congestive) heart failure: Secondary | ICD-10-CM | POA: Diagnosis not present

## 2016-03-30 DIAGNOSIS — N179 Acute kidney failure, unspecified: Secondary | ICD-10-CM | POA: Diagnosis not present

## 2016-03-30 DIAGNOSIS — H9190 Unspecified hearing loss, unspecified ear: Secondary | ICD-10-CM | POA: Diagnosis not present

## 2016-03-30 DIAGNOSIS — N400 Enlarged prostate without lower urinary tract symptoms: Secondary | ICD-10-CM | POA: Diagnosis not present

## 2016-03-30 DIAGNOSIS — R6521 Severe sepsis with septic shock: Secondary | ICD-10-CM | POA: Diagnosis not present

## 2016-03-30 DIAGNOSIS — M25552 Pain in left hip: Secondary | ICD-10-CM | POA: Diagnosis not present

## 2016-03-30 DIAGNOSIS — J189 Pneumonia, unspecified organism: Secondary | ICD-10-CM | POA: Diagnosis not present

## 2016-03-30 DIAGNOSIS — M6281 Muscle weakness (generalized): Secondary | ICD-10-CM | POA: Diagnosis not present

## 2016-03-30 DIAGNOSIS — E039 Hypothyroidism, unspecified: Secondary | ICD-10-CM | POA: Diagnosis not present

## 2016-03-30 DIAGNOSIS — R2681 Unsteadiness on feet: Secondary | ICD-10-CM | POA: Diagnosis not present

## 2016-03-30 DIAGNOSIS — I10 Essential (primary) hypertension: Secondary | ICD-10-CM | POA: Diagnosis not present

## 2016-03-30 DIAGNOSIS — Z66 Do not resuscitate: Secondary | ICD-10-CM | POA: Diagnosis not present

## 2016-03-30 DIAGNOSIS — J9621 Acute and chronic respiratory failure with hypoxia: Secondary | ICD-10-CM | POA: Diagnosis not present

## 2016-03-30 DIAGNOSIS — S72009A Fracture of unspecified part of neck of unspecified femur, initial encounter for closed fracture: Secondary | ICD-10-CM | POA: Diagnosis not present

## 2016-03-30 DIAGNOSIS — I482 Chronic atrial fibrillation: Secondary | ICD-10-CM | POA: Diagnosis not present

## 2016-03-30 DIAGNOSIS — J9611 Chronic respiratory failure with hypoxia: Secondary | ICD-10-CM | POA: Diagnosis not present

## 2016-03-30 DIAGNOSIS — Z5189 Encounter for other specified aftercare: Secondary | ICD-10-CM | POA: Diagnosis not present

## 2016-03-30 DIAGNOSIS — I429 Cardiomyopathy, unspecified: Secondary | ICD-10-CM | POA: Diagnosis not present

## 2016-03-30 DIAGNOSIS — I4891 Unspecified atrial fibrillation: Secondary | ICD-10-CM | POA: Diagnosis not present

## 2016-03-30 DIAGNOSIS — Z87891 Personal history of nicotine dependence: Secondary | ICD-10-CM | POA: Diagnosis not present

## 2016-03-30 DIAGNOSIS — J069 Acute upper respiratory infection, unspecified: Secondary | ICD-10-CM | POA: Diagnosis not present

## 2016-03-30 DIAGNOSIS — S72001D Fracture of unspecified part of neck of right femur, subsequent encounter for closed fracture with routine healing: Secondary | ICD-10-CM | POA: Diagnosis not present

## 2016-03-30 DIAGNOSIS — E8770 Fluid overload, unspecified: Secondary | ICD-10-CM | POA: Diagnosis not present

## 2016-03-30 DIAGNOSIS — R1314 Dysphagia, pharyngoesophageal phase: Secondary | ICD-10-CM | POA: Diagnosis not present

## 2016-03-30 DIAGNOSIS — I480 Paroxysmal atrial fibrillation: Secondary | ICD-10-CM | POA: Diagnosis not present

## 2016-03-30 DIAGNOSIS — N184 Chronic kidney disease, stage 4 (severe): Secondary | ICD-10-CM | POA: Diagnosis not present

## 2016-03-30 DIAGNOSIS — J810 Acute pulmonary edema: Secondary | ICD-10-CM | POA: Diagnosis not present

## 2016-03-30 DIAGNOSIS — K219 Gastro-esophageal reflux disease without esophagitis: Secondary | ICD-10-CM | POA: Diagnosis not present

## 2016-03-30 DIAGNOSIS — S72002A Fracture of unspecified part of neck of left femur, initial encounter for closed fracture: Secondary | ICD-10-CM | POA: Diagnosis not present

## 2016-03-30 DIAGNOSIS — J9601 Acute respiratory failure with hypoxia: Secondary | ICD-10-CM | POA: Diagnosis not present

## 2016-03-30 DIAGNOSIS — E785 Hyperlipidemia, unspecified: Secondary | ICD-10-CM | POA: Diagnosis not present

## 2016-03-30 DIAGNOSIS — R278 Other lack of coordination: Secondary | ICD-10-CM | POA: Diagnosis not present

## 2016-03-30 DIAGNOSIS — R031 Nonspecific low blood-pressure reading: Secondary | ICD-10-CM | POA: Diagnosis not present

## 2016-03-30 DIAGNOSIS — L8990 Pressure ulcer of unspecified site, unspecified stage: Secondary | ICD-10-CM | POA: Diagnosis not present

## 2016-03-30 DIAGNOSIS — R05 Cough: Secondary | ICD-10-CM | POA: Diagnosis not present

## 2016-03-30 DIAGNOSIS — R2689 Other abnormalities of gait and mobility: Secondary | ICD-10-CM | POA: Diagnosis not present

## 2016-03-30 DIAGNOSIS — S72001S Fracture of unspecified part of neck of right femur, sequela: Secondary | ICD-10-CM | POA: Diagnosis not present

## 2016-03-30 DIAGNOSIS — Y95 Nosocomial condition: Secondary | ICD-10-CM | POA: Diagnosis present

## 2016-03-30 DIAGNOSIS — S72001A Fracture of unspecified part of neck of right femur, initial encounter for closed fracture: Secondary | ICD-10-CM | POA: Diagnosis not present

## 2016-03-30 DIAGNOSIS — S72091D Other fracture of head and neck of right femur, subsequent encounter for closed fracture with routine healing: Secondary | ICD-10-CM | POA: Diagnosis not present

## 2016-03-30 DIAGNOSIS — Z86010 Personal history of colonic polyps: Secondary | ICD-10-CM | POA: Diagnosis not present

## 2016-03-30 DIAGNOSIS — I130 Hypertensive heart and chronic kidney disease with heart failure and stage 1 through stage 4 chronic kidney disease, or unspecified chronic kidney disease: Secondary | ICD-10-CM | POA: Diagnosis not present

## 2016-03-30 DIAGNOSIS — D72829 Elevated white blood cell count, unspecified: Secondary | ICD-10-CM | POA: Diagnosis not present

## 2016-03-30 DIAGNOSIS — S72091S Other fracture of head and neck of right femur, sequela: Secondary | ICD-10-CM | POA: Diagnosis not present

## 2016-03-30 DIAGNOSIS — Z4789 Encounter for other orthopedic aftercare: Secondary | ICD-10-CM | POA: Diagnosis not present

## 2016-03-30 DIAGNOSIS — N183 Chronic kidney disease, stage 3 (moderate): Secondary | ICD-10-CM | POA: Diagnosis not present

## 2016-03-30 DIAGNOSIS — A419 Sepsis, unspecified organism: Secondary | ICD-10-CM | POA: Diagnosis not present

## 2016-03-30 DIAGNOSIS — I5023 Acute on chronic systolic (congestive) heart failure: Secondary | ICD-10-CM | POA: Diagnosis not present

## 2016-03-30 DIAGNOSIS — R5381 Other malaise: Secondary | ICD-10-CM | POA: Diagnosis not present

## 2016-03-30 DIAGNOSIS — B974 Respiratory syncytial virus as the cause of diseases classified elsewhere: Secondary | ICD-10-CM | POA: Diagnosis present

## 2016-03-30 DIAGNOSIS — Z96642 Presence of left artificial hip joint: Secondary | ICD-10-CM | POA: Diagnosis not present

## 2016-03-30 DIAGNOSIS — Z515 Encounter for palliative care: Secondary | ICD-10-CM | POA: Diagnosis not present

## 2016-03-30 DIAGNOSIS — D62 Acute posthemorrhagic anemia: Secondary | ICD-10-CM | POA: Diagnosis not present

## 2016-03-30 DIAGNOSIS — R531 Weakness: Secondary | ICD-10-CM | POA: Diagnosis not present

## 2016-03-30 DIAGNOSIS — N182 Chronic kidney disease, stage 2 (mild): Secondary | ICD-10-CM | POA: Diagnosis not present

## 2016-03-30 DIAGNOSIS — R0902 Hypoxemia: Secondary | ICD-10-CM | POA: Diagnosis present

## 2016-03-30 DIAGNOSIS — R259 Unspecified abnormal involuntary movements: Secondary | ICD-10-CM | POA: Diagnosis not present

## 2016-03-30 MED ORDER — SODIUM CHLORIDE 0.9 % IV BOLUS (SEPSIS): 500.0000 mL | Freq: Once | INTRAVENOUS | Status: DC

## 2016-03-30 MED ORDER — TRAMADOL-ACETAMINOPHEN 37.5-325 MG PO TABS: 1.0000 | ORAL_TABLET | Freq: Four times a day (QID) | ORAL | 0 refills | Status: DC | PRN

## 2016-03-30 MED ORDER — LISINOPRIL-HYDROCHLOROTHIAZIDE 10-12.5 MG PO TABS: 1.0000 | ORAL_TABLET | Freq: Every day | ORAL | Status: DC

## 2016-03-30 MED ORDER — SODIUM CHLORIDE 0.9 % IV BOLUS (SEPSIS)
1000.0000 mL | Freq: Once | INTRAVENOUS | Status: AC
Start: 2016-03-30 — End: 2016-03-30
  Administered 2016-03-30: 1000 mL via INTRAVENOUS

## 2016-03-30 NOTE — Clinical Social Work Placement (Signed)
Patient has bed at [facility], [place].   CLINICAL SOCIAL WORK PLACEMENT  NOTE  Date:  03/30/2016  Patient Details  Name: [name] MRN: [id number] Date of Birth: 10-03-1921  Clinical Social Work is seeking post-discharge placement for this patient at the [place] level of care (*CSW will initial, date and re-position this form in  chart as items are completed):  Yes   Patient/family provided with [place] Work Department's list of facilities offering this level of care within the geographic area requested by the patient (or if unable, by the patient's family).  Yes   Patient/family informed of their freedom to choose among providers that offer the needed level of care, that participate in Medicare, Medicaid or managed care program needed by the patient, have an available bed and are willing to accept the patient.  Yes   Patient/family informed of [facility]'s ownership interest in [facility] and [facility], as well as of the fact that they are under no obligation to receive care at these facilities.  PASRR submitted to EDS on 03/30/16     PASRR number received on       Existing PASRR number confirmed on 03/30/16     FL2 transmitted to all facilities in geographic area requested by pt/family on 03/30/16     FL2 transmitted to all facilities within larger geographic area on       Patient informed that his/her managed care company has contracts with or will negotiate with certain facilities, including the following:        Yes   Patient/family informed of bed offers received.  Patient chooses bed at  [facility] and [place] )     Physician recommends and patient chooses bed at      Patient to be transferred to  [facility] [place] ) on  .  Patient to be transferred to facility by  [facility] )     Patient family notified on 03/30/16 of transfer.  Name of family member notified:   (Pt's son, Marya Amsler )      PHYSICIAN Please prepare priority discharge summary, including medications, Please sign FL2     Additional Comment:    _______________________________________________ Glendon Axe A 03/30/2016, 12:51 PM

## 2016-03-30 NOTE — Clinical Social Work Note (Signed)
Clinical Social Work Assessment  Patient Details  Name: [name] MRN: [id number] Date of Birth: 01-07-1922  Date of referral:  03/30/16               Reason for consult:  Facility Placement, Discharge Planning                Permission sought to share information with:  Family Supports, Case Manager Permission granted to share information::  Yes, Verbal Permission Granted  Name::      Resa Miner)  Agency::   ([facility])  Relationship::   (Son)  Contact Information:   [phone])  Housing/Transportation Living arrangements for the past 2 months:  [place] of Information:  Patient, Adult Children Patient Interpreter Needed:  None Criminal Activity/Legal Involvement Pertinent to Current Situation/Hospitalization:  No - Comment as needed Significant Relationships:  Adult Children Lives with:  Self Do you feel safe going back to the place where you live?  No Need for family participation in patient care:  No (Coment)  Care giving concerns:  Patient admitted from home and now requiring STR at skilled level.    Social Worker assessment / plan: Patient requiring STR at skilled level and requesting placement at [facility] and [place]. Patient has been to [facility] in the past and wishes to return. MSW contacted pt's son, Marya Amsler who is also agreeable to SNF placement at [facility]. Patient to complete ppwk with facility representative. No further concerns reported at this time. MSW remains available as needed.   Employment status:  Retired Forensic scientist:  Medicare PT Recommendations:  [place] / Referral to community resources:  [place]  Patient/Family's Response to care:  Patient a/o x4. Patient and family agreeable to SNF placement, prefers [facility]. Patient and son, Marya Amsler pleasant and appreciated social work intervention.   Patient/Family's Understanding of and Emotional  Response to Diagnosis, Current Treatment, and Prognosis:  Pt's son aware of medical interventions and discharge planning.   Emotional Assessment Appearance:  Appears stated age Attitude/Demeanor/Rapport:   (Pleasant ) Affect (typically observed):  Accepting, Appropriate, Pleasant Orientation:  Oriented to Situation, Oriented to  Time, Oriented to Place, Oriented to Self Alcohol / Substance use:  Not Applicable Psych involvement (Current and /or in the community):  No (Comment)  Discharge Needs  Concerns to be addressed:  Denies Needs/Concerns at this time Readmission within the last 30 days:  No Current discharge risk:  Dependent with Mobility Barriers to Discharge:  No Barriers Identified   Rozell Searing 03/30/2016, 12:55 PM

## 2016-03-30 NOTE — Clinical Social Work Note (Signed)
Medical Social Worker facilitated patient discharge including contacting patient family and facility to confirm patient discharge plans.  Clinical information faxed to facility and family agreeable with plan.  MSW arranged ambulance transport via [place] to [facility] and [place].  RN to call report prior to discharge.  Medical Social Worker will sign off for now as social work intervention is no longer needed. Please consult [place] again if new need arises.  Glendon Axe, MSW [phone] 03/30/2016 2:29 PM

## 2016-03-30 NOTE — Evaluation (Signed)
Physical Therapy Evaluation Patient Details Name: [name] MRN: [id number] DOB: 1921-05-04 Today's Date: 03/30/2016   History of Present Illness  83 y.o.-year-old male who sustained a hip fracture now s/p R hip pinning due to the valgus impacted nature of the fracture with PMHx of cardiomyopathy, vertebral compression fx, L hip hemiarthroplasty, SBO, essential tremor  Clinical Impression  Patient is s/p above surgery resulting in functional limitations due to the deficits listed below (see PT Problem List).  Patient will benefit from skilled PT to increase their independence and safety with mobility to allow discharge to the venue listed below.  Pt requiring extensive assist for mobility at this time.  Recommend SNF upon d/c.     Follow Up Recommendations SNF    Equipment Recommendations  None recommended by PT    Recommendations for Other Services       Precautions / Restrictions Precautions Precautions: Fall Precaution Comments: very HOH, speak into R ear Restrictions Weight Bearing Restrictions: No Other Position/Activity Restrictions: WBAT R LE      Mobility  Bed Mobility Overal bed mobility: Needs Assistance Bed Mobility: Supine to Sit     Supine to sit: Total assist;+2 for physical assistance     General bed mobility comments: multimodal cues for technique, pt requiring total assist due to pain, guarding however states, "don't mind me, I'm okay."  Transfers Overall transfer level: Needs assistance Equipment used: Rolling walker (2 wheeled) Transfers: Sit to/from [facility] Sit to Stand: Mod assist;+2 physical assistance Stand pivot transfers: Mod assist;+2 physical assistance       General transfer comment: multimodal cues for safe technique, assist to rise, steady and control descent  Ambulation/Gait                Stairs            Wheelchair Mobility    Modified Rankin (Stroke Patients Only)       Balance  Overall balance assessment: History of Falls                                           Pertinent Vitals/Pain Pain Assessment: Faces Faces Pain Scale: Hurts even more Pain Location: R hip with movment Pain Descriptors / Indicators: Grimacing;Guarding Pain Intervention(s): Limited activity within patient's tolerance;Monitored during session;Repositioned (pt declined pain meds)    Home Living Family/patient expects to be discharged to:: Skilled nursing facility Living Arrangements: Children               Additional Comments: pt very HOH, difficulty understanding PLOF questions    Prior Function Level of Independence: Independent with assistive device(s)         Comments: per chart, ambulatory at baseline     Hand Dominance        Extremity/Trunk Assessment        Lower Extremity Assessment Lower Extremity Assessment: RLE deficits/detail;Generalized weakness RLE Deficits / Details: no active movement observed due to pain, assist required for R LE       Communication   Communication: HOH  Cognition Arousal/Alertness: Awake/alert Behavior During Therapy: WFL for tasks assessed/performed Overall Cognitive Status: Within Functional Limits for tasks assessed (seems overall WFL, very HOH)                      General Comments      Exercises     Assessment/Plan  PT Assessment Patient needs continued PT services  PT Problem List Decreased strength;Decreased activity tolerance;Decreased balance;Decreased knowledge of use of DME;Decreased mobility;Pain          PT Treatment Interventions DME instruction;Gait training;Functional mobility training;Balance training;Therapeutic exercise;Therapeutic activities;Patient/family education    PT Goals (Current goals can be found in the Care Plan section)  Acute Rehab PT Goals Patient Stated Goal: pt states he plans to go to [facility] for rehab PT Goal Formulation: With patient Time For  Goal Achievement: 04/06/16 Potential to Achieve Goals: Good    Frequency Min 3X/week   Barriers to discharge        Co-evaluation               End of Session Equipment Utilized During Treatment: Gait belt Activity Tolerance: Patient tolerated treatment well Patient left: in chair;with call bell/phone within reach;with chair alarm set           Time: [id number] PT Time Calculation (min) (ACUTE ONLY): 12 min   Charges:   PT Evaluation $PT Eval Low Complexity: 1 Procedure     PT G Codes:        Asuzena Weis,KATHrine E 03/30/2016, 12:37 PM Carmelia Bake, PT, DPT 03/30/2016 Pager: [phone]

## 2016-03-30 NOTE — NC FL2 (Signed)
[place] LEVEL OF CARE SCREENING TOOL     IDENTIFICATION  Patient Name: [name] Birthdate: 1921/12/16 Sex: male Admission Date (Current Location): 03/27/2016  [facility] and [place] Number:  Herbalist and Address:  [facility],  [address], [place]      Provider Number: [id number]  Attending Physician Name and Address:  Charlynne Cousins, MD  Relative Name and Phone Number:       Current Level of Care: Hospital Recommended Level of Care: [place] Prior Approval Number:    Date Approved/Denied:   PASRR Number:   [id number]  Discharge Plan: Home    Current Diagnoses: Patient Active Problem List   Diagnosis Date Noted  . Closed right hip fracture, initial encounter ([place]) 03/27/2016  . Hip fracture ([place]) 03/27/2016  . Acute on chronic systolic heart failure ([place]) 11/16/2015  . Hypokalemia 11/16/2015  . Hard of hearing 11/16/2015  . 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 Cardiomyopathy ([place])   . Acute respiratory failure ([place])   . Small bowel obstruction from bezoar s/p ex lap & evacuation 11/09/2015 11/03/2015  . Intussusception ([place]) 11/03/2015  . Compression fracture of L2 lumbar vertebra ([place]) 03/16/2015  . Abrasion 03/05/2015  . Lipoma of back 03/01/2015  . Paroxysmal atrial fibrillation ([place]) 12/10/2013  . Hyponatremia 12/03/2013  . Leukocytosis, unspecified 12/02/2013  . Femoral neck fracture ([place]) 11/30/2013  . Acute on chronic renal failure ([place]) 11/30/2013  . Chronic combined systolic and diastolic CHF (congestive heart failure) ([place]) 11/30/2013  . A-fib ([place]) 07/11/2013  . Protein-calorie malnutrition, severe ([place]) 07/10/2013  . Acute encephalopathy 07/10/2013  . CAP (community acquired pneumonia) 07/09/2013  . CKD (chronic kidney disease) stage 2, GFR 60-89 ml/min 07/09/2013  . Metabolic acidosis [id number]  . Septic shock ([place]) 07/09/2013  . BPH  (benign prostatic hyperplasia) 02/04/2012  . MELENA 08/01/2009  . TINEA CORPORIS 06/27/2009  . Hyperlipemia 06/27/2009  . TREMOR, ESSENTIAL 06/27/2009  . Essential hypertension 06/27/2009  . TOBACCO USE, QUIT 06/27/2009  . PERSONAL HX COLONIC POLYPS 02/01/2009  . DIARRHEA 01/31/2009  . GERD 11/19/2007  . BARRETTS ESOPHAGUS 11/19/2007  . GASTRITIS 11/19/2007  . HIATAL HERNIA 11/19/2007  . DIVERTICULOSIS, COLON 11/19/2007    Orientation RESPIRATION BLADDER Height & Weight     Self, Time, Place  O2 ( at 2.5L) Incontinent, External catheter Weight: 135 lb 9.3 oz (61.5 kg) Height:  5\' 5"  (165.1 cm)  BEHAVIORAL SYMPTOMS/MOOD NEUROLOGICAL BOWEL NUTRITION STATUS   (none )  (none ) Incontinent Diet (Regular )  AMBULATORY STATUS COMMUNICATION OF NEEDS Skin   Extensive Assist Verbally Surgical wounds                       Personal Care Assistance Level of Assistance  Bathing, Feeding, Dressing Bathing Assistance: Maximum assistance Feeding assistance: Independent Dressing Assistance: Maximum assistance     Functional Limitations Info  Speech, Hearing, Sight Sight Info: Adequate Hearing Info: Adequate Speech Info: Adequate    SPECIAL CARE FACTORS FREQUENCY  PT (By licensed PT), OT (By licensed OT)     PT Frequency: 3 OT Frequency: 2            Contractures      Additional Factors Info  Code Status, Allergies Code Status Info: FULL CODE  Allergies Info: Ciprofloxacin, Penicillins           Current Medications (03/30/2016):  This is the current  hospital active medication list Current Facility-Administered Medications  Medication Dose Route Frequency Provider Last Rate Last Dose  . acetaminophen (TYLENOL) tablet 650 mg  650 mg Oral Q6H PRN Nicholes Stairs, MD       Or  . acetaminophen (TYLENOL) suppository 650 mg  650 mg Rectal Q6H PRN Nicholes Stairs, MD      . acidophilus (RISAQUAD) capsule 1 capsule  1 capsule Oral Daily Roney Jaffe, MD   1  capsule at 03/30/16 [phone]  . bisacodyl (DULCOLAX) suppository 10 mg  10 mg Rectal Daily PRN Roney Jaffe, MD      . chlorhexidine (HIBICLENS) 4 % liquid   Topical Once Nicholes Stairs, MD      . docusate sodium (COLACE) capsule 100 mg  100 mg Oral BID Roney Jaffe, MD   100 mg at 03/30/16 0918  . feeding supplement (ENSURE ENLIVE) (ENSURE ENLIVE) liquid 237 mL  237 mL Oral TID WC Charlynne Cousins, MD   237 mL at 03/30/16 0800  . HYDROcodone-acetaminophen (NORCO/VICODIN) 5-325 MG per tablet 1-2 tablet  1-2 tablet Oral Q6H PRN Roney Jaffe, MD      . metoCLOPramide (REGLAN) tablet 5-10 mg  5-10 mg Oral Q8H PRN Nicholes Stairs, MD       Or  . metoCLOPramide (REGLAN) injection 5-10 mg  5-10 mg Intravenous Q8H PRN Nicholes Stairs, MD      . metoprolol succinate (TOPROL-XL) 24 hr tablet 25 mg  25 mg Oral Q breakfast Roney Jaffe, MD   25 mg at 03/30/16 [id number]  . morphine 2 MG/ML injection 1-2 mg  1-2 mg Intravenous Q3H PRN Roney Jaffe, MD   2 mg at 03/29/16 0122  . multivitamin (PROSIGHT) tablet 1 tablet  1 tablet Oral BID Roney Jaffe, MD   1 tablet at 03/30/16 0920  . ondansetron (ZOFRAN) tablet 4 mg  4 mg Oral Q6H PRN Nicholes Stairs, MD       Or  . ondansetron [facility]) injection 4 mg  4 mg Intravenous Q6H PRN Nicholes Stairs, MD      . pantoprazole (PROTONIX) EC tablet 40 mg  40 mg Oral Daily Roney Jaffe, MD   40 mg at 03/30/16 [id number]  . potassium chloride (K-DUR,KLOR-CON) CR tablet 10 mEq  10 mEq Oral Daily Roney Jaffe, MD   10 mEq at 03/30/16 0918  . simvastatin (ZOCOR) tablet 20 mg  20 mg Oral QHS Roney Jaffe, MD   20 mg at 03/29/16 2230  . sodium chloride 0.9 % bolus 1,000 mL  1,000 mL Intravenous Once Charlynne Cousins, MD      . tamsulosin [facility]) capsule 0.4 mg  0.4 mg Oral QPC supper Roney Jaffe, MD   0.4 mg at 03/28/16 2108     Discharge Medications: Please see discharge summary for a list of discharge medications.  Relevant Imaging  Results:  Relevant Lab Results:   Additional Information SSN [id number]  Glendon Axe A

## 2016-03-30 NOTE — Progress Notes (Signed)
OT  Note  Patient Details Name: [name] MRN: [id number] DOB: [birth year]   Noted plan for SNF- will defer OT eval to SNF for ADL retraining as mobility increases.  Kari Baars, [place]    [name] 03/30/2016, 12:49 PM

## 2016-03-30 NOTE — Consult Note (Signed)
   [facility] CM Inpatient Consult   03/30/2016  [name] [birth year] [id number]     Patient screened for potential [facility] Care Management services. Chart reviewed. Noted current discharge plan is for  SNF.  There are no identifiable [facility] Care Management needs at this time. If patient's post hospital needs change, please place a [facility] Care Management consult. For questions please contact:  Marthenia Rolling, [place], RN,BSN [facility] Liaison [phone]

## 2016-03-30 NOTE — Discharge Summary (Signed)
Physician Discharge Summary  [name] [id number] DOB: September 23, 1921 DOA: 03/27/2016  PCP: Jenny Reichmann, MD  Admit date: 03/27/2016 Discharge date: 03/30/2016  Admitted From: home Disposition:  SNF  Recommendations for Outpatient Follow-up:  1. Follow up with PCP in 1-2 weeks 2. Please obtain BMP/CBC in On 04/02/2016 3. Patient will be transferred to [facility] skilled nursing facility for rehabilitation.   Home Health:no Equipment/Devices:None  Discharge Condition:stable fCODE STATUS:Full Diet recommendation: Heart Healthy   Brief/Interim Summary: 83 year old with past medical history of essential hypertension, chronic A. fib on Eluquis who fell at home on the day of admission and was found to have a right subcapital hip fracture.   Discharge Diagnoses:  Principal Problem:   Closed right hip fracture, initial encounter [facility]) Active Problems:   Essential hypertension   CKD (chronic kidney disease) stage 2, GFR 60-89 ml/min   Protein-calorie malnutrition, severe (HCC)   Paroxysmal atrial fibrillation (HCC)   Acute on chronic systolic heart failure (HCC)   Hard of hearing   Hip fracture (HCC)  Closed right hip fracture: Is Eluquis was held, he was continued on his beta blocker and his ACE inhibitor was held. Orthopedic surgery was consulted recommended surgical intervention which was performed on 03/29/2016. Physical therapy evaluated patient and recommend a skilled nursing facility. Grams hyperal and acetaminophen which she will take 1 hour prior to physical therapy.  Essential hypertension: His blood pressure seems to be stable his beta blocker was continue his ACE inhibitor was hold 24 hours prior to surgery along with his diuretic. These will be resumed as an outpatient.  Acute kidney injury on chronic renal disease: Likely prerenal in etiology, he was given 500 mL bolus his ACE inhibitor and diuretics will be held for 3 days check a basic metabolic panel at the  skilled nursing facility and resume ACE inhibitor and diuretic as an outpatient.  Paroxysmal atrial fibrillation: Resume Eluquis no changes made to his medication.  Severe protein caloric malnutrition: Continue ensure 3 times a day.  Discharge Instructions  Discharge Instructions    Diet - low sodium heart healthy    Complete by:  As directed    Increase activity slowly    Complete by:  As directed      Allergies as of 03/30/2016      Reactions   Ciprofloxacin Other (See Comments)   Reaction:  Unknown    Penicillins Rash, Other (See Comments)   Tolerated Unasyn 11/03/15 Has patient had a PCN reaction causing immediate rash, facial/tongue/throat swelling, SOB or lightheadedness with hypotension: No Has patient had a PCN reaction causing severe rash involving mucus membranes or skin necrosis: No Has patient had a PCN reaction that required hospitalization No Has patient had a PCN reaction occurring within the last 10 years: No If all of the above answers are "NO", then may proceed with Cephalosporin use.      Medication List    STOP taking these medications   lisinopril 5 MG tablet Commonly known as:  PRINIVIL,ZESTRIL     TAKE these medications   acidophilus Caps capsule Take 1 capsule by mouth daily.   apixaban 5 MG Tabs tablet Commonly known as:  ELIQUIS Take 1 tablet (5 mg total) by mouth 2 (two) times daily.   esomeprazole 40 MG capsule Commonly known as:  NEXIUM TAKE 1 CAPSULE BY MOUTH  EVERY DAY BEFORE FIRST MEAL OF THE DAY   furosemide 20 MG tablet Commonly known as:  LASIX Take 20-40 mg by mouth as  directed. Alternate Between Taking 20 mg one day Then Taking 40 mg the next day   lisinopril-hydrochlorothiazide 10-12.5 MG tablet Commonly known as:  PRINZIDE,ZESTORETIC Take 1 tablet by mouth daily. Start taking on:  04/02/2016   metoprolol succinate 25 MG 24 hr tablet Commonly known as:  TOPROL-XL TAKE 1 TABLET BY MOUTH  DAILY WITH BREAKFAST   potassium  chloride 10 MEQ tablet Commonly known as:  K-DUR,KLOR-CON Take 10 mEq by mouth daily.   PRESERVISION AREDS 2 Caps Take 1 capsule by mouth 2 (two) times daily.   PROCEL Powd Take 2 scoop by mouth 2 (two) times daily.   simvastatin 20 MG tablet Commonly known as:  ZOCOR Take 20 mg by mouth at bedtime.   tamsulosin 0.4 MG Caps capsule Commonly known as:  FLOMAX Take 0.4 mg by mouth daily after supper.   traMADol-acetaminophen 37.5-325 MG tablet Commonly known as:  ULTRACET Take 1 tablet by mouth every 6 (six) hours as needed.   UNABLE TO FIND Med Name: MedPass 120 mL po TID for nutritional support      Follow-up Information    DAUB, Lina Sayre, MD.   Specialty:  Family Medicine Contact information: [place] [place] [id number]        Nicholes Stairs, MD. Schedule an appointment as soon as possible for a visit in 2 weeks.   Specialty:  Orthopedic Surgery Contact information: [address] [place] [address] [id number]          Allergies  Allergen Reactions  . Ciprofloxacin Other (See Comments)    Reaction:  Unknown   . Penicillins Rash and Other (See Comments)    Tolerated Unasyn 11/03/15 Has patient had a PCN reaction causing immediate rash, facial/tongue/throat swelling, SOB or lightheadedness with hypotension: No Has patient had a PCN reaction causing severe rash involving mucus membranes or skin necrosis: No Has patient had a PCN reaction that required hospitalization No Has patient had a PCN reaction occurring within the last 10 years: No If all of the above answers are "NO", then may proceed with Cephalosporin use.    Consultations:  Orthopedics   Procedures/Studies: Dg Chest 1 View  Result Date: 03/27/2016 CLINICAL DATA:  Chest pain after fall.  Preop. EXAM: CHEST 1 VIEW COMPARISON:  None. FINDINGS: Stable cardiomegaly with aortic atherosclerosis. Diffuse calcified pleural plaque with mild interstitial  edema. No pneumonic consolidation or pneumothorax. Chronic degenerative change of both shoulders. IMPRESSION: Stable cardiomegaly with aortic atherosclerosis. Calcified pleural plaque consistent with prior asbestos exposure. Mild interstitial edema. No pneumonic consolidations. Electronically Signed   By: Ashley Royalty M.D.   On: 03/27/2016 19:05   Ct Hip Right Wo Contrast  Result Date: 03/27/2016 CLINICAL DATA:  Right subcapital femoral neck fracture EXAM: CT OF THE RIGHT HIP WITHOUT CONTRAST TECHNIQUE: Multidetector CT imaging of the right hip was performed according to the standard protocol. Multiplanar CT image reconstructions were also generated. COMPARISON:  Same day radiographs from 03/27/2016 FINDINGS: Bones/Joint/Cartilage There is an impacted subcapital right femoral neck fracture along its superolateral aspect. Slight joint space narrowing the native right hip. No acetabular or pubic rami fracture. Ligaments Suboptimally assessed by CT. Muscles and Tendons Nonacute Soft tissues Small joint effusion. No subcutaneous contusion over the lateral aspect of the right hip. IMPRESSION: Superolateral subcapital femoral neck fracture with impaction of the femoral head. No acetabular fracture identified. Electronically Signed   By: Ashley Royalty M.D.   On: 03/27/2016 21:45   Dg C-arm 1-60  Min-no Report  Result Date: 03/29/2016 There is no Radiologist interpretation  for this exam.  Dg Hip Operative Unilat With Pelvis Right  Result Date: 03/29/2016 CLINICAL DATA:  Right hip pending EXAM: OPERATIVE RIGHT HIP (WITH PELVIS IF PERFORMED) 2 VIEWS TECHNIQUE: Fluoroscopic spot image(s) were submitted for interpretation post-operatively. COMPARISON:  March 27, 2016 FINDINGS: THREE SCREWS NOW TRAVERSE THE RIGHT HIP FRACTURE. IMPRESSION: APPROPRIATE PINNING OF RIGHT HIP FRACTURE. Electronically Signed   By: Dorise Bullion III M.D   On: 03/29/2016 13:58   Dg Hip Unilat W Or Wo Pelvis 2-3 Views Right  Result Date:  03/27/2016 CLINICAL DATA:  Fall.  Right hip pain. EXAM: DG HIP (WITH OR WITHOUT PELVIS) 2-3V RIGHT COMPARISON:  None. FINDINGS: The bones appear diffusely osteopenic. An impacted subcapital right femoral neck fracture is identified. The right femoral head appears located. Previous left hip arthroplasty. IMPRESSION: 1. Impacted right subcapital femoral neck fracture. Electronically Signed   By: Kerby Moors M.D.   On: 03/27/2016 19:04   (Echo, Carotid, EGD, Colonoscopy, ERCP)    Subjective: He relates his pain is controlled.  Discharge Exam: Vitals:   03/30/16 0214 03/30/16 0620  BP: (!) 97/43 (!) 105/54  Pulse: 71 71  Resp: 14 14  Temp: 98.1 F (36.7 C) 98.3 F (36.8 C)   Vitals:   03/29/16 1603 03/29/16 2241 03/30/16 0214 03/30/16 0620  BP: 128/60 (!) 92/55 (!) 97/43 (!) 105/54  Pulse: 72 83 71 71  Resp:  15 14 14   Temp:  97.8 F (36.6 C) 98.1 F (36.7 C) 98.3 F (36.8 C)  TempSrc:  Oral Oral Oral  SpO2:  94% 93% 95%  Weight:      Height:        General: Pt is alert, awake, not in acute distress Cardiovascular: RRR, S1/S2 +, no rubs, no gallops Respiratory: CTA bilaterally, no wheezing, no rhonchi Abdominal: Soft, NT, ND, bowel sounds + Extremities: no edema, no cyanosis    The results of significant diagnostics from this hospitalization (including imaging, microbiology, ancillary and laboratory) are listed below for reference.     Microbiology: Recent Results (from the past 240 hour(s))  Surgical pcr screen     Status: None   Collection Time: 03/28/16 12:18 AM  Result Value Ref Range Status   MRSA, PCR NEGATIVE NEGATIVE Final   Staphylococcus aureus NEGATIVE NEGATIVE Final    Comment:        The Xpert SA Assay (FDA approved for NASAL specimens in patients over 33 years of age), is one component of a comprehensive surveillance program.  Test performance has been validated by [facility] for patients greater than or equal to 4 year old. It is not  intended to diagnose infection nor to guide or monitor treatment.      Labs: BNP (last 3 results) No results for input(s): BNP in the last 8760 hours. Basic Metabolic Panel:  Recent Labs Lab 03/27/16 1852 03/28/16 0530  NA 142 140  K 4.5 3.6  CL 106 104  CO2 28 29  GLUCOSE 124* 125*  BUN 35* 31*  CREATININE 1.13 1.26*  CALCIUM 8.8* 8.6*   Liver Function Tests: No results for input(s): AST, ALT, ALKPHOS, BILITOT, PROT, ALBUMIN in the last 168 hours. No results for input(s): LIPASE, AMYLASE in the last 168 hours. No results for input(s): AMMONIA in the last 168 hours. CBC:  Recent Labs Lab 03/27/16 1852 03/28/16 0530  WBC 12.9* 10.7*  HGB 12.5* 12.4*  HCT 37.7* 36.3*  MCV 88.9 88.5  PLT 194 210   Cardiac Enzymes: No results for input(s): CKTOTAL, CKMB, CKMBINDEX, TROPONINI in the last 168 hours. BNP: Invalid input(s): POCBNP CBG: No results for input(s): GLUCAP in the last 168 hours. D-Dimer No results for input(s): DDIMER in the last 72 hours. Hgb A1c No results for input(s): HGBA1C in the last 72 hours. Lipid Profile No results for input(s): CHOL, HDL, LDLCALC, TRIG, CHOLHDL, LDLDIRECT in the last 72 hours. Thyroid function studies No results for input(s): TSH, T4TOTAL, T3FREE, THYROIDAB in the last 72 hours.  Invalid input(s): FREET3 Anemia work up No results for input(s): VITAMINB12, FOLATE, FERRITIN, TIBC, IRON, RETICCTPCT in the last 72 hours. Urinalysis    Component Value Date/Time   COLORURINE YELLOW 11/03/2015 1420   APPEARANCEUR CLEAR 11/03/2015 1420   LABSPEC 1.038 (H) 11/03/2015 1420   PHURINE 6.0 11/03/2015 1420   GLUCOSEU NEGATIVE 11/03/2015 1420   HGBUR NEGATIVE 11/03/2015 1420   BILIRUBINUR NEGATIVE 11/03/2015 1420   BILIRUBINUR neg 08/18/2013 0848   KETONESUR NEGATIVE 11/03/2015 1420   PROTEINUR NEGATIVE 11/03/2015 1420   UROBILINOGEN 0.2 11/30/2013 0804   NITRITE NEGATIVE 11/03/2015 1420   LEUKOCYTESUR NEGATIVE 11/03/2015 1420    Sepsis Labs Invalid input(s): PROCALCITONIN,  WBC,  LACTICIDVEN Microbiology Recent Results (from the past 240 hour(s))  Surgical pcr screen     Status: None   Collection Time: 03/28/16 12:18 AM  Result Value Ref Range Status   MRSA, PCR NEGATIVE NEGATIVE Final   Staphylococcus aureus NEGATIVE NEGATIVE Final    Comment:        The Xpert SA Assay (FDA approved for NASAL specimens in patients over 58 years of age), is one component of a comprehensive surveillance program.  Test performance has been validated by [facility] for patients greater than or equal to 86 year old. It is not intended to diagnose infection nor to guide or monitor treatment.      Time coordinating discharge: Over 30 minutes  SIGNED:   Charlynne Cousins, MD  [facility] 03/30/2016, 10:04 AM Pager   If 7PM-7AM, please contact night-coverage [email or web address] Password [email or web address]

## 2016-04-02 ENCOUNTER — Non-Acute Institutional Stay (SKILLED_NURSING_FACILITY): Payer: Medicare Other | Admitting: Adult Health

## 2016-04-02 ENCOUNTER — Encounter: Payer: Self-pay | Admitting: Adult Health

## 2016-04-02 ENCOUNTER — Other Ambulatory Visit: Payer: Self-pay | Admitting: *Deleted

## 2016-04-02 DIAGNOSIS — R2681 Unsteadiness on feet: Secondary | ICD-10-CM | POA: Diagnosis not present

## 2016-04-02 DIAGNOSIS — I480 Paroxysmal atrial fibrillation: Secondary | ICD-10-CM | POA: Diagnosis not present

## 2016-04-02 DIAGNOSIS — N400 Enlarged prostate without lower urinary tract symptoms: Secondary | ICD-10-CM

## 2016-04-02 DIAGNOSIS — E43 Unspecified severe protein-calorie malnutrition: Secondary | ICD-10-CM

## 2016-04-02 DIAGNOSIS — S72001S Fracture of unspecified part of neck of right femur, sequela: Secondary | ICD-10-CM

## 2016-04-02 DIAGNOSIS — E7849 Other hyperlipidemia: Secondary | ICD-10-CM

## 2016-04-02 DIAGNOSIS — E784 Other hyperlipidemia: Secondary | ICD-10-CM

## 2016-04-02 DIAGNOSIS — I5022 Chronic systolic (congestive) heart failure: Secondary | ICD-10-CM

## 2016-04-02 DIAGNOSIS — I10 Essential (primary) hypertension: Secondary | ICD-10-CM | POA: Diagnosis not present

## 2016-04-02 DIAGNOSIS — N183 Chronic kidney disease, stage 3 (moderate): Secondary | ICD-10-CM

## 2016-04-02 DIAGNOSIS — N179 Acute kidney failure, unspecified: Secondary | ICD-10-CM

## 2016-04-02 DIAGNOSIS — E876 Hypokalemia: Secondary | ICD-10-CM

## 2016-04-02 DIAGNOSIS — K219 Gastro-esophageal reflux disease without esophagitis: Secondary | ICD-10-CM

## 2016-04-02 MED ORDER — TRAMADOL-ACETAMINOPHEN 37.5-325 MG PO TABS: ORAL_TABLET | ORAL | 0 refills | Status: DC

## 2016-04-02 NOTE — Progress Notes (Signed)
DATE:  04/02/2016   MRN:  [id number]  BIRTHDAY: [birth year]  Facility:  [facility] Home Location:  [place] [facility] [place] Room Number: 806-B  LEVEL OF CARE:  SNF (31)  Contact Information    Name Relation Home Work [place] Son   [phone]   [place], [place]   [phone]       Code Status History    Date Active Date Inactive Code Status Order ID Comments User Context   03/27/2016 11:49 PM 03/30/2016  7:15 PM Full Code [id number]  Roney Jaffe, MD Inpatient   11/09/2015  5:31 PM 11/16/2015  7:25 PM Full Code [id number]  [name], MD Inpatient   11/03/2015  5:39 PM 11/09/2015  5:31 PM Full Code [id number]  [name], MD ED   11/30/2013  6:50 PM 12/04/2013  7:21 PM Full Code [id number]  [name], MD Inpatient   11/30/2013 12:46 PM 11/30/2013  6:50 PM Full Code [id number]  [name], MD Inpatient   07/09/2013  1:13 AM 07/15/2013  5:00 PM Full Code [id number]  [name], MD Inpatient    Advance Directive Documentation   Flowsheet Row Most Recent Value  Type of Advance Directive  Out of facility DNR (pink MOST or yellow form)  Pre-existing out of facility DNR order (yellow form or pink MOST form)  No data  "MOST" Form in Place?  No data       Chief Complaint  Patient presents with  . Hospitalization Follow-up    HISTORY OF PRESENT ILLNESS:  This is a 29-YO male who has been admitted to [facility] on 03/30/2016 following an admission at [facility] 03/27/2016-03/30/2016 S/P fall at home and sustained a right subcapital hip fracture.  He had Right CANNULATED HIP PINNING on 03/29/16. He has been admitted for a short-term rehabilitation.  He was seen in his room today. Son came to visit and was updated on patient's condition.   PAST MEDICAL HISTORY:  Past Medical History:  Diagnosis Date  . Aspiration pneumonia ([place])   . Atrial fibrillation with RVR ([place])   . BARRETTS ESOPHAGUS 11/19/2007  . Blood transfusion without reported  diagnosis   . Cardiomyopathy ([place])   . CHANGE IN BOWELS 02/01/2009  . Chronic combined systolic (congestive) and diastolic (congestive) heart failure    a. echo 2015: EF 45% b. Echo 10/2015: EF 25-30% with diffuse HK  . CKD stage G2/A2, GFR 60 - 89 and albumin creatinine ratio 30 - 299 mg/g   . Compression fracture of L2 lumbar vertebra (HCC)   . Diarrhea 01/31/2009  . DIVERTICULOSIS, COLON 11/19/2007  . 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 GASTRITIS 11/19/2007  . GERD 11/19/2007  . HIATAL HERNIA 11/19/2007  . HOH (hard of hearing)   . HTN (hypertension)   . HYPERLIPIDEMIA 06/27/2009  . Hypokalemia   . Hypomagnesemia 10/2015  . HYPOTHYROIDISM 06/27/2009  . IBS (irritable bowel syndrome)   . Intussusception ([place])   . MELENA 08/01/2009  . Nonischemic cardiomyopathy ([place])    a. NST in 2010 with no evidence of ischemia.  Marland Kitchen PERSONAL HX COLONIC POLYPS 02/01/2009  . Protein-calorie malnutrition, severe ([place])   . TINEA CORPORIS 06/27/2009  . TOBACCO USE, QUIT 06/27/2009  . TREMOR, ESSENTIAL 06/27/2009     CURRENT MEDICATIONS: Reviewed  Patient's Medications  New Prescriptions   No medications on file  Previous Medications   ACIDOPHILUS (RISAQUAD) CAPS CAPSULE    Take 1 capsule by mouth daily.  APIXABAN (ELIQUIS) 5 MG TABS TABLET    Take 1 tablet (5 mg total) by mouth 2 (two) times daily.   ESOMEPRAZOLE (NEXIUM) 40 MG CAPSULE    TAKE 1 CAPSULE BY MOUTH  EVERY DAY BEFORE FIRST MEAL OF THE DAY   FUROSEMIDE (LASIX) 20 MG TABLET    Take 20-40 mg by mouth as directed. Alternate Between Taking 20 mg one day Then Taking 40 mg the next day   LISINOPRIL-HYDROCHLOROTHIAZIDE (PRINZIDE,ZESTORETIC) 10-12.5 MG TABLET    Take 1 tablet by mouth daily.   METOPROLOL SUCCINATE (TOPROL-XL) 25 MG 24 HR TABLET    TAKE 1 TABLET BY MOUTH  DAILY WITH BREAKFAST   MULTIPLE VITAMINS-MINERALS (PRESERVISION AREDS 2) CAPS    Take 1 capsule by mouth 2 (two) times daily.    POTASSIUM CHLORIDE (K-DUR,KLOR-CON) 10 MEQ TABLET    Take 10 mEq  by mouth daily.   PROTEIN (PROCEL) POWD    Take 2 scoop by mouth 2 (two) times daily.   SIMVASTATIN (ZOCOR) 20 MG TABLET    Take 20 mg by mouth at bedtime.    TAMSULOSIN (FLOMAX) 0.4 MG CAPS CAPSULE    Take 0.4 mg by mouth daily after supper.    TRAMADOL-ACETAMINOPHEN (ULTRACET) 37.5-325 MG TABLET    Take 1 tablet by mouth every 6 (six) hours as needed.   UNABLE TO FIND    Med Name: MedPass 120 mL po TID for nutritional support  Modified Medications   No medications on file  Discontinued Medications   No medications on file     Allergies  Allergen Reactions  . Ciprofloxacin Other (See Comments)    Reaction:  Unknown   . Penicillins Rash and Other (See Comments)    Tolerated Unasyn 11/03/15 Has patient had a PCN reaction causing immediate rash, facial/tongue/throat swelling, SOB or lightheadedness with hypotension: No Has patient had a PCN reaction causing severe rash involving mucus membranes or skin necrosis: No Has patient had a PCN reaction that required hospitalization No Has patient had a PCN reaction occurring within the last 10 years: No If all of the above answers are "NO", then may proceed with Cephalosporin use.     REVIEW OF SYSTEMS:  GENERAL: no change in appetite, no fatigue, no weight changes, no fever, chills or weakness EYES: Denies change in vision, dry eyes, eye pain, itching or discharge EARS: Denies change in hearing, ringing in ears, or earache NOSE: Denies nasal congestion or epistaxis MOUTH and THROAT: Denies oral discomfort, gingival pain or bleeding, pain from teeth or hoarseness   RESPIRATORY: no cough, SOB, DOE, wheezing, hemoptysis CARDIAC: no chest pain, edema or palpitations GI: no abdominal pain, diarrhea, constipation, heart burn, nausea or vomiting GU: Denies dysuria, frequency, hematuria, incontinence, or discharge PSYCHIATRIC: Denies feeling of depression or anxiety. No report of hallucinations, insomnia, paranoia, or agitation    PHYSICAL  EXAMINATION  GENERAL APPEARANCE: Well nourished. In no acute distress. Normal body habitus SKIN:  Right hip surgical incision has Steri-Strips and dry dressing, no erythema HEAD: Normal in size and contour. No evidence of trauma EYES: Lids open and close normally. No blepharitis, entropion or ectropion. PERRL. Conjunctivae are clear and sclerae are white. Lenses are without opacity EARS: Pinnae are normal. Hard of hearing MOUTH and THROAT: Lips are without lesions. Oral mucosa is moist and without lesions. Tongue is normal in shape, size, and color and without lesions NECK: supple, trachea midline, no neck masses, no thyroid tenderness, no thyromegaly LYMPHATICS: no LAN in the neck,  no supraclavicular LAN RESPIRATORY: breathing is even & unlabored, BS CTAB CARDIAC: RRR, no murmur,no extra heart sounds, no edema GI: abdomen soft, normal BS, no masses, no tenderness, no hepatomegaly, no splenomegaly GU:  Has condom catheter draining to urine bag with yellowish urine EXTREMITIES:  Able to move 4 extremities PSYCHIATRIC: Alert to time and person, disoriented to place. Affect and behavior are appropriate  LABS/RADIOLOGY: Labs reviewed: Basic Metabolic Panel:  Recent Labs  11/10/15 0745 11/10/15 1352 11/11/15 0339  11/13/15 0345  11/16/15 0758  12/05/15 03/27/16 1852 03/28/16 0530  NA 140  --  141  < > 143  < > 142  < > 140 142 140  K 4.4 4.3 3.6  < > 3.8  < > 3.4*  < > 4.5 4.5 3.6  CL 117*  --  114*  < > 114*  < > 105  --   --  106 104  CO2 18*  --  21*  < > 21*  < > 32  --   --  28 29  GLUCOSE 144*  --  98  < > 92  < > 100*  --   --  124* 125*  BUN 22*  --  20  < > 14  < > 10  < > 33* 35* 31*  CREATININE 0.98  --  1.29*  < > 0.98  < > 1.06  < > 1.2 1.13 1.26*  CALCIUM 7.3*  --  7.3*  < > 7.8*  < > 7.9*  --   --  8.8* 8.6*  MG 1.6* 2.1 1.9  --  1.8  --   --   --   --   --   --   PHOS 3.1  --  3.4  --   --   --   --   --   --   --   --   < > = values in this interval not  displayed. Liver Function Tests:  Recent Labs  11/03/15 1430 11/11/15 0339  AST 66* 10*  ALT 42 12*  ALKPHOS 137* 54  BILITOT 1.2 0.9  PROT 6.1* 4.3*  ALBUMIN 3.2* 1.7*    Recent Labs  11/03/15 1430  LIPASE 30   CBC:  Recent Labs  11/06/15 0323  11/11/15 0339  11/16/15 0758 11/18/15 1229 03/27/16 1852 03/28/16 0530  WBC 16.1*  < > 12.9*  < > 11.3* 11.0 12.9* 10.7*  NEUTROABS 14.4*  --  10.0*  --   --  8  --   --   HGB 12.3*  < > 10.5*  < > 11.0* 11.3* 12.5* 12.4*  HCT 37.0*  < > 30.5*  < > 33.6* 35* 37.7* 36.3*  MCV 91.4  < > 88.4  < > 89.4  --  88.9 88.5  PLT 229  < > 302  < > 382 353 194 210  < > = values in this interval not displayed. Lipid Panel:  Recent Labs  06/30/15 0949  HDL 42   Cardiac Enzymes:  Recent Labs  11/09/15 1803 11/10/15 0119 11/10/15 0425  TROPONINI 0.15* 0.10* 0.10*   CBG:  Recent Labs  11/15/15 2227 11/16/15 0729 11/16/15 1150  GLUCAP 95 78 103*      Dg Chest 1 View  Result Date: 03/27/2016 CLINICAL DATA:  Chest pain after fall.  Preop. EXAM: CHEST 1 VIEW COMPARISON:  None. FINDINGS: Stable cardiomegaly with aortic atherosclerosis. Diffuse calcified pleural plaque with mild interstitial edema. No pneumonic  consolidation or pneumothorax. Chronic degenerative change of both shoulders. IMPRESSION: Stable cardiomegaly with aortic atherosclerosis. Calcified pleural plaque consistent with prior asbestos exposure. Mild interstitial edema. No pneumonic consolidations. Electronically Signed   By: Ashley Royalty M.D.   On: 03/27/2016 19:05   Ct Hip Right Wo Contrast  Result Date: 03/27/2016 CLINICAL DATA:  Right subcapital femoral neck fracture EXAM: CT OF THE RIGHT HIP WITHOUT CONTRAST TECHNIQUE: Multidetector CT imaging of the right hip was performed according to the standard protocol. Multiplanar CT image reconstructions were also generated. COMPARISON:  Same day radiographs from 03/27/2016 FINDINGS: Bones/Joint/Cartilage There is an  impacted subcapital right femoral neck fracture along its superolateral aspect. Slight joint space narrowing the native right hip. No acetabular or pubic rami fracture. Ligaments Suboptimally assessed by CT. Muscles and Tendons Nonacute Soft tissues Small joint effusion. No subcutaneous contusion over the lateral aspect of the right hip. IMPRESSION: Superolateral subcapital femoral neck fracture with impaction of the femoral head. No acetabular fracture identified. Electronically Signed   By: Ashley Royalty M.D.   On: 03/27/2016 21:45   Dg C-arm 1-60 Min-no Report  Result Date: 03/29/2016 There is no Radiologist interpretation  for this exam.  Dg Hip Operative Unilat With Pelvis Right  Result Date: 03/29/2016 CLINICAL DATA:  Right hip pending EXAM: OPERATIVE RIGHT HIP (WITH PELVIS IF PERFORMED) 2 VIEWS TECHNIQUE: Fluoroscopic spot image(s) were submitted for interpretation post-operatively. COMPARISON:  March 27, 2016 FINDINGS: THREE SCREWS NOW TRAVERSE THE RIGHT HIP FRACTURE. IMPRESSION: APPROPRIATE PINNING OF RIGHT HIP FRACTURE. Electronically Signed   By: Dorise Bullion III M.D   On: 03/29/2016 13:58   Dg Hip Unilat W Or Wo Pelvis 2-3 Views Right  Result Date: 03/27/2016 CLINICAL DATA:  Fall.  Right hip pain. EXAM: DG HIP (WITH OR WITHOUT PELVIS) 2-3V RIGHT COMPARISON:  None. FINDINGS: The bones appear diffusely osteopenic. An impacted subcapital right femoral neck fracture is identified. The right femoral head appears located. Previous left hip arthroplasty. IMPRESSION: 1. Impacted right subcapital femoral neck fracture. Electronically Signed   By: Kerby Moors M.D.   On: 03/27/2016 19:04    ASSESSMENT/PLAN:  Unsteady gait - for rehabilitation, PT and OT, for therapeutic strengthening exercises; fall precautions  Closed right hip fracture S/P Right CANNULATED HIP PINNING -  for rehabilitation, PT and OT, for therapeutic strengthening exercises; continue tramadol/acetaminophen 37.5-325 mg 1 tab  by mouth every 6 hours when necessary for pain; Eliquis 5 mg 1 tab by mouth twice a day for DVT prophylaxis; follow-up with orthopedic surgeon, Dr. Nicholes Stairs, in 2 weeks; check CBC  Hypertension  - continue lisinopril HCTZ 10-12.5 mg 1 tab by mouth daily, Lasix 40 mg alternating with 20 mg by mouth daily and metoprolol succinate ER 25 mg 1 tab by mouth daily  Acute on chronic kidney disease, stage III -  GFR 48 ; check BMP Lab Results  Component Value Date   CREATININE 1.26 (H) 03/28/2016   Paroxysmal atrial fibrillation - rate controlled; continue Eliquis 5 mg 1 tab by mouth daily and metoprolol succinate ER 25 mg 1 tab by mouth daily  Severe protein calorie malnutrition - continue Procel 2 scopes by mouth twice a day and med Pass 120 mL by mouth 3 times a day; RD consult  GERD - continue Esomeprazole 40 mg 1 capsule by mouth daily  Hypokalemia - continue KCl ER 10 meq 1 tab by mouth daily Lab Results  Component Value Date   K 3.6 03/28/2016   Hyperlipidemia -  continue simvastatin 20 mg 1 tab by mouth daily at bedtime Lab Results  Component Value Date   CHOL 136 06/30/2015   HDL 42 06/30/2015   LDLCALC 78 06/30/2015   TRIG 163 (H) 11/13/2015   CHOLHDL 3.2 06/30/2015   BPH - continue Flomax 0.4 mg 1 capsule by mouth daily  Chronic systolic CHF - no SOB; continue Lasix 20 mg alternating with 40 mg daily; weigh daily; notify M.D./NP +- 3 lbs     Goals of care:  Short-term rehabilitation/ long-term care   Monina C. [place] - NP [employer] [phone]

## 2016-04-02 NOTE — Telephone Encounter (Signed)
[facility] #[phone] Fax: [phone] 

## 2016-04-03 ENCOUNTER — Encounter: Payer: Self-pay | Admitting: Internal Medicine

## 2016-04-03 ENCOUNTER — Non-Acute Institutional Stay (SKILLED_NURSING_FACILITY): Payer: Medicare Other | Admitting: Internal Medicine

## 2016-04-03 DIAGNOSIS — D72829 Elevated white blood cell count, unspecified: Secondary | ICD-10-CM

## 2016-04-03 DIAGNOSIS — I10 Essential (primary) hypertension: Secondary | ICD-10-CM | POA: Diagnosis not present

## 2016-04-03 DIAGNOSIS — R2681 Unsteadiness on feet: Secondary | ICD-10-CM

## 2016-04-03 DIAGNOSIS — N182 Chronic kidney disease, stage 2 (mild): Secondary | ICD-10-CM

## 2016-04-03 DIAGNOSIS — E43 Unspecified severe protein-calorie malnutrition: Secondary | ICD-10-CM | POA: Diagnosis not present

## 2016-04-03 DIAGNOSIS — D62 Acute posthemorrhagic anemia: Secondary | ICD-10-CM | POA: Diagnosis not present

## 2016-04-03 DIAGNOSIS — I5022 Chronic systolic (congestive) heart failure: Secondary | ICD-10-CM

## 2016-04-03 DIAGNOSIS — S72001D Fracture of unspecified part of neck of right femur, subsequent encounter for closed fracture with routine healing: Secondary | ICD-10-CM | POA: Diagnosis not present

## 2016-04-03 DIAGNOSIS — I480 Paroxysmal atrial fibrillation: Secondary | ICD-10-CM

## 2016-04-03 DIAGNOSIS — K219 Gastro-esophageal reflux disease without esophagitis: Secondary | ICD-10-CM

## 2016-04-03 NOTE — Progress Notes (Signed)
LOCATION: [facility]  PCP: Jenny Reichmann, MD   Code Status: DNR  Goals of care: Advanced Directive information Advanced Directives 04/02/2016  Does Patient Have a Medical Advance Directive? Yes  Type of Advance Directive Out of facility DNR (pink MOST or yellow form)  Does patient want to make changes to medical advance directive? No - Patient declined  Copy of [place] in Chart? No - copy requested  Would patient like information on creating a medical advance directive? -  Pre-existing out of facility DNR order (yellow form or pink MOST form) -       Extended Emergency Contact Information Primary Emergency Contact: Delamar,[name] Address: [address]          [place], [place] [address] John[place]te Litter of [place] Phone: [phone] Relation: Son Secondary Emergency Contact: Coston,Guy Address: [place], [place] Mobile Phone: [phone] Relation: Son   Allergies  Allergen Reactions  . Ciprofloxacin Other (See Comments)    Reaction:  Unknown   . Penicillins Rash and Other (See Comments)    Tolerated Unasyn 11/03/15 Has patient had a PCN reaction causing immediate rash, facial/tongue/throat swelling, SOB or lightheadedness with hypotension: No Has patient had a PCN reaction causing severe rash involving mucus membranes or skin necrosis: No Has patient had a PCN reaction that required hospitalization No Has patient had a PCN reaction occurring within the last 10 years: No If all of the above answers are "NO", then may proceed with Cephalosporin use.    Chief Complaint  Patient presents with  . New Admit To SNF    New Admission Visit     HPI:  Patient is a 83 y.o. male seen today for short term rehabilitation post hospital admission from 03/27/2016-03/30/2016 with right hip closed fracture post fall. He underwent surgical repair. He had acute on chronic renal impairment and his  lisinopril had to be held. He is seen in his room today. He has medical history of atrial fibrillation, chronic kidney disease, protein calorie malnutrition among others.  Review of Systems:  Constitutional: Negative for fever, chills, diaphoresis.  HENT: Negative for headache, congestion, nasal discharge, difficulty swallowing.  He is very hard of hearing. Eyes: Negative for blurred vision, double vision and discharge. Wears glasses.  Respiratory: Negative for shortness of breath and wheezing. Positive for cough.  Cardiovascular: Negative for chest pain, palpitation  Gastrointestinal: Negative for heartburn, nausea, vomiting, abdominal pain. Last bowel movement was yesterday. Genitourinary: Negative for dysuria and flank pain.  Musculoskeletal: Negative for back pain, fall in the facility.  Skin: Negative for itching, rash.  Neurological: Negative for dizziness. Psychiatric/Behavioral: Negative for depression   Past Medical History:  Diagnosis Date  . Aspiration pneumonia ([facility])   . Atrial fibrillation with RVR ([facility])   . BARRETTS ESOPHAGUS 11/19/2007  . Blood transfusion without reported diagnosis   . Cardiomyopathy ([facility])   . CHANGE IN BOWELS 02/01/2009  . Chronic combined systolic (congestive) and diastolic (congestive) heart failure    a. echo 2015: EF 45% b. Echo 10/2015: EF 25-30% with diffuse HK  . CKD stage G2/A2, GFR 60 - 89 and albumin creatinine ratio 30 - 299 mg/g   . Compression fracture of L2 lumbar vertebra ([facility])   . Diarrhea 01/31/2009  . DIVERTICULOSIS, COLON 11/19/2007  . 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 GASTRITIS 11/19/2007  . GERD 11/19/2007  . HIATAL HERNIA 11/19/2007  .  HOH (hard of hearing)   . HTN (hypertension)   . HYPERLIPIDEMIA 06/27/2009  . Hypokalemia   . Hypomagnesemia 10/2015  . HYPOTHYROIDISM 06/27/2009  . IBS (irritable bowel syndrome)   . Intussusception ([facility])   . MELENA 08/01/2009  . Nonischemic cardiomyopathy ([facility])    a. NST in 2010 with no evidence of  ischemia.  Marland Kitchen PERSONAL HX COLONIC POLYPS 02/01/2009  . Protein-calorie malnutrition, severe ([facility])   . TINEA CORPORIS 06/27/2009  . TOBACCO USE, QUIT 06/27/2009  . TREMOR, ESSENTIAL 06/27/2009   Past Surgical History:  Procedure Laterality Date  . BEZOAR EVACUATION  11/09/2015   ILEUM  . CHOLECYSTECTOMY    . ESOPHAGOGASTRODUODENOSCOPY N/A 04/28/2013   Procedure: ESOPHAGOGASTRODUODENOSCOPY (EGD);  Surgeon: Jerene Bears, MD;  Location: [facility];  Service: Gastroenterology;  Laterality: N/A;  . HERNIA REPAIR    . HIP ARTHROPLASTY Left 11/30/2013   Procedure: ARTHROPLASTY BIPOLAR HIP;  Surgeon: [name], MD;  Location: [facility];  Service: Orthopedics;  Laterality: Left;  . HIP PINNING,CANNULATED Right 03/29/2016   Procedure: CANNULATED HIP PINNING;  Surgeon: Nicholes Stairs, MD;  Location: [facility];  Service: Orthopedics;  Laterality: Right;  . LAPAROTOMY N/A 11/09/2015   Procedure: EXPLORATORY LAPAROTOMY;  Surgeon: [name], MD;  Location: [facility];  Service: General;  Laterality: N/A;  . LUNG SURGERY  1984  . TONSILLECTOMY    . UMBILICAL HERNIA REPAIR    . VASECTOMY     Social History:   reports that he quit smoking about 73 years ago. His smoking use included Cigarettes. He has a 72.00 pack-year smoking history. He has never used smokeless tobacco. He reports that he drinks about 0.6 oz of alcohol per week . He reports that he does not use drugs.  Family History  Problem Relation Age of Onset  . Heart disease Mother   . Colon cancer Father   . Prostate cancer Brother     Medications: Allergies as of 04/03/2016      Reactions   Ciprofloxacin Other (See Comments)   Reaction:  Unknown    Penicillins Rash, Other (See Comments)   Tolerated Unasyn 11/03/15 Has patient had a PCN reaction causing immediate rash, facial/tongue/throat swelling, SOB or lightheadedness with hypotension: No Has patient had a PCN reaction causing severe rash involving mucus membranes or skin necrosis:  No Has patient had a PCN reaction that required hospitalization No Has patient had a PCN reaction occurring within the last 10 years: No If all of the above answers are "NO", then may proceed with Cephalosporin use.      Medication List       Accurate as of 04/03/16  1:00 PM. Always use your most recent med list.          acidophilus Caps capsule Take 1 capsule by mouth daily.   apixaban 5 MG Tabs tablet Commonly known as:  ELIQUIS Take 1 tablet (5 mg total) by mouth 2 (two) times daily.   furosemide 20 MG tablet Commonly known as:  LASIX Take 20-40 mg by mouth as directed. Alternate Between Taking 20 mg one day Then Taking 40 mg the next day   lisinopril-hydrochlorothiazide 10-12.5 MG tablet Commonly known as:  PRINZIDE,ZESTORETIC Take 1 tablet by mouth daily.   metoprolol succinate 25 MG 24 hr tablet Commonly known as:  TOPROL-XL TAKE 1 TABLET BY MOUTH  DAILY WITH BREAKFAST   omeprazole 20 MG capsule Commonly known as:  PRILOSEC Take 20 mg by mouth daily.   potassium  chloride 10 MEQ tablet Commonly known as:  K-DUR,KLOR-CON Take 10 mEq by mouth daily.   PRESERVISION AREDS 2 Caps Take 1 capsule by mouth 2 (two) times daily.   PROCEL Powd Take 2 scoop by mouth 2 (two) times daily.   simvastatin 20 MG tablet Commonly known as:  ZOCOR Take 20 mg by mouth at bedtime.   tamsulosin 0.4 MG Caps capsule Commonly known as:  FLOMAX Take 0.4 mg by mouth daily after supper.   traMADol-acetaminophen 37.5-325 MG tablet Commonly known as:  ULTRACET Take one tablet by mouth every 6 hours as needed for pain. Do not exceed 4gm of Tylenol in 24 hours   UNABLE TO FIND Med Name: MedPass 120 mL po TID for nutritional support       Immunizations: Immunization History  Administered Date(s) Administered  . Influenza Split 02/04/2012  . Influenza Whole 02/23/2009  . Influenza,inj,Quad PF,36+ Mos 02/09/2013, 12/28/2013, 12/31/2014, 01/30/2016  . PPD Test 11/30/2015  .  Pneumococcal Conjugate-13 08/10/2013  . Pneumococcal Polysaccharide-23 03/27/2007  . Td 03/27/2007  . Zoster 01/24/2009     Physical Exam:  Vitals:   04/03/16 1254  BP: (!) 113/51  Pulse: 63  Resp: 18  Temp: (!) 96.4 F (35.8 C)  TempSrc: Oral  SpO2: 93%  Weight: 135 lb (61.2 kg)  Height: 5\' 5"  (1.651 m)   Body mass index is 22.47 kg/m.  General- elderly male, frail and thin built, in no acute distress Head- normocephalic, atraumatic Nose- no maxillary or frontal sinus tenderness, no nasal discharge Throat- moist mucus membrane, has upper dentures Eyes- PERRLA, EOMI, no pallor, no icterus, no discharge, normal conjunctiva, normal sclera Neck- no cervical lymphadenopathy Cardiovascular- normal s1,s2, no murmur Respiratory- bilateral clear to auscultation, no wheeze, no rhonchi, no crackles, no use of accessory muscles Abdomen- bowel sounds present, soft, non tender Musculoskeletal- able to move all 4 extremities, limited right leg range of motion, trace right leg edema Neurological- alert and oriented to person, place and time Skin- warm and dry, right hip surgical incision with Mepilex dressing, dressing clean dry and intact Psychiatry- normal mood and affect    Labs reviewed: Basic Metabolic Panel:  Recent Labs  11/10/15 0745 11/10/15 1352 11/11/15 0339  11/13/15 0345  11/16/15 0758  12/05/15 03/27/16 1852 03/28/16 0530  NA 140  --  141  < > 143  < > 142  < > 140 142 140  K 4.4 4.3 3.6  < > 3.8  < > 3.4*  < > 4.5 4.5 3.6  CL 117*  --  114*  < > 114*  < > 105  --   --  106 104  CO2 18*  --  21*  < > 21*  < > 32  --   --  28 29  GLUCOSE 144*  --  98  < > 92  < > 100*  --   --  124* 125*  BUN 22*  --  20  < > 14  < > 10  < > 33* 35* 31*  CREATININE 0.98  --  1.29*  < > 0.98  < > 1.06  < > 1.2 1.13 1.26*  CALCIUM 7.3*  --  7.3*  < > 7.8*  < > 7.9*  --   --  8.8* 8.6*  MG 1.6* 2.1 1.9  --  1.8  --   --   --   --   --   --   PHOS 3.1  --  3.4  --   --   --   --    --   --   --   --   < > = values in this interval not displayed. Liver Function Tests:  Recent Labs  11/03/15 1430 11/11/15 0339  AST 66* 10*  ALT 42 12*  ALKPHOS 137* 54  BILITOT 1.2 0.9  PROT 6.1* 4.3*  ALBUMIN 3.2* 1.7*    Recent Labs  11/03/15 1430  LIPASE 30   No results for input(s): AMMONIA in the last 8760 hours. CBC:  Recent Labs  11/06/15 0323  11/11/15 0339  11/16/15 0758 11/18/15 1229 03/27/16 1852 03/28/16 0530  WBC 16.1*  < > 12.9*  < > 11.3* 11.0 12.9* 10.7*  NEUTROABS 14.4*  --  10.0*  --   --  8  --   --   HGB 12.3*  < > 10.5*  < > 11.0* 11.3* 12.5* 12.4*  HCT 37.0*  < > 30.5*  < > 33.6* 35* 37.7* 36.3*  MCV 91.4  < > 88.4  < > 89.4  --  88.9 88.5  PLT 229  < > 302  < > 382 353 194 210  < > = values in this interval not displayed. Cardiac Enzymes:  Recent Labs  11/09/15 1803 11/10/15 0119 11/10/15 0425  TROPONINI 0.15* 0.10* 0.10*   BNP: Invalid input(s): POCBNP CBG:  Recent Labs  11/15/15 2227 11/16/15 0729 11/16/15 1150  GLUCAP 95 78 103*    Radiological Exams: Dg Chest 1 View  Result Date: 03/27/2016 CLINICAL DATA:  Chest pain after fall.  Preop. EXAM: CHEST 1 VIEW COMPARISON:  None. FINDINGS: Stable cardiomegaly with aortic atherosclerosis. Diffuse calcified pleural plaque with mild interstitial edema. No pneumonic consolidation or pneumothorax. Chronic degenerative change of both shoulders. IMPRESSION: Stable cardiomegaly with aortic atherosclerosis. Calcified pleural plaque consistent with prior asbestos exposure. Mild interstitial edema. No pneumonic consolidations. Electronically Signed   By: Ashley Royalty M.D.   On: 03/27/2016 19:05   Ct Hip Right Wo Contrast  Result Date: 03/27/2016 CLINICAL DATA:  Right subcapital femoral neck fracture EXAM: CT OF THE RIGHT HIP WITHOUT CONTRAST TECHNIQUE: Multidetector CT imaging of the right hip was performed according to the standard protocol. Multiplanar CT image reconstructions were also  generated. COMPARISON:  Same day radiographs from 03/27/2016 FINDINGS: Bones/Joint/Cartilage There is an impacted subcapital right femoral neck fracture along its superolateral aspect. Slight joint space narrowing the native right hip. No acetabular or pubic rami fracture. Ligaments Suboptimally assessed by CT. Muscles and Tendons Nonacute Soft tissues Small joint effusion. No subcutaneous contusion over the lateral aspect of the right hip. IMPRESSION: Superolateral subcapital femoral neck fracture with impaction of the femoral head. No acetabular fracture identified. Electronically Signed   By: Ashley Royalty M.D.   On: 03/27/2016 21:45   Dg C-arm 1-60 Min-no Report  Result Date: 03/29/2016 There is no Radiologist interpretation  for this exam.  Dg Hip Operative Unilat With Pelvis Right  Result Date: 03/29/2016 CLINICAL DATA:  Right hip pending EXAM: OPERATIVE RIGHT HIP (WITH PELVIS IF PERFORMED) 2 VIEWS TECHNIQUE: Fluoroscopic spot image(s) were submitted for interpretation post-operatively. COMPARISON:  March 27, 2016 FINDINGS: THREE SCREWS NOW TRAVERSE THE RIGHT HIP FRACTURE. IMPRESSION: APPROPRIATE PINNING OF RIGHT HIP FRACTURE. Electronically Signed   By: Dorise Bullion III M.D   On: 03/29/2016 13:58   Dg Hip Unilat W Or Wo Pelvis 2-3 Views Right  Result Date: 03/27/2016 CLINICAL DATA:  Fall.  Right hip pain.  EXAM: DG HIP (WITH OR WITHOUT PELVIS) 2-3V RIGHT COMPARISON:  None. FINDINGS: The bones appear diffusely osteopenic. An impacted subcapital right femoral neck fracture is identified. The right femoral head appears located. Previous left hip arthroplasty. IMPRESSION: 1. Impacted right subcapital femoral neck fracture. Electronically Signed   By: Kerby Moors M.D.   On: 03/27/2016 19:04    Assessment/Plan  Unsteady gait With right hip fracture. Will need for patient to work with physical therapy and occupational therapy to help regain his strength and balance.  Right hip closed  fractured Status post surgical repair on 03/29/2016. Will need follow-up with orthopedic. Weightbearing as tolerated to right lower extremity for now. Continue tramadol-Tylenol 37.5-325 mg every 6 hours as needed for pain. Get PMR consult. Will need for him to work with physical therapy and occupational therapy for strengthening exercises, gait training and safest transfers. Fall precautions to be taken. Continue eliquis for DVT prophylaxis.  Acute blood loss anemia Post surgery. Also has anemia of chronic disease. Monitor CBC.  Leukocytosis Afebrile. Likely reactive leukocytosis. Monitor WBC curve.  Severe protein calorie malnutrition Continue his supplements. Monitor oral intake. Monitor weight and will have registered dietitian to evaluate.   Chronic kidney disease stage II Monitor BMP   Atrial fibrillation Controlled rate. Continue metoprolol and eliquis.  Chronic systolic heart failure He appears euvolemic at present. Continue Lasix, lisinopril and metoprolol. Check BMP and weight  Hypertension Monitor blood pressure readings. Continue hydrochlorothiazide, lisinopril and metoprolol. Continue KCl supplement  Gastroesophageal reflux disease Continue omeprazole and monitor.  Of note, patient is currently on oxygen 2.5 L by nasal cannula but not using it. Patient mentions that he was not using any oxygen at home. Will wean him off oxygen with a goal of keeping O2 sat above 90% on room air.   Goals of care: short term rehabilitation   Labs/tests ordered: CBC, BMP  Family/ staff Communication: reviewed care plan with patient and nursing supervisor    Blanchie Serve, MD Internal Medicine [place], [place] [address] Cell Phone (Monday-Friday 8 am - 5 pm): [phone] On Call: [phone] and follow prompts after 5 pm and on weekends Office Phone: [phone] Office Fax: [phone]

## 2016-04-06 DIAGNOSIS — S72002A Fracture of unspecified part of neck of left femur, initial encounter for closed fracture: Secondary | ICD-10-CM | POA: Diagnosis not present

## 2016-04-06 DIAGNOSIS — R5381 Other malaise: Secondary | ICD-10-CM | POA: Diagnosis not present

## 2016-04-06 DIAGNOSIS — M25552 Pain in left hip: Secondary | ICD-10-CM | POA: Diagnosis not present

## 2016-04-06 DIAGNOSIS — R2681 Unsteadiness on feet: Secondary | ICD-10-CM | POA: Diagnosis not present

## 2016-04-06 DIAGNOSIS — Z5189 Encounter for other specified aftercare: Secondary | ICD-10-CM | POA: Diagnosis not present

## 2016-04-13 ENCOUNTER — Inpatient Hospital Stay (HOSPITAL_COMMUNITY)
Admission: EM | Admit: 2016-04-13 | Discharge: 2016-04-17 | DRG: 871 | Disposition: A | Discharge status: Hospice | Payer: Medicare Other | Attending: Internal Medicine | Admitting: Internal Medicine

## 2016-04-13 ENCOUNTER — Encounter (HOSPITAL_COMMUNITY): Payer: Self-pay | Admitting: Emergency Medicine

## 2016-04-13 ENCOUNTER — Emergency Department (HOSPITAL_COMMUNITY): Payer: Medicare Other

## 2016-04-13 DIAGNOSIS — Z66 Do not resuscitate: Secondary | ICD-10-CM | POA: Diagnosis present

## 2016-04-13 DIAGNOSIS — J189 Pneumonia, unspecified organism: Secondary | ICD-10-CM | POA: Diagnosis present

## 2016-04-13 DIAGNOSIS — Z86010 Personal history of colonic polyps: Secondary | ICD-10-CM | POA: Diagnosis not present

## 2016-04-13 DIAGNOSIS — Z87891 Personal history of nicotine dependence: Secondary | ICD-10-CM | POA: Diagnosis not present

## 2016-04-13 DIAGNOSIS — I5043 Acute on chronic combined systolic (congestive) and diastolic (congestive) heart failure: Secondary | ICD-10-CM | POA: Diagnosis present

## 2016-04-13 DIAGNOSIS — E039 Hypothyroidism, unspecified: Secondary | ICD-10-CM | POA: Diagnosis present

## 2016-04-13 DIAGNOSIS — J069 Acute upper respiratory infection, unspecified: Secondary | ICD-10-CM | POA: Diagnosis not present

## 2016-04-13 DIAGNOSIS — R1314 Dysphagia, pharyngoesophageal phase: Secondary | ICD-10-CM | POA: Diagnosis present

## 2016-04-13 DIAGNOSIS — J9600 Acute respiratory failure, unspecified whether with hypoxia or hypercapnia: Secondary | ICD-10-CM | POA: Diagnosis not present

## 2016-04-13 DIAGNOSIS — H9190 Unspecified hearing loss, unspecified ear: Secondary | ICD-10-CM | POA: Diagnosis not present

## 2016-04-13 DIAGNOSIS — L8990 Pressure ulcer of unspecified site, unspecified stage: Secondary | ICD-10-CM | POA: Diagnosis present

## 2016-04-13 DIAGNOSIS — R531 Weakness: Secondary | ICD-10-CM | POA: Diagnosis not present

## 2016-04-13 DIAGNOSIS — N400 Enlarged prostate without lower urinary tract symptoms: Secondary | ICD-10-CM | POA: Diagnosis present

## 2016-04-13 DIAGNOSIS — J9621 Acute and chronic respiratory failure with hypoxia: Secondary | ICD-10-CM | POA: Diagnosis not present

## 2016-04-13 DIAGNOSIS — E8770 Fluid overload, unspecified: Secondary | ICD-10-CM | POA: Diagnosis not present

## 2016-04-13 DIAGNOSIS — I429 Cardiomyopathy, unspecified: Secondary | ICD-10-CM | POA: Diagnosis present

## 2016-04-13 DIAGNOSIS — A419 Sepsis, unspecified organism: Principal | ICD-10-CM | POA: Diagnosis present

## 2016-04-13 DIAGNOSIS — E860 Dehydration: Secondary | ICD-10-CM | POA: Diagnosis present

## 2016-04-13 DIAGNOSIS — Z880 Allergy status to penicillin: Secondary | ICD-10-CM

## 2016-04-13 DIAGNOSIS — R031 Nonspecific low blood-pressure reading: Secondary | ICD-10-CM | POA: Diagnosis not present

## 2016-04-13 DIAGNOSIS — N184 Chronic kidney disease, stage 4 (severe): Secondary | ICD-10-CM | POA: Diagnosis not present

## 2016-04-13 DIAGNOSIS — E785 Hyperlipidemia, unspecified: Secondary | ICD-10-CM | POA: Diagnosis present

## 2016-04-13 DIAGNOSIS — I480 Paroxysmal atrial fibrillation: Secondary | ICD-10-CM | POA: Diagnosis present

## 2016-04-13 DIAGNOSIS — K219 Gastro-esophageal reflux disease without esophagitis: Secondary | ICD-10-CM | POA: Diagnosis not present

## 2016-04-13 DIAGNOSIS — J810 Acute pulmonary edema: Secondary | ICD-10-CM | POA: Diagnosis present

## 2016-04-13 DIAGNOSIS — N179 Acute kidney failure, unspecified: Secondary | ICD-10-CM | POA: Diagnosis not present

## 2016-04-13 DIAGNOSIS — Z515 Encounter for palliative care: Secondary | ICD-10-CM | POA: Diagnosis not present

## 2016-04-13 DIAGNOSIS — N182 Chronic kidney disease, stage 2 (mild): Secondary | ICD-10-CM | POA: Diagnosis present

## 2016-04-13 DIAGNOSIS — R627 Adult failure to thrive: Secondary | ICD-10-CM | POA: Diagnosis not present

## 2016-04-13 DIAGNOSIS — R6521 Severe sepsis with septic shock: Secondary | ICD-10-CM | POA: Diagnosis present

## 2016-04-13 DIAGNOSIS — R1311 Dysphagia, oral phase: Secondary | ICD-10-CM | POA: Diagnosis present

## 2016-04-13 DIAGNOSIS — B974 Respiratory syncytial virus as the cause of diseases classified elsewhere: Secondary | ICD-10-CM | POA: Diagnosis present

## 2016-04-13 DIAGNOSIS — R05 Cough: Secondary | ICD-10-CM | POA: Diagnosis not present

## 2016-04-13 DIAGNOSIS — J9611 Chronic respiratory failure with hypoxia: Secondary | ICD-10-CM | POA: Diagnosis not present

## 2016-04-13 DIAGNOSIS — Z881 Allergy status to other antibiotic agents status: Secondary | ICD-10-CM

## 2016-04-13 DIAGNOSIS — I130 Hypertensive heart and chronic kidney disease with heart failure and stage 1 through stage 4 chronic kidney disease, or unspecified chronic kidney disease: Secondary | ICD-10-CM | POA: Diagnosis present

## 2016-04-13 DIAGNOSIS — Y95 Nosocomial condition: Secondary | ICD-10-CM | POA: Diagnosis present

## 2016-04-13 DIAGNOSIS — I5023 Acute on chronic systolic (congestive) heart failure: Secondary | ICD-10-CM | POA: Diagnosis not present

## 2016-04-13 DIAGNOSIS — I4891 Unspecified atrial fibrillation: Secondary | ICD-10-CM | POA: Diagnosis present

## 2016-04-13 DIAGNOSIS — R0902 Hypoxemia: Secondary | ICD-10-CM | POA: Diagnosis present

## 2016-04-13 DIAGNOSIS — Z96642 Presence of left artificial hip joint: Secondary | ICD-10-CM | POA: Diagnosis not present

## 2016-04-13 DIAGNOSIS — Z7901 Long term (current) use of anticoagulants: Secondary | ICD-10-CM

## 2016-04-13 DIAGNOSIS — J9601 Acute respiratory failure with hypoxia: Secondary | ICD-10-CM | POA: Diagnosis not present

## 2016-04-13 DIAGNOSIS — Z6822 Body mass index (BMI) 22.0-22.9, adult: Secondary | ICD-10-CM

## 2016-04-13 DIAGNOSIS — N183 Chronic kidney disease, stage 3 (moderate): Secondary | ICD-10-CM | POA: Diagnosis not present

## 2016-04-13 DIAGNOSIS — J9690 Respiratory failure, unspecified, unspecified whether with hypoxia or hypercapnia: Secondary | ICD-10-CM | POA: Diagnosis present

## 2016-04-13 DIAGNOSIS — Z800 Family history of malignant neoplasm of digestive organs: Secondary | ICD-10-CM

## 2016-04-13 DIAGNOSIS — Z8042 Family history of malignant neoplasm of prostate: Secondary | ICD-10-CM

## 2016-04-13 DIAGNOSIS — Z8249 Family history of ischemic heart disease and other diseases of the circulatory system: Secondary | ICD-10-CM

## 2016-04-13 LAB — COMPREHENSIVE METABOLIC PANEL
ALBUMIN: 2.9 g/dL — AB (ref 3.5–5.0)
ALK PHOS: 109 U/L (ref 38–126)
ALT: 10 U/L — ABNORMAL LOW (ref 17–63)
ANION GAP: 16 — AB (ref 5–15)
AST: 15 U/L (ref 15–41)
BILIRUBIN TOTAL: 0.8 mg/dL (ref 0.3–1.2)
BUN: 144 mg/dL — ABNORMAL HIGH (ref 6–20)
CALCIUM: 8.7 mg/dL — AB (ref 8.9–10.3)
CO2: 16 mmol/L — ABNORMAL LOW (ref 22–32)
Chloride: 105 mmol/L (ref 101–111)
Creatinine, Ser: 7.68 mg/dL — ABNORMAL HIGH (ref 0.61–1.24)
GFR calc Af Amer: 6 mL/min — ABNORMAL LOW (ref >60)
GFR calc non Af Amer: 5 mL/min — ABNORMAL LOW (ref >60)
Glucose, Bld: 132 mg/dL — ABNORMAL HIGH (ref 65–99)
POTASSIUM: 4.4 mmol/L (ref 3.5–5.1)
SODIUM: 137 mmol/L (ref 135–145)
TOTAL PROTEIN: 6.5 g/dL (ref 6.5–8.1)

## 2016-04-13 LAB — I-STAT CG4 LACTIC ACID, ED: Lactic Acid, Venous: 1.84 mmol/L (ref 0.5–1.9)

## 2016-04-13 LAB — URINALYSIS, ROUTINE W REFLEX MICROSCOPIC
Bilirubin Urine: NEGATIVE
Glucose, UA: NEGATIVE mg/dL
Hgb urine dipstick: NEGATIVE
KETONES UR: NEGATIVE mg/dL
Leukocytes, UA: NEGATIVE
NITRITE: NEGATIVE
PH: 5 (ref 5.0–8.0)
Protein, ur: NEGATIVE mg/dL
SPECIFIC GRAVITY, URINE: 1.011 (ref 1.005–1.030)

## 2016-04-13 LAB — CBC WITH DIFFERENTIAL/PLATELET
BASOS ABS: 0 10*3/uL (ref 0.0–0.1)
BASOS PCT: 0 %
Eosinophils Absolute: 0 10*3/uL (ref 0.0–0.7)
Eosinophils Relative: 0 %
HEMATOCRIT: 36.6 % — AB (ref 39.0–52.0)
HEMOGLOBIN: 12.6 g/dL — AB (ref 13.0–17.0)
LYMPHS PCT: 14 %
Lymphs Abs: 1.2 10*3/uL (ref 0.7–4.0)
MCH: 29.4 pg (ref 26.0–34.0)
MCHC: 34.4 g/dL (ref 30.0–36.0)
MCV: 85.5 fL (ref 78.0–100.0)
Monocytes Absolute: 0.9 10*3/uL (ref 0.1–1.0)
Monocytes Relative: 10 %
NEUTROS ABS: 6.9 10*3/uL (ref 1.7–7.7)
NEUTROS PCT: 76 %
Platelets: 415 10*3/uL — ABNORMAL HIGH (ref 150–400)
RBC: 4.28 MIL/uL (ref 4.22–5.81)
RDW: 13.5 % (ref 11.5–15.5)
WBC: 9 10*3/uL (ref 4.0–10.5)

## 2016-04-13 LAB — PROTIME-INR
INR: 1.55
Prothrombin Time: 18.7 seconds — ABNORMAL HIGH (ref 11.4–15.2)

## 2016-04-13 MED ORDER — ORAL CARE MOUTH RINSE
15.0000 mL | Freq: Two times a day (BID) | OROMUCOSAL | Status: DC
  Administered 2016-04-13 – 2016-04-17 (×8): 15 mL via OROMUCOSAL

## 2016-04-13 MED ORDER — METOPROLOL SUCCINATE ER 25 MG PO TB24: 25.0000 mg | ORAL_TABLET | Freq: Every day | ORAL | Status: DC

## 2016-04-13 MED ORDER — SODIUM CHLORIDE 0.9 % IV BOLUS (SEPSIS)
1000.0000 mL | Freq: Once | INTRAVENOUS | Status: AC
  Administered 2016-04-13: 1000 mL via INTRAVENOUS

## 2016-04-13 MED ORDER — FUROSEMIDE 10 MG/ML IJ SOLN
40.0000 mg | Freq: Once | INTRAMUSCULAR | Status: AC
Start: 2016-04-13 — End: 2016-04-13
  Administered 2016-04-13: 40 mg via INTRAVENOUS
  Filled 2016-04-13: qty 4

## 2016-04-13 MED ORDER — IPRATROPIUM-ALBUTEROL 0.5-2.5 (3) MG/3ML IN SOLN
3.0000 mL | Freq: Four times a day (QID) | RESPIRATORY_TRACT | Status: DC
  Administered 2016-04-13: 3 mL via RESPIRATORY_TRACT
  Filled 2016-04-13: qty 3

## 2016-04-13 MED ORDER — ALBUTEROL SULFATE (2.5 MG/3ML) 0.083% IN NEBU: 2.5000 mg | INHALATION_SOLUTION | RESPIRATORY_TRACT | Status: DC | PRN

## 2016-04-13 MED ORDER — ACETAMINOPHEN 325 MG PO TABS: 650.0000 mg | ORAL_TABLET | Freq: Four times a day (QID) | ORAL | Status: DC | PRN

## 2016-04-13 MED ORDER — APIXABAN 2.5 MG PO TABS
2.5000 mg | ORAL_TABLET | Freq: Two times a day (BID) | ORAL | Status: DC
  Administered 2016-04-13 – 2016-04-14 (×2): 2.5 mg via ORAL
  Filled 2016-04-13 (×3): qty 1

## 2016-04-13 MED ORDER — DEXTROSE 5 % IV SOLN
500.0000 mg | INTRAVENOUS | Status: DC
  Administered 2016-04-14: 500 mg via INTRAVENOUS
  Filled 2016-04-13: qty 0.5

## 2016-04-13 MED ORDER — PANTOPRAZOLE SODIUM 40 MG PO TBEC
40.0000 mg | DELAYED_RELEASE_TABLET | Freq: Every day | ORAL | Status: DC
  Administered 2016-04-14: 40 mg via ORAL
  Filled 2016-04-13 (×2): qty 1

## 2016-04-13 MED ORDER — DEXTROSE 5 % IV SOLN
2.0000 g | Freq: Once | INTRAVENOUS | Status: AC
  Administered 2016-04-13: 2 g via INTRAVENOUS
  Filled 2016-04-13: qty 2

## 2016-04-13 MED ORDER — VANCOMYCIN HCL IN DEXTROSE 1-5 GM/200ML-% IV SOLN
1000.0000 mg | Freq: Once | INTRAVENOUS | Status: AC
  Administered 2016-04-13: 1000 mg via INTRAVENOUS
  Filled 2016-04-13: qty 200

## 2016-04-13 MED ORDER — FUROSEMIDE 10 MG/ML IJ SOLN: 40.0000 mg | Freq: Two times a day (BID) | INTRAMUSCULAR | Status: DC

## 2016-04-13 MED ORDER — ONDANSETRON HCL 4 MG PO TABS
4.0000 mg | ORAL_TABLET | Freq: Four times a day (QID) | ORAL | Status: DC | PRN
Start: 2016-04-13 — End: 2016-04-17

## 2016-04-13 MED ORDER — SIMVASTATIN 20 MG PO TABS
20.0000 mg | ORAL_TABLET | Freq: Every day | ORAL | Status: DC
  Administered 2016-04-13: 20 mg via ORAL
  Filled 2016-04-13: qty 1

## 2016-04-13 MED ORDER — FUROSEMIDE 10 MG/ML IJ SOLN
40.0000 mg | Freq: Once | INTRAMUSCULAR | Status: AC
  Administered 2016-04-13: 40 mg via INTRAVENOUS
  Filled 2016-04-13: qty 4

## 2016-04-13 MED ORDER — ACETAMINOPHEN 650 MG RE SUPP: 650.0000 mg | Freq: Four times a day (QID) | RECTAL | Status: DC | PRN

## 2016-04-13 MED ORDER — IPRATROPIUM-ALBUTEROL 0.5-2.5 (3) MG/3ML IN SOLN
3.0000 mL | Freq: Three times a day (TID) | RESPIRATORY_TRACT | Status: DC
  Administered 2016-04-14 (×2): 3 mL via RESPIRATORY_TRACT
  Filled 2016-04-13 (×3): qty 3

## 2016-04-13 MED ORDER — TAMSULOSIN HCL 0.4 MG PO CAPS: 0.4000 mg | ORAL_CAPSULE | Freq: Every day | ORAL | Status: DC

## 2016-04-13 MED ORDER — SODIUM CHLORIDE 0.9% FLUSH
3.0000 mL | Freq: Two times a day (BID) | INTRAVENOUS | Status: DC
  Administered 2016-04-13: 10 mL via INTRAVENOUS
  Administered 2016-04-14 – 2016-04-17 (×7): 3 mL via INTRAVENOUS

## 2016-04-13 MED ORDER — APIXABAN 5 MG PO TABS: 5.0000 mg | ORAL_TABLET | Freq: Two times a day (BID) | ORAL | Status: DC

## 2016-04-13 MED ORDER — ONDANSETRON HCL 4 MG/2ML IJ SOLN: 4.0000 mg | Freq: Four times a day (QID) | INTRAMUSCULAR | Status: DC | PRN

## 2016-04-13 NOTE — H&P (Signed)
[facility] History and Physical  [name] [id number] DOB: Mar 25, 1922 DOA: 04/13/2016  Referring physician:  PCP: Jenny Reichmann, MD   Chief Complaint: "You could hear the crackles."  HPI: [name] is a 83 y.o. male  the past medical history significant for A. fib, CK D, reflux, hypertension, IBS, intussusception presents to emergency room with chief complaint of hypoxia. Patient states in skilled nursing facility. He was having respiratory issues was getting evaluated. Patient's son states that he could hear crackles from the patient. X-ray of the chest could not be done due to the snowstorm. Imaging recently done and patient immediately sent to the hospital for evaluation.  ED course: Patient found to be in acute heart failure likely secondary to acute kidney failure. Nephrology consulted and they stated that outlook is bleak.   Review of Systems:  As per HPI otherwise 10 point review of systems negative.    Past Medical History:  Diagnosis Date  . Aspiration pneumonia ([place])   . Atrial fibrillation with RVR ([place])   . BARRETTS ESOPHAGUS 11/19/2007  . Blood transfusion without reported diagnosis   . Cardiomyopathy ([place])   . CHANGE IN BOWELS 02/01/2009  . Chronic combined systolic (congestive) and diastolic (congestive) heart failure    a. echo 2015: EF 45% b. Echo 10/2015: EF 25-30% with diffuse HK  . CKD stage G2/A2, GFR 60 - 89 and albumin creatinine ratio 30 - 299 mg/g   . Compression fracture of L2 lumbar vertebra (HCC)   . Diarrhea 01/31/2009  . DIVERTICULOSIS, COLON 11/19/2007  . 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 GASTRITIS 11/19/2007  . GERD 11/19/2007  . HIATAL HERNIA 11/19/2007  . HOH (hard of hearing)   . HTN (hypertension)   . HYPERLIPIDEMIA 06/27/2009  . Hypokalemia   . Hypomagnesemia 10/2015  . HYPOTHYROIDISM 06/27/2009  . IBS (irritable bowel syndrome)   . Intussusception ([place])   . MELENA 08/01/2009  . Nonischemic cardiomyopathy ([place])    a. NST in 2010  with no evidence of ischemia.  Marland Kitchen PERSONAL HX COLONIC POLYPS 02/01/2009  . Protein-calorie malnutrition, severe ([place])   . TINEA CORPORIS 06/27/2009  . TOBACCO USE, QUIT 06/27/2009  . TREMOR, ESSENTIAL 06/27/2009   Past Surgical History:  Procedure Laterality Date  . BEZOAR EVACUATION  11/09/2015   ILEUM  . CHOLECYSTECTOMY    . ESOPHAGOGASTRODUODENOSCOPY N/A 04/28/2013   Procedure: ESOPHAGOGASTRODUODENOSCOPY (EGD);  Surgeon: Jerene Bears, MD;  Location: [facility];  Service: Gastroenterology;  Laterality: N/A;  . HERNIA REPAIR    . HIP ARTHROPLASTY Left 11/30/2013   Procedure: ARTHROPLASTY BIPOLAR HIP;  Surgeon: [name], MD;  Location: [facility];  Service: Orthopedics;  Laterality: Left;  . HIP PINNING,CANNULATED Right 03/29/2016   Procedure: CANNULATED HIP PINNING;  Surgeon: Nicholes Stairs, MD;  Location: [facility];  Service: Orthopedics;  Laterality: Right;  . LAPAROTOMY N/A 11/09/2015   Procedure: EXPLORATORY LAPAROTOMY;  Surgeon: [name], MD;  Location: [facility];  Service: General;  Laterality: N/A;  . LUNG SURGERY  1984  . TONSILLECTOMY    . UMBILICAL HERNIA REPAIR    . VASECTOMY     Social History:  reports that he quit smoking about 73 years ago. His smoking use included Cigarettes. He has a 72.00 pack-year smoking history. He has never used smokeless tobacco. He reports that he drinks about 0.6 oz of alcohol per week . He reports that he does not use drugs.  Allergies  Allergen Reactions  . Ciprofloxacin Other (See  Comments)    Reaction:  Unknown   . Penicillins Rash and Other (See Comments)    Tolerated Unasyn 11/03/15 Has patient had a PCN reaction causing immediate rash, facial/tongue/throat swelling, SOB or lightheadedness with hypotension: No Has patient had a PCN reaction causing severe rash involving mucus membranes or skin necrosis: No Has patient had a PCN reaction that required hospitalization No Has patient had a PCN reaction occurring within the last 10 years:  No If all of the above answers are "NO", then may proceed with Cephalosporin use.    Family History  Problem Relation Age of Onset  . Heart disease Mother   . Colon cancer Father   . Prostate cancer Brother      Prior to Admission medications   Medication Sig Start Date End Date Taking? Authorizing Provider  acidophilus (RISAQUAD) CAPS capsule Take 1 capsule by mouth daily.     Historical Provider, MD  apixaban (ELIQUIS) 5 MG TABS tablet Take 1 tablet (5 mg total) by mouth 2 (two) times daily. 11/16/15   Nishant Dhungel, MD  furosemide (LASIX) 20 MG tablet Take 20-40 mg by mouth as directed. Alternate Between Taking 20 mg one day Then Taking 40 mg the next day    Historical Provider, MD  lisinopril-hydrochlorothiazide (PRINZIDE,ZESTORETIC) 10-12.5 MG tablet Take 1 tablet by mouth daily. 04/02/16   Charlynne Cousins, MD  metoprolol succinate (TOPROL-XL) 25 MG 24 hr tablet TAKE 1 TABLET BY MOUTH  DAILY WITH BREAKFAST 01/13/16   Fay Records, MD  Multiple Vitamins-Minerals (PRESERVISION AREDS 2) CAPS Take 1 capsule by mouth 2 (two) times daily.     Historical Provider, MD  omeprazole (PRILOSEC) 20 MG capsule Take 20 mg by mouth daily.    Historical Provider, MD  potassium chloride (K-DUR,KLOR-CON) 10 MEQ tablet Take 10 mEq by mouth daily.    Historical Provider, MD  Protein (PROCEL) POWD Take 2 scoop by mouth 2 (two) times daily.    Historical Provider, MD  simvastatin (ZOCOR) 20 MG tablet Take 20 mg by mouth at bedtime.     Historical Provider, MD  tamsulosin (FLOMAX) 0.4 MG CAPS capsule Take 0.4 mg by mouth daily after supper.     Historical Provider, MD  traMADol-acetaminophen (ULTRACET) 37.5-325 MG tablet Take one tablet by mouth every 6 hours as needed for pain. Do not exceed 4gm of Tylenol in 24 hours 04/02/16   [place], DO  UNABLE TO FIND Med Name: MedPass 120 mL po TID for nutritional support    Historical Provider, MD   Physical Exam: Vitals:   04/13/16 1730 04/13/16 1800  04/13/16 1900 04/13/16 1942  BP: (!) 97/49 (!) 103/51  (!) 101/55  Pulse: 72 69 72 72  Resp: 20 21 (!) 23 (!) 23  Temp:  98.2 F (36.8 C)  98.1 F (36.7 C)  TempSrc:  Axillary  Axillary  SpO2: 96% 99% 100% 99%  Weight:      Height:        Wt Readings from Last 3 Encounters:  04/13/16 61.2 kg (135 lb)  04/03/16 61.2 kg (135 lb)  04/02/16 56.1 kg (123 lb 9.6 oz)    General:  Appears calm and comfortable, Alert and oriented 2 Eyes:  PERRL, EOMI, normal lids, iris ENT:  grossly normal hearing, lips & tongue Neck:  no LAD, masses or thyromegaly Cardiovascular:  RRR, no m/r/g. No LE edema.  Respiratory:  In acute respiratory distress on nonrebreather, audible wheezing Abdomen:  soft, ntnd Skin:  no rash or induration seen on limited exam Musculoskeletal:  grossly normal tone BUE/BLE Psychiatric:  grossly normal mood and affect, speech fluent and appropriate Neurologic:  CN 2-12 grossly intact, moves all extremities in coordinated fashion.          Labs on Admission:  Basic Metabolic Panel:  Recent Labs Lab 04/13/16 1411  NA 137  K 4.4  CL 105  CO2 16*  GLUCOSE 132*  BUN 144*  CREATININE 7.68*  CALCIUM 8.7*   Liver Function Tests:  Recent Labs Lab 04/13/16 1411  AST 15  ALT 10*  ALKPHOS 109  BILITOT 0.8  PROT 6.5  ALBUMIN 2.9*   No results for input(s): LIPASE, AMYLASE in the last 168 hours. No results for input(s): AMMONIA in the last 168 hours. CBC:  Recent Labs Lab 04/13/16 1411  WBC 9.0  NEUTROABS 6.9  HGB 12.6*  HCT 36.6*  MCV 85.5  PLT 415*   Cardiac Enzymes: No results for input(s): CKTOTAL, CKMB, CKMBINDEX, TROPONINI in the last 168 hours.  BNP (last 3 results) No results for input(s): BNP in the last 8760 hours.  ProBNP (last 3 results) No results for input(s): PROBNP in the last 8760 hours.   Serum creatinine: 7.68 mg/dL High 04/13/16 1411 Estimated creatinine clearance: 5.3 mL/min  CBG: No results for input(s): GLUCAP in the  last 168 hours.  Radiological Exams on Admission: Dg Chest Port 1 View  Result Date: 04/13/2016 CLINICAL DATA:  Cough and hypoxia. EXAM: PORTABLE CHEST 1 VIEW COMPARISON:  03/27/2016 FINDINGS: Low volume film. The cardio pericardial silhouette is enlarged. Pulmonary vasculature is congested with diffuse interstitial opacities suggesting edema. The visualized bony structures of the thorax are intact. Telemetry leads overlie the chest. IMPRESSION: Cardiomegaly with pulmonary edema. Calcified pleural plaques suggest prior asbestos exposure. Electronically Signed   By: Misty Stanley M.D.   On: 04/13/2016 14:26    EKG: Independently reviewed. No sTEMI  Assessment/Plan Active Problems:   Respiratory failure (HCC)   Respiratory failure Secondary to pulmonary edema from acute kidney injury Patient with long-standing kidney disease, could be likely normal progression versus acute event Nephrology on board Check creatinine in morning Strict I's and O's Lasix twice a day  AKI Renal following No HD per pt's wishes  Reported as pneumonia and outpatient Continue antibiotics, vanc and cefepime Scheduled breathing treatments and when necessary albuterol  GERD Cont PPI  Hyperlipidemia Continue statin  Afib  Cont eliquis and BB  BPH Cont flomax  Code Status: DNR  DVT Prophylaxis: eliquis Family Communication: at bedside Disposition Plan: Pending Improvement  Status: SDU, inpt  Elwin Mocha, MD Family Medicine [facility] [email or web address] Password [email or web address]

## 2016-04-13 NOTE — Progress Notes (Signed)
Pharmacy Antibiotic Note  [name] is a 83 y.o. male admitted on 04/13/2016 with pneumonia.  Pharmacy has been consulted for vancomycin and cefepime dosing. Tmax is 100.9 and WBC is WNL. Lactic acid is <2. SCr is significantly elevated above baseline at 7.68.   Plan: Vanc 1gm IV x 1 - will re-dose based on levels and kidney function Cefepime 2gm IV x 1 then 500mg  IV Q24H F/u renal fxn, C&S, clinical status and trough at SS  Height: 5\' 5"  (165.1 cm) Weight: 135 lb (61.2 kg) IBW/kg (Calculated) : 61.5  Temp (24hrs), Avg:100.9 F (38.3 C), Min:100.9 F (38.3 C), Max:100.9 F (38.3 C)   Recent Labs Lab 04/13/16 1411 04/13/16 1437  WBC 9.0  --   CREATININE 7.68*  --   LATICACIDVEN  --  1.84    Estimated Creatinine Clearance: 5.3 mL/min (by C-G formula based on SCr of 7.68 mg/dL (H)).    Allergies  Allergen Reactions  . Ciprofloxacin Other (See Comments)    Reaction:  Unknown   . Penicillins Rash and Other (See Comments)    Tolerated Unasyn 11/03/15 Has patient had a PCN reaction causing immediate rash, facial/tongue/throat swelling, SOB or lightheadedness with hypotension: No Has patient had a PCN reaction causing severe rash involving mucus membranes or skin necrosis: No Has patient had a PCN reaction that required hospitalization No Has patient had a PCN reaction occurring within the last 10 years: No If all of the above answers are "NO", then may proceed with Cephalosporin use.    Antimicrobials this admission: Vanc 1/19>> Cefepime 1/19>>  Dose adjustments this admission: N/A  Microbiology results: Pending  Thank you for allowing pharmacy to be a part of this patient's care.  Adaira Centola, Rande Lawman 04/13/2016 3:12 PM

## 2016-04-13 NOTE — ED Provider Notes (Signed)
[facility] DEPT Provider Note   CSN: [id number] Arrival date & time:        History   Chief Complaint Chief Complaint  Patient presents with  . Code Sepsis    HPI LYONEL BLANE is a 83 y.o. male With a past medical history significant for atrial fibrillation, CKD, hypertension, GERD, and diagnosis of pneumonia yesterday who presents for altered mental status and hypoxia. According to EMS, patient was found to be hypoxic with an oxygen saturation In the 70s on room air. The EMS crew also reports that normally, the  Patient is very talkative, However, he has been more quiet today. Patient also found to be hypotensive by EMS And had a systolic blood pressure of 82 on their arrival. Patient reports congestion, productive cough, chills, but denies any chest pain or palpitations. He thinks he may be urinating less as well.   HPI  Past Medical History:  Diagnosis Date  . Aspiration pneumonia ([place])   . Atrial fibrillation with RVR ([place])   . BARRETTS ESOPHAGUS 11/19/2007  . Blood transfusion without reported diagnosis   . Cardiomyopathy ([place])   . CHANGE IN BOWELS 02/01/2009  . Chronic combined systolic (congestive) and diastolic (congestive) heart failure    a. echo 2015: EF 45% b. Echo 10/2015: EF 25-30% with diffuse HK  . CKD stage G2/A2, GFR 60 - 89 and albumin creatinine ratio 30 - 299 mg/g   . Compression fracture of L2 lumbar vertebra (HCC)   . Diarrhea 01/31/2009  . DIVERTICULOSIS, COLON 11/19/2007  . 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 GASTRITIS 11/19/2007  . GERD 11/19/2007  . HIATAL HERNIA 11/19/2007  . HOH (hard of hearing)   . HTN (hypertension)   . HYPERLIPIDEMIA 06/27/2009  . Hypokalemia   . Hypomagnesemia 10/2015  . HYPOTHYROIDISM 06/27/2009  . IBS (irritable bowel syndrome)   . Intussusception ([place])   . MELENA 08/01/2009  . Nonischemic cardiomyopathy ([place])    a. NST in 2010 with no evidence of ischemia.  Marland Kitchen PERSONAL HX COLONIC POLYPS 02/01/2009  . Protein-calorie  malnutrition, severe ([place])   . TINEA CORPORIS 06/27/2009  . TOBACCO USE, QUIT 06/27/2009  . TREMOR, ESSENTIAL 06/27/2009    Patient Active Problem List   Diagnosis Date Noted  . Closed right hip fracture, initial encounter ([place]) 03/27/2016  . Hip fracture ([place]) 03/27/2016  . Acute on chronic systolic heart failure ([place]) 11/16/2015  . Hypokalemia 11/16/2015  . Hard of hearing 11/16/2015  . 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 Cardiomyopathy ([place])   . Acute respiratory failure ([place])   . Small bowel obstruction from bezoar s/p ex lap & evacuation 11/09/2015 11/03/2015  . Intussusception ([place]) 11/03/2015  . Compression fracture of L2 lumbar vertebra ([place]) 03/16/2015  . Abrasion 03/05/2015  . Lipoma of back 03/01/2015  . Paroxysmal atrial fibrillation ([place]) 12/10/2013  . Hyponatremia 12/03/2013  . Leukocytosis, unspecified 12/02/2013  . Femoral neck fracture ([place]) 11/30/2013  . Acute on chronic renal failure ([place]) 11/30/2013  . Chronic combined systolic and diastolic CHF (congestive heart failure) ([place]) 11/30/2013  . A-fib ([place]) 07/11/2013  . Protein-calorie malnutrition, severe ([place]) 07/10/2013  . Acute encephalopathy 07/10/2013  . CAP (community acquired pneumonia) 07/09/2013  . CKD (chronic kidney disease) stage 2, GFR 60-89 ml/min 07/09/2013  . Metabolic acidosis [id number]  . Septic shock ([place]) 07/09/2013  . BPH (benign prostatic hyperplasia) 02/04/2012  . MELENA 08/01/2009  . TINEA CORPORIS 06/27/2009  . Hyperlipemia 06/27/2009  . TREMOR, ESSENTIAL  06/27/2009  . Essential hypertension 06/27/2009  . TOBACCO USE, QUIT 06/27/2009  . PERSONAL HX COLONIC POLYPS 02/01/2009  . DIARRHEA 01/31/2009  . GERD 11/19/2007  . BARRETTS ESOPHAGUS 11/19/2007  . GASTRITIS 11/19/2007  . HIATAL HERNIA 11/19/2007  . DIVERTICULOSIS, COLON 11/19/2007    Past Surgical History:  Procedure Laterality Date  . BEZOAR EVACUATION  11/09/2015   ILEUM  . CHOLECYSTECTOMY      . ESOPHAGOGASTRODUODENOSCOPY N/A 04/28/2013   Procedure: ESOPHAGOGASTRODUODENOSCOPY (EGD);  Surgeon: Jerene Bears, MD;  Location: [facility];  Service: Gastroenterology;  Laterality: N/A;  . HERNIA REPAIR    . HIP ARTHROPLASTY Left 11/30/2013   Procedure: ARTHROPLASTY BIPOLAR HIP;  Surgeon: [name], MD;  Location: [facility];  Service: Orthopedics;  Laterality: Left;  . HIP PINNING,CANNULATED Right 03/29/2016   Procedure: CANNULATED HIP PINNING;  Surgeon: Nicholes Stairs, MD;  Location: [facility];  Service: Orthopedics;  Laterality: Right;  . LAPAROTOMY N/A 11/09/2015   Procedure: EXPLORATORY LAPAROTOMY;  Surgeon: [name], MD;  Location: [facility];  Service: General;  Laterality: N/A;  . LUNG SURGERY  1984  . TONSILLECTOMY    . UMBILICAL HERNIA REPAIR    . VASECTOMY         Home Medications    Prior to Admission medications   Medication Sig Start Date End Date Taking? Authorizing Provider  acidophilus (RISAQUAD) CAPS capsule Take 1 capsule by mouth daily.     Historical Provider, MD  apixaban (ELIQUIS) 5 MG TABS tablet Take 1 tablet (5 mg total) by mouth 2 (two) times daily. 11/16/15   Nishant Dhungel, MD  furosemide (LASIX) 20 MG tablet Take 20-40 mg by mouth as directed. Alternate Between Taking 20 mg one day Then Taking 40 mg the next day    Historical Provider, MD  lisinopril-hydrochlorothiazide (PRINZIDE,ZESTORETIC) 10-12.5 MG tablet Take 1 tablet by mouth daily. 04/02/16   Charlynne Cousins, MD  metoprolol succinate (TOPROL-XL) 25 MG 24 hr tablet TAKE 1 TABLET BY MOUTH  DAILY WITH BREAKFAST 01/13/16   Fay Records, MD  Multiple Vitamins-Minerals (PRESERVISION AREDS 2) CAPS Take 1 capsule by mouth 2 (two) times daily.     Historical Provider, MD  omeprazole (PRILOSEC) 20 MG capsule Take 20 mg by mouth daily.    Historical Provider, MD  potassium chloride (K-DUR,KLOR-CON) 10 MEQ tablet Take 10 mEq by mouth daily.    Historical Provider, MD  Protein (PROCEL) POWD Take 2 scoop  by mouth 2 (two) times daily.    Historical Provider, MD  simvastatin (ZOCOR) 20 MG tablet Take 20 mg by mouth at bedtime.     Historical Provider, MD  tamsulosin (FLOMAX) 0.4 MG CAPS capsule Take 0.4 mg by mouth daily after supper.     Historical Provider, MD  traMADol-acetaminophen (ULTRACET) 37.5-325 MG tablet Take one tablet by mouth every 6 hours as needed for pain. Do not exceed 4gm of Tylenol in 24 hours 04/02/16   [place], DO  UNABLE TO FIND Med Name: MedPass 120 mL po TID for nutritional support    Historical Provider, MD    Family History Family History  Problem Relation Age of Onset  . Heart disease Mother   . Colon cancer Father   . Prostate cancer Brother     Social History Social History  Substance Use Topics  . Smoking status: Former Smoker    Packs/day: 3.00    Years: 24.00    Types: Cigarettes    Quit date: 03/27/1943  .  Smokeless tobacco: Never Used     Comment: Widowed x 2, has lady friend  . Alcohol use 0.6 oz/week    1 Standard drinks or equivalent per week     Comment: occassional     Allergies   Ciprofloxacin and Penicillins   Review of Systems Review of Systems  Constitutional: Positive for chills. Negative for activity change, diaphoresis, fatigue and fever.  HENT: Positive for congestion. Negative for rhinorrhea.   Eyes: Negative for visual disturbance.  Respiratory: Positive for cough and shortness of breath. Negative for chest tightness, wheezing and stridor.   Cardiovascular: Positive for leg swelling. Negative for chest pain and palpitations.  Gastrointestinal: Negative for abdominal distention, abdominal pain, blood in stool, constipation, diarrhea, nausea and vomiting.  Genitourinary: Negative for difficulty urinating, dysuria and flank pain.  Musculoskeletal: Negative for back pain and gait problem.  Skin: Negative for rash and wound.  Neurological: Negative for dizziness, weakness, light-headedness and headaches.   Psychiatric/Behavioral: Negative for agitation.  All other systems reviewed and are negative.    Physical Exam Updated Vital Signs BP (!) 99/51   Pulse 70   Temp 97.8 F (36.6 C) (Oral)   Resp 20   Ht 5\' 5"  (1.651 m)   Wt 135 lb (61.2 kg)   SpO2 94%   BMI 22.47 kg/m   Physical Exam  Constitutional: He appears well-developed and well-nourished.  HENT:  Head: Normocephalic and atraumatic.  Mouth/Throat: No oropharyngeal exudate.  Eyes: Conjunctivae are normal. Pupils are equal, round, and reactive to light.  Neck: Normal range of motion. Neck supple.  Cardiovascular: Normal rate and regular rhythm.   No murmur heard. Pulmonary/Chest: Tachypnea noted. He is in respiratory distress. He has rhonchi. He has rales.  Abdominal: Soft. There is no tenderness.  Musculoskeletal: He exhibits no edema.  Neurological: He is alert.  Skin: Skin is warm and dry.  Psychiatric: He has a normal mood and affect.  Nursing note and vitals reviewed.    ED Treatments / Results  Labs (all labs ordered are listed, but only abnormal results are displayed) Labs Reviewed  RESPIRATORY PANEL BY PCR - Abnormal; Notable for the following:       Result Value   Respiratory Syncytial Virus DETECTED (*)    All other components within normal limits  COMPREHENSIVE METABOLIC PANEL - Abnormal; Notable for the following:    CO2 16 (*)    Glucose, Bld 132 (*)    BUN 144 (*)    Creatinine, Ser 7.68 (*)    Calcium 8.7 (*)    Albumin 2.9 (*)    ALT 10 (*)    GFR calc non Af Amer 5 (*)    GFR calc Af Amer 6 (*)    Anion gap 16 (*)    All other components within normal limits  CBC WITH DIFFERENTIAL/PLATELET - Abnormal; Notable for the following:    Hemoglobin 12.6 (*)    HCT 36.6 (*)    Platelets 415 (*)    All other components within normal limits  PROTIME-INR - Abnormal; Notable for the following:    Prothrombin Time 18.7 (*)    All other components within normal limits  BASIC METABOLIC PANEL -  Abnormal; Notable for the following:    CO2 14 (*)    BUN 134 (*)    Creatinine, Ser 6.35 (*)    Calcium 8.8 (*)    GFR calc non Af Amer 7 (*)    GFR calc Af Amer 8 (*)  All other components within normal limits  CBC - Abnormal; Notable for the following:    RBC 4.09 (*)    Hemoglobin 11.9 (*)    HCT 35.3 (*)    All other components within normal limits  CULTURE, BLOOD (ROUTINE X 2)  CULTURE, BLOOD (ROUTINE X 2)  URINE CULTURE  CULTURE, EXPECTORATED SPUTUM-ASSESSMENT  URINALYSIS, ROUTINE W REFLEX MICROSCOPIC  BASIC METABOLIC PANEL  VANCOMYCIN, TROUGH  I-STAT CG4 LACTIC ACID, ED  I-STAT CG4 LACTIC ACID, ED    EKG  EKG Interpretation  Date/Time:  Friday April 13 2016 13:45:40 EST Ventricular Rate:  80 PR Interval:    QRS Duration: 104 QT Interval:  358 QTC Calculation: 413 R Axis:   -30 Text Interpretation:  Sinus rhythm Multiform ventricular premature complexes Left axis deviation Anterior infarct, old ST elevation, consider inferior injury Larger T waves than prior No STEMI Confirmed by Sherry Ruffing MD, Jayra Choyce [phone]) on 04/13/2016 5:08:13 PM       Radiology Dg Chest Port 1 View  Result Date: 04/13/2016 CLINICAL DATA:  Cough and hypoxia. EXAM: PORTABLE CHEST 1 VIEW COMPARISON:  03/27/2016 FINDINGS: Low volume film. The cardio pericardial silhouette is enlarged. Pulmonary vasculature is congested with diffuse interstitial opacities suggesting edema. The visualized bony structures of the thorax are intact. Telemetry leads overlie the chest. IMPRESSION: Cardiomegaly with pulmonary edema. Calcified pleural plaques suggest prior asbestos exposure. Electronically Signed   By: Misty Stanley M.D.   On: 04/13/2016 14:26   Dg Swallowing Func-speech Pathology  Result Date: 04/14/2016 Objective Swallowing Evaluation: Type of Study: MBS-Modified Barium Swallow Study Patient Details Name: JAMELE SCHWEDE MRN: [id number] Date of Birth: Jan 28, 1922 Today's Date: 04/14/2016 Time: SLP  Start Time (ACUTE ONLY): 1230-SLP Stop Time (ACUTE ONLY): 1248 SLP Time Calculation (min) (ACUTE ONLY): 18 min Past Medical History: Past Medical History: Diagnosis Date . Aspiration pneumonia ([place])  . Atrial fibrillation with RVR ([place])  . BARRETTS ESOPHAGUS 11/19/2007 . Blood transfusion without reported diagnosis  . Cardiomyopathy ([place])  . CHANGE IN BOWELS 02/01/2009 . Chronic combined systolic (congestive) and diastolic (congestive) heart failure   a. echo 2015: EF 45% b. Echo 10/2015: EF 25-30% with diffuse HK . CKD stage G2/A2, GFR 60 - 89 and albumin creatinine ratio 30 - 299 mg/g  . Compression fracture of L2 lumbar vertebra (HCC)  . Diarrhea 01/31/2009 . DIVERTICULOSIS, COLON 11/19/2007 . Gastric bezoar 11/09/2015 . GASTRITIS 11/19/2007 . GERD 11/19/2007 . HIATAL HERNIA 11/19/2007 . HOH (hard of hearing)  . HTN (hypertension)  . HYPERLIPIDEMIA 06/27/2009 . Hypokalemia  . Hypomagnesemia 10/2015 . HYPOTHYROIDISM 06/27/2009 . IBS (irritable bowel syndrome)  . Intussusception ([place])  . MELENA 08/01/2009 . Nonischemic cardiomyopathy ([place])   a. NST in 2010 with no evidence of ischemia. Marland Kitchen PERSONAL HX COLONIC POLYPS 02/01/2009 . Protein-calorie malnutrition, severe ([place])  . TINEA CORPORIS 06/27/2009 . TOBACCO USE, QUIT 06/27/2009 . TREMOR, ESSENTIAL 06/27/2009 Past Surgical History: Past Surgical History: Procedure Laterality Date . BEZOAR EVACUATION  11/09/2015  ILEUM . CHOLECYSTECTOMY   . ESOPHAGOGASTRODUODENOSCOPY N/A 04/28/2013  Procedure: ESOPHAGOGASTRODUODENOSCOPY (EGD);  Surgeon: Jerene Bears, MD;  Location: [facility];  Service: Gastroenterology;  Laterality: N/A; . HERNIA REPAIR   . HIP ARTHROPLASTY Left 11/30/2013  Procedure: ARTHROPLASTY BIPOLAR HIP;  Surgeon: [name], MD;  Location: [facility];  Service: Orthopedics;  Laterality: Left; . HIP PINNING,CANNULATED Right 03/29/2016  Procedure: CANNULATED HIP PINNING;  Surgeon: Nicholes Stairs, MD;  Location: [facility];  Service: Orthopedics;  Laterality: Right; . LAPAROTOMY N/A  11/09/2015  Procedure: EXPLORATORY LAPAROTOMY;  Surgeon: [name], MD;  Location: [facility];  Service: General;  Laterality: N/A; . LUNG SURGERY  1984 . TONSILLECTOMY   . UMBILICAL HERNIA REPAIR   . VASECTOMY   HPI: Danek Wiederkehr Bowmanis a 83 y.o.malethe past medical history significant for A. fib, CK D, essential tremor, barretts esophagus, GERD, aspiration pneumonia, hypertension, IBS, intussusception presents to emergency room with chief complaint of hypoxia. Admitted with sepsis due to URI, acute respiratory failure with hypoxia, acute on chronic systolic heart failure, acute pulmonary edema. During in prior admission patient was evaluated at bedside by SLP, placed on nectar-thick liquids only 2/2 overt signs of aspiration with thin liquids and pureed. SLP recommended advancement to thin liquids/ dysphagia 3 prior to discharge. Subjective: Alert, oriented to self only Assessment / Plan / Recommendation CHL IP CLINICAL IMPRESSIONS 04/14/2016 Therapy Diagnosis Moderate oral phase dysphagia;Suspected primary esophageal dysphagia;Moderate pharyngeal phase dysphagia;Severe pharyngeal phase dysphagia Clinical Impression Patient presents with moderate oral and moderately-severe pharyngeal multifactorial dysphagia. Aspiration occurred with thin and nectar thick liquids and pureed solids as a result of severe residual remaining in the pharynx which was penetrated after the swallow and aspirated during subsequent swallows. Oral stage of swallowing noted for reduced lingual coordination, oral holding, lingual pumping and delayed anterior to posterior transit. Delayed swallow initiation after prolonged pooling of material in the valleculae. Reduced amplitude of UES opening noted resulting in poor bolus flow and incomplete clearance of material, which remained in the CP segment, was penetrated over the arytenoids and eventually aspirated. Patient with inconsistent response to penetration/aspiration; coughed x1 which  initially cleared the airway, however material was once again penetrated/aspirated. Cued patient for multiple dry swallows and effortful swallows which did not significantly reduce residue. Patient intermittently follows commands. Recommend remaining NPO at this time as patient is severe aspiration risk due to current cognitive status, hx of pneumonia and esophageal issues, decreased respiratory status. May need temporary alternative means of nutrition/hydration. Patient would benefit from GI evaluation given history of GERD, Barrett's esophagus, and poor clearance of bolus through UES.  Impact on safety and function Severe aspiration risk   CHL IP TREATMENT RECOMMENDATION 04/14/2016 Treatment Recommendations Therapy as outlined in treatment plan below   Prognosis 04/14/2016 Prognosis for Safe Diet Advancement Fair Barriers to Reach Goals Severity of deficits Barriers/Prognosis Comment -- CHL IP DIET RECOMMENDATION 04/14/2016 SLP Diet Recommendations NPO Liquid Administration via -- Medication Administration Via alternative means Compensations -- Postural Changes --   CHL IP OTHER RECOMMENDATIONS 04/14/2016 Recommended Consults Consider GI evaluation;Consider esophageal assessment Oral Care Recommendations Oral care QID Other Recommendations --   CHL IP FOLLOW UP RECOMMENDATIONS 04/14/2016 Follow up Recommendations Skilled Nursing facility   [facility] IP FREQUENCY AND DURATION 04/14/2016 Speech Therapy Frequency (ACUTE ONLY) min 2x/week Treatment Duration 2 weeks      CHL IP ORAL PHASE 04/14/2016 Oral Phase Impaired Oral - Pudding Teaspoon -- Oral - Pudding Cup -- Oral - Honey Teaspoon -- Oral - Honey Cup -- Oral - Nectar Teaspoon Holding of bolus;Reduced posterior propulsion;Lingual pumping;Weak lingual manipulation;Delayed oral transit Oral - Nectar Cup -- Oral - Nectar Straw -- Oral - Thin Teaspoon Lingual pumping;Weak lingual manipulation;Holding of bolus;Delayed oral transit Oral - Thin Cup -- Oral - Thin Straw -- Oral -  Puree Weak lingual manipulation;Holding of bolus;Lingual/palatal residue;Decreased bolus cohesion;Delayed oral transit Oral - Mech Soft -- Oral - Regular -- Oral - Multi-Consistency -- Oral - Pill -- Oral Phase - Comment --  CHL IP PHARYNGEAL  PHASE 04/14/2016 Pharyngeal Phase Impaired Pharyngeal- Pudding Teaspoon -- Pharyngeal -- Pharyngeal- Pudding Cup -- Pharyngeal -- Pharyngeal- Honey Teaspoon -- Pharyngeal -- Pharyngeal- Honey Cup -- Pharyngeal -- Pharyngeal- Nectar Teaspoon Delayed swallow initiation-vallecula;Reduced tongue base retraction;Pharyngeal residue - cp segment;Pharyngeal residue - valleculae;Pharyngeal residue - pyriform;Penetration/Aspiration during swallow;Penetration/Apiration after swallow Pharyngeal Material enters airway, passes BELOW cords without attempt by patient to eject out (silent aspiration) Pharyngeal- Nectar Cup -- Pharyngeal -- Pharyngeal- Nectar Straw -- Pharyngeal -- Pharyngeal- Thin Teaspoon Delayed swallow initiation-vallecula;Penetration/Aspiration during swallow;Penetration/Apiration after swallow;Reduced tongue base retraction;Pharyngeal residue - pyriform;Pharyngeal residue - valleculae;Pharyngeal residue - cp segment Pharyngeal Material enters airway, passes BELOW cords without attempt by patient to eject out (silent aspiration);Material enters airway, passes BELOW cords then ejected out Pharyngeal- Thin Cup -- Pharyngeal -- Pharyngeal- Thin Straw -- Pharyngeal -- Pharyngeal- Puree Penetration/Apiration after swallow;Delayed swallow initiation-vallecula;Pharyngeal residue - valleculae;Pharyngeal residue - cp segment;Pharyngeal residue - posterior pharnyx;Penetration/Aspiration during swallow;Reduced tongue base retraction Pharyngeal Material enters airway, passes BELOW cords without attempt by patient to eject out (silent aspiration) Pharyngeal- Mechanical Soft -- Pharyngeal -- Pharyngeal- Regular -- Pharyngeal -- Pharyngeal- Multi-consistency -- Pharyngeal -- Pharyngeal-  Pill -- Pharyngeal -- Pharyngeal Comment --  CHL IP CERVICAL ESOPHAGEAL PHASE 04/14/2016 Cervical Esophageal Phase Impaired Pudding Teaspoon -- Pudding Cup -- Honey Teaspoon -- Honey Cup -- Nectar Teaspoon Reduced cricopharyngeal relaxation Nectar Cup -- Nectar Straw -- Thin Teaspoon Reduced cricopharyngeal relaxation Thin Cup -- Thin Straw -- Puree Reduced cricopharyngeal relaxation Mechanical Soft -- Regular -- Multi-consistency -- Pill -- Cervical Esophageal Comment -- Deneise Lever, MS CF-SLP Speech-Language Pathologist [phone] No flowsheet data found. Aliene Altes 04/14/2016, 2:21 PM               Procedures Procedures (including critical care time)  CRITICAL CARE Performed by: Gwenyth Allegra Yvett Rossel Total critical care time: 35 minutes Critical care time was exclusive of separately billable procedures and treating other patients. Critical care was necessary to treat or prevent imminent or life-threatening deterioration. Critical care was time spent personally by me on the following activities: development of treatment plan with patient and/or surrogate as well as nursing, discussions with consultants, evaluation of patient's response to treatment, examination of patient, obtaining history from patient or surrogate, ordering and performing treatments and interventions, ordering and review of laboratory studies, ordering and review of radiographic studies, pulse oximetry and re-evaluation of patient's condition.   Medications Ordered in ED Medications  ceFEPIme (MAXIPIME) 500 mg in dextrose 5 % 50 mL IVPB (500 mg Intravenous Given 04/14/16 1746)  albuterol (PROVENTIL) (2.5 MG/3ML) 0.083% nebulizer solution 2.5 mg (not administered)  pantoprazole (PROTONIX) EC tablet 40 mg (40 mg Oral Given 04/14/16 0911)  metoprolol succinate (TOPROL-XL) 24 hr tablet 25 mg (0 mg Oral Hold 04/14/16 0728)  simvastatin (ZOCOR) tablet 20 mg (20 mg Oral Given 04/13/16 2311)  tamsulosin (FLOMAX) capsule 0.4 mg (0.4  mg Oral Not Given 04/14/16 1800)  sodium chloride flush (NS) 0.9 % injection 3 mL (3 mLs Intravenous Given 04/14/16 0912)  acetaminophen (TYLENOL) tablet 650 mg (not administered)    Or  acetaminophen (TYLENOL) suppository 650 mg (not administered)  ondansetron (ZOFRAN) tablet 4 mg (not administered)    Or  ondansetron (ZOFRAN) injection 4 mg (not administered)  apixaban (ELIQUIS) tablet 2.5 mg (2.5 mg Oral Given 04/14/16 0911)  MEDLINE mouth rinse (15 mLs Mouth Rinse Given 04/14/16 0912)  ipratropium-albuterol (DUONEB) 0.5-2.5 (3) MG/3ML nebulizer solution 3 mL (3 mLs Nebulization Given 04/14/16 1439)  sodium chloride 0.9 %  bolus 1,000 mL (0 mLs Intravenous Stopped 04/13/16 1447)    And  sodium chloride 0.9 % bolus 1,000 mL (0 mLs Intravenous Stopped 04/13/16 1722)  ceFEPIme (MAXIPIME) 2 g in dextrose 5 % 50 mL IVPB (0 g Intravenous Stopped 04/13/16 1517)  vancomycin (VANCOCIN) IVPB 1000 mg/200 mL premix (0 mg Intravenous Stopped 04/13/16 1722)  furosemide (LASIX) injection 40 mg (40 mg Intravenous Given 04/13/16 1831)  furosemide (LASIX) injection 40 mg (40 mg Intravenous Given 04/13/16 2317)  sodium chloride 0.9 % bolus 1,000 mL (1,000 mLs Intravenous Given 04/14/16 0006)  sodium chloride 0.9 % bolus 1,000 mL (1,000 mLs Intravenous Given 04/14/16 0305)  sodium chloride 0.9 % bolus 500 mL (500 mLs Intravenous Given 04/14/16 0726)     Initial Impression / Assessment and Plan / ED Course  I have reviewed the triage vital signs and the nursing notes.  Pertinent labs & imaging results that were available during my care of the patient were reviewed by me and considered in my medical decision making (see chart for details).     LAFAYETTE MEECH is a 83 y.o. male With a past medical history significant for atrial fibrillation, CKD, hypertension, GERD, and diagnosis of pneumonia yesterday who presents for altered mental status and hypoxia.  Had improvement in hypoxia with non-rebreather.   Shortly  after arrival, patient made a code sepsis due to hypoxia, hypotension and known pneumonia. Patient had fluids ordered as well as lab testing. Patient had cultures obtained and  Broad-spectrum antibiotics were ordered.  Workup results revealed elevated creatinine compared to prior. Patient also had elevated BUN, this might explain his altered mental status.   Chest x-ray showed pulmonary edema and lung exam revealed crackles. Clinical concern for congestive heart failure.  Nephrology was called due to kidney dysfunction. They recommended admission for further management. Patient admitted to hospitalist service for further management.    Final Clinical Impressions(s) / ED Diagnoses   Final diagnoses:  AKI (acute kidney injury) ([place])  Septic shock [facility])    New Prescriptions Current Discharge Medication List     Clinical Impression: 1. AKI (acute kidney injury) ([place])   2. Septic shock ([place])     Disposition: Admit to Hospitalist service    Courtney Paris, MD 04/14/16 1827

## 2016-04-13 NOTE — Consult Note (Signed)
[facility] Consult Note     Date: 04/13/2016                  Patient Name:  [name]  MRN: [id number]  DOB: 01/19/1922  Age / Sex: 83 y.o., male         PCP: Jenny Reichmann, MD                 Service Requesting Consult: ED / [place]                 Reason for Consult: Acute kidney injury            Chief Complaint: altered mental status  HPI: [name] is a 83 y.o. male with history of HTN, afib, BPH, combined CHF EF 25-30%, and a recent R subcapital hip fracture with pinning 1/ 2018 who is now seen in consultation at the request of Dr. Gustavus Messing of the ED for evaluation and recommendations surrounding AKI.    Pt has been receiving rehab at [facility] since his hip surgery; noted to be lethargic over the past 2 days and was sent for a CXR which showed a possible L infiltrate.  Upon arrival here CXR confirmed which showed pulmonary edema but no overt infiltrate.  Cr (baseline) 1.2 (03/28/16)--> 7.68 today.  BUN 144.  He is being treated for possible severe sepsis with broad spectrum antibiotics and fluids.  Hypoxic to the 70s, placed on NRB.  He is confirmed DNR/DNI.  Past Medical History:  Diagnosis Date  . Aspiration pneumonia ([place])   . Atrial fibrillation with RVR ([place])   . BARRETTS ESOPHAGUS 11/19/2007  . Blood transfusion without reported diagnosis   . Cardiomyopathy ([place])   . CHANGE IN BOWELS 02/01/2009  . Chronic combined systolic (congestive) and diastolic (congestive) heart failure    a. echo 2015: EF 45% b. Echo 10/2015: EF 25-30% with diffuse HK  . CKD stage G2/A2, GFR 60 - 89 and albumin creatinine ratio 30 - 299 mg/g   . Compression fracture of L2 lumbar vertebra (HCC)   . Diarrhea 01/31/2009  . DIVERTICULOSIS, COLON 11/19/2007  . 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 GASTRITIS 11/19/2007  . GERD 11/19/2007  . HIATAL HERNIA 11/19/2007  . HOH (hard of hearing)   . HTN (hypertension)   . HYPERLIPIDEMIA 06/27/2009  . Hypokalemia   . Hypomagnesemia  10/2015  . HYPOTHYROIDISM 06/27/2009  . IBS (irritable bowel syndrome)   . Intussusception ([place])   . MELENA 08/01/2009  . Nonischemic cardiomyopathy ([place])    a. NST in 2010 with no evidence of ischemia.  Marland Kitchen PERSONAL HX COLONIC POLYPS 02/01/2009  . Protein-calorie malnutrition, severe ([place])   . TINEA CORPORIS 06/27/2009  . TOBACCO USE, QUIT 06/27/2009  . TREMOR, ESSENTIAL 06/27/2009    Past Surgical History:  Procedure Laterality Date  . BEZOAR EVACUATION  11/09/2015   ILEUM  . CHOLECYSTECTOMY    . ESOPHAGOGASTRODUODENOSCOPY N/A 04/28/2013   Procedure: ESOPHAGOGASTRODUODENOSCOPY (EGD);  Surgeon: Jerene Bears, MD;  Location: [facility];  Service: Gastroenterology;  Laterality: N/A;  . HERNIA REPAIR    . HIP ARTHROPLASTY Left 11/30/2013   Procedure: ARTHROPLASTY BIPOLAR HIP;  Surgeon: [name], MD;  Location: [facility];  Service: Orthopedics;  Laterality: Left;  . HIP PINNING,CANNULATED Right 03/29/2016   Procedure: CANNULATED HIP PINNING;  Surgeon: Nicholes Stairs, MD;  Location: [facility];  Service: Orthopedics;  Laterality: Right;  . LAPAROTOMY N/A 11/09/2015   Procedure: EXPLORATORY  LAPAROTOMY;  Surgeon: [name], MD;  Location: [facility];  Service: General;  Laterality: N/A;  . LUNG SURGERY  1984  . TONSILLECTOMY    . UMBILICAL HERNIA REPAIR    . VASECTOMY      Family History  Problem Relation Age of Onset  . Heart disease Mother   . Colon cancer Father   . Prostate cancer Brother    Social History:  reports that he quit smoking about 73 years ago. His smoking use included Cigarettes. He has a 72.00 pack-year smoking history. He has never used smokeless tobacco. He reports that he drinks about 0.6 oz of alcohol per week . He reports that he does not use drugs.  Allergies:  Allergies  Allergen Reactions  . Ciprofloxacin Other (See Comments)    Reaction:  Unknown   . Penicillins Rash and Other (See Comments)    Tolerated Unasyn 11/03/15 Has patient had a PCN reaction causing  immediate rash, facial/tongue/throat swelling, SOB or lightheadedness with hypotension: No Has patient had a PCN reaction causing severe rash involving mucus membranes or skin necrosis: No Has patient had a PCN reaction that required hospitalization No Has patient had a PCN reaction occurring within the last 10 years: No If all of the above answers are "NO", then may proceed with Cephalosporin use.     (Not in a hospital admission)  Results for orders placed or performed during the hospital encounter of 04/13/16 (from the past 48 hour(s))  Comprehensive metabolic panel     Status: Abnormal   Collection Time: 04/13/16  2:11 PM  Result Value Ref Range   Sodium 137 135 - 145 mmol/L   Potassium 4.4 3.5 - 5.1 mmol/L   Chloride 105 101 - 111 mmol/L   CO2 16 (L) 22 - 32 mmol/L   Glucose, Bld 132 (H) 65 - 99 mg/dL   BUN 144 (H) 6 - 20 mg/dL   Creatinine, Ser 7.68 (H) 0.61 - 1.24 mg/dL   Calcium 8.7 (L) 8.9 - 10.3 mg/dL   Total Protein 6.5 6.5 - 8.1 g/dL   Albumin 2.9 (L) 3.5 - 5.0 g/dL   AST 15 15 - 41 U/L   ALT 10 (L) 17 - 63 U/L   Alkaline Phosphatase 109 38 - 126 U/L   Total Bilirubin 0.8 0.3 - 1.2 mg/dL   GFR calc non Af Amer 5 (L) >60 mL/min   GFR calc Af Amer 6 (L) >60 mL/min    Comment: (NOTE) The eGFR has been calculated using the CKD EPI equation. This calculation has not been validated in all clinical situations. eGFR's persistently <60 mL/min signify possible Chronic Kidney Disease.    Anion gap 16 (H) 5 - 15  CBC WITH DIFFERENTIAL     Status: Abnormal   Collection Time: 04/13/16  2:11 PM  Result Value Ref Range   WBC 9.0 4.0 - 10.5 K/uL   RBC 4.28 4.22 - 5.81 MIL/uL   Hemoglobin 12.6 (L) 13.0 - 17.0 g/dL   HCT 36.6 (L) 39.0 - 52.0 %   MCV 85.5 78.0 - 100.0 fL   MCH 29.4 26.0 - 34.0 pg   MCHC 34.4 30.0 - 36.0 g/dL   RDW 13.5 11.5 - 15.5 %   Platelets 415 (H) 150 - 400 K/uL   Neutrophils Relative % 76 %   Neutro Abs 6.9 1.7 - 7.7 K/uL   Lymphocytes Relative 14 %    Lymphs Abs 1.2 0.7 - 4.0 K/uL   Monocytes Relative 10 %  Monocytes Absolute 0.9 0.1 - 1.0 K/uL   Eosinophils Relative 0 %   Eosinophils Absolute 0.0 0.0 - 0.7 K/uL   Basophils Relative 0 %   Basophils Absolute 0.0 0.0 - 0.1 K/uL  Protime-INR     Status: Abnormal   Collection Time: 04/13/16  2:11 PM  Result Value Ref Range   Prothrombin Time 18.7 (H) 11.4 - 15.2 seconds   INR 1.55   I-Stat CG4 Lactic Acid, ED  (not at  [facility])     Status: None   Collection Time: 04/13/16  2:37 PM  Result Value Ref Range   Lactic Acid, Venous 1.84 0.5 - 1.9 mmol/L   Dg Chest Port 1 View  Result Date: 04/13/2016 CLINICAL DATA:  Cough and hypoxia. EXAM: PORTABLE CHEST 1 VIEW COMPARISON:  03/27/2016 FINDINGS: Low volume film. The cardio pericardial silhouette is enlarged. Pulmonary vasculature is congested with diffuse interstitial opacities suggesting edema. The visualized bony structures of the thorax are intact. Telemetry leads overlie the chest. IMPRESSION: Cardiomegaly with pulmonary edema. Calcified pleural plaques suggest prior asbestos exposure. Electronically Signed   By: Misty Stanley M.D.   On: 04/13/2016 14:26    ROS: unable to assess due to mental status  Blood pressure 103/55, pulse 87, temperature 100.9 F (38.3 C), temperature source Rectal, resp. rate 22, height '5\' 5"'$  (1.651 m), weight 61.2 kg (135 lb), SpO2 91 %. Physical Exam  GEN: confused, on NRB HEENT EOMI, PERRL NECK; thin, no overt JVD PULM increased WOB, diffuse crackles and wheezes throughout CV tachycardic ABD soft, thin, do not feel bladder enlargement EXT no LE edema NEURO able to answer simple questions but nothing really complex.  Nonfocal   Assessment/Plan  1.  Acute kidney injury superimposed on CKD G2A2: I suspect ATN leading to decreased urine output and ultimately vol overload.  Upon review of MAR at [facility], was receiving HCTZ/ lisinopril and also Lasix.  Need to attempt diuresis but will be difficult  given soft pressures.  I have talked with pt's son (greg, HCPOA) and he does not think that his father would want dialysis.  Therefore, plan as follows: - Foley for strict I and O and possible relief of obstruction - Lasix 40 IV BID - no dialysis  2.  Acute systolic CHF exacerbation/ pulm edema: - lasix as above - no ACEi  3.  AMS: uremia vs ? sepsis  4.  Sepsis: cultures, broad spectrum abx, PNA not visualized on CXR here at [place], MD [facility] 04/13/2016, 5:21 PM

## 2016-04-13 NOTE — ED Triage Notes (Signed)
Pt to ED from [facility] via [place] with staff c/o pt had pneumonia, and altered level of consciousness Pt O2 sat on room air 73-- on nonrebreather = 93%

## 2016-04-14 ENCOUNTER — Inpatient Hospital Stay (HOSPITAL_COMMUNITY): Payer: Medicare Other

## 2016-04-14 DIAGNOSIS — I5023 Acute on chronic systolic (congestive) heart failure: Secondary | ICD-10-CM

## 2016-04-14 DIAGNOSIS — J810 Acute pulmonary edema: Secondary | ICD-10-CM

## 2016-04-14 DIAGNOSIS — N183 Chronic kidney disease, stage 3 (moderate): Secondary | ICD-10-CM

## 2016-04-14 LAB — BASIC METABOLIC PANEL
ANION GAP: 15 (ref 5–15)
BUN: 134 mg/dL — ABNORMAL HIGH (ref 6–20)
CO2: 14 mmol/L — AB (ref 22–32)
Calcium: 8.8 mg/dL — ABNORMAL LOW (ref 8.9–10.3)
Chloride: 111 mmol/L (ref 101–111)
Creatinine, Ser: 6.35 mg/dL — ABNORMAL HIGH (ref 0.61–1.24)
GFR calc non Af Amer: 7 mL/min — ABNORMAL LOW (ref >60)
GFR, EST AFRICAN AMERICAN: 8 mL/min — AB (ref >60)
GLUCOSE: 97 mg/dL (ref 65–99)
POTASSIUM: 4.7 mmol/L (ref 3.5–5.1)
Sodium: 140 mmol/L (ref 135–145)

## 2016-04-14 LAB — CBC
HCT: 35.3 % — ABNORMAL LOW (ref 39.0–52.0)
Hemoglobin: 11.9 g/dL — ABNORMAL LOW (ref 13.0–17.0)
MCH: 29.1 pg (ref 26.0–34.0)
MCHC: 33.7 g/dL (ref 30.0–36.0)
MCV: 86.3 fL (ref 78.0–100.0)
PLATELETS: 374 10*3/uL (ref 150–400)
RBC: 4.09 MIL/uL — ABNORMAL LOW (ref 4.22–5.81)
RDW: 13.7 % (ref 11.5–15.5)
WBC: 9 10*3/uL (ref 4.0–10.5)

## 2016-04-14 LAB — RESPIRATORY PANEL BY PCR
ADENOVIRUS-RVPPCR: NOT DETECTED
Bordetella pertussis: NOT DETECTED
CHLAMYDOPHILA PNEUMONIAE-RVPPCR: NOT DETECTED
CORONAVIRUS 229E-RVPPCR: NOT DETECTED
CORONAVIRUS HKU1-RVPPCR: NOT DETECTED
CORONAVIRUS NL63-RVPPCR: NOT DETECTED
CORONAVIRUS OC43-RVPPCR: NOT DETECTED
Influenza A: NOT DETECTED
Influenza B: NOT DETECTED
Metapneumovirus: NOT DETECTED
Mycoplasma pneumoniae: NOT DETECTED
PARAINFLUENZA VIRUS 1-RVPPCR: NOT DETECTED
Parainfluenza Virus 2: NOT DETECTED
Parainfluenza Virus 3: NOT DETECTED
Parainfluenza Virus 4: NOT DETECTED
Respiratory Syncytial Virus: DETECTED — AB
Rhinovirus / Enterovirus: NOT DETECTED

## 2016-04-14 MED ORDER — OSELTAMIVIR PHOSPHATE 30 MG PO CAPS
30.0000 mg | ORAL_CAPSULE | ORAL | Status: DC
  Administered 2016-04-14: 30 mg via ORAL
  Filled 2016-04-14: qty 1

## 2016-04-14 MED ORDER — SODIUM CHLORIDE 0.9 % IV BOLUS (SEPSIS)
1000.0000 mL | Freq: Once | INTRAVENOUS | Status: AC
  Administered 2016-04-14: 1000 mL via INTRAVENOUS

## 2016-04-14 MED ORDER — SODIUM CHLORIDE 0.9 % IV BOLUS (SEPSIS)
500.0000 mL | Freq: Once | INTRAVENOUS | Status: AC
  Administered 2016-04-14: 500 mL via INTRAVENOUS

## 2016-04-14 MED ORDER — OSELTAMIVIR PHOSPHATE 30 MG PO CAPS: 30.0000 mg | ORAL_CAPSULE | Freq: Every day | ORAL | Status: DC

## 2016-04-14 NOTE — Progress Notes (Signed)
[name] Progress Note    Assessment/ Plan:   1.  Acute kidney injury superimposed on CKD G2A2: I suspect ATN leading to decreased urine output and ultimately vol overload.  Upon review of MAR at [facility] place, was receiving HCTZ/ lisinopril and also Lasix.  Need to attempt diuresis but will be difficult given soft pressures.  I have talked with pt's son (greg, HCPOA) and he does not think that his father would want dialysis.  Therefore, plan as follows: - Foley for strict I and O and possible relief of obstruction - d/c Lasix  - no dialysis  2.  Acute systolic CHF exacerbation/ pulm edema: - lasix as above - no ACEi  3.  AMS: uremia vs ? Sepsis  4.  Sepsis: cultures, broad spectrum abx, PNA not visualized on CXR here at [facility] but likely still the case, s/p fluid resuscitation  5.  Afib: Eliquis resumed, would use caution with AKI   Subjective:    Hypotensive overnight, off NRB, good urine output, cr coming down   Objective:   BP (!) 97/51   Pulse 68   Temp 97.4 F (36.3 C) (Oral)   Resp (!) 22   Ht 5\' 5"  (1.651 m)   Wt 61.2 kg (135 lb)   SpO2 99%   BMI 22.47 kg/m   Intake/Output Summary (Last 24 hours) at 04/14/16 1048 Last data filed at 04/14/16 0650  Gross per 24 hour  Intake             1050 ml  Output             2075 ml  Net            -1025 ml   Weight change:   Physical Exam: GEN: sleeping, on RA HEENT EOMI, PERRL NECK; thin, no overt JVD PULM WOB improved, some scattered rhonchi but much improved from yesterday CV tachycardic ABD soft, thin, no masses EXT no LE edema NEURO sleeping, arousable  Imaging: Dg Chest Port 1 View  Result Date: 04/13/2016 CLINICAL DATA:  Cough and hypoxia. EXAM: PORTABLE CHEST 1 VIEW COMPARISON:  03/27/2016 FINDINGS: Low volume film. The cardio pericardial silhouette is enlarged. Pulmonary vasculature is congested with diffuse interstitial opacities suggesting edema. The visualized bony structures of the  thorax are intact. Telemetry leads overlie the chest. IMPRESSION: Cardiomegaly with pulmonary edema. Calcified pleural plaques suggest prior asbestos exposure. Electronically Signed   By: Misty Stanley M.D.   On: 04/13/2016 14:26    Labs: BMET  Recent Labs Lab 04/13/16 1411 04/14/16 0209  NA 137 140  K 4.4 4.7  CL 105 111  CO2 16* 14*  GLUCOSE 132* 97  BUN 144* 134*  CREATININE 7.68* 6.35*  CALCIUM 8.7* 8.8*   CBC  Recent Labs Lab 04/13/16 1411 04/14/16 0209  WBC 9.0 9.0  NEUTROABS 6.9  --   HGB 12.6* 11.9*  HCT 36.6* 35.3*  MCV 85.5 86.3  PLT 415* 374    Medications:    . apixaban  2.5 mg Oral BID  . ceFEPime (MAXIPIME) IV  500 mg Intravenous Q24H  . ipratropium-albuterol  3 mL Nebulization TID  . mouth rinse  15 mL Mouth Rinse BID  . metoprolol succinate  25 mg Oral Q breakfast  . pantoprazole  40 mg Oral Daily  . simvastatin  20 mg Oral QHS  . sodium chloride  500 mL Intravenous Once  . sodium chloride flush  3 mL Intravenous Q12H  . tamsulosin  0.4  mg Oral QPC supper      Madelon Lips MD 04/14/2016, 10:48 AM

## 2016-04-14 NOTE — Evaluation (Signed)
Clinical/Bedside Swallow Evaluation Patient Details  Name: [name] MRN: [id number] Date of Birth: [birth year]  Today's Date: 04/14/2016 Time: SLP Start Time (ACUTE ONLY): 4 SLP Stop Time (ACUTE ONLY): 1142 SLP Time Calculation (min) (ACUTE ONLY): 12 min  Past Medical History:  Past Medical History:  Diagnosis Date  . Aspiration pneumonia ([place])   . Atrial fibrillation with RVR ([place])   . BARRETTS ESOPHAGUS 11/19/2007  . Blood transfusion without reported diagnosis   . Cardiomyopathy ([place])   . CHANGE IN BOWELS 02/01/2009  . Chronic combined systolic (congestive) and diastolic (congestive) heart failure    a. echo 2015: EF 45% b. Echo 10/2015: EF 25-30% with diffuse HK  . CKD stage G2/A2, GFR 60 - 89 and albumin creatinine ratio 30 - 299 mg/g   . Compression fracture of L2 lumbar vertebra (HCC)   . Diarrhea 01/31/2009  . DIVERTICULOSIS, COLON 11/19/2007  . 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 GASTRITIS 11/19/2007  . GERD 11/19/2007  . HIATAL HERNIA 11/19/2007  . HOH (hard of hearing)   . HTN (hypertension)   . HYPERLIPIDEMIA 06/27/2009  . Hypokalemia   . Hypomagnesemia 10/2015  . HYPOTHYROIDISM 06/27/2009  . IBS (irritable bowel syndrome)   . Intussusception ([place])   . MELENA 08/01/2009  . Nonischemic cardiomyopathy ([place])    a. NST in 2010 with no evidence of ischemia.  Marland Kitchen PERSONAL HX COLONIC POLYPS 02/01/2009  . Protein-calorie malnutrition, severe ([place])   . TINEA CORPORIS 06/27/2009  . TOBACCO USE, QUIT 06/27/2009  . TREMOR, ESSENTIAL 06/27/2009   Past Surgical History:  Past Surgical History:  Procedure Laterality Date  . BEZOAR EVACUATION  11/09/2015   ILEUM  . CHOLECYSTECTOMY    . ESOPHAGOGASTRODUODENOSCOPY N/A 04/28/2013   Procedure: ESOPHAGOGASTRODUODENOSCOPY (EGD);  Surgeon: Sean Bears, MD;  Location: [facility];  Service: Gastroenterology;  Laterality: N/A;  . HERNIA REPAIR    . HIP ARTHROPLASTY Left 11/30/2013   Procedure: ARTHROPLASTY BIPOLAR HIP;  Surgeon: Sean Alf,  MD;  Location: [facility];  Service: Orthopedics;  Laterality: Left;  . HIP PINNING,CANNULATED Right 03/29/2016   Procedure: CANNULATED HIP PINNING;  Surgeon: Sean Stairs, MD;  Location: [facility];  Service: Orthopedics;  Laterality: Right;  . LAPAROTOMY N/A 11/09/2015   Procedure: EXPLORATORY LAPAROTOMY;  Surgeon: Sean Hausen, MD;  Location: [facility];  Service: General;  Laterality: N/A;  . LUNG SURGERY  1984  . TONSILLECTOMY    . UMBILICAL HERNIA REPAIR    . VASECTOMY     HPI:  Sean Marso Bowmanis a 83 y.o.malethe past medical history significant for A. fib, CK D, essential tremor, barretts esophagus, GERD, aspiration pneumonia, hypertension, IBS, intussusception presents to emergency room with chief complaint of hypoxia. Admitted with sepsis due to URI, acute respiratory failure with hypoxia, acute on chronic systolic heart failure, acute pulmonary edema. During in prior admission patient was evaluated at bedside by SLP, placed on nectar-thick liquids only 2/2 overt signs of aspiration with thin liquids and pureed. SLP recommended advancement to thin liquids/ dysphagia 3 prior to discharge.   Assessment / Plan / Recommendation Clinical Impression  Patient presents with overt signs of aspiration with trials of thin liquids and pureed. Noted immediate throat clearing x2 following trials of thin liquids via teaspoon and immediate cough x2 following cup sips. Patient presents with delayed cough x1 following trial of pureed via teaspoon. Noted belching and hiccups following PO trials. RN reported patient's respiratory status had improved and pt with steady O2 sats on room air,  however with PO trials, noted increased respiratory rate and drop in 02 sats. Based on overt signs of aspiration at bedside, as well as patient's history of barretts esophagus, GERD, decreased respiratory status, and history of pneumonia recommend continuing NPO status pending objective evaluation MBS. Will f/u with additional  recommendations following MBS this afternoon.    Aspiration Risk  Moderate aspiration risk    Diet Recommendation NPO (pending mbs)   Medication Administration: Via alternative means    Other  Recommendations Oral Care Recommendations: Oral care QID   Follow up Recommendations        Frequency and Duration            Prognosis Prognosis for Safe Diet Advancement: Good      Swallow Study   General Date of Onset: 04/13/16 HPI: Sean Fukumoto Bowmanis a 83 y.o.malethe past medical history significant for A. fib, CK D, essential tremor, barretts esophagus, GERD, aspiration pneumonia, hypertension, IBS, intussusception presents to emergency room with chief complaint of hypoxia. Admitted with sepsis due to URI, acute respiratory failure with hypoxia, acute on chronic systolic heart failure, acute pulmonary edema. During in prior admission patient was evaluated at bedside by SLP, placed on nectar-thick liquids only 2/2 overt signs of aspiration with thin liquids and pureed. SLP recommended advancement to thin liquids/ dysphagia 3 prior to discharge. Type of Study: Bedside Swallow Evaluation Previous Swallow Assessment: See HPI Diet Prior to this Study: NPO Temperature Spikes Noted: No Respiratory Status: Room air History of Recent Intubation: No Behavior/Cognition: Alert;Cooperative;Requires cueing Oral Cavity Assessment: Within Functional Limits Oral Care Completed by SLP: Yes Oral Cavity - Dentition: Missing dentition Vision: Functional for self-feeding Self-Feeding Abilities: Needs assist Patient Positioning: Upright in bed Baseline Vocal Quality: Hoarse Volitional Cough: Congested;Weak Volitional Swallow: Able to elicit    Oral/Motor/Sensory Function Overall Oral Motor/Sensory Function: Within functional limits   Ice Chips Ice chips: Within functional limits   Thin Liquid Thin Liquid: Impaired Presentation: Cup;Spoon Pharyngeal  Phase Impairments: Suspected delayed  Swallow;Cough - Immediate;Throat Clearing - Immediate;Change in Vital Signs    Nectar Thick Nectar Thick Liquid: Not tested   Honey Thick Honey Thick Liquid: Not tested   Puree Puree: Impaired Pharyngeal Phase Impairments: Cough - Delayed   Solid   GO  Deneise Lever, MS CF-SLP Speech-Language Pathologist [phone] Solid: Not tested        Aliene Altes 04/14/2016,11:55 AM

## 2016-04-14 NOTE — Progress Notes (Signed)
RN notified on call [facility] NP about pt BP sustaining 80s/40s despite fluid resuscitation (of 3L)  in the Ed.  RN will follow new orders for 1L fluid bolus and will continue to monitor patient closely for any changes.

## 2016-04-14 NOTE — Progress Notes (Signed)
Patient's BP increased some with previous fluid bolus but continued to drop back to 80s/40s sustained.  RN notified [facility] NP on call.  NP ordered another 1L fluid bolus.  RN will follow orders and continue to monitor patient closely.
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
## 2016-04-14 NOTE — Progress Notes (Signed)
[facility] on call NP notified of BP remaining 80s/50s despite another NS bolus.  NP states MAP goal 60 or greater.  Patient's recent MAP was 64.  RN will continue to monitor patient.  No new orders given at this time.

## 2016-04-14 NOTE — Progress Notes (Signed)
Modified Barium Swallow Progress Note  Patient Details  Name: [name] MRN: [id number] Date of Birth: May 21, 1921  Today's Date: 04/14/2016  Modified Barium Swallow completed.  Full report located under Chart Review in the Imaging Section.  Brief recommendations include the following:  Clinical Impression  Patient presents with moderate oral and moderately-severe pharyngeal multifactorial dysphagia. Aspiration occurred with thin and nectar thick liquids and pureed solids as a result of severe residual remaining in the pharynx which was penetrated after the swallow and aspirated during subsequent swallows. Oral stage of swallowing noted for reduced lingual coordination, oral holding, lingual pumping and delayed anterior to posterior transit. Delayed swallow initiation after prolonged pooling of material in the valleculae. Reduced amplitude of UES opening noted resulting in poor bolus flow and incomplete clearance of material, which remained in the CP segment, was penetrated over the arytenoids and eventually aspirated. Patient with inconsistent response to penetration/aspiration; coughed x1 which initially cleared the airway, however material was once again penetrated/aspirated. Cued patient for multiple dry swallows and effortful swallows which did not significantly reduce residue. Patient intermittently follows commands. Recommend remaining NPO at this time as patient is severe aspiration risk due to current cognitive status, hx of pneumonia and esophageal issues, decreased respiratory status. May need temporary alternative means of nutrition/hydration. Patient would benefit from GI evaluation given history of GERD, Barrett's esophagus, and poor clearance of bolus through UES.    Swallow Evaluation Recommendations   Recommended Consults: Consider GI evaluation;Consider esophageal assessment   SLP Diet Recommendations: NPO       Medication Administration: Via alternative means           Deneise Lever, MS CF-SLP Speech-Language Pathologist [phone]       Oral Care Recommendations: Oral care QID        Aliene Altes 04/14/2016,2:22 PM

## 2016-04-15 DIAGNOSIS — Z66 Do not resuscitate: Secondary | ICD-10-CM

## 2016-04-15 DIAGNOSIS — R627 Adult failure to thrive: Secondary | ICD-10-CM | POA: Diagnosis present

## 2016-04-15 DIAGNOSIS — L8990 Pressure ulcer of unspecified site, unspecified stage: Secondary | ICD-10-CM | POA: Diagnosis present

## 2016-04-15 DIAGNOSIS — Z515 Encounter for palliative care: Secondary | ICD-10-CM

## 2016-04-15 LAB — URINE CULTURE: CULTURE: NO GROWTH

## 2016-04-15 LAB — BASIC METABOLIC PANEL
Anion gap: 13 (ref 5–15)
BUN: 115 mg/dL — AB (ref 6–20)
CALCIUM: 8.9 mg/dL (ref 8.9–10.3)
CO2: 17 mmol/L — AB (ref 22–32)
CREATININE: 4.45 mg/dL — AB (ref 0.61–1.24)
Chloride: 112 mmol/L — ABNORMAL HIGH (ref 101–111)
GFR calc Af Amer: 12 mL/min — ABNORMAL LOW (ref >60)
GFR calc non Af Amer: 10 mL/min — ABNORMAL LOW (ref >60)
GLUCOSE: 80 mg/dL (ref 65–99)
Potassium: 3.9 mmol/L (ref 3.5–5.1)
Sodium: 142 mmol/L (ref 135–145)

## 2016-04-15 LAB — VANCOMYCIN, TROUGH: Vancomycin Tr: 12 ug/mL — ABNORMAL LOW (ref 15–20)

## 2016-04-15 MED ORDER — SODIUM CHLORIDE 0.9 % IV BOLUS (SEPSIS): 500.0000 mL | Freq: Once | INTRAVENOUS | Status: DC

## 2016-04-15 MED ORDER — LORAZEPAM 1 MG PO TABS: 1.0000 mg | ORAL_TABLET | ORAL | Status: DC | PRN

## 2016-04-15 MED ORDER — VANCOMYCIN HCL IN DEXTROSE 750-5 MG/150ML-% IV SOLN
750.0000 mg | INTRAVENOUS | Status: DC
  Administered 2016-04-15: 750 mg via INTRAVENOUS
  Filled 2016-04-15: qty 150

## 2016-04-15 MED ORDER — MORPHINE SULFATE (CONCENTRATE) 10 MG/0.5ML PO SOLN: 5.0000 mg | ORAL | Status: DC | PRN
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
## 2016-04-15 NOTE — Progress Notes (Signed)
[facility] Progress Note    Assessment/ Plan:   1.  Acute kidney injury superimposed on CKD G2A2: I suspect ATN leading to decreased urine output and ultimately vol overload.  Upon review of MAR at [name] place, was receiving HCTZ/ lisinopril and also Lasix.  I have talked with pt's son ([name], HCPOA) and he does not think that his father would want dialysis.  creatinine improving with fluid resuscitation and excellent urine output.  No changes to plan today.  2.  Acute systolic CHF exacerbation/ pulm edema: No ACEi, s/p one dose IV Lasix in ED  3.  AMS: uremia vs ? Sepsis; seems to be somewhat improving today but not at baseline  4.  Sepsis: cultures, broad spectrum abx (vanc/ cefepime); respiratory viral panel + for RSV  5.  Afib: Eliquis resumed, would use caution with AKI   6.  Dispo: pall care consulted  Subjective:    Awake this AM ,  But does not respond to questions.     Objective:   BP (!) 90/52 (BP Location: Left Arm)   Pulse 76   Temp 97.4 F (36.3 C) (Oral)   Resp (!) 22   Ht 5\' 5"  (1.651 m)   Wt 61.2 kg (135 lb)   SpO2 96%   BMI 22.47 kg/m   Intake/Output Summary (Last 24 hours) at 04/15/16 0836 Last data filed at 04/15/16 0554  Gross per 24 hour  Intake              200 ml  Output             1850 ml  Net            -1650 ml   Weight change:   Physical Exam: GEN: awake, glasses on, does not respond to questions HEENT EOMI, PERRL, dry MM NECK; thin, no JVD PULM few scattered rhonchi CV RRR no m/r/g ABD soft, thin, no masses EXT no LE edema NEURO sleeping, arousable  Imaging: Dg Chest Port 1 View  Result Date: 04/13/2016 CLINICAL DATA:  Cough and hypoxia. EXAM: PORTABLE CHEST 1 VIEW COMPARISON:  03/27/2016 FINDINGS: Low volume film. The cardio pericardial silhouette is enlarged. Pulmonary vasculature is congested with diffuse interstitial opacities suggesting edema. The visualized bony structures of the thorax are intact. Telemetry  leads overlie the chest. IMPRESSION: Cardiomegaly with pulmonary edema. Calcified pleural plaques suggest prior asbestos exposure. Electronically Signed   By: Misty Stanley M.D.   On: 04/13/2016 14:26   Dg Swallowing Func-speech Pathology  Result Date: 04/14/2016 Objective Swallowing Evaluation: Type of Study: MBS-Modified Barium Swallow Study Patient Details Name: [name] MRN: [id number] Date of Birth: 10/07/1921 Today's Date: 04/14/2016 Time: SLP Start Time (ACUTE ONLY): 1230-SLP Stop Time (ACUTE ONLY): 1248 SLP Time Calculation (min) (ACUTE ONLY): 18 min Past Medical History: Past Medical History: Diagnosis Date . Aspiration pneumonia ([place])  . Atrial fibrillation with RVR ([place])  . BARRETTS ESOPHAGUS 11/19/2007 . Blood transfusion without reported diagnosis  . Cardiomyopathy ([place])  . CHANGE IN BOWELS 02/01/2009 . Chronic combined systolic (congestive) and diastolic (congestive) heart failure   a. echo 2015: EF 45% b. Echo 10/2015: EF 25-30% with diffuse HK . CKD stage G2/A2, GFR 60 - 89 and albumin creatinine ratio 30 - 299 mg/g  . Compression fracture of L2 lumbar vertebra ([facility])  . Diarrhea 01/31/2009 . DIVERTICULOSIS, COLON 11/19/2007 . Gastric bezoar 11/09/2015 . GASTRITIS 11/19/2007 . GERD 11/19/2007 . HIATAL HERNIA 11/19/2007 . HOH (hard of hearing)  .  HTN (hypertension)  . HYPERLIPIDEMIA 06/27/2009 . Hypokalemia  . Hypomagnesemia 10/2015 . HYPOTHYROIDISM 06/27/2009 . IBS (irritable bowel syndrome)  . Intussusception ([place])  . MELENA 08/01/2009 . Nonischemic cardiomyopathy ([place])   a. NST in 2010 with no evidence of ischemia. Marland Kitchen PERSONAL HX COLONIC POLYPS 02/01/2009 . Protein-calorie malnutrition, severe ([place])  . TINEA CORPORIS 06/27/2009 . TOBACCO USE, QUIT 06/27/2009 . TREMOR, ESSENTIAL 06/27/2009 Past Surgical History: Past Surgical History: Procedure Laterality Date . BEZOAR EVACUATION  11/09/2015  ILEUM . CHOLECYSTECTOMY   . ESOPHAGOGASTRODUODENOSCOPY N/A 04/28/2013  Procedure: ESOPHAGOGASTRODUODENOSCOPY (EGD);   Surgeon: Jerene Bears, MD;  Location: [facility];  Service: Gastroenterology;  Laterality: N/A; . HERNIA REPAIR   . HIP ARTHROPLASTY Left 11/30/2013  Procedure: ARTHROPLASTY BIPOLAR HIP;  Surgeon: [name], MD;  Location: [facility];  Service: Orthopedics;  Laterality: Left; . HIP PINNING,CANNULATED Right 03/29/2016  Procedure: CANNULATED HIP PINNING;  Surgeon: Nicholes Stairs, MD;  Location: [facility];  Service: Orthopedics;  Laterality: Right; . LAPAROTOMY N/A 11/09/2015  Procedure: EXPLORATORY LAPAROTOMY;  Surgeon: [name], MD;  Location: [facility];  Service: General;  Laterality: N/A; . LUNG SURGERY  1984 . TONSILLECTOMY   . UMBILICAL HERNIA REPAIR   . VASECTOMY   HPI: Sean Blumer Bowmanis a 83 y.o.malethe past medical history significant for A. fib, CK D, essential tremor, barretts esophagus, GERD, aspiration pneumonia, hypertension, IBS, intussusception presents to emergency room with chief complaint of hypoxia. Admitted with sepsis due to URI, acute respiratory failure with hypoxia, acute on chronic systolic heart failure, acute pulmonary edema. During in prior admission patient was evaluated at bedside by SLP, placed on nectar-thick liquids only 2/2 overt signs of aspiration with thin liquids and pureed. SLP recommended advancement to thin liquids/ dysphagia 3 prior to discharge. Subjective: Alert, oriented to self only Assessment / Plan / Recommendation CHL IP CLINICAL IMPRESSIONS 04/14/2016 Therapy Diagnosis Moderate oral phase dysphagia;Suspected primary esophageal dysphagia;Moderate pharyngeal phase dysphagia;Severe pharyngeal phase dysphagia Clinical Impression Patient presents with moderate oral and moderately-severe pharyngeal multifactorial dysphagia. Aspiration occurred with thin and nectar thick liquids and pureed solids as a result of severe residual remaining in the pharynx which was penetrated after the swallow and aspirated during subsequent swallows. Oral stage of swallowing noted for  reduced lingual coordination, oral holding, lingual pumping and delayed anterior to posterior transit. Delayed swallow initiation after prolonged pooling of material in the valleculae. Reduced amplitude of UES opening noted resulting in poor bolus flow and incomplete clearance of material, which remained in the CP segment, was penetrated over the arytenoids and eventually aspirated. Patient with inconsistent response to penetration/aspiration; coughed x1 which initially cleared the airway, however material was once again penetrated/aspirated. Cued patient for multiple dry swallows and effortful swallows which did not significantly reduce residue. Patient intermittently follows commands. Recommend remaining NPO at this time as patient is severe aspiration risk due to current cognitive status, hx of pneumonia and esophageal issues, decreased respiratory status. May need temporary alternative means of nutrition/hydration. Patient would benefit from GI evaluation given history of GERD, Barrett's esophagus, and poor clearance of bolus through UES.  Impact on safety and function Severe aspiration risk   CHL IP TREATMENT RECOMMENDATION 04/14/2016 Treatment Recommendations Therapy as outlined in treatment plan below   Prognosis 04/14/2016 Prognosis for Safe Diet Advancement Fair Barriers to Reach Goals Severity of deficits Barriers/Prognosis Comment -- CHL IP DIET RECOMMENDATION 04/14/2016 SLP Diet Recommendations NPO Liquid Administration via -- Medication Administration Via alternative means Compensations -- Postural Changes --  CHL IP OTHER RECOMMENDATIONS 04/14/2016 Recommended Consults Consider GI evaluation;Consider esophageal assessment Oral Care Recommendations Oral care QID Other Recommendations --   CHL IP FOLLOW UP RECOMMENDATIONS 04/14/2016 Follow up Recommendations Skilled Nursing facility   [facility] IP FREQUENCY AND DURATION 04/14/2016 Speech Therapy Frequency (ACUTE ONLY) min 2x/week Treatment Duration 2 weeks      CHL  IP ORAL PHASE 04/14/2016 Oral Phase Impaired Oral - Pudding Teaspoon -- Oral - Pudding Cup -- Oral - Honey Teaspoon -- Oral - Honey Cup -- Oral - Nectar Teaspoon Holding of bolus;Reduced posterior propulsion;Lingual pumping;Weak lingual manipulation;Delayed oral transit Oral - Nectar Cup -- Oral - Nectar Straw -- Oral - Thin Teaspoon Lingual pumping;Weak lingual manipulation;Holding of bolus;Delayed oral transit Oral - Thin Cup -- Oral - Thin Straw -- Oral - Puree Weak lingual manipulation;Holding of bolus;Lingual/palatal residue;Decreased bolus cohesion;Delayed oral transit Oral - Mech Soft -- Oral - Regular -- Oral - Multi-Consistency -- Oral - Pill -- Oral Phase - Comment --  CHL IP PHARYNGEAL PHASE 04/14/2016 Pharyngeal Phase Impaired Pharyngeal- Pudding Teaspoon -- Pharyngeal -- Pharyngeal- Pudding Cup -- Pharyngeal -- Pharyngeal- Honey Teaspoon -- Pharyngeal -- Pharyngeal- Honey Cup -- Pharyngeal -- Pharyngeal- Nectar Teaspoon Delayed swallow initiation-vallecula;Reduced tongue base retraction;Pharyngeal residue - cp segment;Pharyngeal residue - valleculae;Pharyngeal residue - pyriform;Penetration/Aspiration during swallow;Penetration/Apiration after swallow Pharyngeal Material enters airway, passes BELOW cords without attempt by patient to eject out (silent aspiration) Pharyngeal- Nectar Cup -- Pharyngeal -- Pharyngeal- Nectar Straw -- Pharyngeal -- Pharyngeal- Thin Teaspoon Delayed swallow initiation-vallecula;Penetration/Aspiration during swallow;Penetration/Apiration after swallow;Reduced tongue base retraction;Pharyngeal residue - pyriform;Pharyngeal residue - valleculae;Pharyngeal residue - cp segment Pharyngeal Material enters airway, passes BELOW cords without attempt by patient to eject out (silent aspiration);Material enters airway, passes BELOW cords then ejected out Pharyngeal- Thin Cup -- Pharyngeal -- Pharyngeal- Thin Straw -- Pharyngeal -- Pharyngeal- Puree Penetration/Apiration after  swallow;Delayed swallow initiation-vallecula;Pharyngeal residue - valleculae;Pharyngeal residue - cp segment;Pharyngeal residue - posterior pharnyx;Penetration/Aspiration during swallow;Reduced tongue base retraction Pharyngeal Material enters airway, passes BELOW cords without attempt by patient to eject out (silent aspiration) Pharyngeal- Mechanical Soft -- Pharyngeal -- Pharyngeal- Regular -- Pharyngeal -- Pharyngeal- Multi-consistency -- Pharyngeal -- Pharyngeal- Pill -- Pharyngeal -- Pharyngeal Comment --  CHL IP CERVICAL ESOPHAGEAL PHASE 04/14/2016 Cervical Esophageal Phase Impaired Pudding Teaspoon -- Pudding Cup -- Honey Teaspoon -- Honey Cup -- Nectar Teaspoon Reduced cricopharyngeal relaxation Nectar Cup -- Nectar Straw -- Thin Teaspoon Reduced cricopharyngeal relaxation Thin Cup -- Thin Straw -- Puree Reduced cricopharyngeal relaxation Mechanical Soft -- Regular -- Multi-consistency -- Pill -- Cervical Esophageal Comment -- Deneise Lever, MS CF-SLP Speech-Language Pathologist [phone] No flowsheet data found. Aliene Altes 04/14/2016, 2:21 PM               Labs: BMET  Recent Labs Lab 04/13/16 1411 04/14/16 0209 04/15/16 0239  NA 137 140 142  K 4.4 4.7 3.9  CL 105 111 112*  CO2 16* 14* 17*  GLUCOSE 132* 97 80  BUN 144* 134* 115*  CREATININE 7.68* 6.35* 4.45*  CALCIUM 8.7* 8.8* 8.9   CBC  Recent Labs Lab 04/13/16 1411 04/14/16 0209  WBC 9.0 9.0  NEUTROABS 6.9  --   HGB 12.6* 11.9*  HCT 36.6* 35.3*  MCV 85.5 86.3  PLT 415* 374    Medications:    . apixaban  2.5 mg Oral BID  . ceFEPime (MAXIPIME) IV  500 mg Intravenous Q24H  . mouth rinse  15 mL Mouth Rinse BID  . metoprolol succinate  25 mg  Oral Q breakfast  . pantoprazole  40 mg Oral Daily  . simvastatin  20 mg Oral QHS  . sodium chloride flush  3 mL Intravenous Q12H  . tamsulosin  0.4 mg Oral QPC supper  . vancomycin  750 mg Intravenous Q48H      Madelon Lips MD 04/15/2016, 8:36 AM

## 2016-04-15 NOTE — Progress Notes (Signed)
PROGRESS NOTE  [name] [id number] DOB: 1921/08/18 DOA: 04/13/2016 PCP: Jenny Reichmann, MD   LOS: 2 days   Brief Narrative: [name] is a 83 y.o. male  the past medical history significant for A. fib, CK D, reflux, hypertension, IBS, intussusception presents to emergency room with chief complaint of hypoxia. Patient states in skilled nursing facility. He was having respiratory issues was getting evaluated. Patient's son states that he could hear crackles from the patient. X-ray of the chest could not be done due to the snowstorm. Imaging recently done and patient immediately sent to the hospital for evaluation.  Assessment & Plan: Active Problems:   CKD (chronic kidney disease) stage 2, GFR 60-89 ml/min   Acute on chronic renal failure (HCC)   Acute respiratory failure (HCC)   Acute on chronic systolic heart failure (HCC)   Respiratory failure (HCC)   Acute pulmonary edema (HCC)   Pressure injury of skin   Sepsis due to URI / RSV infection / reported HCAP - febrile on admission, hypotensive and with end organ damage. Improving, sepsis physiology improving.  Respiratory failure with hypoxia - due to pulmonary edema in the setting of acute on chronic systolic CHF. He received Lasix x 2 on admission with significant improvement. Has been requiring back IV fluids due to hypotension  AKI on CKD 2-3 - patient with progressive renal failure likely in the setting of hypotension due to #1. Nephrology following, not a candidate for HD. Improving with hydration and conservative management.  Acute on chronic systolic CHF - most recent 2D echo in August 2017 with EF 25-35%. S/p Lasix 1/19 pm and fluids overnight for hypotension. Appears euvolemic this morning and is on room air. Closely monitor  GERD - Cont PPI  Hyperlipidemia - continue statin  Afib - Cont eliquis and BB (as BP allows)  BPH - Cont flomax  Goals of care - palliative was consulted, discussed with [name]  today, patient to be transitioned to comfort, we'll allow comfort feeding, see how he does over the next 24 hours and see if he can go back home or if she has no po intake or very high aspiration risk may need to be in [place] place.   DVT prophylaxis: Eliquis Code Status: DNR Family Communication: d/w son 1/20 Disposition Plan: transfer to [place]  Consultants:   Nephrology   Procedures:   None   Antimicrobials:  Vancomycin 1/19 >>  Cefepime 1/19 >>  Tamiflu 1/19 >> 1/20   Subjective: - no chest pain, shortness of breath, no abdominal pain, nausea or vomiting. Pleasantly confused  Objective: Vitals:   04/15/16 0800 04/15/16 1100 04/15/16 1135 04/15/16 1200  BP: (!) 93/50 (!) 91/54 (!) 91/54 (!) 96/57  Pulse:  84    Resp: (!) 24 20 (!) 26 (!) 23  Temp:  97.6 F (36.4 C)    TempSrc:  Oral    SpO2: 96% 97%    Weight:      Height:        Intake/Output Summary (Last 24 hours) at 04/15/16 1311 Last data filed at 04/15/16 1000  Gross per 24 hour  Intake              200 ml  Output             1850 ml  Net            -1650 ml   Filed Weights   04/13/16 1356 04/13/16 1517  Weight: 61.2 kg (  135 lb) 61.2 kg (135 lb)    Examination: Constitutional: NAD Vitals:   04/15/16 0800 04/15/16 1100 04/15/16 1135 04/15/16 1200  BP: (!) 93/50 (!) 91/54 (!) 91/54 (!) 96/57  Pulse:  84    Resp: (!) 24 20 (!) 26 (!) 23  Temp:  97.6 F (36.4 C)    TempSrc:  Oral    SpO2: 96% 97%    Weight:      Height:       Eyes: PERRL, lids and conjunctivae normal Respiratory: clear to auscultation bilaterally, no wheezing, no crackles.  Cardiovascular: Regular rate and rhythm, no murmurs / rubs / gallops. No LE edema. .  Abdomen: no tenderness. Bowel sounds positive.  Musculoskeletal: no clubbing / cyanosis. Decreased muscle tone Neurologic: non focal    Data Reviewed: I have personally reviewed following labs and imaging studies  CBC:  Recent Labs Lab 04/13/16 1411  04/14/16 0209  WBC 9.0 9.0  NEUTROABS 6.9  --   HGB 12.6* 11.9*  HCT 36.6* 35.3*  MCV 85.5 86.3  PLT 415* [id number]   Basic Metabolic Panel:  Recent Labs Lab 04/13/16 1411 04/14/16 0209 04/15/16 0239  NA 137 140 142  K 4.4 4.7 3.9  CL 105 111 112*  CO2 16* 14* 17*  GLUCOSE 132* 97 80  BUN 144* 134* 115*  CREATININE 7.68* 6.35* 4.45*  CALCIUM 8.7* 8.8* 8.9   GFR: Estimated Creatinine Clearance: 9.2 mL/min (by C-G formula based on SCr of 4.45 mg/dL (H)). Liver Function Tests:  Recent Labs Lab 04/13/16 1411  AST 15  ALT 10*  ALKPHOS 109  BILITOT 0.8  PROT 6.5  ALBUMIN 2.9*   No results for input(s): LIPASE, AMYLASE in the last 168 hours. No results for input(s): AMMONIA in the last 168 hours. Coagulation Profile:  Recent Labs Lab 04/13/16 1411  INR 1.55   Cardiac Enzymes: No results for input(s): CKTOTAL, CKMB, CKMBINDEX, TROPONINI in the last 168 hours. BNP (last 3 results) No results for input(s): PROBNP in the last 8760 hours. HbA1C: No results for input(s): HGBA1C in the last 72 hours. CBG: No results for input(s): GLUCAP in the last 168 hours. Lipid Profile: No results for input(s): CHOL, HDL, LDLCALC, TRIG, CHOLHDL, LDLDIRECT in the last 72 hours. Thyroid Function Tests: No results for input(s): TSH, T4TOTAL, FREET4, T3FREE, THYROIDAB in the last 72 hours. Anemia Panel: No results for input(s): VITAMINB12, FOLATE, FERRITIN, TIBC, IRON, RETICCTPCT in the last 72 hours. Urine analysis:    Component Value Date/Time   COLORURINE YELLOW 04/13/2016 2138   APPEARANCEUR CLEAR 04/13/2016 2138   LABSPEC 1.011 04/13/2016 2138   PHURINE 5.0 04/13/2016 2138   GLUCOSEU NEGATIVE 04/13/2016 2138   HGBUR NEGATIVE 04/13/2016 2138   BILIRUBINUR NEGATIVE 04/13/2016 2138   BILIRUBINUR neg 08/18/2013 0848   KETONESUR NEGATIVE 04/13/2016 2138   PROTEINUR NEGATIVE 04/13/2016 2138   UROBILINOGEN 0.2 11/30/2013 0804   NITRITE NEGATIVE 04/13/2016 2138   LEUKOCYTESUR  NEGATIVE 04/13/2016 2138   Sepsis Labs: Invalid input(s): PROCALCITONIN, LACTICIDVEN  Recent Results (from the past 240 hour(s))  Blood Culture (routine x 2)     Status: None (Preliminary result)   Collection Time: 04/13/16  2:11 PM  Result Value Ref Range Status   Specimen Description BLOOD LEFT ANTECUBITAL  Final   Special Requests IN PEDIATRIC BOTTLE 3CC  Final   Culture NO GROWTH < 24 HOURS  Final   Report Status PENDING  Incomplete  Respiratory Panel by PCR     Status: Abnormal  Collection Time: 04/13/16  5:00 PM  Result Value Ref Range Status   Adenovirus NOT DETECTED NOT DETECTED Final   Coronavirus 229E NOT DETECTED NOT DETECTED Final   Coronavirus HKU1 NOT DETECTED NOT DETECTED Final   Coronavirus NL63 NOT DETECTED NOT DETECTED Final   Coronavirus OC43 NOT DETECTED NOT DETECTED Final   Metapneumovirus NOT DETECTED NOT DETECTED Final   Rhinovirus / Enterovirus NOT DETECTED NOT DETECTED Final   Influenza A NOT DETECTED NOT DETECTED Final   Influenza B NOT DETECTED NOT DETECTED Final   Parainfluenza Virus 1 NOT DETECTED NOT DETECTED Final   Parainfluenza Virus 2 NOT DETECTED NOT DETECTED Final   Parainfluenza Virus 3 NOT DETECTED NOT DETECTED Final   Parainfluenza Virus 4 NOT DETECTED NOT DETECTED Final   Respiratory Syncytial Virus DETECTED (A) NOT DETECTED Final    Comment: CRITICAL RESULT CALLED TO, READ BACK BY AND VERIFIED WITH: K [name],RN AT [id number] 04/14/16 BY L BENFIELD    Bordetella pertussis NOT DETECTED NOT DETECTED Final   Chlamydophila pneumoniae NOT DETECTED NOT DETECTED Final   Mycoplasma pneumoniae NOT DETECTED NOT DETECTED Final  Blood Culture (routine x 2)     Status: None (Preliminary result)   Collection Time: 04/13/16  7:14 PM  Result Value Ref Range Status   Specimen Description BLOOD LEFT HAND  Final   Special Requests IN PEDIATRIC BOTTLE 1.5 CC  Final   Culture PENDING  Incomplete   Report Status PENDING  Incomplete  Urine culture     Status:  None   Collection Time: 04/13/16  9:38 PM  Result Value Ref Range Status   Specimen Description URINE, RANDOM  Final   Special Requests NONE  Final   Culture NO GROWTH  Final   Report Status 04/15/2016 FINAL  Final      Radiology Studies: Dg Chest Port 1 View  Result Date: 04/13/2016 CLINICAL DATA:  Cough and hypoxia. EXAM: PORTABLE CHEST 1 VIEW COMPARISON:  03/27/2016 FINDINGS: Low volume film. The cardio pericardial silhouette is enlarged. Pulmonary vasculature is congested with diffuse interstitial opacities suggesting edema. The visualized bony structures of the thorax are intact. Telemetry leads overlie the chest. IMPRESSION: Cardiomegaly with pulmonary edema. Calcified pleural plaques suggest prior asbestos exposure. Electronically Signed   By: Misty Stanley M.D.   On: 04/13/2016 14:26   Dg Swallowing Func-speech Pathology  Result Date: 04/14/2016 Objective Swallowing Evaluation: Type of Study: MBS-Modified Barium Swallow Study Patient Details Name: KHYRON HOLLENBERG MRN: [id number] Date of Birth: [birth year] Today's Date: 04/14/2016 Time: SLP Start Time (ACUTE ONLY): 1230-SLP Stop Time (ACUTE ONLY): 1248 SLP Time Calculation (min) (ACUTE ONLY): 18 min Past Medical History: Past Medical History: Diagnosis Date . Aspiration pneumonia ([place])  . Atrial fibrillation with RVR ([place])  . BARRETTS ESOPHAGUS 11/19/2007 . Blood transfusion without reported diagnosis  . Cardiomyopathy ([place])  . CHANGE IN BOWELS 02/01/2009 . Chronic combined systolic (congestive) and diastolic (congestive) heart failure   a. echo 2015: EF 45% b. Echo 10/2015: EF 25-30% with diffuse HK . CKD stage G2/A2, GFR 60 - 89 and albumin creatinine ratio 30 - 299 mg/g  . Compression fracture of L2 lumbar vertebra (HCC)  . Diarrhea 01/31/2009 . DIVERTICULOSIS, COLON 11/19/2007 . Gastric bezoar 11/09/2015 . GASTRITIS 11/19/2007 . GERD 11/19/2007 . HIATAL HERNIA 11/19/2007 . HOH (hard of hearing)  . HTN (hypertension)  . HYPERLIPIDEMIA 06/27/2009 .  Hypokalemia  . Hypomagnesemia 10/2015 . HYPOTHYROIDISM 06/27/2009 . IBS (irritable bowel syndrome)  . Intussusception ([place])  . MELENA 08/01/2009 .  Nonischemic cardiomyopathy ([place])   a. NST in 2010 with no evidence of ischemia. Marland Kitchen PERSONAL HX COLONIC POLYPS 02/01/2009 . Protein-calorie malnutrition, severe ([place])  . TINEA CORPORIS 06/27/2009 . TOBACCO USE, QUIT 06/27/2009 . TREMOR, ESSENTIAL 06/27/2009 Past Surgical History: Past Surgical History: Procedure Laterality Date . BEZOAR EVACUATION  11/09/2015  ILEUM . CHOLECYSTECTOMY   . ESOPHAGOGASTRODUODENOSCOPY N/A 04/28/2013  Procedure: ESOPHAGOGASTRODUODENOSCOPY (EGD);  Surgeon: Jerene Bears, MD;  Location: [facility];  Service: Gastroenterology;  Laterality: N/A; . HERNIA REPAIR   . HIP ARTHROPLASTY Left 11/30/2013  Procedure: ARTHROPLASTY BIPOLAR HIP;  Surgeon: [name], MD;  Location: [facility];  Service: Orthopedics;  Laterality: Left; . HIP PINNING,CANNULATED Right 03/29/2016  Procedure: CANNULATED HIP PINNING;  Surgeon: Nicholes Stairs, MD;  Location: [facility];  Service: Orthopedics;  Laterality: Right; . LAPAROTOMY N/A 11/09/2015  Procedure: EXPLORATORY LAPAROTOMY;  Surgeon: [name], MD;  Location: [facility];  Service: General;  Laterality: N/A; . LUNG SURGERY  1984 . TONSILLECTOMY   . UMBILICAL HERNIA REPAIR   . VASECTOMY   HPI: Phineas Curtis Bowmanis a 83 y.o.malethe past medical history significant for A. fib, CK D, essential tremor, barretts esophagus, GERD, aspiration pneumonia, hypertension, IBS, intussusception presents to emergency room with chief complaint of hypoxia. Admitted with sepsis due to URI, acute respiratory failure with hypoxia, acute on chronic systolic heart failure, acute pulmonary edema. During in prior admission patient was evaluated at bedside by SLP, placed on nectar-thick liquids only 2/2 overt signs of aspiration with thin liquids and pureed. SLP recommended advancement to thin liquids/ dysphagia 3 prior to discharge. Subjective: Alert,  oriented to self only Assessment / Plan / Recommendation CHL IP CLINICAL IMPRESSIONS 04/14/2016 Therapy Diagnosis Moderate oral phase dysphagia;Suspected primary esophageal dysphagia;Moderate pharyngeal phase dysphagia;Severe pharyngeal phase dysphagia Clinical Impression Patient presents with moderate oral and moderately-severe pharyngeal multifactorial dysphagia. Aspiration occurred with thin and nectar thick liquids and pureed solids as a result of severe residual remaining in the pharynx which was penetrated after the swallow and aspirated during subsequent swallows. Oral stage of swallowing noted for reduced lingual coordination, oral holding, lingual pumping and delayed anterior to posterior transit. Delayed swallow initiation after prolonged pooling of material in the valleculae. Reduced amplitude of UES opening noted resulting in poor bolus flow and incomplete clearance of material, which remained in the CP segment, was penetrated over the arytenoids and eventually aspirated. Patient with inconsistent response to penetration/aspiration; coughed x1 which initially cleared the airway, however material was once again penetrated/aspirated. Cued patient for multiple dry swallows and effortful swallows which did not significantly reduce residue. Patient intermittently follows commands. Recommend remaining NPO at this time as patient is severe aspiration risk due to current cognitive status, hx of pneumonia and esophageal issues, decreased respiratory status. May need temporary alternative means of nutrition/hydration. Patient would benefit from GI evaluation given history of GERD, Barrett's esophagus, and poor clearance of bolus through UES.  Impact on safety and function Severe aspiration risk   CHL IP TREATMENT RECOMMENDATION 04/14/2016 Treatment Recommendations Therapy as outlined in treatment plan below   Prognosis 04/14/2016 Prognosis for Safe Diet Advancement Fair Barriers to Reach Goals Severity of deficits  Barriers/Prognosis Comment -- CHL IP DIET RECOMMENDATION 04/14/2016 SLP Diet Recommendations NPO Liquid Administration via -- Medication Administration Via alternative means Compensations -- Postural Changes --   CHL IP OTHER RECOMMENDATIONS 04/14/2016 Recommended Consults Consider GI evaluation;Consider esophageal assessment Oral Care Recommendations Oral care QID Other Recommendations --   CHL IP FOLLOW UP RECOMMENDATIONS 04/14/2016  Follow up Recommendations Skilled Nursing facility   [facility] IP FREQUENCY AND DURATION 04/14/2016 Speech Therapy Frequency (ACUTE ONLY) min 2x/week Treatment Duration 2 weeks      CHL IP ORAL PHASE 04/14/2016 Oral Phase Impaired Oral - Pudding Teaspoon -- Oral - Pudding Cup -- Oral - Honey Teaspoon -- Oral - Honey Cup -- Oral - Nectar Teaspoon Holding of bolus;Reduced posterior propulsion;Lingual pumping;Weak lingual manipulation;Delayed oral transit Oral - Nectar Cup -- Oral - Nectar Straw -- Oral - Thin Teaspoon Lingual pumping;Weak lingual manipulation;Holding of bolus;Delayed oral transit Oral - Thin Cup -- Oral - Thin Straw -- Oral - Puree Weak lingual manipulation;Holding of bolus;Lingual/palatal residue;Decreased bolus cohesion;Delayed oral transit Oral - Mech Soft -- Oral - Regular -- Oral - Multi-Consistency -- Oral - Pill -- Oral Phase - Comment --  CHL IP PHARYNGEAL PHASE 04/14/2016 Pharyngeal Phase Impaired Pharyngeal- Pudding Teaspoon -- Pharyngeal -- Pharyngeal- Pudding Cup -- Pharyngeal -- Pharyngeal- Honey Teaspoon -- Pharyngeal -- Pharyngeal- Honey Cup -- Pharyngeal -- Pharyngeal- Nectar Teaspoon Delayed swallow initiation-vallecula;Reduced tongue base retraction;Pharyngeal residue - cp segment;Pharyngeal residue - valleculae;Pharyngeal residue - pyriform;Penetration/Aspiration during swallow;Penetration/Apiration after swallow Pharyngeal Material enters airway, passes BELOW cords without attempt by patient to eject out (silent aspiration) Pharyngeal- Nectar Cup -- Pharyngeal --  Pharyngeal- Nectar Straw -- Pharyngeal -- Pharyngeal- Thin Teaspoon Delayed swallow initiation-vallecula;Penetration/Aspiration during swallow;Penetration/Apiration after swallow;Reduced tongue base retraction;Pharyngeal residue - pyriform;Pharyngeal residue - valleculae;Pharyngeal residue - cp segment Pharyngeal Material enters airway, passes BELOW cords without attempt by patient to eject out (silent aspiration);Material enters airway, passes BELOW cords then ejected out Pharyngeal- Thin Cup -- Pharyngeal -- Pharyngeal- Thin Straw -- Pharyngeal -- Pharyngeal- Puree Penetration/Apiration after swallow;Delayed swallow initiation-vallecula;Pharyngeal residue - valleculae;Pharyngeal residue - cp segment;Pharyngeal residue - posterior pharnyx;Penetration/Aspiration during swallow;Reduced tongue base retraction Pharyngeal Material enters airway, passes BELOW cords without attempt by patient to eject out (silent aspiration) Pharyngeal- Mechanical Soft -- Pharyngeal -- Pharyngeal- Regular -- Pharyngeal -- Pharyngeal- Multi-consistency -- Pharyngeal -- Pharyngeal- Pill -- Pharyngeal -- Pharyngeal Comment --  CHL IP CERVICAL ESOPHAGEAL PHASE 04/14/2016 Cervical Esophageal Phase Impaired Pudding Teaspoon -- Pudding Cup -- Honey Teaspoon -- Honey Cup -- Nectar Teaspoon Reduced cricopharyngeal relaxation Nectar Cup -- Nectar Straw -- Thin Teaspoon Reduced cricopharyngeal relaxation Thin Cup -- Thin Straw -- Puree Reduced cricopharyngeal relaxation Mechanical Soft -- Regular -- Multi-consistency -- Pill -- Cervical Esophageal Comment -- Deneise Lever, MS CF-SLP Speech-Language Pathologist [phone] No flowsheet data found. Aliene Altes 04/14/2016, 2:21 PM                Scheduled Meds: . mouth rinse  15 mL Mouth Rinse BID  . metoprolol succinate  25 mg Oral Q breakfast  . sodium chloride flush  3 mL Intravenous Q12H  . tamsulosin  0.4 mg Oral QPC supper   Continuous Infusions:  Marzetta Board, MD, PhD [facility] Pager [phone]  If 7PM-7AM, please contact night-coverage [email or web address] Password [email or web address] 04/15/2016, 1:11 PM

## 2016-04-15 NOTE — Progress Notes (Signed)
Attempted report 

## 2016-04-15 NOTE — Progress Notes (Signed)
Pharmacy Antibiotic Note  [name] is a 83 y.o. male admitted on 04/13/2016 with pneumonia.  Pharmacy has been consulted for vancomycin dosing. SCr decreasing.    Plan: Vancomycin 750 mg IV q48h Watch renal function to ensure SCr continues to improve   Height: 5\' 5"  (165.1 cm) Weight: 135 lb (61.2 kg) IBW/kg (Calculated) : 61.5  Temp (24hrs), Avg:97.7 F (36.5 C), Min:97.4 F (36.3 C), Max:98.2 F (36.8 C)   Recent Labs Lab 04/13/16 1411 04/13/16 1437 04/14/16 0209 04/15/16 0239  WBC 9.0  --  9.0  --   CREATININE 7.68*  --  6.35* 4.45*  LATICACIDVEN  --  1.84  --   --   VANCOTROUGH  --   --   --  12*    Estimated Creatinine Clearance: 9.2 mL/min (by C-G formula based on SCr of 4.45 mg/dL (H)).    Allergies  Allergen Reactions  . Ciprofloxacin Other (See Comments)    Reaction:  Unknown   . Penicillins Rash and Other (See Comments)    Tolerated Unasyn 11/03/15 Has patient had a PCN reaction causing immediate rash, facial/tongue/throat swelling, SOB or lightheadedness with hypotension: No Has patient had a PCN reaction causing severe rash involving mucus membranes or skin necrosis: No Has patient had a PCN reaction that required hospitalization No Has patient had a PCN reaction occurring within the last 10 years: No If all of the above answers are "NO", then may proceed with Cephalosporin use.    Antimicrobials this admission: Vanc 1/19>> Cefepime 1/19>>  Dose adjustments this admission: N/A  Microbiology results: Pending  Caryl Pina 04/15/2016 5:23 AM

## 2016-04-15 NOTE — Progress Notes (Signed)
Patient arrived from 4 N to room 2w36, patient vital signs obtained and suction set up in room, patient alert and oriented to self and bed alarm placed will monitor patient. Zamirah Denny, Bettina Gavia RN

## 2016-04-16 DIAGNOSIS — J9611 Chronic respiratory failure with hypoxia: Secondary | ICD-10-CM

## 2016-04-16 DIAGNOSIS — A419 Sepsis, unspecified organism: Principal | ICD-10-CM

## 2016-04-16 DIAGNOSIS — R6521 Severe sepsis with septic shock: Secondary | ICD-10-CM

## 2016-04-16 DIAGNOSIS — N179 Acute kidney failure, unspecified: Secondary | ICD-10-CM

## 2016-04-16 DIAGNOSIS — R627 Adult failure to thrive: Secondary | ICD-10-CM

## 2016-04-16 DIAGNOSIS — R1314 Dysphagia, pharyngoesophageal phase: Secondary | ICD-10-CM

## 2016-04-16 DIAGNOSIS — N184 Chronic kidney disease, stage 4 (severe): Secondary | ICD-10-CM

## 2016-04-16 NOTE — Progress Notes (Addendum)
Patient coughing after feeding attempts. Had a hard time swallowing to the point I had to  suction. Patient unable to tolerate any PO's.

## 2016-04-16 NOTE — Progress Notes (Signed)
PROGRESS NOTE                                                                                                                                                                                                             Patient Demographics:    [name], is a 83 y.o. male, DOB - 1921-12-20, RN:[id number]  Admit date - 04/13/2016   Admitting Physician Elwin Mocha, MD  Outpatient Primary MD for the patient is DAUB, Lina Sayre, MD  LOS - 3    Chief Complaint  Patient presents with  . Code Sepsis       Brief Narrative   83 y.o.malethe past medical history significant for A. fib, CK D, reflux, hypertension, IBS, intussusception was sent to the ED from skilled nursing facility for shortness of breath and hypoxia. Patient signed to be septic with healthcare associated pneumonia/URI with acute hypoxic respiratory failure. He also had acute on chronic any disease along with acute on chronic systolic CHF. He was admitted to stepdown unit and given significant decline palliative care was consulted. After discussing with family patient was made for comfort with plan for home versus residential hospice.    Subjective:   Patient not in distress. Has not tried comfort feeds.. Is asking for some Gin and tonic.   Assessment  & Plan :   Sepsis due to URI / RSV infection / reported HCAP Patient febrile and hypotensive and with end organ damage. Significant decline overall. Hypothermic and hypotensive this morning. Now made full comfort.  Acute Respiratory failure with hypoxia  - due to pulmonary edema in the setting of acute on chronic systolic CHF. He received Lasix x 2 on admission with significant improvement. Subsequently received IV fluids for dehydration.  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  Nephrology following, not a candidate for HD. Slow  improvement in renal function with hydration. No alcohol for comfort.  Acute on chronic systolic CHF  - most recent 2D echo in August 2017 with EF 25-35%. S/p Lasix 1/19 pm and fluids overnight for hypotension. Appears euvolemic on exam today. Off all medications with goal for comfort.  GERD -  Discontinue PPI  Hyperlipidemia  Discontinue statin  Afib -  Discontinued anticoagulation and beta blocker.  BPH -  Cont  flomax  Goals of care - palliative was consulted, patient transition to full comfort. His poor by mouth intake with frequent coughing on attempts. Referral made to residential hospice. Placed on morphine solution as needed for pain and shortness of breath. Ativan as needed for anxiety and sleep.    Code Status: DNR Family Communication:  and at bedside  Disposition Plan:  referral made to inpatient hospice. Possibly discharge tomorrow.  Consultants:   Nephrology   Palliative care  Procedures:   None   Antimicrobials:  Vancomycin 1/19 >> 1/21  Cefepime 1/19 >> 1/21  Tamiflu 1/19 >> 1/20   DVT Prophylaxis  : Full comfort  Lab Results  Component Value Date   PLT 374 04/14/2016    Antibiotics  :    Anti-infectives    Start     Dose/Rate Route Frequency Ordered Stop   04/15/16 0600  vancomycin (VANCOCIN) IVPB 750 mg/150 ml premix  Status:  Discontinued     750 mg 150 mL/hr over 60 Minutes Intravenous Every 48 hours 04/15/16 0526 04/15/16 1027   04/14/16 1700  ceFEPIme (MAXIPIME) 500 mg in dextrose 5 % 50 mL IVPB  Status:  Discontinued     500 mg 100 mL/hr over 30 Minutes Intravenous Every 24 hours 04/13/16 1511 04/15/16 1027   04/14/16 1000  oseltamivir (TAMIFLU) capsule 30 mg  Status:  Discontinued     30 mg Oral Daily 04/14/16 0705 04/14/16 0705   04/14/16 0800  oseltamivir (TAMIFLU) capsule 30 mg  Status:  Discontinued    Comments:  Tamiflu 30mg  q48h for CrCl <10 ml/min   30 mg Oral Every 48 hours 04/14/16 0705 04/14/16 1048    04/13/16 1400  ceFEPIme (MAXIPIME) 2 g in dextrose 5 % 50 mL IVPB     2 g 100 mL/hr over 30 Minutes Intravenous  Once 04/13/16 1359 04/13/16 1517   04/13/16 1400  vancomycin (VANCOCIN) IVPB 1000 mg/200 mL premix     1,000 mg 200 mL/hr over 60 Minutes Intravenous  Once 04/13/16 1359 04/13/16 1722        Objective:   Vitals:   04/15/16 1434 04/15/16 2209 04/15/16 2347 04/16/16 0603  BP:  (!) 127/114 (!) 89/46 (!) 95/51  Pulse:  (!) 192 77 70  Resp:  18 18 18   Temp:  (!) 95 F (35 C)  (!) 93.6 F (34.2 C)  TempSrc:  Axillary  Axillary  SpO2: 92% 94% 94% 90%  Weight:      Height:        Wt Readings from Last 3 Encounters:  04/13/16 61.2 kg (135 lb)  04/03/16 61.2 kg (135 lb)  04/02/16 56.1 kg (123 lb 9.6 oz)     Intake/Output Summary (Last 24 hours) at 04/16/16 1353 Last data filed at 04/15/16 1818  Gross per 24 hour  Intake              118 ml  Output             1000 ml  Net             -882 ml     Physical Exam  Gen: Elderly thin built male not in distress HEENT:  dry mucosa, supple neck Chest: clear b/l, no added sounds CVS: N S1&S2, no murmurs GI: soft, NT, ND,  Musculoskeletal: warm, no edema     Data Review:    CBC  Recent Labs Lab 04/13/16 1411 04/14/16 0209  WBC 9.0 9.0  HGB 12.6* 11.9*  HCT 36.6* 35.3*  PLT 415* 374  MCV 85.5 86.3  MCH 29.4 29.1  MCHC 34.4 33.7  RDW 13.5 13.7  LYMPHSABS 1.2  --   MONOABS 0.9  --   EOSABS 0.0  --   BASOSABS 0.0  --     Chemistries   Recent Labs Lab 04/13/16 1411 04/14/16 0209 04/15/16 0239  NA 137 140 142  K 4.4 4.7 3.9  CL 105 111 112*  CO2 16* 14* 17*  GLUCOSE 132* 97 80  BUN 144* 134* 115*  CREATININE 7.68* 6.35* 4.45*  CALCIUM 8.7* 8.8* 8.9  AST 15  --   --   ALT 10*  --   --   ALKPHOS 109  --   --   BILITOT 0.8  --   --    ------------------------------------------------------------------------------------------------------------------ No results for input(s): CHOL, HDL,  LDLCALC, TRIG, CHOLHDL, LDLDIRECT in the last 72 hours.  Lab Results  Component Value Date   HGBA1C 6.4 (H) 07/10/2013   ------------------------------------------------------------------------------------------------------------------ No results for input(s): TSH, T4TOTAL, T3FREE, THYROIDAB in the last 72 hours.  Invalid input(s): FREET3 ------------------------------------------------------------------------------------------------------------------ No results for input(s): VITAMINB12, FOLATE, FERRITIN, TIBC, IRON, RETICCTPCT in the last 72 hours.  Coagulation profile  Recent Labs Lab 04/13/16 1411  INR 1.55    No results for input(s): DDIMER in the last 72 hours.  Cardiac Enzymes No results for input(s): CKMB, TROPONINI, MYOGLOBIN in the last 168 hours.  Invalid input(s): CK ------------------------------------------------------------------------------------------------------------------    Component Value Date/Time   BNP 92.9 08/10/2013 0820    Inpatient Medications  Scheduled Meds: . mouth rinse  15 mL Mouth Rinse BID  . sodium chloride flush  3 mL Intravenous Q12H   Continuous Infusions: PRN Meds:.albuterol, LORazepam, morphine CONCENTRATE, ondansetron **OR** ondansetron (ZOFRAN) IV  Micro Results Recent Results (from the past 240 hour(s))  Blood Culture (routine x 2)     Status: None (Preliminary result)   Collection Time: 04/13/16  2:11 PM  Result Value Ref Range Status   Specimen Description BLOOD LEFT ANTECUBITAL  Final   Special Requests IN PEDIATRIC BOTTLE 3CC  Final   Culture NO GROWTH 2 DAYS  Final   Report Status PENDING  Incomplete  Respiratory Panel by PCR     Status: Abnormal   Collection Time: 04/13/16  5:00 PM  Result Value Ref Range Status   Adenovirus NOT DETECTED NOT DETECTED Final   Coronavirus 229E NOT DETECTED NOT DETECTED Final   Coronavirus HKU1 NOT DETECTED NOT DETECTED Final   Coronavirus NL63 NOT DETECTED NOT DETECTED Final    Coronavirus OC43 NOT DETECTED NOT DETECTED Final   Metapneumovirus NOT DETECTED NOT DETECTED Final   Rhinovirus / Enterovirus NOT DETECTED NOT DETECTED Final   Influenza A NOT DETECTED NOT DETECTED Final   Influenza B NOT DETECTED NOT DETECTED Final   Parainfluenza Virus 1 NOT DETECTED NOT DETECTED Final   Parainfluenza Virus 2 NOT DETECTED NOT DETECTED Final   Parainfluenza Virus 3 NOT DETECTED NOT DETECTED Final   Parainfluenza Virus 4 NOT DETECTED NOT DETECTED Final   Respiratory Syncytial Virus DETECTED (A) NOT DETECTED Final    Comment: CRITICAL RESULT CALLED TO, READ BACK BY AND VERIFIED WITH: K [name],RN AT [id number] 04/14/16 BY L BENFIELD    Bordetella pertussis NOT DETECTED NOT DETECTED Final   Chlamydophila pneumoniae NOT DETECTED NOT DETECTED Final   Mycoplasma pneumoniae NOT DETECTED NOT DETECTED Final  Blood Culture (routine x 2)     Status: None (Preliminary result)   Collection  Time: 04/13/16  7:14 PM  Result Value Ref Range Status   Specimen Description BLOOD LEFT HAND  Final   Special Requests IN PEDIATRIC BOTTLE 1.5 CC  Final   Culture NO GROWTH 2 DAYS  Final   Report Status PENDING  Incomplete  Urine culture     Status: None   Collection Time: 04/13/16  9:38 PM  Result Value Ref Range Status   Specimen Description URINE, RANDOM  Final   Special Requests NONE  Final   Culture NO GROWTH  Final   Report Status 04/15/2016 FINAL  Final    Radiology Reports Dg Chest 1 View  Result Date: 03/27/2016 CLINICAL DATA:  Chest pain after fall.  Preop. EXAM: CHEST 1 VIEW COMPARISON:  None. FINDINGS: Stable cardiomegaly with aortic atherosclerosis. Diffuse calcified pleural plaque with mild interstitial edema. No pneumonic consolidation or pneumothorax. Chronic degenerative change of both shoulders. IMPRESSION: Stable cardiomegaly with aortic atherosclerosis. Calcified pleural plaque consistent with prior asbestos exposure. Mild interstitial edema. No pneumonic consolidations.  Electronically Signed   By: Ashley Royalty M.D.   On: 03/27/2016 19:05   Ct Hip Right Wo Contrast  Result Date: 03/27/2016 CLINICAL DATA:  Right subcapital femoral neck fracture EXAM: CT OF THE RIGHT HIP WITHOUT CONTRAST TECHNIQUE: Multidetector CT imaging of the right hip was performed according to the standard protocol. Multiplanar CT image reconstructions were also generated. COMPARISON:  Same day radiographs from 03/27/2016 FINDINGS: Bones/Joint/Cartilage There is an impacted subcapital right femoral neck fracture along its superolateral aspect. Slight joint space narrowing the native right hip. No acetabular or pubic rami fracture. Ligaments Suboptimally assessed by CT. Muscles and Tendons Nonacute Soft tissues Small joint effusion. No subcutaneous contusion over the lateral aspect of the right hip. IMPRESSION: Superolateral subcapital femoral neck fracture with impaction of the femoral head. No acetabular fracture identified. Electronically Signed   By: Ashley Royalty M.D.   On: 03/27/2016 21:45   Dg Chest Port 1 View  Result Date: 04/13/2016 CLINICAL DATA:  Cough and hypoxia. EXAM: PORTABLE CHEST 1 VIEW COMPARISON:  03/27/2016 FINDINGS: Low volume film. The cardio pericardial silhouette is enlarged. Pulmonary vasculature is congested with diffuse interstitial opacities suggesting edema. The visualized bony structures of the thorax are intact. Telemetry leads overlie the chest. IMPRESSION: Cardiomegaly with pulmonary edema. Calcified pleural plaques suggest prior asbestos exposure. Electronically Signed   By: Misty Stanley M.D.   On: 04/13/2016 14:26   Dg Swallowing Func-speech Pathology  Result Date: 04/14/2016 Objective Swallowing Evaluation: Type of Study: MBS-Modified Barium Swallow Study Patient Details Name: [name] MRN: [id number] Date of Birth: May 30, 1921 Today's Date: 04/14/2016 Time: SLP Start Time (ACUTE ONLY): 1230-SLP Stop Time (ACUTE ONLY): 1248 SLP Time Calculation (min) (ACUTE ONLY):  18 min Past Medical History: Past Medical History: Diagnosis Date . Aspiration pneumonia ([place])  . Atrial fibrillation with RVR ([place])  . BARRETTS ESOPHAGUS 11/19/2007 . Blood transfusion without reported diagnosis  . Cardiomyopathy ([place])  . CHANGE IN BOWELS 02/01/2009 . Chronic combined systolic (congestive) and diastolic (congestive) heart failure   a. echo 2015: EF 45% b. Echo 10/2015: EF 25-30% with diffuse HK . CKD stage G2/A2, GFR 60 - 89 and albumin creatinine ratio 30 - 299 mg/g  . Compression fracture of L2 lumbar vertebra ([facility])  . Diarrhea 01/31/2009 . DIVERTICULOSIS, COLON 11/19/2007 . Gastric bezoar 11/09/2015 . GASTRITIS 11/19/2007 . GERD 11/19/2007 . HIATAL HERNIA 11/19/2007 . HOH (hard of hearing)  . HTN (hypertension)  . HYPERLIPIDEMIA 06/27/2009 . Hypokalemia  . Hypomagnesemia  10/2015 . HYPOTHYROIDISM 06/27/2009 . IBS (irritable bowel syndrome)  . Intussusception ([place])  . MELENA 08/01/2009 . Nonischemic cardiomyopathy ([place])   a. NST in 2010 with no evidence of ischemia. Marland Kitchen PERSONAL HX COLONIC POLYPS 02/01/2009 . Protein-calorie malnutrition, severe ([place])  . TINEA CORPORIS 06/27/2009 . TOBACCO USE, QUIT 06/27/2009 . TREMOR, ESSENTIAL 06/27/2009 Past Surgical History: Past Surgical History: Procedure Laterality Date . BEZOAR EVACUATION  11/09/2015  ILEUM . CHOLECYSTECTOMY   . ESOPHAGOGASTRODUODENOSCOPY N/A 04/28/2013  Procedure: ESOPHAGOGASTRODUODENOSCOPY (EGD);  Surgeon: Jerene Bears, MD;  Location: [facility];  Service: Gastroenterology;  Laterality: N/A; . HERNIA REPAIR   . HIP ARTHROPLASTY Left 11/30/2013  Procedure: ARTHROPLASTY BIPOLAR HIP;  Surgeon: [name], MD;  Location: [facility];  Service: Orthopedics;  Laterality: Left; . HIP PINNING,CANNULATED Right 03/29/2016  Procedure: CANNULATED HIP PINNING;  Surgeon: Nicholes Stairs, MD;  Location: [facility];  Service: Orthopedics;  Laterality: Right; . LAPAROTOMY N/A 11/09/2015  Procedure: EXPLORATORY LAPAROTOMY;  Surgeon: [name], MD;  Location: [facility];   Service: General;  Laterality: N/A; . LUNG SURGERY  1984 . TONSILLECTOMY   . UMBILICAL HERNIA REPAIR   . VASECTOMY   HPI: Baird Holmon Bowmanis a 83 y.o.malethe past medical history significant for A. fib, CK D, essential tremor, barretts esophagus, GERD, aspiration pneumonia, hypertension, IBS, intussusception presents to emergency room with chief complaint of hypoxia. Admitted with sepsis due to URI, acute respiratory failure with hypoxia, acute on chronic systolic heart failure, acute pulmonary edema. During in prior admission patient was evaluated at bedside by SLP, placed on nectar-thick liquids only 2/2 overt signs of aspiration with thin liquids and pureed. SLP recommended advancement to thin liquids/ dysphagia 3 prior to discharge. Subjective: Alert, oriented to self only Assessment / Plan / Recommendation CHL IP CLINICAL IMPRESSIONS 04/14/2016 Therapy Diagnosis Moderate oral phase dysphagia;Suspected primary esophageal dysphagia;Moderate pharyngeal phase dysphagia;Severe pharyngeal phase dysphagia Clinical Impression Patient presents with moderate oral and moderately-severe pharyngeal multifactorial dysphagia. Aspiration occurred with thin and nectar thick liquids and pureed solids as a result of severe residual remaining in the pharynx which was penetrated after the swallow and aspirated during subsequent swallows. Oral stage of swallowing noted for reduced lingual coordination, oral holding, lingual pumping and delayed anterior to posterior transit. Delayed swallow initiation after prolonged pooling of material in the valleculae. Reduced amplitude of UES opening noted resulting in poor bolus flow and incomplete clearance of material, which remained in the CP segment, was penetrated over the arytenoids and eventually aspirated. Patient with inconsistent response to penetration/aspiration; coughed x1 which initially cleared the airway, however material was once again penetrated/aspirated. Cued patient for  multiple dry swallows and effortful swallows which did not significantly reduce residue. Patient intermittently follows commands. Recommend remaining NPO at this time as patient is severe aspiration risk due to current cognitive status, hx of pneumonia and esophageal issues, decreased respiratory status. May need temporary alternative means of nutrition/hydration. Patient would benefit from GI evaluation given history of GERD, Barrett's esophagus, and poor clearance of bolus through UES.  Impact on safety and function Severe aspiration risk   CHL IP TREATMENT RECOMMENDATION 04/14/2016 Treatment Recommendations Therapy as outlined in treatment plan below   Prognosis 04/14/2016 Prognosis for Safe Diet Advancement Fair Barriers to Reach Goals Severity of deficits Barriers/Prognosis Comment -- CHL IP DIET RECOMMENDATION 04/14/2016 SLP Diet Recommendations NPO Liquid Administration via -- Medication Administration Via alternative means Compensations -- Postural Changes --   CHL IP OTHER RECOMMENDATIONS 04/14/2016 Recommended Consults Consider GI evaluation;Consider esophageal  assessment Oral Care Recommendations Oral care QID Other Recommendations --   CHL IP FOLLOW UP RECOMMENDATIONS 04/14/2016 Follow up Recommendations Skilled Nursing facility   [facility] IP FREQUENCY AND DURATION 04/14/2016 Speech Therapy Frequency (ACUTE ONLY) min 2x/week Treatment Duration 2 weeks      CHL IP ORAL PHASE 04/14/2016 Oral Phase Impaired Oral - Pudding Teaspoon -- Oral - Pudding Cup -- Oral - Honey Teaspoon -- Oral - Honey Cup -- Oral - Nectar Teaspoon Holding of bolus;Reduced posterior propulsion;Lingual pumping;Weak lingual manipulation;Delayed oral transit Oral - Nectar Cup -- Oral - Nectar Straw -- Oral - Thin Teaspoon Lingual pumping;Weak lingual manipulation;Holding of bolus;Delayed oral transit Oral - Thin Cup -- Oral - Thin Straw -- Oral - Puree Weak lingual manipulation;Holding of bolus;Lingual/palatal residue;Decreased bolus  cohesion;Delayed oral transit Oral - Mech Soft -- Oral - Regular -- Oral - Multi-Consistency -- Oral - Pill -- Oral Phase - Comment --  CHL IP PHARYNGEAL PHASE 04/14/2016 Pharyngeal Phase Impaired Pharyngeal- Pudding Teaspoon -- Pharyngeal -- Pharyngeal- Pudding Cup -- Pharyngeal -- Pharyngeal- Honey Teaspoon -- Pharyngeal -- Pharyngeal- Honey Cup -- Pharyngeal -- Pharyngeal- Nectar Teaspoon Delayed swallow initiation-vallecula;Reduced tongue base retraction;Pharyngeal residue - cp segment;Pharyngeal residue - valleculae;Pharyngeal residue - pyriform;Penetration/Aspiration during swallow;Penetration/Apiration after swallow Pharyngeal Material enters airway, passes BELOW cords without attempt by patient to eject out (silent aspiration) Pharyngeal- Nectar Cup -- Pharyngeal -- Pharyngeal- Nectar Straw -- Pharyngeal -- Pharyngeal- Thin Teaspoon Delayed swallow initiation-vallecula;Penetration/Aspiration during swallow;Penetration/Apiration after swallow;Reduced tongue base retraction;Pharyngeal residue - pyriform;Pharyngeal residue - valleculae;Pharyngeal residue - cp segment Pharyngeal Material enters airway, passes BELOW cords without attempt by patient to eject out (silent aspiration);Material enters airway, passes BELOW cords then ejected out Pharyngeal- Thin Cup -- Pharyngeal -- Pharyngeal- Thin Straw -- Pharyngeal -- Pharyngeal- Puree Penetration/Apiration after swallow;Delayed swallow initiation-vallecula;Pharyngeal residue - valleculae;Pharyngeal residue - cp segment;Pharyngeal residue - posterior pharnyx;Penetration/Aspiration during swallow;Reduced tongue base retraction Pharyngeal Material enters airway, passes BELOW cords without attempt by patient to eject out (silent aspiration) Pharyngeal- Mechanical Soft -- Pharyngeal -- Pharyngeal- Regular -- Pharyngeal -- Pharyngeal- Multi-consistency -- Pharyngeal -- Pharyngeal- Pill -- Pharyngeal -- Pharyngeal Comment --  CHL IP CERVICAL ESOPHAGEAL PHASE 04/14/2016  Cervical Esophageal Phase Impaired Pudding Teaspoon -- Pudding Cup -- Honey Teaspoon -- Honey Cup -- Nectar Teaspoon Reduced cricopharyngeal relaxation Nectar Cup -- Nectar Straw -- Thin Teaspoon Reduced cricopharyngeal relaxation Thin Cup -- Thin Straw -- Puree Reduced cricopharyngeal relaxation Mechanical Soft -- Regular -- Multi-consistency -- Pill -- Cervical Esophageal Comment -- Deneise Lever, MS CF-SLP Speech-Language Pathologist [phone] No flowsheet data found. Aliene Altes 04/14/2016, 2:21 PM              Dg C-arm 1-60 Min-no Report  Result Date: 03/29/2016 There is no Radiologist interpretation  for this exam.  Dg Hip Operative Unilat With Pelvis Right  Result Date: 03/29/2016 CLINICAL DATA:  Right hip pending EXAM: OPERATIVE RIGHT HIP (WITH PELVIS IF PERFORMED) 2 VIEWS TECHNIQUE: Fluoroscopic spot image(s) were submitted for interpretation post-operatively. COMPARISON:  March 27, 2016 FINDINGS: THREE SCREWS NOW TRAVERSE THE RIGHT HIP FRACTURE. IMPRESSION: APPROPRIATE PINNING OF RIGHT HIP FRACTURE. Electronically Signed   By: Dorise Bullion III M.D   On: 03/29/2016 13:58   Dg Hip Unilat W Or Wo Pelvis 2-3 Views Right  Result Date: 03/27/2016 CLINICAL DATA:  Fall.  Right hip pain. EXAM: DG HIP (WITH OR WITHOUT PELVIS) 2-3V RIGHT COMPARISON:  None. FINDINGS: The bones appear diffusely osteopenic. An impacted subcapital right femoral neck fracture is identified.  The right femoral head appears located. Previous left hip arthroplasty. IMPRESSION: 1. Impacted right subcapital femoral neck fracture. Electronically Signed   By: Kerby Moors M.D.   On: 03/27/2016 19:04    Time Spent in minutes  25   Louellen Molder M.D on 04/16/2016 at 1:53 PM  Between 7am to 7pm - Pager - [phone]  After 7pm go to [email or web address] - password [facility]  [facility] -  Office  [phone]

## 2016-04-16 NOTE — Progress Notes (Addendum)
LCSW has reviewed case with NP and due to patient unable to eat and prognosis, family and team has decided to move to full comfort and request [facility] for hospice care.  LCSW has made referral to [facility] for review. Currently there are no beds for today, however referral will still be worked up and LCSW will follow up with [facility].  Patient is originally from SNF: [facility] and will not be returning.   If still no beds, will explore other options with family.  Lane Hacker, MSW Clinical Social Work: [employer] Coverage for :  [phone]

## 2016-04-16 NOTE — Progress Notes (Signed)
Daily Progress Note   Patient Name: [name]       Date: 04/16/2016 DOB: April 15, 1921  Age: 83 y.o. MRN#: [id number] Attending Physician: Louellen Molder, MD Primary Care Physician: Jenny Reichmann, MD Admit Date: 04/13/2016  Reason for Consultation/Follow-up: Establishing goals of care, Inpatient hospice referral, Non pain symptom management, Pain control and Psychosocial/spiritual support  Subjective: - continued conversation with son/[name] regarding decision for shift to full comfort.  He and his family are comfortable with decision and only want comfort and dignity for [name]  -we discussed the fact that the patient has been unable to take any po intake except for a few bites and sips and that he coughs 2/2 to that intake.  Marya Amsler understands the risks of aspiration but is willing and wishes for his father to be offered po intake, with those known aspiration risks.   - family is requesting a hospice facility, they are hopeful for [facility], they are very familiar with, this man's first wife and only a week ago his SO both passed at [facility] of Stay: 3  Current Medications: Scheduled Meds:  . mouth rinse  15 mL Mouth Rinse BID  . metoprolol succinate  25 mg Oral Q breakfast  . sodium chloride flush  3 mL Intravenous Q12H  . tamsulosin  0.4 mg Oral QPC supper    Continuous Infusions:   PRN Meds: acetaminophen **OR** acetaminophen, albuterol, LORazepam, morphine CONCENTRATE, ondansetron **OR** ondansetron (ZOFRAN) IV  Physical Exam  Constitutional: He appears cachectic. He appears ill.  HENT:  Mouth/Throat: Mucous membranes are dry.  Cardiovascular: Normal rate, regular rhythm and normal heart sounds.   Pulmonary/Chest: He has decreased breath sounds in the right  lower field and the left lower field. He has rhonchi.  Abdominal: Soft. Normal appearance.  Skin: Skin is warm and dry.            Vital Signs: BP (!) 95/51 (BP Location: Left Arm)   Pulse 70   Temp (!) 93.6 F (34.2 C) (Axillary)   Resp 18   Ht 5\' 5"  (1.651 m)   Wt 61.2 kg (135 lb)   SpO2 90%   BMI 22.47 kg/m  SpO2: SpO2: 90 % O2 Device: O2 Device: Not Delivered O2 Flow Rate: O2 Flow Rate (L/min): 15 L/min  Intake/output  summary:  Intake/Output Summary (Last 24 hours) at 04/16/16 0835 Last data filed at 04/15/16 1818  Gross per 24 hour  Intake              118 ml  Output             1000 ml  Net             -882 ml   LBM: Last BM Date: 04/15/16 Baseline Weight: Weight: 61.2 kg (135 lb) Most recent weight: Weight: 61.2 kg (135 lb)       Palliative Assessment/Data:  20 %    Flowsheet Rows   Flowsheet Row Most Recent Value  Intake Tab  Referral Department  Hospitalist  Unit at Time of Referral  ICU  Palliative Care Primary Diagnosis  Sepsis/Infectious Disease  Date Notified  04/14/16  Palliative Care Type  New Palliative care  Reason for referral  Clarify Goals of Care  Date of Admission  04/13/16  Date first seen by Palliative Care  04/15/16  # of days Palliative referral response time  1 Day(s)  # of days IP prior to Palliative referral  1  Clinical Assessment  Palliative Performance Scale Score  30%  Psychosocial & Spiritual Assessment  Palliative Care Outcomes  Patient/Family meeting held?  Yes      Patient Active Problem List   Diagnosis Date Noted  . Pressure injury of skin 04/15/2016  . Palliative care by specialist 04/15/2016  . DNR (do not resuscitate) 04/15/2016  . Adult failure to thrive syndrome   . Acute pulmonary edema ([place]) 04/14/2016  . Respiratory failure ([place]) 04/13/2016  . Closed right hip fracture, initial encounter ([place]) 03/27/2016  . Hip fracture ([place]) 03/27/2016  . Acute on chronic systolic heart failure ([place]) 11/16/2015  .  Hypokalemia 11/16/2015  . Hard of hearing 11/16/2015  . 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 Cardiomyopathy ([place])   . Acute respiratory failure ([place])   . Small bowel obstruction from bezoar s/p ex lap & evacuation 11/09/2015 11/03/2015  . Intussusception ([place]) 11/03/2015  . Compression fracture of L2 lumbar vertebra ([place]) 03/16/2015  . Abrasion 03/05/2015  . Lipoma of back 03/01/2015  . Paroxysmal atrial fibrillation ([place]) 12/10/2013  . Hyponatremia 12/03/2013  . Leukocytosis, unspecified 12/02/2013  . Femoral neck fracture ([place]) 11/30/2013  . Acute renal failure superimposed on stage 4 chronic kidney disease ([place]) 11/30/2013  . Chronic combined systolic and diastolic CHF (congestive heart failure) ([place]) 11/30/2013  . A-fib ([place]) 07/11/2013  . Protein-calorie malnutrition, severe ([place]) 07/10/2013  . Acute encephalopathy 07/10/2013  . CAP (community acquired pneumonia) 07/09/2013  . CKD (chronic kidney disease) stage 2, GFR 60-89 ml/min 07/09/2013  . Metabolic acidosis [id number]  . Septic shock ([place]) 07/09/2013  . BPH (benign prostatic hyperplasia) 02/04/2012  . MELENA 08/01/2009  . TINEA CORPORIS 06/27/2009  . Hyperlipemia 06/27/2009  . TREMOR, ESSENTIAL 06/27/2009  . Essential hypertension 06/27/2009  . TOBACCO USE, QUIT 06/27/2009  . PERSONAL HX COLONIC POLYPS 02/01/2009  . DIARRHEA 01/31/2009  . GERD 11/19/2007  . BARRETTS ESOPHAGUS 11/19/2007  . GASTRITIS 11/19/2007  . HIATAL HERNIA 11/19/2007  . DIVERTICULOSIS, COLON 11/19/2007    Palliative Care Assessment & Plan    Assessment: -continued physical decline 2/2 to overall failure to thrive, dysphagia significant, scant po intake and coughing on attempts (no desire for artificial feeding or hydration)  Recommendations/Plan:  Full comfort, access a hospice facility CRF, ARF, sepsis, CHF, hypotension and overall failure  to thrive.  Goals of Care and Additional  Recommendations:  Limitations on Scope of Treatment: Full Comfort Care  Code Status:    Code Status Orders        Start     Ordered   04/13/16 2035  Do not attempt resuscitation (DNR)  Continuous    Question Answer Comment  In the event of cardiac or respiratory ARREST Do not call a "code blue"   In the event of cardiac or respiratory ARREST Do not perform Intubation, CPR, defibrillation or ACLS   In the event of cardiac or respiratory ARREST Use medication by any route, position, wound care, and other measures to relive pain and suffering. May use oxygen, suction and manual treatment of airway obstruction as needed for comfort.      04/13/16 2035    Code Status History    Date Active Date Inactive Code Status Order ID Comments User Context   03/27/2016 11:49 PM 03/30/2016  7:15 PM Full Code [id number]  Roney Jaffe, MD Inpatient   11/09/2015  5:31 PM 11/16/2015  7:25 PM Full Code [id number]  [name], MD Inpatient   11/03/2015  5:39 PM 11/09/2015  5:31 PM Full Code [id number]  [name], MD ED   11/30/2013  6:50 PM 12/04/2013  7:21 PM Full Code [id number]  [name], MD Inpatient   11/30/2013 12:46 PM 11/30/2013  6:50 PM Full Code [id number]  [name], MD Inpatient   07/09/2013  1:13 AM 07/15/2013  5:00 PM Full Code [id number]  [name], MD Inpatient    Advance Directive Documentation   Flowsheet Row Most Recent Value  Type of Advance Directive  Healthcare Power of Attorney  Pre-existing out of facility DNR order (yellow form or pink MOST form)  Yellow form placed in chart (order not valid for inpatient use), Physician notified to receive inpatient order  "MOST" Form in Place?  No data       Prognosis:   < 2 weeks  Discharge Planning:  Hospice facility  Care plan was discussed with family and Dr Clementeen Graham  Thank you for allowing the Palliative Medicine Team to assist in the care of this patient.   Time In: 0900 Time Out: 0935 Total Time 35 min Prolonged Time Billed   no       Greater than 50%  of this time was spent counseling and coordinating care related to the above assessment and plan.  Wadie Lessen, NP  Please contact Palliative Medicine Team phone at [phone] for questions and concerns.

## 2016-04-16 NOTE — Progress Notes (Signed)
SLP Cancellation Note  Patient Details Name: [name] MRN: [id number] DOB: Jul 29, 1921   Cancelled treatment:       Reason Eval/Treat Not Completed: Other (comment) (Palliative/nursing discussed aspiration risks with son); ST will s/o at this time; family informed of aspiration risks and want to continue po intake despite aspiration risks; nursing stated after she provided po intake today, pt exhibited immediate cough and required suctioning. Recommend NPO, but family wants po intake to continue; no family available for SLP to provide further education re: aspiration risk.   ADAMS,PAT, M.S., CCC-SLP 04/16/2016, 4:08 PM

## 2016-04-16 NOTE — Consult Note (Signed)
   [facility] Inpatient Consult   04/16/2016  [name] [birth year] [id number]  Patient's chart reviewed for [facility] Medicare Registry for re-admission and HF.  Chart review reveals the patient's family is seeking Hospice care post hospital.  There are no appropriate [facility] Care Management follow up needed per chart review as the patient will have full care management services with Hospice and likely at a Hospice facility, if no bed available then skilled facility.  Spoke with inpatient RNCM as to the disposition and no THN needs noted. For questions, please contact:  Natividad Brood, RN BSN [facility] Liaison  [phone] business mobile phone Toll free office [phone]

## 2016-04-17 DIAGNOSIS — J9621 Acute and chronic respiratory failure with hypoxia: Secondary | ICD-10-CM

## 2016-04-17 MED ORDER — MORPHINE SULFATE (CONCENTRATE) 10 MG/0.5ML PO SOLN: 5.0000 mg | ORAL | 0 refills | Status: AC | PRN

## 2016-04-17 MED ORDER — LORAZEPAM 1 MG PO TABS: 1.0000 mg | ORAL_TABLET | ORAL | 0 refills | Status: AC | PRN

## 2016-04-17 MED ORDER — ONDANSETRON HCL 4 MG PO TABS: 4.0000 mg | ORAL_TABLET | Freq: Four times a day (QID) | ORAL | 0 refills | Status: AC | PRN

## 2016-04-17 NOTE — Care Management Note (Signed)
Case Management Note Marvetta Gibbons RN, BSN Unit 2W-Case Manager [phone]  Patient Details  Name: [name] MRN: [id number] Date of Birth: 29-Mar-1921  Subjective/Objective:  Pt admitted with resp. failure                 Action/Plan: PTA pt was at [facility], [facility] consulted for [place]- pt now comfort care and plan to d/c to [facility]-   Expected Discharge Date:  04/17/16               Expected Discharge Plan:  [place]  In-House Referral:  Clinical Social Work  Discharge planning Services  CM Consult  Post Acute Care Choice:  NA Choice offered to:  NA  DME Arranged:    DME Agency:     HH Arranged:    [place] Agency:     Status of Service:  Completed, signed off  If discussed at [employer] of Stay Meetings, dates discussed:    Discharge Disposition: [place]   Additional Comments:  04/17/16- [place] RN, CM- notified this AM that [facility] would have a bed for pt today- CSW following for transition to residential Hospice- plan to d/c later today to [facility].   Dawayne Patricia, RN 04/17/2016, 11:59 AM

## 2016-04-17 NOTE — Care Management Important Message (Signed)
Important Message  Patient Details  Name: [name] MRN: [id number] Date of Birth: [birth year]   Medicare Important Message Given:  Yes    Nathen May 04/17/2016, 12:43 PM

## 2016-04-17 NOTE — Discharge Summary (Addendum)
Physician Discharge Summary  [name] [id number] DOB: 1921/10/08 DOA: 04/13/2016  PCP: Sean Reichmann, MD  Admit date: 04/13/2016 Discharge date: 04/17/2016  Admitted From: Home Disposition:  Residential hospice  Recommendations for Outpatient Follow-up:  Discharged with his insulin hospice for full comfort    Discharge Condition: Guarded CODE STATUS: DO NOT RESUSCITATE Diet recommendation: Comfort feeds if tolerated    Discharge Diagnoses:  Principal Problem:   Acute on chronic respiratory failure with hypoxia ([place])  Active Problems:   Sepsis ([place])   CKD (chronic kidney disease) stage 2, GFR 60-89 ml/min   Acute renal failure superimposed on stage 4 chronic kidney disease (HCC)   Acute on chronic systolic heart failure (HCC)   Acute pulmonary edema (HCC)   Pressure injury of skin   Palliative care by specialist   DNR (do not resuscitate)   Adult failure to thrive syndrome   Dysphagia, pharyngoesophageal phase  Brief narrative status history of present illness 83 y.o.malethe past medical history significant for A. fib, CK D, reflux, hypertension, IBS, intussusception was sent to the ED from skilled nursing facility for shortness of breath and hypoxia. Patient signed to be septic with healthcare associated pneumonia/URI with acute hypoxic respiratory failure. He also had acute on chronic any disease along with acute on chronic systolic CHF. He was admitted to stepdown unit and given significant decline palliative care was consulted. After discussing with family patient was made for comfort with plan for residential hospice.  Hospital course Sepsis due to URI / RSV infection / reported HCAP Patient febrile and hypotensive and with end organ damage. Significant decline overall. Now made full comfort. Added oral morphine sulfate for comfort and shortness of breath. Added when necessary Ativan for anxiety and sleep. To be discharged to [facility]  for residential  hospice.  Acute Respiratory failure with hypoxia  - due to pulmonary edema in the setting of acute on chronic systolic CHF. He received Lasix x 2 on admissionwith significant improvement. Subsequently received IV fluids for dehydration.  AKI on CKD 2-3 - patient with progressive renal failure likely in the setting of hypotension with sepsis.  Nephrology following, not a candidate for HD. Slow improvement in renal function with hydration. No alcohol for comfort.  Acute on chronic systolic CHF - most recent 2D echo in August 2017 with EF 25-35%. S/p Lasix 1/19 pm and fluids overnight for hypotension. Appears euvolemic on exam today. Off all medications with goal for comfort.  GERD-  Discontinue PPI  Hyperlipidemia Discontinue statin  Afib-  Discontinued anticoagulation and beta blocker.  BPH-  Cont flomax  Goals of care- palliative was consulted, patient transition to full comfort. His poor by mouth intake with frequent coughing on attempts. Referral made to residential hospice. Placed on morphine solution as needed for pain and shortness of breath. Ativan as needed for anxiety and sleep.   Family Communication: none at bedside  Disposition Plan:  inpatient hospice.  Discharge Instructions   Allergies as of 04/17/2016      Reactions   Ciprofloxacin Other (See Comments)   Reaction:  Unknown    Penicillins Rash, Other (See Comments)   Tolerated Unasyn 11/03/15 Has patient had a PCN reaction causing immediate rash, facial/tongue/throat swelling, SOB or lightheadedness with hypotension: No Has patient had a PCN reaction causing severe rash involving mucus membranes or skin necrosis: No Has patient had a PCN reaction that required hospitalization No Has patient had a PCN reaction occurring within the last 10 years: No If  all of the above answers are "NO", then may proceed with Cephalosporin use.      Medication List    STOP taking these medications    acidophilus Caps capsule   albuterol (2.5 MG/3ML) 0.083% nebulizer solution Commonly known as:  PROVENTIL   apixaban 5 MG Tabs tablet Commonly known as:  ELIQUIS   doxycycline 100 MG tablet Commonly known as:  VIBRA-TABS   furosemide 20 MG tablet Commonly known as:  LASIX   lisinopril-hydrochlorothiazide 10-12.5 MG tablet Commonly known as:  PRINZIDE,ZESTORETIC   metoprolol succinate 25 MG 24 hr tablet Commonly known as:  TOPROL-XL   omeprazole 20 MG capsule Commonly known as:  PRILOSEC   potassium chloride 10 MEQ tablet Commonly known as:  K-DUR,KLOR-CON   PRESERVISION AREDS 2 Caps   PROCEL Powd   saccharomyces boulardii 250 MG capsule Commonly known as:  FLORASTOR   simvastatin 20 MG tablet Commonly known as:  ZOCOR   tamsulosin 0.4 MG Caps capsule Commonly known as:  FLOMAX   traMADol-acetaminophen 37.5-325 MG tablet Commonly known as:  ULTRACET     TAKE these medications   LORazepam 1 MG tablet Commonly known as:  ATIVAN Take 1 tablet (1 mg total) by mouth every 4 (four) hours as needed for anxiety or sleep.   morphine CONCENTRATE 10 MG/0.5ML Soln concentrated solution Take 0.25 mLs (5 mg total) by mouth every hour as needed for moderate pain or shortness of breath.   ondansetron 4 MG tablet Commonly known as:  ZOFRAN Take 1 tablet (4 mg total) by mouth every 6 (six) hours as needed for nausea.       Allergies  Allergen Reactions  . Ciprofloxacin Other (See Comments)    Reaction:  Unknown   . Penicillins Rash and Other (See Comments)    Tolerated Unasyn 11/03/15 Has patient had a PCN reaction causing immediate rash, facial/tongue/throat swelling, SOB or lightheadedness with hypotension: No Has patient had a PCN reaction causing severe rash involving mucus membranes or skin necrosis: No Has patient had a PCN reaction that required hospitalization No Has patient had a PCN reaction occurring within the last 10 years: No If all of the above answers  are "NO", then may proceed with Cephalosporin use.      Procedures/Studies: Dg Chest 1 View  Result Date: 03/27/2016 CLINICAL DATA:  Chest pain after fall.  Preop. EXAM: CHEST 1 VIEW COMPARISON:  None. FINDINGS: Stable cardiomegaly with aortic atherosclerosis. Diffuse calcified pleural plaque with mild interstitial edema. No pneumonic consolidation or pneumothorax. Chronic degenerative change of both shoulders. IMPRESSION: Stable cardiomegaly with aortic atherosclerosis. Calcified pleural plaque consistent with prior asbestos exposure. Mild interstitial edema. No pneumonic consolidations. Electronically Signed   By: Ashley Royalty M.D.   On: 03/27/2016 19:05   Ct Hip Right Wo Contrast  Result Date: 03/27/2016 CLINICAL DATA:  Right subcapital femoral neck fracture EXAM: CT OF THE RIGHT HIP WITHOUT CONTRAST TECHNIQUE: Multidetector CT imaging of the right hip was performed according to the standard protocol. Multiplanar CT image reconstructions were also generated. COMPARISON:  Same day radiographs from 03/27/2016 FINDINGS: Bones/Joint/Cartilage There is an impacted subcapital right femoral neck fracture along its superolateral aspect. Slight joint space narrowing the native right hip. No acetabular or pubic rami fracture. Ligaments Suboptimally assessed by CT. Muscles and Tendons Nonacute Soft tissues Small joint effusion. No subcutaneous contusion over the lateral aspect of the right hip. IMPRESSION: Superolateral subcapital femoral neck fracture with impaction of the femoral head. No acetabular fracture identified. Electronically Signed  By: Ashley Royalty M.D.   On: 03/27/2016 21:45   Dg Chest Port 1 View  Result Date: 04/13/2016 CLINICAL DATA:  Cough and hypoxia. EXAM: PORTABLE CHEST 1 VIEW COMPARISON:  03/27/2016 FINDINGS: Low volume film. The cardio pericardial silhouette is enlarged. Pulmonary vasculature is congested with diffuse interstitial opacities suggesting edema. The visualized bony structures  of the thorax are intact. Telemetry leads overlie the chest. IMPRESSION: Cardiomegaly with pulmonary edema. Calcified pleural plaques suggest prior asbestos exposure. Electronically Signed   By: Misty Stanley M.D.   On: 04/13/2016 14:26   Dg Swallowing Func-speech Pathology  Result Date: 04/14/2016 Objective Swallowing Evaluation: Type of Study: MBS-Modified Barium Swallow Study Patient Details Name: [name] MRN: [id number] Date of Birth: Jun 17, 1921 Today's Date: 04/14/2016 Time: SLP Start Time (ACUTE ONLY): 1230-SLP Stop Time (ACUTE ONLY): 1248 SLP Time Calculation (min) (ACUTE ONLY): 18 min Past Medical History: Past Medical History: Diagnosis Date . Aspiration pneumonia ([place])  . Atrial fibrillation with RVR ([place])  . BARRETTS ESOPHAGUS 11/19/2007 . Blood transfusion without reported diagnosis  . Cardiomyopathy ([place])  . CHANGE IN BOWELS 02/01/2009 . Chronic combined systolic (congestive) and diastolic (congestive) heart failure   a. echo 2015: EF 45% b. Echo 10/2015: EF 25-30% with diffuse HK . CKD stage G2/A2, GFR 60 - 89 and albumin creatinine ratio 30 - 299 mg/g  . Compression fracture of L2 lumbar vertebra (HCC)  . Diarrhea 01/31/2009 . DIVERTICULOSIS, COLON 11/19/2007 . Gastric bezoar 11/09/2015 . GASTRITIS 11/19/2007 . GERD 11/19/2007 . HIATAL HERNIA 11/19/2007 . HOH (hard of hearing)  . HTN (hypertension)  . HYPERLIPIDEMIA 06/27/2009 . Hypokalemia  . Hypomagnesemia 10/2015 . HYPOTHYROIDISM 06/27/2009 . IBS (irritable bowel syndrome)  . Intussusception ([place])  . MELENA 08/01/2009 . Nonischemic cardiomyopathy ([place])   a. NST in 2010 with no evidence of ischemia. Marland Kitchen PERSONAL HX COLONIC POLYPS 02/01/2009 . Protein-calorie malnutrition, severe ([place])  . TINEA CORPORIS 06/27/2009 . TOBACCO USE, QUIT 06/27/2009 . TREMOR, ESSENTIAL 06/27/2009 Past Surgical History: Past Surgical History: Procedure Laterality Date . BEZOAR EVACUATION  11/09/2015  ILEUM . CHOLECYSTECTOMY   . ESOPHAGOGASTRODUODENOSCOPY N/A 04/28/2013  Procedure:  ESOPHAGOGASTRODUODENOSCOPY (EGD);  Surgeon: Jerene Bears, MD;  Location: [facility];  Service: Gastroenterology;  Laterality: N/A; . HERNIA REPAIR   . HIP ARTHROPLASTY Left 11/30/2013  Procedure: ARTHROPLASTY BIPOLAR HIP;  Surgeon: [name], MD;  Location: [facility];  Service: Orthopedics;  Laterality: Left; . HIP PINNING,CANNULATED Right 03/29/2016  Procedure: CANNULATED HIP PINNING;  Surgeon: Nicholes Stairs, MD;  Location: [facility];  Service: Orthopedics;  Laterality: Right; . LAPAROTOMY N/A 11/09/2015  Procedure: EXPLORATORY LAPAROTOMY;  Surgeon: [name], MD;  Location: [facility];  Service: General;  Laterality: N/A; . LUNG SURGERY  1984 . TONSILLECTOMY   . UMBILICAL HERNIA REPAIR   . VASECTOMY   HPI: Hamid Haser Bowmanis a 83 y.o.malethe past medical history significant for A. fib, CK D, essential tremor, barretts esophagus, GERD, aspiration pneumonia, hypertension, IBS, intussusception presents to emergency room with chief complaint of hypoxia. Admitted with sepsis due to URI, acute respiratory failure with hypoxia, acute on chronic systolic heart failure, acute pulmonary edema. During in prior admission patient was evaluated at bedside by SLP, placed on nectar-thick liquids only 2/2 overt signs of aspiration with thin liquids and pureed. SLP recommended advancement to thin liquids/ dysphagia 3 prior to discharge. Subjective: Alert, oriented to self only Assessment / Plan / Recommendation CHL IP CLINICAL IMPRESSIONS 04/14/2016 Therapy Diagnosis Moderate oral phase dysphagia;Suspected primary esophageal dysphagia;Moderate pharyngeal  phase dysphagia;Severe pharyngeal phase dysphagia Clinical Impression Patient presents with moderate oral and moderately-severe pharyngeal multifactorial dysphagia. Aspiration occurred with thin and nectar thick liquids and pureed solids as a result of severe residual remaining in the pharynx which was penetrated after the swallow and aspirated during subsequent swallows.  Oral stage of swallowing noted for reduced lingual coordination, oral holding, lingual pumping and delayed anterior to posterior transit. Delayed swallow initiation after prolonged pooling of material in the valleculae. Reduced amplitude of UES opening noted resulting in poor bolus flow and incomplete clearance of material, which remained in the CP segment, was penetrated over the arytenoids and eventually aspirated. Patient with inconsistent response to penetration/aspiration; coughed x1 which initially cleared the airway, however material was once again penetrated/aspirated. Cued patient for multiple dry swallows and effortful swallows which did not significantly reduce residue. Patient intermittently follows commands. Recommend remaining NPO at this time as patient is severe aspiration risk due to current cognitive status, hx of pneumonia and esophageal issues, decreased respiratory status. May need temporary alternative means of nutrition/hydration. Patient would benefit from GI evaluation given history of GERD, Barrett's esophagus, and poor clearance of bolus through UES.  Impact on safety and function Severe aspiration risk   CHL IP TREATMENT RECOMMENDATION 04/14/2016 Treatment Recommendations Therapy as outlined in treatment plan below   Prognosis 04/14/2016 Prognosis for Safe Diet Advancement Fair Barriers to Reach Goals Severity of deficits Barriers/Prognosis Comment -- CHL IP DIET RECOMMENDATION 04/14/2016 SLP Diet Recommendations NPO Liquid Administration via -- Medication Administration Via alternative means Compensations -- Postural Changes --   CHL IP OTHER RECOMMENDATIONS 04/14/2016 Recommended Consults Consider GI evaluation;Consider esophageal assessment Oral Care Recommendations Oral care QID Other Recommendations --   CHL IP FOLLOW UP RECOMMENDATIONS 04/14/2016 Follow up Recommendations Skilled Nursing facility   [facility] IP FREQUENCY AND DURATION 04/14/2016 Speech Therapy Frequency (ACUTE ONLY) min 2x/week  Treatment Duration 2 weeks      CHL IP ORAL PHASE 04/14/2016 Oral Phase Impaired Oral - Pudding Teaspoon -- Oral - Pudding Cup -- Oral - Honey Teaspoon -- Oral - Honey Cup -- Oral - Nectar Teaspoon Holding of bolus;Reduced posterior propulsion;Lingual pumping;Weak lingual manipulation;Delayed oral transit Oral - Nectar Cup -- Oral - Nectar Straw -- Oral - Thin Teaspoon Lingual pumping;Weak lingual manipulation;Holding of bolus;Delayed oral transit Oral - Thin Cup -- Oral - Thin Straw -- Oral - Puree Weak lingual manipulation;Holding of bolus;Lingual/palatal residue;Decreased bolus cohesion;Delayed oral transit Oral - Mech Soft -- Oral - Regular -- Oral - Multi-Consistency -- Oral - Pill -- Oral Phase - Comment --  CHL IP PHARYNGEAL PHASE 04/14/2016 Pharyngeal Phase Impaired Pharyngeal- Pudding Teaspoon -- Pharyngeal -- Pharyngeal- Pudding Cup -- Pharyngeal -- Pharyngeal- Honey Teaspoon -- Pharyngeal -- Pharyngeal- Honey Cup -- Pharyngeal -- Pharyngeal- Nectar Teaspoon Delayed swallow initiation-vallecula;Reduced tongue base retraction;Pharyngeal residue - cp segment;Pharyngeal residue - valleculae;Pharyngeal residue - pyriform;Penetration/Aspiration during swallow;Penetration/Apiration after swallow Pharyngeal Material enters airway, passes BELOW cords without attempt by patient to eject out (silent aspiration) Pharyngeal- Nectar Cup -- Pharyngeal -- Pharyngeal- Nectar Straw -- Pharyngeal -- Pharyngeal- Thin Teaspoon Delayed swallow initiation-vallecula;Penetration/Aspiration during swallow;Penetration/Apiration after swallow;Reduced tongue base retraction;Pharyngeal residue - pyriform;Pharyngeal residue - valleculae;Pharyngeal residue - cp segment Pharyngeal Material enters airway, passes BELOW cords without attempt by patient to eject out (silent aspiration);Material enters airway, passes BELOW cords then ejected out Pharyngeal- Thin Cup -- Pharyngeal -- Pharyngeal- Thin Straw -- Pharyngeal -- Pharyngeal- Puree  Penetration/Apiration after swallow;Delayed swallow initiation-vallecula;Pharyngeal residue - valleculae;Pharyngeal residue - cp segment;Pharyngeal residue - posterior  pharnyx;Penetration/Aspiration during swallow;Reduced tongue base retraction Pharyngeal Material enters airway, passes BELOW cords without attempt by patient to eject out (silent aspiration) Pharyngeal- Mechanical Soft -- Pharyngeal -- Pharyngeal- Regular -- Pharyngeal -- Pharyngeal- Multi-consistency -- Pharyngeal -- Pharyngeal- Pill -- Pharyngeal -- Pharyngeal Comment --  CHL IP CERVICAL ESOPHAGEAL PHASE 04/14/2016 Cervical Esophageal Phase Impaired Pudding Teaspoon -- Pudding Cup -- Honey Teaspoon -- Honey Cup -- Nectar Teaspoon Reduced cricopharyngeal relaxation Nectar Cup -- Nectar Straw -- Thin Teaspoon Reduced cricopharyngeal relaxation Thin Cup -- Thin Straw -- Puree Reduced cricopharyngeal relaxation Mechanical Soft -- Regular -- Multi-consistency -- Pill -- Cervical Esophageal Comment -- Deneise Lever, MS CF-SLP Speech-Language Pathologist [phone] No flowsheet data found. Aliene Altes 04/14/2016, 2:21 PM              Dg C-arm 1-60 Min-no Report  Result Date: 03/29/2016 There is no Radiologist interpretation  for this exam.  Dg Hip Operative Unilat With Pelvis Right  Result Date: 03/29/2016 CLINICAL DATA:  Right hip pending EXAM: OPERATIVE RIGHT HIP (WITH PELVIS IF PERFORMED) 2 VIEWS TECHNIQUE: Fluoroscopic spot image(s) were submitted for interpretation post-operatively. COMPARISON:  March 27, 2016 FINDINGS: THREE SCREWS NOW TRAVERSE THE RIGHT HIP FRACTURE. IMPRESSION: APPROPRIATE PINNING OF RIGHT HIP FRACTURE. Electronically Signed   By: Dorise Bullion III M.D   On: 03/29/2016 13:58   Dg Hip Unilat W Or Wo Pelvis 2-3 Views Right  Result Date: 03/27/2016 CLINICAL DATA:  Fall.  Right hip pain. EXAM: DG HIP (WITH OR WITHOUT PELVIS) 2-3V RIGHT COMPARISON:  None. FINDINGS: The bones appear diffusely osteopenic. An impacted  subcapital right femoral neck fracture is identified. The right femoral head appears located. Previous left hip arthroplasty. IMPRESSION: 1. Impacted right subcapital femoral neck fracture. Electronically Signed   By: Kerby Moors M.D.   On: 03/27/2016 19:04       Subjective: More confused today  Discharge Exam: Vitals:   04/16/16 2056 04/17/16 0720  BP: (!) 98/48   Pulse: 72 87  Resp: 18 18  Temp: 97.5 F (36.4 C) 97.8 F (36.6 C)   Vitals:   04/15/16 2347 04/16/16 0603 04/16/16 2056 04/17/16 0720  BP: (!) 89/46 (!) 95/51 (!) 98/48   Pulse: 77 70 72 87  Resp: 18 18 18 18   Temp:  (!) 93.6 F (34.2 C) 97.5 F (36.4 C) 97.8 F (36.6 C)  TempSrc:  Axillary Axillary Oral  SpO2: 94% 90% 93% (!) 89%  Weight:      Height:        Gen: Elderly thin built male not in distress ; confused and hard of hearing HEENT:  dry mucosa, supple neck Chest: clear b/l, no added sounds CVS: N S1&S2, no murmurs GI: soft, NT, ND,  Musculoskeletal: warm, no edema   The results of significant diagnostics from this hospitalization (including imaging, microbiology, ancillary and laboratory) are listed below for reference.     Microbiology: Recent Results (from the past 240 hour(s))  Blood Culture (routine x 2)     Status: None (Preliminary result)   Collection Time: 04/13/16  2:11 PM  Result Value Ref Range Status   Specimen Description BLOOD LEFT ANTECUBITAL  Final   Special Requests IN PEDIATRIC BOTTLE 3CC  Final   Culture NO GROWTH 3 DAYS  Final   Report Status PENDING  Incomplete  Respiratory Panel by PCR     Status: Abnormal   Collection Time: 04/13/16  5:00 PM  Result Value Ref Range Status   Adenovirus NOT  DETECTED NOT DETECTED Final   Coronavirus 229E NOT DETECTED NOT DETECTED Final   Coronavirus HKU1 NOT DETECTED NOT DETECTED Final   Coronavirus NL63 NOT DETECTED NOT DETECTED Final   Coronavirus OC43 NOT DETECTED NOT DETECTED Final   Metapneumovirus NOT DETECTED NOT DETECTED  Final   Rhinovirus / Enterovirus NOT DETECTED NOT DETECTED Final   Influenza A NOT DETECTED NOT DETECTED Final   Influenza B NOT DETECTED NOT DETECTED Final   Parainfluenza Virus 1 NOT DETECTED NOT DETECTED Final   Parainfluenza Virus 2 NOT DETECTED NOT DETECTED Final   Parainfluenza Virus 3 NOT DETECTED NOT DETECTED Final   Parainfluenza Virus 4 NOT DETECTED NOT DETECTED Final   Respiratory Syncytial Virus DETECTED (A) NOT DETECTED Final    Comment: CRITICAL RESULT CALLED TO, READ BACK BY AND VERIFIED WITH: K [name],RN AT [id number] 04/14/16 BY L BENFIELD    Bordetella pertussis NOT DETECTED NOT DETECTED Final   Chlamydophila pneumoniae NOT DETECTED NOT DETECTED Final   Mycoplasma pneumoniae NOT DETECTED NOT DETECTED Final  Blood Culture (routine x 2)     Status: None (Preliminary result)   Collection Time: 04/13/16  7:14 PM  Result Value Ref Range Status   Specimen Description BLOOD LEFT HAND  Final   Special Requests IN PEDIATRIC BOTTLE 1.5 CC  Final   Culture NO GROWTH 3 DAYS  Final   Report Status PENDING  Incomplete  Urine culture     Status: None   Collection Time: 04/13/16  9:38 PM  Result Value Ref Range Status   Specimen Description URINE, RANDOM  Final   Special Requests NONE  Final   Culture NO GROWTH  Final   Report Status 04/15/2016 FINAL  Final     Labs: BNP (last 3 results) No results for input(s): BNP in the last 8760 hours. Basic Metabolic Panel:  Recent Labs Lab 04/13/16 1411 04/14/16 0209 04/15/16 0239  NA 137 140 142  K 4.4 4.7 3.9  CL 105 111 112*  CO2 16* 14* 17*  GLUCOSE 132* 97 80  BUN 144* 134* 115*  CREATININE 7.68* 6.35* 4.45*  CALCIUM 8.7* 8.8* 8.9   Liver Function Tests:  Recent Labs Lab 04/13/16 1411  AST 15  ALT 10*  ALKPHOS 109  BILITOT 0.8  PROT 6.5  ALBUMIN 2.9*   No results for input(s): LIPASE, AMYLASE in the last 168 hours. No results for input(s): AMMONIA in the last 168 hours. CBC:  Recent Labs Lab 04/13/16 1411  04/14/16 0209  WBC 9.0 9.0  NEUTROABS 6.9  --   HGB 12.6* 11.9*  HCT 36.6* 35.3*  MCV 85.5 86.3  PLT 415* 374   Cardiac Enzymes: No results for input(s): CKTOTAL, CKMB, CKMBINDEX, TROPONINI in the last 168 hours. BNP: Invalid input(s): POCBNP CBG: No results for input(s): GLUCAP in the last 168 hours. D-Dimer No results for input(s): DDIMER in the last 72 hours. Hgb A1c No results for input(s): HGBA1C in the last 72 hours. Lipid Profile No results for input(s): CHOL, HDL, LDLCALC, TRIG, CHOLHDL, LDLDIRECT in the last 72 hours. Thyroid function studies No results for input(s): TSH, T4TOTAL, T3FREE, THYROIDAB in the last 72 hours.  Invalid input(s): FREET3 Anemia work up No results for input(s): VITAMINB12, FOLATE, FERRITIN, TIBC, IRON, RETICCTPCT in the last 72 hours. Urinalysis    Component Value Date/Time   COLORURINE YELLOW 04/13/2016 2138   APPEARANCEUR CLEAR 04/13/2016 2138   LABSPEC 1.011 04/13/2016 2138   PHURINE 5.0 04/13/2016 2138   GLUCOSEU NEGATIVE 04/13/2016  2138   HGBUR NEGATIVE 04/13/2016 2138   BILIRUBINUR NEGATIVE 04/13/2016 2138   BILIRUBINUR neg 08/18/2013 0848   KETONESUR NEGATIVE 04/13/2016 2138   PROTEINUR NEGATIVE 04/13/2016 2138   UROBILINOGEN 0.2 11/30/2013 0804   NITRITE NEGATIVE 04/13/2016 2138   LEUKOCYTESUR NEGATIVE 04/13/2016 2138   Sepsis Labs Invalid input(s): PROCALCITONIN,  WBC,  LACTICIDVEN Microbiology Recent Results (from the past 240 hour(s))  Blood Culture (routine x 2)     Status: None (Preliminary result)   Collection Time: 04/13/16  2:11 PM  Result Value Ref Range Status   Specimen Description BLOOD LEFT ANTECUBITAL  Final   Special Requests IN PEDIATRIC BOTTLE 3CC  Final   Culture NO GROWTH 3 DAYS  Final   Report Status PENDING  Incomplete  Respiratory Panel by PCR     Status: Abnormal   Collection Time: 04/13/16  5:00 PM  Result Value Ref Range Status   Adenovirus NOT DETECTED NOT DETECTED Final   Coronavirus 229E  NOT DETECTED NOT DETECTED Final   Coronavirus HKU1 NOT DETECTED NOT DETECTED Final   Coronavirus NL63 NOT DETECTED NOT DETECTED Final   Coronavirus OC43 NOT DETECTED NOT DETECTED Final   Metapneumovirus NOT DETECTED NOT DETECTED Final   Rhinovirus / Enterovirus NOT DETECTED NOT DETECTED Final   Influenza A NOT DETECTED NOT DETECTED Final   Influenza B NOT DETECTED NOT DETECTED Final   Parainfluenza Virus 1 NOT DETECTED NOT DETECTED Final   Parainfluenza Virus 2 NOT DETECTED NOT DETECTED Final   Parainfluenza Virus 3 NOT DETECTED NOT DETECTED Final   Parainfluenza Virus 4 NOT DETECTED NOT DETECTED Final   Respiratory Syncytial Virus DETECTED (A) NOT DETECTED Final    Comment: CRITICAL RESULT CALLED TO, READ BACK BY AND VERIFIED WITH: Jimmy Footman AT [id number] 04/14/16 BY L BENFIELD    Bordetella pertussis NOT DETECTED NOT DETECTED Final   Chlamydophila pneumoniae NOT DETECTED NOT DETECTED Final   Mycoplasma pneumoniae NOT DETECTED NOT DETECTED Final  Blood Culture (routine x 2)     Status: None (Preliminary result)   Collection Time: 04/13/16  7:14 PM  Result Value Ref Range Status   Specimen Description BLOOD LEFT HAND  Final   Special Requests IN PEDIATRIC BOTTLE 1.5 CC  Final   Culture NO GROWTH 3 DAYS  Final   Report Status PENDING  Incomplete  Urine culture     Status: None   Collection Time: 04/13/16  9:38 PM  Result Value Ref Range Status   Specimen Description URINE, RANDOM  Final   Special Requests NONE  Final   Culture NO GROWTH  Final   Report Status 04/15/2016 FINAL  Final     Time coordinating discharge: Over 30 minutes  SIGNED:   Louellen Molder, MD  [facility] 04/17/2016, 11:02 AM Pager   If 7PM-7AM, please contact night-coverage [email or web address] Password [email or web address]

## 2016-04-17 NOTE — Progress Notes (Signed)
Pt prepared d/c to [facility]. IV dc'd. Skin intact except as charted in most recent assessment. Vitals are stable. Report called to receiving facility. Pt to be transported by ambulance service.

## 2016-04-17 NOTE — Clinical Social Work Note (Signed)
CSW facilitated patient discharge including contacting patient family and facility to confirm patient discharge plans. Clinical information faxed to facility and family agreeable with plan. CSW arranged ambulance transport via [facility] to [facility]. RN to call report prior to discharge ([phone]).  CSW will sign off for now as social work intervention is no longer needed. Please consult us again if new needs arise.  Estefan Pattison, CSW [phone]  

## 2016-04-18 LAB — CULTURE, BLOOD (ROUTINE X 2)
CULTURE: NO GROWTH
Culture: NO GROWTH

## 2016-04-26 DEATH — deceased

## 2016-08-27 NOTE — Addendum Note (Signed)
Addendum  created 08/27/16 0951 by Myrtie Soman, MD   Sign clinical note

## 2018-07-18 IMAGING — DX DG CHEST 1V PORT
1 series · 1 of 1 positions shown · non-contrast
Comparison: Chest radiograph performed 02/10/2014

CLINICAL DATA: Nasogastric tube placement.  Initial encounter.

EXAM:
PORTABLE CHEST 1 VIEW

[chest ap]
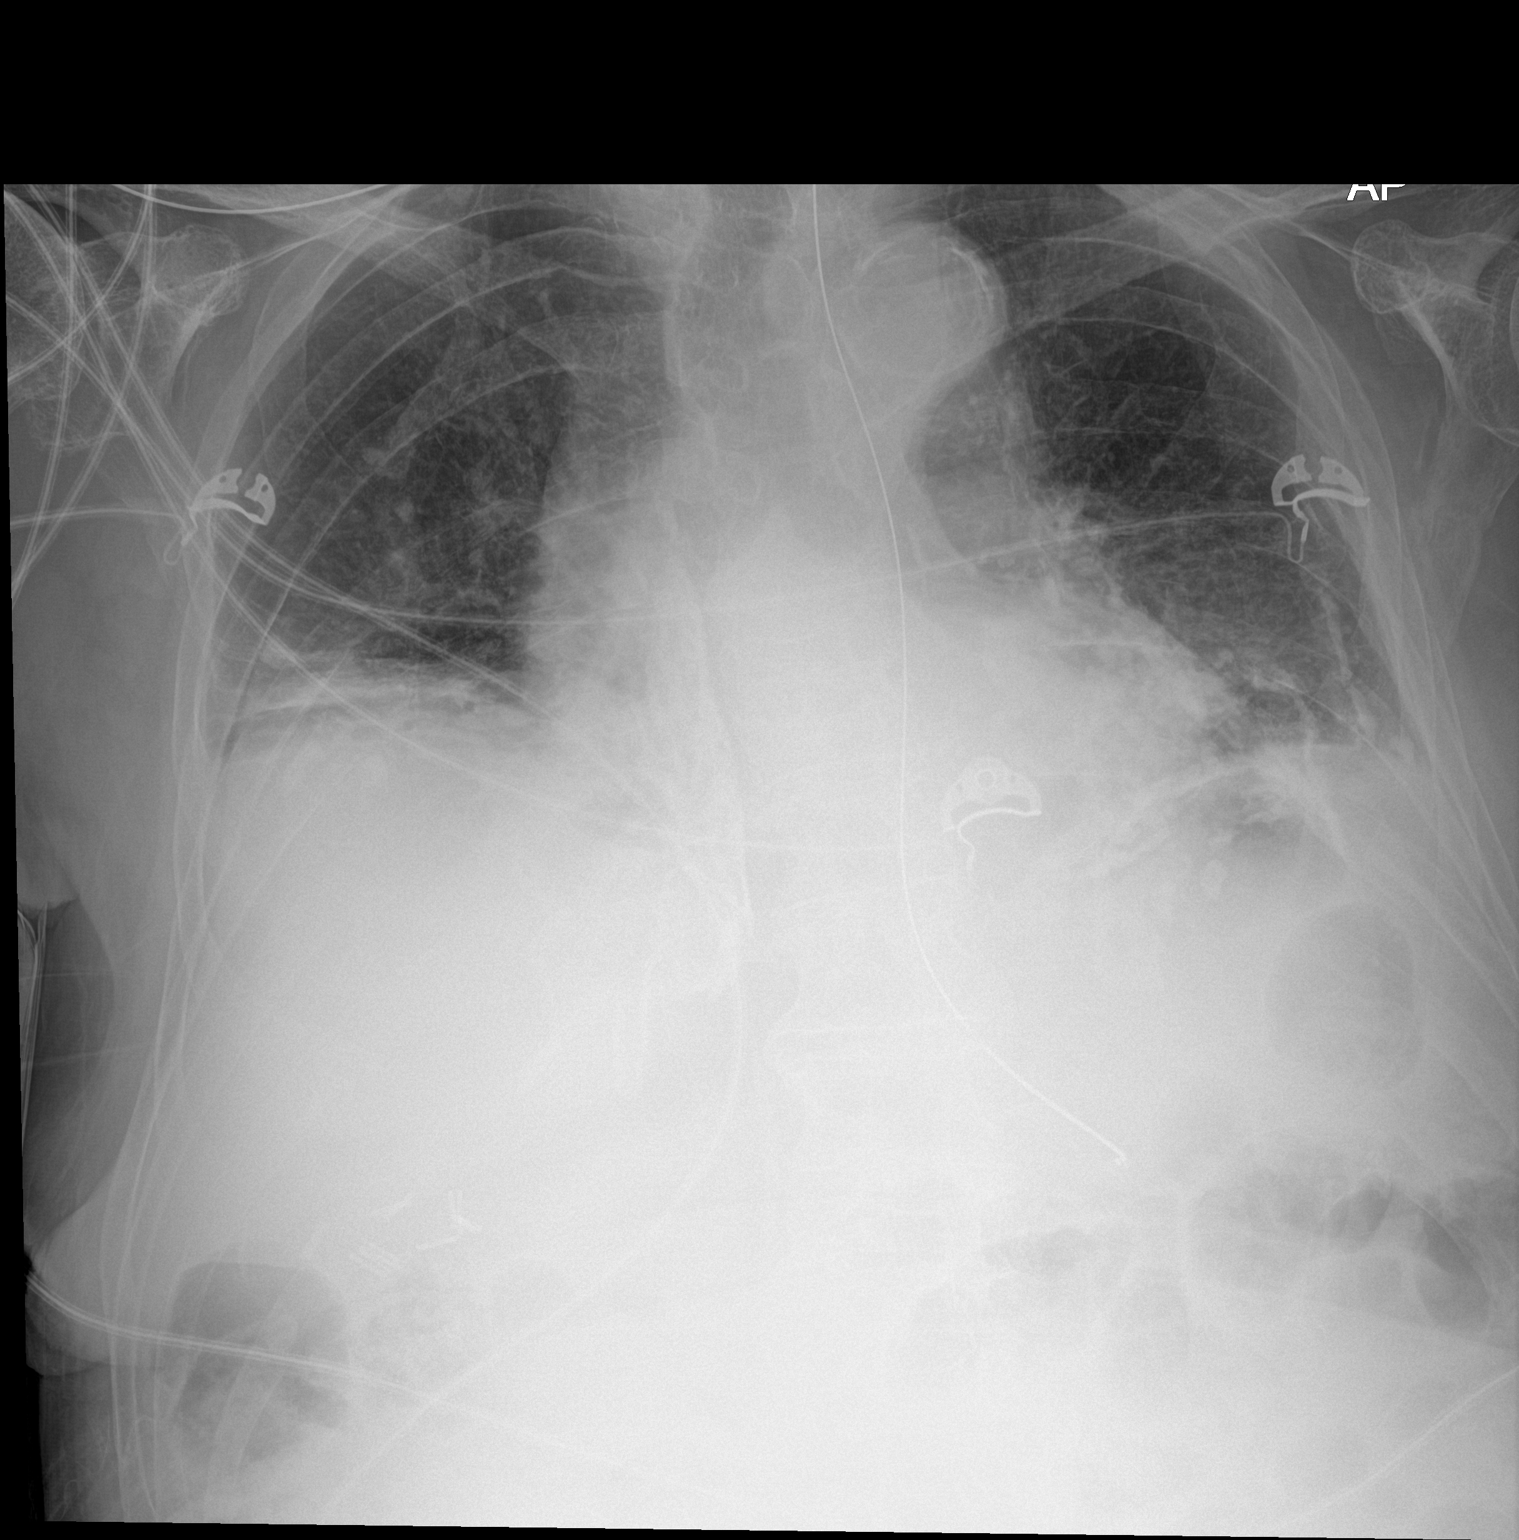

[1 of 1 positions shown; findings below may reference images not displayed]

FINDINGS: The patient's enteric tube is seen ending overlying the body of the
stomach, with the side port about the gastroesophageal junction.
This could be advanced several centimeters, as deemed clinically
appropriate.

The lungs are hypoexpanded. Bibasilar airspace opacities may reflect
atelectasis or pneumonia. Small bilateral pleural effusions are
suspected. No pneumothorax is seen.

The cardiomediastinal silhouette is borderline normal in size. No
acute osseous abnormalities are identified. Clips are noted within
the right upper quadrant, reflecting prior cholecystectomy.
IMPRESSION: 1. Enteric tube noted ending overlying the body of the stomach, with
the side port about the gastroesophageal junction. This could be
advanced several centimeters, as deemed clinically appropriate.
2. Lungs hypoexpanded. Bibasilar airspace opacities may reflect
atelectasis or pneumonia. Small bilateral pleural effusions
suspected.

## 2018-07-18 IMAGING — DX DG ABDOMEN 2V
4 series · 4 of 4 positions shown · non-contrast
Comparison: 11/03/2015

CLINICAL DATA: Check nasogastric catheter placement

EXAM:
ABDOMEN - 2 VIEW

[abdomen supine (1 of 2)]
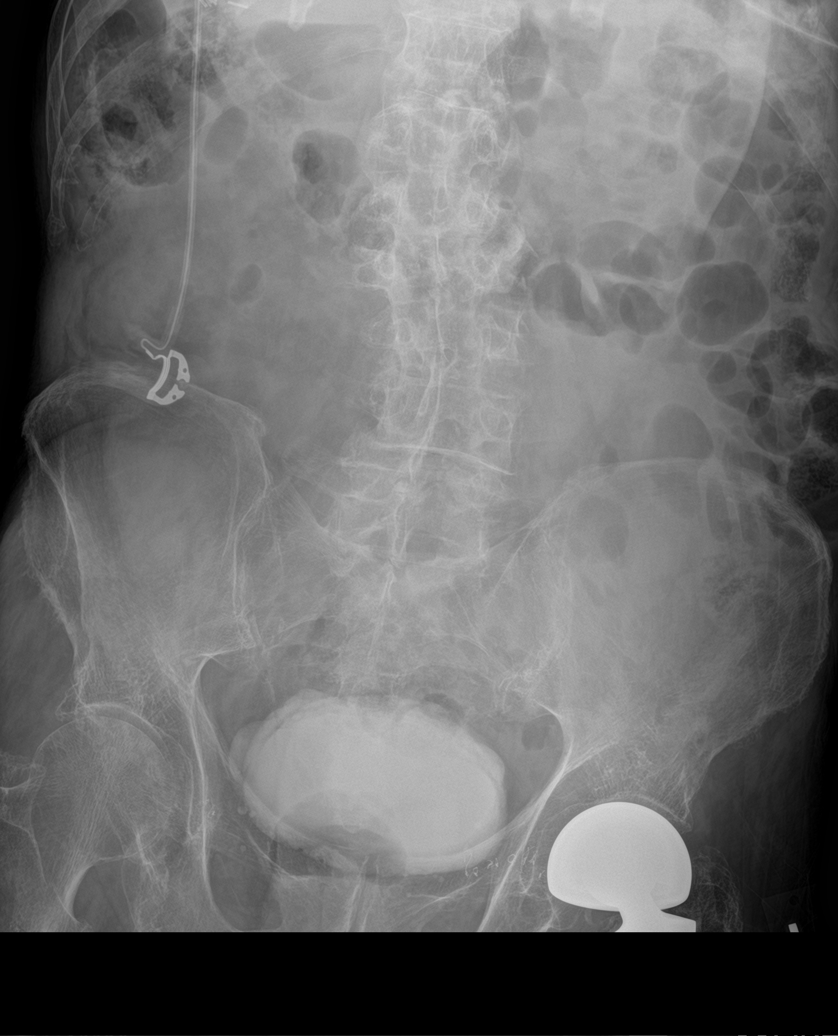

[abdomen supine (2 of 2)]
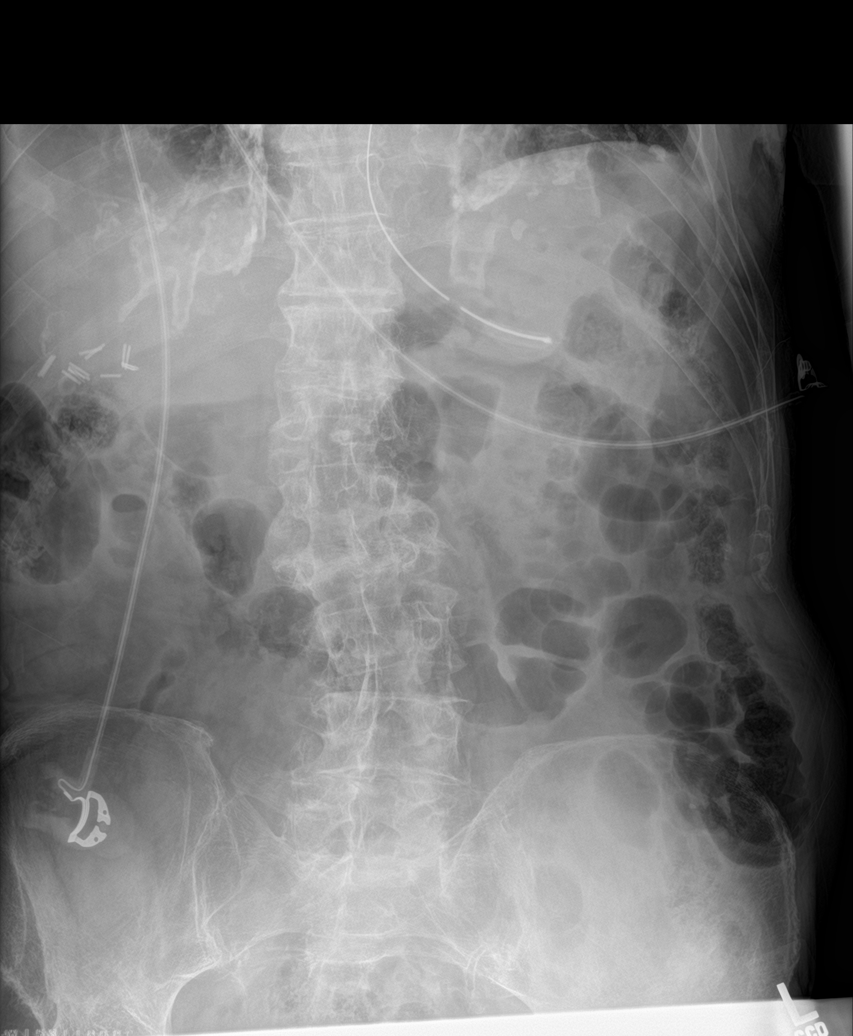

[abdomen decu (1 of 2)]
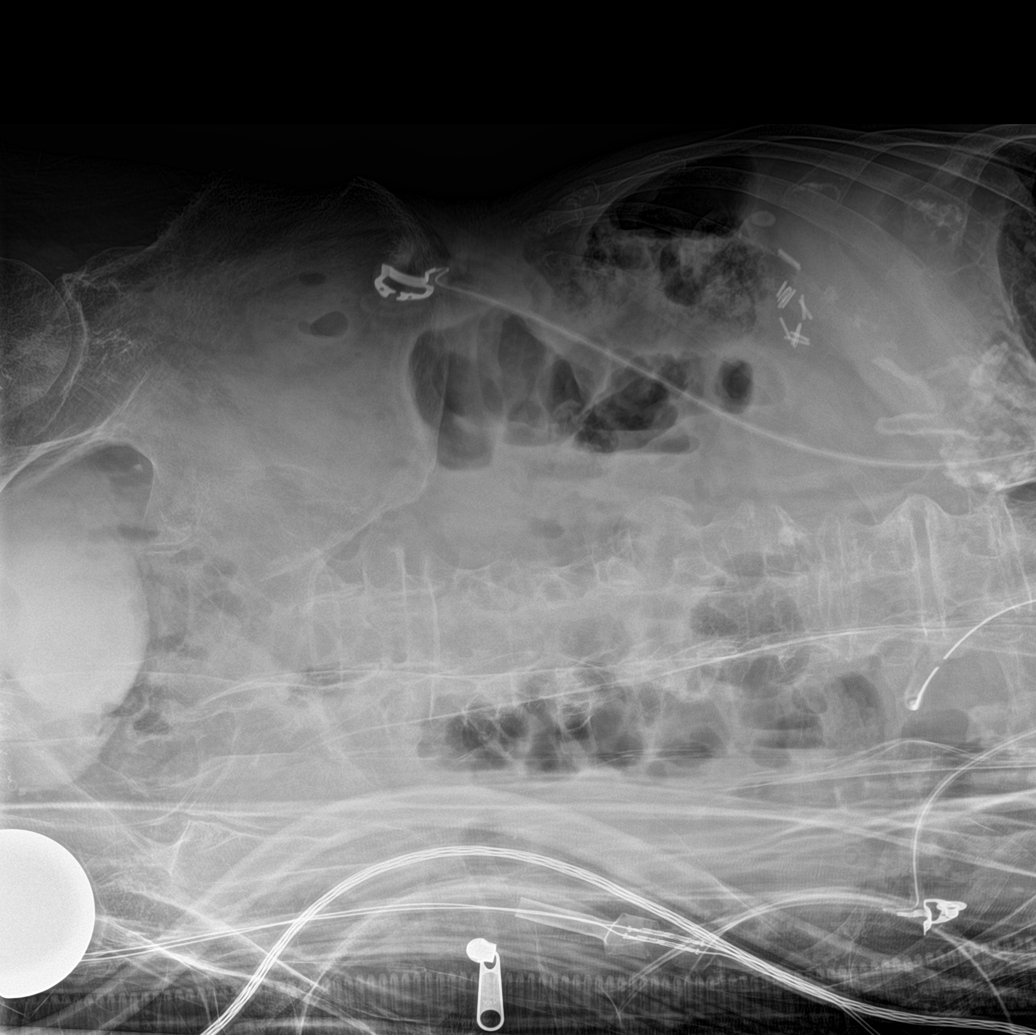

[abdomen decu (2 of 2)]
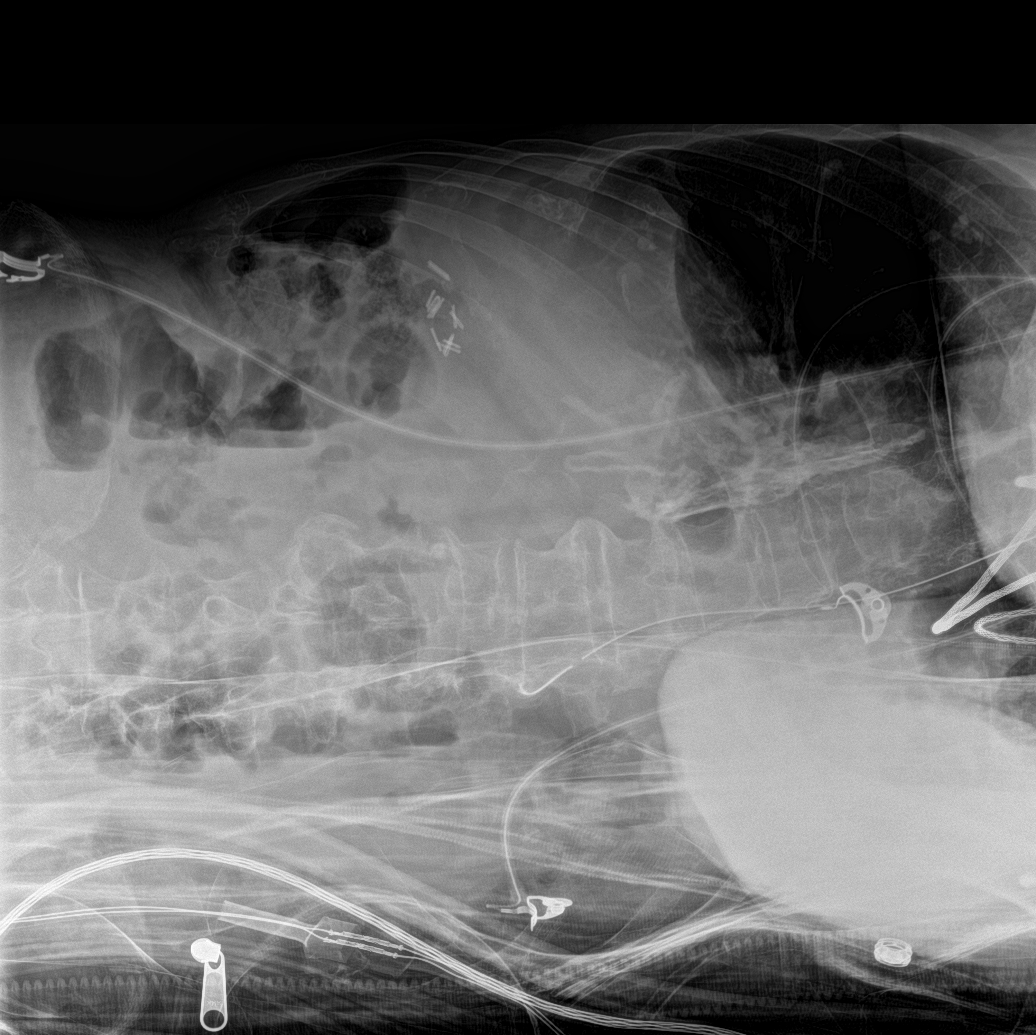

[4 of 4 positions shown; findings below may reference images not displayed]

FINDINGS: Scattered large and small bowel gas is noted. No obstructive changes
seen. Bladder is well distended with contrast opacified urine. Left
hip replacement is noted. A nasogastric catheter is noted within the
stomach just beyond the gastroesophageal junction. No free air is
seen. Pleural plaques are noted bilaterally.
IMPRESSION: Nasogastric catheter within the stomach. The degree of small bowel
dilatation has improved from the prior CT examination.
